# Patient Record
Sex: Male | Born: 1950 | Race: White | Hispanic: No | Marital: Married | Smoking: Former smoker
Health system: Southern US, Community
[De-identification: ages and names within clinical notes are randomized; demographics above are authoritative.]

## PROBLEM LIST (undated history)

## (undated) DIAGNOSIS — C44202 Unspecified malignant neoplasm of skin of right ear and external auricular canal: Secondary | ICD-10-CM

## (undated) DIAGNOSIS — G8929 Other chronic pain: Secondary | ICD-10-CM

## (undated) DIAGNOSIS — E669 Obesity, unspecified: Secondary | ICD-10-CM

## (undated) DIAGNOSIS — I4510 Unspecified right bundle-branch block: Secondary | ICD-10-CM

## (undated) DIAGNOSIS — L309 Dermatitis, unspecified: Secondary | ICD-10-CM

## (undated) DIAGNOSIS — T753XXA Motion sickness, initial encounter: Secondary | ICD-10-CM

## (undated) DIAGNOSIS — R011 Cardiac murmur, unspecified: Secondary | ICD-10-CM

## (undated) DIAGNOSIS — I10 Essential (primary) hypertension: Secondary | ICD-10-CM

## (undated) DIAGNOSIS — G2581 Restless legs syndrome: Secondary | ICD-10-CM

## (undated) DIAGNOSIS — M5134 Other intervertebral disc degeneration, thoracic region: Secondary | ICD-10-CM

## (undated) DIAGNOSIS — G4733 Obstructive sleep apnea (adult) (pediatric): Secondary | ICD-10-CM

## (undated) DIAGNOSIS — Z974 Presence of external hearing-aid: Secondary | ICD-10-CM

## (undated) DIAGNOSIS — G629 Polyneuropathy, unspecified: Secondary | ICD-10-CM

## (undated) DIAGNOSIS — G5600 Carpal tunnel syndrome, unspecified upper limb: Secondary | ICD-10-CM

## (undated) DIAGNOSIS — J309 Allergic rhinitis, unspecified: Secondary | ICD-10-CM

## (undated) DIAGNOSIS — Z8619 Personal history of other infectious and parasitic diseases: Secondary | ICD-10-CM

## (undated) DIAGNOSIS — D696 Thrombocytopenia, unspecified: Secondary | ICD-10-CM

## (undated) DIAGNOSIS — G4730 Sleep apnea, unspecified: Secondary | ICD-10-CM

## (undated) DIAGNOSIS — R0600 Dyspnea, unspecified: Secondary | ICD-10-CM

## (undated) DIAGNOSIS — I209 Angina pectoris, unspecified: Secondary | ICD-10-CM

## (undated) DIAGNOSIS — Z7982 Long term (current) use of aspirin: Secondary | ICD-10-CM

## (undated) DIAGNOSIS — R079 Chest pain, unspecified: Secondary | ICD-10-CM

## (undated) DIAGNOSIS — R5382 Chronic fatigue, unspecified: Secondary | ICD-10-CM

## (undated) DIAGNOSIS — I495 Sick sinus syndrome: Secondary | ICD-10-CM

## (undated) DIAGNOSIS — E785 Hyperlipidemia, unspecified: Secondary | ICD-10-CM

## (undated) DIAGNOSIS — S92151A Displaced avulsion fracture (chip fracture) of right talus, initial encounter for closed fracture: Secondary | ICD-10-CM

## (undated) DIAGNOSIS — J189 Pneumonia, unspecified organism: Secondary | ICD-10-CM

## (undated) DIAGNOSIS — H269 Unspecified cataract: Secondary | ICD-10-CM

## (undated) DIAGNOSIS — M4316 Spondylolisthesis, lumbar region: Secondary | ICD-10-CM

## (undated) DIAGNOSIS — H9190 Unspecified hearing loss, unspecified ear: Secondary | ICD-10-CM

## (undated) DIAGNOSIS — K759 Inflammatory liver disease, unspecified: Secondary | ICD-10-CM

## (undated) DIAGNOSIS — M1990 Unspecified osteoarthritis, unspecified site: Secondary | ICD-10-CM

## (undated) DIAGNOSIS — Z972 Presence of dental prosthetic device (complete) (partial): Secondary | ICD-10-CM

## (undated) DIAGNOSIS — M109 Gout, unspecified: Secondary | ICD-10-CM

## (undated) DIAGNOSIS — R600 Localized edema: Secondary | ICD-10-CM

## (undated) DIAGNOSIS — K219 Gastro-esophageal reflux disease without esophagitis: Secondary | ICD-10-CM

## (undated) DIAGNOSIS — R001 Bradycardia, unspecified: Secondary | ICD-10-CM

## (undated) DIAGNOSIS — R12 Heartburn: Secondary | ICD-10-CM

## (undated) DIAGNOSIS — Z950 Presence of cardiac pacemaker: Secondary | ICD-10-CM

## (undated) DIAGNOSIS — G90529 Complex regional pain syndrome I of unspecified lower limb: Secondary | ICD-10-CM

## (undated) DIAGNOSIS — G47419 Narcolepsy without cataplexy: Secondary | ICD-10-CM

## (undated) DIAGNOSIS — N529 Male erectile dysfunction, unspecified: Secondary | ICD-10-CM

## (undated) HISTORY — DX: Unspecified cataract: H26.9

## (undated) HISTORY — DX: Displaced avulsion fracture (chip fracture) of right talus, initial encounter for closed fracture: S92.151A

## (undated) HISTORY — DX: Gastro-esophageal reflux disease without esophagitis: K21.9

## (undated) HISTORY — DX: Allergic rhinitis, unspecified: J30.9

## (undated) HISTORY — DX: Personal history of other infectious and parasitic diseases: Z86.19

## (undated) HISTORY — DX: Sleep apnea, unspecified: G47.30

## (undated) HISTORY — DX: Morbid (severe) obesity due to excess calories: E66.01

## (undated) HISTORY — DX: Hyperlipidemia, unspecified: E78.5

## (undated) HISTORY — DX: Dermatitis, unspecified: L30.9

## (undated) HISTORY — DX: Obstructive sleep apnea (adult) (pediatric): G47.33

## (undated) HISTORY — PX: INSERT / REPLACE / REMOVE PACEMAKER: SUR710

## (undated) HISTORY — DX: Unspecified osteoarthritis, unspecified site: M19.90

## (undated) HISTORY — DX: Restless legs syndrome: G25.81

## (undated) HISTORY — DX: Complex regional pain syndrome i of unspecified lower limb: G90.529

## (undated) HISTORY — PX: EYE SURGERY: SHX253

## (undated) HISTORY — DX: Gilbert syndrome: E80.4

## (undated) HISTORY — DX: Cardiac murmur, unspecified: R01.1

## (undated) HISTORY — PX: FRACTURE SURGERY: SHX138

## (undated) HISTORY — DX: Narcolepsy without cataplexy: G47.419

## (undated) HISTORY — DX: Chest pain, unspecified: R07.9

## (undated) HISTORY — PX: LEG SURGERY: SHX1003

## (undated) HISTORY — DX: Essential (primary) hypertension: I10

## (undated) HISTORY — DX: Other chronic pain: G89.29

## (undated) HISTORY — DX: Gout, unspecified: M10.9

## (undated) HISTORY — DX: Unspecified hearing loss, unspecified ear: H91.90

## (undated) HISTORY — DX: Male erectile dysfunction, unspecified: N52.9

## (undated) HISTORY — PX: SPINE SURGERY: SHX786

## (undated) HISTORY — DX: Heartburn: R12

---
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
## 1992-07-27 HISTORY — PX: TYMPANIC MEMBRANE REPAIR: SHX294

## 2004-07-27 DIAGNOSIS — G47419 Narcolepsy without cataplexy: Secondary | ICD-10-CM

## 2004-07-27 HISTORY — DX: Narcolepsy without cataplexy: G47.419

## 2005-07-27 DIAGNOSIS — M109 Gout, unspecified: Secondary | ICD-10-CM

## 2005-07-27 HISTORY — DX: Gout, unspecified: M10.9

## 2011-02-25 HISTORY — PX: COLONOSCOPY: SHX174

## 2012-02-09 LAB — COMPREHENSIVE METABOLIC PANEL
ALT: 52 U/L — AB (ref 10–40)
AST: 32 U/L
Alkaline Phosphatase: 48 U/L
CREATININE: 1.06
Glucose: 103
POTASSIUM: 4 mmol/L
Sodium: 143 mmol/L (ref 137–147)
Total Bilirubin: 0.6 mg/dL

## 2012-02-09 LAB — PSA: PSA: 0.26

## 2012-02-09 LAB — URIC ACID: Uric Acid: 5.9

## 2012-02-09 LAB — LIPID PANEL
Cholesterol, Total: 130
HDL: 55 mg/dL (ref 35–70)
LDL (calc): 47
Triglycerides: 142

## 2012-02-09 LAB — TSH: TSH: 1.8

## 2012-08-02 ENCOUNTER — Ambulatory Visit (INDEPENDENT_AMBULATORY_CARE_PROVIDER_SITE_OTHER): Payer: Medicare Other | Admitting: Family Medicine

## 2012-08-02 ENCOUNTER — Telehealth: Payer: Self-pay

## 2012-08-02 ENCOUNTER — Encounter: Payer: Self-pay | Admitting: Family Medicine

## 2012-08-02 VITALS — BP 158/80 | HR 64 | Temp 97.7°F | Ht 66.5 in | Wt 234.0 lb

## 2012-08-02 DIAGNOSIS — G4733 Obstructive sleep apnea (adult) (pediatric): Secondary | ICD-10-CM | POA: Insufficient documentation

## 2012-08-02 DIAGNOSIS — G2581 Restless legs syndrome: Secondary | ICD-10-CM

## 2012-08-02 DIAGNOSIS — F1190 Opioid use, unspecified, uncomplicated: Secondary | ICD-10-CM | POA: Insufficient documentation

## 2012-08-02 DIAGNOSIS — G8929 Other chronic pain: Secondary | ICD-10-CM

## 2012-08-02 DIAGNOSIS — E785 Hyperlipidemia, unspecified: Secondary | ICD-10-CM

## 2012-08-02 DIAGNOSIS — I10 Essential (primary) hypertension: Secondary | ICD-10-CM | POA: Insufficient documentation

## 2012-08-02 DIAGNOSIS — K219 Gastro-esophageal reflux disease without esophagitis: Secondary | ICD-10-CM | POA: Insufficient documentation

## 2012-08-02 DIAGNOSIS — Z23 Encounter for immunization: Secondary | ICD-10-CM

## 2012-08-02 DIAGNOSIS — J209 Acute bronchitis, unspecified: Secondary | ICD-10-CM

## 2012-08-02 DIAGNOSIS — F1120 Opioid dependence, uncomplicated: Secondary | ICD-10-CM | POA: Insufficient documentation

## 2012-08-02 DIAGNOSIS — R011 Cardiac murmur, unspecified: Secondary | ICD-10-CM

## 2012-08-02 MED ORDER — FENOFIBRATE 145 MG PO TABS: 145.0000 mg | ORAL_TABLET | Freq: Every day | ORAL | Status: DC

## 2012-08-02 MED ORDER — LISINOPRIL-HYDROCHLOROTHIAZIDE 20-12.5 MG PO TABS: 1.0000 | ORAL_TABLET | Freq: Two times a day (BID) | ORAL | Status: DC

## 2012-08-02 MED ORDER — AZITHROMYCIN 250 MG PO TABS: ORAL_TABLET | ORAL | Status: DC

## 2012-08-02 MED ORDER — HYDROCODONE-HOMATROPINE 5-1.5 MG/5ML PO SYRP: 5.0000 mL | ORAL_SOLUTION | Freq: Every evening | ORAL | Status: DC | PRN

## 2012-08-02 MED ORDER — PRAVASTATIN SODIUM 40 MG PO TABS: 40.0000 mg | ORAL_TABLET | Freq: Every day | ORAL | Status: DC

## 2012-08-02 NOTE — Addendum Note (Signed)
Addended by: Eliezer Bottom on: 08/02/2012 04:18 PM   Modules accepted: Orders

## 2012-08-02 NOTE — Progress Notes (Signed)
Subjective:    Patient ID: Randy Kennedy, male    DOB: 12-07-1950, 62 y.o.   MRN: [id number]  HPI CC: new pt to establish  Recently moved from [place] 2 mo ago.  Prior PCP Dr. Maisie Fus in [place].  HTN - takes lisinopril and nifedipine daily.  Off chlorthalidone 2/2 dry mouth.  Could not tolerate BiPAp with dry mouth.  Hospitalized 03/2012 for hypertensive urgency.    HLD - compliant with pravastatin and fenofibrate.  Recent cold - daughter came to visit from [place], thinks daughter gave it to him.  sxs since 07/23/2012.  + coughing, sneezing, fevers, aches, blocked ears, chest congestion and head congestion, PNDrainage.  Cough started last night, seems to be getting worse.  Productive of green mucous this morning. So far has taken cold ease and alka seltzer cold. No smokers at home.  No h/o asthma/COPD. No HA, ear or tooth pain.  Chronic lower back pain - s/p injury 35 yrs ago when feel 3.5 stories (was Product/process development scientist).  Fentanyl - caused irritability.  Changed to hydrocodone 10/325.  Takes this per neurologist for h/o back pain, leg pain and RLS.  Has been on narcotics for last 20 years.  This was prescribed by neurology in past.  Has had multiple sleep studies and EMGs in past.  L ankle fractured, back fractured.  Has been on requip and mirapex in past.  Caffeine: 2 cans coke/day Lives with wife and grown son  Occupation: retired, was Administrator, arts.   On disability for chronic pain Edu: MBA Activity: no regular exercise Deit: good water, fruits/vegetables daily  Preventative: Last CPE 03/2012 Colonoscopy 03/2012 Flu shot - would like today. Tetanus shot - unsure  Medications and allergies reviewed and updated in chart.  Past histories reviewed and updated if relevant as below. There is no problem list on file for this patient.  Past Medical History  Diagnosis Date  . GERD (gastroesophageal reflux disease)   . Arthritis   . History of hepatitis B     as  child, no sequelae  . HTN (hypertension)   . HLD (hyperlipidemia)   . History of chicken pox   . Gilbert disease   . OSA (obstructive sleep apnea)     on BiPAP   Past Surgical History  Procedure Date  . Leg surgery x5    left - after fall at work (3.5 stories)  . 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 Mandible surgery 1965    jaw fracture - horse kick   History  Substance Use Topics  . Smoking status: Former Smoker    Types: Pipe    Quit date: 07/27/1978  . Smokeless tobacco: Never Used  . Alcohol Use: No   Family History  Problem Relation Age of Onset  . Cancer Father     prostate  . Cancer Paternal Uncle     prostate  . Cancer Mother     rectal  . Hypertension Mother   . Diabetes Neg Hx   . Stroke Neg Hx   . CAD Neg Hx    No Known Allergies Current Outpatient Prescriptions on File Prior to Visit  Medication Sig Dispense Refill  . famotidine (PEPCID) 20 MG tablet Take 20 mg by mouth 2 (two) times daily as needed.      . Choline Fenofibrate (TRILIPIX) 135 MG capsule Take 135 mg by mouth at bedtime.       Marland Kitchen lisinopril-hydrochlorothiazide (PRINZIDE,ZESTORETIC) 20-12.5 MG per tablet Take  1 tablet by mouth 2 (two) times daily.  90 tablet  3  . NIFEdipine (PROCARDIA-XL/ADALAT CC) 60 MG 24 hr tablet Take 60 mg by mouth daily.      . pravastatin (PRAVACHOL) 40 MG tablet Take 40 mg by mouth at bedtime.         Review of Systems  Constitutional: Positive for fever. Negative for chills, activity change, appetite change, fatigue and unexpected weight change.  HENT: Positive for congestion, rhinorrhea and sneezing. Negative for hearing loss and neck pain.   Eyes: Negative for visual disturbance.  Respiratory: Positive for cough and wheezing. Negative for chest tightness and shortness of breath.   Cardiovascular: Negative for chest pain, palpitations and leg swelling.  Gastrointestinal: Negative for nausea, vomiting, abdominal pain, diarrhea, constipation, blood in  stool and abdominal distention.  Genitourinary: Negative for hematuria and difficulty urinating.  Musculoskeletal: Negative for myalgias and arthralgias.  Skin: Negative for rash.  Neurological: Negative for dizziness, seizures, syncope and headaches.  Hematological: Does not bruise/bleed easily.  Psychiatric/Behavioral: Negative for dysphoric mood. The patient is not nervous/anxious.        Objective:   Physical Exam  Nursing note and vitals reviewed. Constitutional: He is oriented to person, place, and time. He appears well-developed and well-nourished. No distress.  HENT:  Head: Normocephalic and atraumatic.  Right Ear: Hearing, tympanic membrane, external ear and ear canal normal.  Left Ear: Hearing, tympanic membrane, external ear and ear canal normal.  Nose: Nose normal. No mucosal edema or rhinorrhea. Right sinus exhibits no maxillary sinus tenderness and no frontal sinus tenderness. Left sinus exhibits no maxillary sinus tenderness and no frontal sinus tenderness.  Mouth/Throat: Uvula is midline, oropharynx is clear and moist and mucous membranes are normal. No oropharyngeal exudate, posterior oropharyngeal edema, posterior oropharyngeal erythema or tonsillar abscesses.  Eyes: Conjunctivae normal and EOM are normal. Pupils are equal, round, and reactive to light. No scleral icterus.  Neck: Normal range of motion. Neck supple. Carotid bruit is not present.  Cardiovascular: Normal rate, regular rhythm and intact distal pulses.   Murmur (2/6 SEM) heard. Pulses:      Radial pulses are 2+ on the right side, and 2+ on the left side.  Pulmonary/Chest: Effort normal and breath sounds normal. No respiratory distress. He has no wheezes. He has no rales.       Lungs clear  Abdominal: Soft. Bowel sounds are normal. He exhibits no distension and no mass. There is no tenderness. There is no rebound and no guarding.  Musculoskeletal: Normal range of motion. He exhibits no edema.    Lymphadenopathy:    He has no cervical adenopathy.  Neurological: He is alert and oriented to person, place, and time.       CN grossly intact, station and gait intact  Skin: Skin is warm and dry. No rash noted.  Psychiatric: He has a normal mood and affect. His behavior is normal. Judgment and thought content normal.       Assessment & Plan:

## 2012-08-02 NOTE — Assessment & Plan Note (Signed)
Reports compliance

## 2012-08-02 NOTE — Assessment & Plan Note (Signed)
L ankle pain s/p trauma.  Takes hydrocodone for last 20 yrs.  Discussed concern with sedation. If desires me to refill pain med, will need to sign pain contract with [place].

## 2012-08-02 NOTE — Assessment & Plan Note (Signed)
Very bothersome. Has tried mirapex and requip in past. Currently using hydrocodone for this.

## 2012-08-02 NOTE — Telephone Encounter (Signed)
sent in new #

## 2012-08-02 NOTE — Assessment & Plan Note (Signed)
Longstanding.  Will try and simplify regimen by combining lisinopril with hctz CTD caused dry mouth. rtc 4-6 mo for recheck and blood work then.

## 2012-08-02 NOTE — Assessment & Plan Note (Signed)
Per pt lonstanding.  Will monitor for now.

## 2012-08-02 NOTE — Assessment & Plan Note (Signed)
Chronic, stable. Compliant with meds - check FLP next blood draw.

## 2012-08-02 NOTE — Telephone Encounter (Signed)
[facility] [address] request clarification on quantity or instructions of lisinopril hctz. [facility] received Lisinopril HCTZ 20-12.5 mg with instructions take one tablet by mouth twice a day quantity of # 90. Should quantity be # 180 or should instructions be take one daily.Please advise.

## 2012-08-02 NOTE — Patient Instructions (Addendum)
Start baby aspirin. Stop chlorthalidone and stop lisinopril. Start combo pill lisinopril hctz twice daily. Flu shot today. I do think you are developing bronchitis, but likely viral.   Supportive care for now. If worsening productive cough, or fever >101, fill medication as prescribed: zpack. Simple mucinex with plenty of water to mobilize mucous. Push fluids and plenty of rest. Please return if you are not improving as expected, or if you have high fevers (>101.5) or difficulty swallowing or worsening productive cough. Call clinic with questions.  Good to see you today.

## 2012-08-29 ENCOUNTER — Telehealth: Payer: Self-pay | Admitting: Family Medicine

## 2012-08-29 MED ORDER — COLCHICINE 0.6 MG PO TABS: 0.6000 mg | ORAL_TABLET | Freq: Every day | ORAL | Status: DC

## 2012-08-29 NOTE — Telephone Encounter (Signed)
plz notify colchicine called in - to take 1 pill twice daily as needed.  If not better, will need to be seen. I did not know he had gout- definitely needs to be seen if not improved.

## 2012-08-29 NOTE — Telephone Encounter (Signed)
Patient Information:  Caller Name: Travers  Phone: [phone]  Patient: Randy Kennedy, Randy Kennedy  Gender: Male  DOB: 1950-10-19  Age: 62 Years  PCP: Eustaquio Boy[facility])  Office Follow Up:  Does the office need to follow up with this patient?: Yes  Instructions For The Office: Please call him back to let him know if med for Gour called in. He uses cane for injury in other foot and is having trouble walking. Refused appointment   Symptoms  Reason For Call & Symptoms: Started with pain in R big toe onset 08/27/12. Toes is red and swollen at first knuckle, and feels warm to touch. Hurts to walk on foot. Last Gout flair up was 2 years ago. Requesting med to treat Gout be called into [facility] on [address]. Allipurinol bothers his stomach but he will take it if no other options.  Reviewed Health History In EMR: Yes  Reviewed Medications In EMR: Yes  Reviewed Allergies In EMR: Yes  Reviewed Surgeries / Procedures: Yes  Date of Onset of Symptoms: 08/27/2012  Guideline(s) Used:  Foot Pain  Disposition Per Guideline:   See Within 3 Days in Office  Reason For Disposition Reached:   Pain in the big toe joint  Advice Given:  Call Back If:  Swelling, redness, or fever occur  Severe pain not relieved by pain medication  Pain lasts over 7 days  You become worse.  Patient Refused Recommendation:  Patient Requests Prescription  Requesting medication for Gout Flair up

## 2012-08-29 NOTE — Telephone Encounter (Signed)
Patient advised.

## 2012-09-13 ENCOUNTER — Telehealth: Payer: Self-pay | Admitting: Family Medicine

## 2012-09-13 ENCOUNTER — Encounter: Payer: Self-pay | Admitting: Family Medicine

## 2012-09-13 ENCOUNTER — Ambulatory Visit (INDEPENDENT_AMBULATORY_CARE_PROVIDER_SITE_OTHER): Payer: Medicare Other | Admitting: Family Medicine

## 2012-09-13 ENCOUNTER — Encounter: Payer: Self-pay | Admitting: *Deleted

## 2012-09-13 VITALS — BP 140/80 | HR 60 | Temp 98.1°F | Wt 226.8 lb

## 2012-09-13 DIAGNOSIS — R208 Other disturbances of skin sensation: Secondary | ICD-10-CM

## 2012-09-13 DIAGNOSIS — I10 Essential (primary) hypertension: Secondary | ICD-10-CM

## 2012-09-13 DIAGNOSIS — R209 Unspecified disturbances of skin sensation: Secondary | ICD-10-CM

## 2012-09-13 DIAGNOSIS — N529 Male erectile dysfunction, unspecified: Secondary | ICD-10-CM

## 2012-09-13 DIAGNOSIS — G8929 Other chronic pain: Secondary | ICD-10-CM

## 2012-09-13 DIAGNOSIS — R202 Paresthesia of skin: Secondary | ICD-10-CM

## 2012-09-13 DIAGNOSIS — M109 Gout, unspecified: Secondary | ICD-10-CM

## 2012-09-13 LAB — BASIC METABOLIC PANEL
BUN: 22 mg/dL (ref 6–23)
CO2: 28 mEq/L (ref 19–32)
Calcium: 10.1 mg/dL (ref 8.4–10.5)
Chloride: 105 mEq/L (ref 96–112)
Creatinine, Ser: 1.3 mg/dL (ref 0.4–1.5)
GFR: 58.46 mL/min — ABNORMAL LOW (ref >60.00)
Glucose, Bld: 99 mg/dL (ref 70–99)
Potassium: 3.7 mEq/L (ref 3.5–5.1)
Sodium: 142 mEq/L (ref 135–145)

## 2012-09-13 LAB — VITAMIN B12: Vitamin B-12: 443 pg/mL (ref 211–911)

## 2012-09-13 LAB — FOLATE: Folate: 19.8 ng/mL (ref >5.9)

## 2012-09-13 MED ORDER — LISINOPRIL 20 MG PO TABS: 20.0000 mg | ORAL_TABLET | Freq: Two times a day (BID) | ORAL | Status: DC

## 2012-09-13 MED ORDER — SILDENAFIL CITRATE 100 MG PO TABS
50.0000 mg | ORAL_TABLET | Freq: Every day | ORAL | Status: DC | PRN
Start: 2012-09-13 — End: 2013-02-16

## 2012-09-13 MED ORDER — NIFEDIPINE ER 90 MG PO TB24: 90.0000 mg | ORAL_TABLET | Freq: Every day | ORAL | Status: DC

## 2012-09-13 MED ORDER — NIFEDIPINE ER 60 MG PO TB24: 60.0000 mg | ORAL_TABLET | Freq: Every day | ORAL | Status: DC

## 2012-09-13 MED ORDER — HYDROCODONE-ACETAMINOPHEN 10-325 MG PO TABS: 1.0000 | ORAL_TABLET | Freq: Four times a day (QID) | ORAL | Status: DC | PRN

## 2012-09-13 NOTE — Patient Instructions (Addendum)
Trial of viagra. Increase nifedipine to 90mg  XR daily Change lisinopril/hctz to lisinopril only. If continued gout flares, we may discuss starting daily medicine to lower uric acid level.  Blood work today to check on burning in feet as well as kidney function - if normal, may try gabapentin course. Pain med refilled today.  Sign pain contract form today. Good to see you today, return in 2-3 months for follow up.

## 2012-09-13 NOTE — Telephone Encounter (Signed)
[facility] [place] called regarding new RX for Lisinopril 20mg .  They stated that pt had Lisinopril-HCTZ filled on 08/02/12 and wanted to confirm whether Dr Sharen Hones wanted to d/c the Lisinopril-HCTZ or just add the new Lisinopril 20mg  RX in addition to the other.  Office note confirms that Dr Sharen Hones wants to d/c Lisinopril-HCTZ.  Confirmed information with Jacki Cones at [facility].

## 2012-09-13 NOTE — Progress Notes (Signed)
  Subjective:    Patient ID: Randy Kennedy, male    DOB: [birth year], 62 y.o.   MRN: [id number]  HPI CC: f/u gout  Last month BP regimen changed with addition of HCTZ to lisinopril as well as continued nifedipine for better BP control.    Gout flare started beginning of Feb.  R 1st MTP pain/swelling.  No left foot pain.  Foot was hot, red, swollen very tender to touch.  Treated with commencement of colchicine.  Allopurinol caused stomach upset in past.  Has been drinking black cherry juice.  Resolving well with colchicine.  Had not mentioned h/o gout last visit.  Also has noticing worsening pain in feet over last several months - describes burning pain worse when on his feet prolonged periods.  Mild paresthesias.  Notices working in garage for a few hours worsens foot pain.  No pain in am.  Worse as day progresses.  H/o calluses on feet.  Denies h/o DM.  Denies EtOH intake.  Some ED - going on for last 3-4 yrs.  Trouble maintaining erection.  Has not used viagra in past.  interested in trial of this. Saw cardiologist in past - told heart normal per patient.   Past Medical History  Diagnosis Date  . GERD (gastroesophageal reflux disease)     mild, controlled with pepcid  . Arthritis   . 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 HTN (hypertension)   . HLD (hyperlipidemia)   . History of chicken pox   . Gilbert disease   . OSA (obstructive sleep apnea)     on BiPAP  . RLS (restless legs syndrome)   . Chronic pain   . Cardiac murmur   . Gout 2007    Past Surgical History  Procedure Laterality Date  . Leg surgery  x5    left - after fall at work (3.5 stories)  . Tympanic membrane repair  1994    otosclerosis  . Mandible surgery  1965    jaw fracture - horse kick   Review of Systems Per HPI    Objective:   Physical Exam  Nursing note and vitals reviewed. Constitutional: He appears well-developed and well-nourished. No distress.  Musculoskeletal: He exhibits  no edema.  Foot exam: Normal inspection No skin breakdown + calluses on bilateral soles Normal DP/PT pulses Normal sensation to light touch and slightly diminished sensation L sole to monofilament Nails normal R 1st MTP still mildly tender to touch, mild edema and erythema.       Assessment & Plan:

## 2012-09-14 ENCOUNTER — Encounter: Payer: Self-pay | Admitting: Family Medicine

## 2012-09-14 ENCOUNTER — Other Ambulatory Visit: Payer: Self-pay | Admitting: Family Medicine

## 2012-09-14 DIAGNOSIS — N529 Male erectile dysfunction, unspecified: Secondary | ICD-10-CM | POA: Insufficient documentation

## 2012-09-14 HISTORY — DX: Male erectile dysfunction, unspecified: N52.9

## 2012-09-14 MED ORDER — GABAPENTIN 300 MG PO CAPS: 300.0000 mg | ORAL_CAPSULE | Freq: Every day | ORAL | Status: DC

## 2012-09-14 NOTE — Assessment & Plan Note (Signed)
With recent flare after starting HCTZ. Will d/c HCTZ and instead increase nifedipine to 90mg  XR daily. Low HR limits B blocker. Will also avoid other diuretics given h/o gout.  Prior on colchicine. Discussed if recurrent flares - would recommend daily medication - likely uloric as allopurinol caused stomach upset in past.

## 2012-09-14 NOTE — Assessment & Plan Note (Signed)
Start viagra.  Discussed precautions including HA, priapism, avoidance of nitro.

## 2012-09-14 NOTE — Assessment & Plan Note (Signed)
Bilateral. Not consistent with plantar fasciitis. ?periph neuropathy - check vitamin levels. Discussed gabapentin use - pt has been on this in past, for RLS, didn't feel helped sxs. If all normal blood work, consider re-trial of gabapentin.

## 2012-09-14 NOTE — Assessment & Plan Note (Signed)
HCTZ may have precipitated gout flare.  Stop med. Increase nifedipine. BP Readings from Last 3 Encounters:  09/13/12 140/80  08/02/12 158/80

## 2012-09-14 NOTE — Assessment & Plan Note (Signed)
L ankle pain s/p trauma.  On hydrocodone for last 20 yrs. We will start filling pain medication - pain contract filled out today. Discussed expectations for controlled substances.

## 2012-09-26 ENCOUNTER — Other Ambulatory Visit: Payer: Self-pay | Admitting: *Deleted

## 2012-09-26 NOTE — Telephone Encounter (Signed)
Pt is requesting refill for gout flare up. Is it ok to refill?

## 2012-09-27 MED ORDER — COLCHICINE 0.6 MG PO TABS: 0.6000 mg | ORAL_TABLET | Freq: Two times a day (BID) | ORAL | Status: DC | PRN

## 2012-09-27 NOTE — Telephone Encounter (Signed)
lmom with instruction for pt

## 2012-09-27 NOTE — Telephone Encounter (Signed)
plz notify sent in. If recurrent gout flares, would start daily gout medicine.  Will discuss at f/u visit.

## 2012-10-05 ENCOUNTER — Telehealth: Payer: Self-pay

## 2012-10-05 MED ORDER — GABAPENTIN 300 MG PO CAPS: 300.0000 mg | ORAL_CAPSULE | Freq: Three times a day (TID) | ORAL | Status: DC

## 2012-10-05 NOTE — Telephone Encounter (Signed)
Pt left v/m;pt started Gabapentin 300 mg couple of weeks ago for nerve pain on bottom of feet; first 2 days seemed to help but now pt has no relief from neuro pain on bottom of feet. Pt wants to know if needs to increase med or should pt stop med since no longer helping.Please advise. [facility] [address]

## 2012-10-05 NOTE — Telephone Encounter (Signed)
Let's increase gabapentin to twice daily for the next week, if not improved, may increase to three times daily.

## 2012-10-06 NOTE — Telephone Encounter (Signed)
Patient notified

## 2012-10-10 ENCOUNTER — Telehealth: Payer: Self-pay

## 2012-10-10 NOTE — Telephone Encounter (Signed)
Pt left v/m that he read that a side effect of fenofibrate could cause neuropathy. Pt has recently had to start Gabapentin for neuropathy. Pt wants to know if could stop fenofibrate and get substitute med for fenofibrate. [facility] [address]Please advise.

## 2012-10-10 NOTE — Telephone Encounter (Signed)
I believe he was on fenofibrate prior to starting to see me. May do several week trial off fenofibrate and see if neuropathy improves. Continue pravastatin for cholesterol levels. Will need cholesterol checked in near future as I have no records of latest check.

## 2012-10-11 NOTE — Telephone Encounter (Signed)
Message left advising patient to hold fenofibrate for now and to continue pravastatin. Advised to call and schedule fasting labs within the next 3-4 weeks.

## 2012-10-18 ENCOUNTER — Other Ambulatory Visit: Payer: Self-pay

## 2012-10-18 MED ORDER — HYDROCODONE-ACETAMINOPHEN 10-325 MG PO TABS: 1.0000 | ORAL_TABLET | Freq: Four times a day (QID) | ORAL | Status: DC | PRN

## 2012-10-18 NOTE — Telephone Encounter (Signed)
plz phone in and notify pt. 

## 2012-10-18 NOTE — Telephone Encounter (Signed)
Rx called in as directed.   

## 2012-10-18 NOTE — Telephone Encounter (Signed)
Pt left v/m requesting refill hydrocodone apap to [facility] [address]. Pt still having  ongoing pain due to leg injury.Please advise.

## 2012-10-26 ENCOUNTER — Other Ambulatory Visit: Payer: Self-pay

## 2012-10-26 MED ORDER — COLCHICINE 0.6 MG PO TABS: 0.6000 mg | ORAL_TABLET | Freq: Two times a day (BID) | ORAL | Status: DC | PRN

## 2012-10-26 NOTE — Telephone Encounter (Signed)
Pt request refill colchicine to [facility] [address]; pt said recent gout flare up resolved and 10/25/12 right big toe became swollen,red and painful. Per 09/26/12 phone note if pt has recurrent gout flare ups would prescribe daily goit med and would discuss at f/u appt. Pt said he does not have f/u appt scheduled.Please advise.

## 2012-10-26 NOTE — Telephone Encounter (Signed)
plz notify colchicine refill sent in i'd like to see him in next 2-3 months for f/u

## 2012-10-27 NOTE — Telephone Encounter (Signed)
Message left advising patient. Instructed to call and schedule follow up for 2-3 months.

## 2012-11-02 ENCOUNTER — Other Ambulatory Visit (INDEPENDENT_AMBULATORY_CARE_PROVIDER_SITE_OTHER): Payer: Medicare Other

## 2012-11-02 DIAGNOSIS — E785 Hyperlipidemia, unspecified: Secondary | ICD-10-CM

## 2012-11-02 LAB — LIPID PANEL
Cholesterol: 147 mg/dL (ref 0–200)
LDL Cholesterol: 75 mg/dL (ref 0–99)
Total CHOL/HDL Ratio: 3

## 2012-11-04 ENCOUNTER — Encounter: Payer: Self-pay | Admitting: *Deleted

## 2012-11-08 ENCOUNTER — Telehealth: Payer: Self-pay | Admitting: Family Medicine

## 2012-11-08 NOTE — Telephone Encounter (Signed)
Patient's spouse brought in a Handicapped Placard form to be filled out for [employer].  The form is in your in box.

## 2012-11-10 NOTE — Telephone Encounter (Signed)
Filled and placed in kim's box. 

## 2012-11-10 NOTE — Telephone Encounter (Signed)
Pt called back due to missed call and Patient notified as instructed by telephone.

## 2012-11-10 NOTE — Telephone Encounter (Signed)
Left message for patient letting him know that his paperwork is ready for pickup and placed up front.

## 2012-11-14 ENCOUNTER — Telehealth: Payer: Self-pay

## 2012-11-14 MED ORDER — HYDROCODONE-ACETAMINOPHEN 10-325 MG PO TABS: 1.0000 | ORAL_TABLET | Freq: Four times a day (QID) | ORAL | Status: DC | PRN

## 2012-11-14 NOTE — Telephone Encounter (Signed)
Pt picked up temporary handicap placard form; pt has not had temporary placard in 25 years and wants to know why was changed from permanent to temp handicap placard.(only issued for few months; pt has crushed left leg due to a fall in 1986.  Pt request refill hydrocodone apap to [facility] [address]. If pt changes to another physician what would happen with form signed that pt would only get pain med thru Dr Reece Agar.Please advise.

## 2012-11-14 NOTE — Telephone Encounter (Signed)
I usually start out with temporary placard - esp if we have not discussed at office visit in detail.  Given his history, ok to do permanent placard.  I've filled out permanent placard and placed in kim's box. plz phone in refill. If pt desires switch in PCPs, would need to discuss narcotics with new provider and set up new pain contract.

## 2012-11-15 NOTE — Telephone Encounter (Signed)
Patient notified. New placard placed up front for pick up. Rx called in as directed.

## 2012-12-08 ENCOUNTER — Other Ambulatory Visit: Payer: Self-pay

## 2012-12-08 MED ORDER — HYDROCODONE-ACETAMINOPHEN 10-325 MG PO TABS: 1.0000 | ORAL_TABLET | Freq: Four times a day (QID) | ORAL | Status: DC | PRN

## 2012-12-08 NOTE — Telephone Encounter (Signed)
Pt request refill hydrocodone apap to [facility] [address]. Pt still having leg and arm pain;said neurological pain.Please advise.

## 2012-12-08 NOTE — Telephone Encounter (Signed)
plz phone in and notify patient.  Request is a bit early - last filled 11/14/2012.  Watch use of narcotics, don't use more than 3 per day.  Only use as needed.

## 2012-12-09 ENCOUNTER — Other Ambulatory Visit: Payer: Self-pay | Admitting: Family Medicine

## 2012-12-09 NOTE — Telephone Encounter (Signed)
Advised patient as instructed, medicine called to [facility] [address].

## 2012-12-22 ENCOUNTER — Other Ambulatory Visit: Payer: Self-pay

## 2012-12-22 MED ORDER — COLCHICINE 0.6 MG PO TABS: 0.6000 mg | ORAL_TABLET | Freq: Two times a day (BID) | ORAL | Status: DC | PRN

## 2012-12-22 NOTE — Telephone Encounter (Signed)
Pt said rt big toe is red, feels hot to touch; pt said gout starting again and request refill on colchicine to [facility] [address]Please advise.

## 2012-12-22 NOTE — Telephone Encounter (Signed)
Sent in. plz notify patient - and ask him to come in if not improving with colchicine.  I could see Friday if needed.

## 2012-12-23 NOTE — Telephone Encounter (Signed)
Patient notified

## 2013-01-06 ENCOUNTER — Other Ambulatory Visit: Payer: Self-pay

## 2013-01-06 MED ORDER — HYDROCODONE-ACETAMINOPHEN 10-325 MG PO TABS: 1.0000 | ORAL_TABLET | Freq: Four times a day (QID) | ORAL | Status: DC | PRN

## 2013-01-06 NOTE — Telephone Encounter (Signed)
Pt left v/m requesting refill hydrocodone apap.Please advise.

## 2013-01-06 NOTE — Telephone Encounter (Signed)
plz phone in. 

## 2013-01-06 NOTE — Telephone Encounter (Signed)
Pt called back status of refill; med called to [facility] [address] left on v/m and left v/m for pt refill was done.

## 2013-01-31 ENCOUNTER — Other Ambulatory Visit: Payer: Self-pay

## 2013-01-31 NOTE — Telephone Encounter (Signed)
Pt left v/m requesting refill hydrocodone apap to [facility] [address]. Please advise.

## 2013-02-01 MED ORDER — HYDROCODONE-ACETAMINOPHEN 10-325 MG PO TABS: 1.0000 | ORAL_TABLET | Freq: Four times a day (QID) | ORAL | Status: DC | PRN

## 2013-02-01 NOTE — Telephone Encounter (Signed)
plz phone in. 

## 2013-02-01 NOTE — Telephone Encounter (Signed)
Rx called in as directed.   

## 2013-02-15 ENCOUNTER — Encounter: Payer: Self-pay | Admitting: *Deleted

## 2013-02-16 ENCOUNTER — Encounter: Payer: Self-pay | Admitting: Cardiovascular Disease

## 2013-02-16 ENCOUNTER — Ambulatory Visit (INDEPENDENT_AMBULATORY_CARE_PROVIDER_SITE_OTHER): Payer: Medicare Other | Admitting: Cardiovascular Disease

## 2013-02-16 VITALS — BP 140/82 | HR 49 | Ht 67.0 in | Wt 232.5 lb

## 2013-02-16 DIAGNOSIS — R001 Bradycardia, unspecified: Secondary | ICD-10-CM | POA: Insufficient documentation

## 2013-02-16 DIAGNOSIS — R079 Chest pain, unspecified: Secondary | ICD-10-CM

## 2013-02-16 DIAGNOSIS — R0789 Other chest pain: Secondary | ICD-10-CM | POA: Insufficient documentation

## 2013-02-16 DIAGNOSIS — I498 Other specified cardiac arrhythmias: Secondary | ICD-10-CM

## 2013-02-16 DIAGNOSIS — R0602 Shortness of breath: Secondary | ICD-10-CM

## 2013-02-16 DIAGNOSIS — E785 Hyperlipidemia, unspecified: Secondary | ICD-10-CM

## 2013-02-16 DIAGNOSIS — I10 Essential (primary) hypertension: Secondary | ICD-10-CM

## 2013-02-16 DIAGNOSIS — R609 Edema, unspecified: Secondary | ICD-10-CM

## 2013-02-16 DIAGNOSIS — R600 Localized edema: Secondary | ICD-10-CM | POA: Insufficient documentation

## 2013-02-16 MED ORDER — HYDRALAZINE HCL 50 MG PO TABS: 50.0000 mg | ORAL_TABLET | Freq: Three times a day (TID) | ORAL | Status: DC

## 2013-02-16 MED ORDER — NITROGLYCERIN 0.4 MG SL SUBL: 0.4000 mg | SUBLINGUAL_TABLET | SUBLINGUAL | Status: DC | PRN

## 2013-02-16 NOTE — Progress Notes (Signed)
Patient ID: Randy Kennedy, male    DOB: 08/25/1950, 62 y.o.   MRN: [id number]  HPI Comments: [name] is a very pleasant 62 year old gentleman with a history of RLS, chronic burning in his feet/neuropathy, hypertension, hyperlipidemia, structures sleep apnea who wears BiPAP, who presents by referral for evaluation of chest tightness. He has baseline bradycardia, on calcium channel blockers  Reports that he had a negative stress test in 2013. This was a pharmacologic Myoview dated May 2013. He was unable to treadmill He was seen previously by cardiology in [place]. Notes indicate history of difficult to control blood pressure, hyperlipidemia, obesity, disabled. No smoking history He was started several years on Procardia XL 30 mg daily. This was increased in [place]  Reports having episode of chest tightness while he was swimming in a poor. He had to hold onto the side of the pole. Mild dizziness. He has not been back to the pole since that time. He takes one half chlorthalidone for lower extremity edema. Edema is bilateral, worse on the left after our accident. He reports that his blood pressure is typically in the 130-160 range systolic, diastolics sometimes up to the 90s He is very bothered by fatigue  Notes from prior cardiologist indicate previous echocardiogram showing diastolic dysfunction  Grandfather died of a heart attack at 54, father doing well in his 57s as well as his mother  EKG shows sinus bradycardia with rate 49 beats per minute, no significant ST or T wave changes   Outpatient Encounter Prescriptions as of 02/16/2013  Medication Sig Dispense Refill  . aspirin EC 81 MG tablet Take 81 mg by mouth daily.      . chlorthalidone (HYGROTON) 25 MG tablet Take 12.5 mg by mouth daily.       . colchicine 0.6 MG tablet Take 1 tablet (0.6 mg total) by mouth 2 (two) times daily as needed (gout flare).  60 tablet  0  . famotidine (PEPCID) 20 MG tablet Take 20 mg by mouth 2 (two)  times daily as needed.      Marland Kitchen HYDROcodone-acetaminophen (NORCO) 10-325 MG per tablet Take 1 tablet by mouth every 6 (six) hours as needed for pain.  90 tablet  0  . lisinopril (PRINIVIL,ZESTRIL) 20 MG tablet Take 1 tablet (20 mg total) by mouth 2 (two) times daily.  180 tablet  3  . pravastatin (PRAVACHOL) 40 MG tablet Take 1 tablet (40 mg total) by mouth at bedtime.  90 tablet  3  . tobramycin-dexamethasone (TOBRADEX) ophthalmic solution Place 1 drop into the left eye every 4 (four) hours while awake.       Marland Kitchen NIFEdipine (ADALAT CC) 90 MG 24 hr tablet Take 1 tablet (90 mg total) by mouth daily.  90 tablet  3    Review of Systems  Constitutional: Positive for fatigue.  HENT: Negative.   Eyes: Negative.   Respiratory: Negative.   Cardiovascular: Positive for leg swelling.  Gastrointestinal: Negative.   Musculoskeletal: Positive for gait problem.  Skin: Negative.   Neurological: Negative.   Psychiatric/Behavioral: Negative.   All other systems reviewed and are negative.    BP 140/82  Pulse 49  Ht 5\' 7"  (1.702 m)  Wt 232 lb 8 oz (105.461 kg)  BMI 36.41 kg/m2  Physical Exam  Nursing note and vitals reviewed. Constitutional: He is oriented to person, place, and time. He appears well-developed and well-nourished.  HENT:  Head: Normocephalic.  Nose: Nose normal.  Mouth/Throat: Oropharynx is clear and moist.  Eyes:  Conjunctivae are normal. Pupils are equal, round, and reactive to light.  Neck: Normal range of motion. Neck supple. No JVD present.  Cardiovascular: Normal rate, regular rhythm, S1 normal, S2 normal, normal heart sounds and intact distal pulses.  Exam reveals no gallop and no friction rub.   No murmur heard. 1+ pitting edema above the sock line, trace to 1+ below the knees  Pulmonary/Chest: Effort normal and breath sounds normal. No respiratory distress. He has no wheezes. He has no rales. He exhibits no tenderness.  Abdominal: Soft. Bowel sounds are normal. He exhibits  no distension. There is no tenderness.  Musculoskeletal: Normal range of motion. He exhibits no edema and no tenderness.  Lymphadenopathy:    He has no cervical adenopathy.  Neurological: He is alert and oriented to person, place, and time. Coordination normal.  Skin: Skin is warm and dry. No rash noted. No erythema.  Psychiatric: He has a normal mood and affect. His behavior is normal. Judgment and thought content normal.      Assessment and Plan

## 2013-02-16 NOTE — Assessment & Plan Note (Signed)
Uncertain if calcium channel blockers contributing to his edema. We will hold this and change his medications as above

## 2013-02-16 NOTE — Assessment & Plan Note (Signed)
Cholesterol is at goal on the current lipid regimen. No changes to the medications were made.  

## 2013-02-16 NOTE — Assessment & Plan Note (Signed)
Blood pressures high. He does have significant swelling which could be exacerbated by his Procardia. We will hold the calcium channel blocker, start hydralazine 50 mg 3 times a day. He'll monitor his blood pressure and the medication can be titrated up. Other option for blood pressure medication include isosorbide, clonidine, Cardura, possibly even losartan. He does have a dry mouth and I'm hesitant to use clonidine

## 2013-02-16 NOTE — Assessment & Plan Note (Signed)
Heart rates in the 40s today. Again we'll hold his calcium channel blocker which could be contributing to bradycardia and fatigue. We'll avoid clonidine as this could again caused bradycardia and fatigue

## 2013-02-16 NOTE — Patient Instructions (Addendum)
Please hold the nifedipine  Start hydralazine 50 mg three times a day  Call the office if you have more chest pains Take nitro under the tongue for chest pain and very high blood pressure  Please call us if you have new issues that need to be addressed before your next appt.  Your physician wants you to follow-up in: 3 months.  You will receive a reminder letter in the mail two months in advance. If you don't receive a letter, please call our office to schedule the follow-up appointment.

## 2013-02-16 NOTE — Assessment & Plan Note (Signed)
No further episodes of chest tightness since he was in the swimming poor. Discussed various options with him including repeat pharmacologic Myoview. We'll hold off for now and I've encouraged him to repeat low-grade exercise. If he has recurrent symptoms, we will order the stress test. Prior stress test May 2013 showed no ischemia by report.

## 2013-02-27 ENCOUNTER — Encounter: Payer: Self-pay | Admitting: Cardiovascular Disease

## 2013-02-28 ENCOUNTER — Other Ambulatory Visit: Payer: Self-pay

## 2013-02-28 ENCOUNTER — Telehealth: Payer: Self-pay | Admitting: *Deleted

## 2013-02-28 NOTE — Telephone Encounter (Signed)
Would hold the hydralazine Start clonidine 0.1 mg twice a day We'll continue to try various generic medications for blood pressure control Glad his lower extremity edema is better without the procardia.

## 2013-02-28 NOTE — Telephone Encounter (Signed)
Pt left v/m requesting refill hydrocodone apap to [facility] [address]. Pt request cb.

## 2013-02-28 NOTE — Telephone Encounter (Signed)
MY chart message from pt : Dear Dr. Mariah Milling,  You prescribed HydrALAZINE 50mg  3 X PER DAY in place of the Nefedipine. Since starting the prescription on the night of 02/16/13 I have had severe diarrhea. My Blood pressure has avg'd 156/81 pulse avg 67. I think the HydrALAZINE is causing the diarrhea. Would you prescribe another option? Pharmacy: [facility] [address], in [place] My phone: [phone].  Returned call to pt advised MY chart message received.  Will forward to Dr Mariah Milling for his recommendations.

## 2013-02-28 NOTE — Telephone Encounter (Signed)
Patient called and he is having issues with his Rx Hydralazine 50mg . Swelling went down in his legs but he is suffering with bad stomach cramps and diarreah since starting the medication in July. Please advise patient.

## 2013-03-01 MED ORDER — HYDROCODONE-ACETAMINOPHEN 10-325 MG PO TABS: 1.0000 | ORAL_TABLET | Freq: Four times a day (QID) | ORAL | Status: DC | PRN

## 2013-03-01 MED ORDER — CLONIDINE HCL 0.1 MG PO TABS: 0.1000 mg | ORAL_TABLET | Freq: Two times a day (BID) | ORAL | Status: DC

## 2013-03-01 NOTE — Telephone Encounter (Signed)
plz phone in. 

## 2013-03-01 NOTE — Telephone Encounter (Signed)
Rx called in as directed.   

## 2013-03-01 NOTE — Telephone Encounter (Signed)
Spoke with pt advised of Dr Windell Hummingbird recommendations.  Pt will discontinue Hydralazine and start taking Clonidine 0.1mg  BID.  Rx sent to [facility] [address] at pt's request.

## 2013-03-09 ENCOUNTER — Telehealth: Payer: Self-pay | Admitting: Family Medicine

## 2013-03-09 ENCOUNTER — Encounter: Payer: Self-pay | Admitting: Family Medicine

## 2013-03-09 ENCOUNTER — Encounter: Payer: Self-pay | Admitting: *Deleted

## 2013-03-09 ENCOUNTER — Ambulatory Visit (INDEPENDENT_AMBULATORY_CARE_PROVIDER_SITE_OTHER): Payer: Medicare Other | Admitting: Family Medicine

## 2013-03-09 VITALS — BP 138/76 | HR 52 | Temp 98.5°F | Wt 234.2 lb

## 2013-03-09 DIAGNOSIS — H02402 Unspecified ptosis of left eyelid: Secondary | ICD-10-CM

## 2013-03-09 DIAGNOSIS — H02409 Unspecified ptosis of unspecified eyelid: Secondary | ICD-10-CM | POA: Insufficient documentation

## 2013-03-09 NOTE — Assessment & Plan Note (Signed)
On left of 1 year duration Neurological exam intact today. ?chalazion left upper outer eyelid. will refer to ophtho for further evaluation - rec warm compresses in interim. Pt agrees with plan. ?rosacea

## 2013-03-09 NOTE — Patient Instructions (Signed)
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 Good to see you today. Call us with questions.

## 2013-03-09 NOTE — Progress Notes (Signed)
  Subjective:    Patient ID: Randy Kennedy, male    DOB: 05-Oct-1950, 62 y.o.   MRN: [id number]  HPI CC: L eye irritation  For the last year, noticing that left eyelid stays droopy and swollen.  Intermittently feels irritation of eyelid.  Some itching of left eye.  Some blurry vision but overall no vision changes.  Recently finished a course of tobramycin/dexamethasone eye drops which helped redness but eyelid stays drooping.  Saw optometrist yesterday - new glasses prescription.  Told eye is healthy.   Worn contacts for the last 35 years.  Continues to use contacts.  Wonders if lost contact under eye.  No fevers/chills, congestion, coughing or sneezing.  No ST.  Denies facial weakness, unilateral weakness, numbness, slurring of speech. Constant rhinorrhea and PNdrainage.  Environmental allergies (white flour).  Denies h/o bell's palsy.  Past Medical History  Diagnosis Date  . GERD (gastroesophageal reflux disease)     mild, controlled with pepcid  . Arthritis   . 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 HTN (hypertension)   . HLD (hyperlipidemia)   . History of chicken pox   . Gilbert disease   . OSA (obstructive sleep apnea)     on BiPAP  . RLS (restless legs syndrome)   . Chronic pain   . Cardiac murmur   . Gout 2007  . Erectile dysfunction 09/14/2012     Review of Systems Per HPI    Objective:   Physical Exam  Nursing note and vitals reviewed. Constitutional: He appears well-developed and well-nourished. No distress.  HENT:  Head: Normocephalic and atraumatic.  Mouth/Throat: Oropharynx is clear and moist. No oropharyngeal exudate.  Eyes: Conjunctivae and EOM are normal. Pupils are equal, round, and reactive to light. Right conjunctiva is not injected. Left conjunctiva is not injected. No scleral icterus. Right eye exhibits normal extraocular motion. Left eye exhibits normal extraocular motion.  Left eyelid droop.  Mild discharge at left eye. Pulling  back left eyelid, there is evident lump at posterior outer eyelid that is irritated and tender to touch.  Neurological: No cranial nerve deficit or sensory deficit.  CN 2-12 intact (except for baseline decreased hearing of left ear)       Assessment & Plan:  Pt has signed controlled substance agreement 08/2012.  Will be due for repeat 08/2013.

## 2013-03-09 NOTE — Telephone Encounter (Signed)
The patient had another apt he had to be at, he asked if his referral can be done through [facility]

## 2013-03-16 ENCOUNTER — Ambulatory Visit: Payer: Medicare Other | Admitting: Cardiovascular Disease

## 2013-03-28 ENCOUNTER — Other Ambulatory Visit: Payer: Self-pay | Admitting: *Deleted

## 2013-03-28 MED ORDER — HYDROCODONE-ACETAMINOPHEN 10-325 MG PO TABS: 1.0000 | ORAL_TABLET | Freq: Four times a day (QID) | ORAL | Status: DC | PRN

## 2013-03-28 NOTE — Telephone Encounter (Signed)
Rx called in as directed.   

## 2013-03-28 NOTE — Telephone Encounter (Signed)
Pt calls requesting vicodin refill.

## 2013-03-28 NOTE — Telephone Encounter (Signed)
plz phone in. 

## 2013-04-25 ENCOUNTER — Other Ambulatory Visit: Payer: Self-pay

## 2013-04-25 ENCOUNTER — Other Ambulatory Visit: Payer: Self-pay | Admitting: Family Medicine

## 2013-04-25 MED ORDER — HYDROCODONE-ACETAMINOPHEN 10-325 MG PO TABS: 1.0000 | ORAL_TABLET | Freq: Four times a day (QID) | ORAL | Status: DC | PRN

## 2013-04-25 NOTE — Telephone Encounter (Signed)
plz phone in and notify patient. 

## 2013-04-25 NOTE — Telephone Encounter (Signed)
Pt left v/m requesting refill hydrocodone apap to [facility] [address]. Pt request cb when refill done.

## 2013-04-25 NOTE — Telephone Encounter (Signed)
Rx called in as directed and patient notified.  

## 2013-05-23 ENCOUNTER — Other Ambulatory Visit: Payer: Self-pay

## 2013-05-23 MED ORDER — HYDROCODONE-ACETAMINOPHEN 10-325 MG PO TABS: 1.0000 | ORAL_TABLET | Freq: Four times a day (QID) | ORAL | Status: DC | PRN

## 2013-05-23 NOTE — Telephone Encounter (Signed)
Printed and placed in Kim's box. 

## 2013-05-23 NOTE — Telephone Encounter (Signed)
Patient notified and Rx placed up front for pick up. 

## 2013-05-23 NOTE — Telephone Encounter (Signed)
Pt left v/m requesting rx hydrocodone apap. Call when ready for pick up.  

## 2013-05-24 ENCOUNTER — Telehealth: Payer: Self-pay

## 2013-05-24 NOTE — Telephone Encounter (Signed)
Pt left v/m requesting bipap supplies; mask and tubing. Bipap was issued while pt was in [place]. Pt wants to know if will need appt or can prescription be written for bipap supplies; pt has had previous sleep study but does not have copy of sleep study. Please advise.

## 2013-05-24 NOTE — Telephone Encounter (Signed)
Message left for patient to return my call.  

## 2013-05-24 NOTE — Telephone Encounter (Signed)
plz notify I have written prescription for bipap supplies to see if it will be accepted by his medical equipment company - but if they require updated sleep study we will refer him to pulmonologist.   (plz ensure he doesn't also need new BiPap machine)  Script written and placed in Kim's box.

## 2013-05-25 ENCOUNTER — Ambulatory Visit: Payer: Medicare Other | Admitting: Cardiovascular Disease

## 2013-05-25 NOTE — Telephone Encounter (Signed)
Pt left vm stating that he had a copy of sleep study faxed to our office as well as the bipap supply co.  He will pick up RX tomorrow.  CB [phone].

## 2013-05-26 NOTE — Telephone Encounter (Signed)
Pt left v/m that sleep study is being faxed to Dr Reece Agar from [facility] in [place]. Pt request Dr Sharen Hones order for bipap supplies as well as sleep study from [place] be faxed to [facility] in [place]; phone # [phone] and fax # [phone]. Pt request cb when order and sleep study has been faxed to [facility].

## 2013-05-31 ENCOUNTER — Ambulatory Visit (INDEPENDENT_AMBULATORY_CARE_PROVIDER_SITE_OTHER): Payer: Medicare Other | Admitting: Cardiovascular Disease

## 2013-05-31 ENCOUNTER — Encounter: Payer: Self-pay | Admitting: Cardiovascular Disease

## 2013-05-31 VITALS — BP 130/72 | HR 53 | Ht 67.0 in | Wt 232.5 lb

## 2013-05-31 DIAGNOSIS — R609 Edema, unspecified: Secondary | ICD-10-CM

## 2013-05-31 DIAGNOSIS — I498 Other specified cardiac arrhythmias: Secondary | ICD-10-CM

## 2013-05-31 DIAGNOSIS — R600 Localized edema: Secondary | ICD-10-CM

## 2013-05-31 DIAGNOSIS — I10 Essential (primary) hypertension: Secondary | ICD-10-CM

## 2013-05-31 DIAGNOSIS — R079 Chest pain, unspecified: Secondary | ICD-10-CM

## 2013-05-31 DIAGNOSIS — R0602 Shortness of breath: Secondary | ICD-10-CM

## 2013-05-31 DIAGNOSIS — R001 Bradycardia, unspecified: Secondary | ICD-10-CM

## 2013-05-31 DIAGNOSIS — E785 Hyperlipidemia, unspecified: Secondary | ICD-10-CM

## 2013-05-31 MED ORDER — DOXAZOSIN MESYLATE 8 MG PO TABS: 8.0000 mg | ORAL_TABLET | Freq: Every day | ORAL | Status: DC

## 2013-05-31 NOTE — Assessment & Plan Note (Signed)
Bradycardia improved without calcium channel blockers. Will avoid clonidine and other beta blockers

## 2013-05-31 NOTE — Assessment & Plan Note (Signed)
We have suggested he continue on his current medications. As blood pressure is elevated, we will add Cardura 4 mg daily. If blood pressure continues to run high, we will increase the dose up to 8 mg daily

## 2013-05-31 NOTE — Assessment & Plan Note (Signed)
Leg edema has essentially resolved without calcium channel blockers

## 2013-05-31 NOTE — Assessment & Plan Note (Signed)
Cholesterol is at goal on the current lipid regimen. No changes to the medications were made.  

## 2013-05-31 NOTE — Progress Notes (Signed)
Patient ID: Randy Kennedy, male    DOB: January 12, 1951, 62 y.o.   MRN: [id number]  HPI Comments: Mr. Scalise is a very pleasant 62 year old gentleman with a history of RLS, chronic burning in his feet/neuropathy, hypertension, hyperlipidemia, OSA who wears BiPAP, previously seen for chest tightness. He had  Bradycardia and leg edema on calcium channel blockers. Holding calcium channel blocker has improved both of these.  Reports that he had a negative stress test in 2013. This was a pharmacologic Myoview dated May 2013. He was unable to treadmill He was seen previously by cardiology in [place]. Notes indicate history of difficult to control blood pressure, hyperlipidemia, obesity, disabled. No smoking history He was started several years on Procardia XL 30 mg daily. This was increased in [place]  Previous episode of chest tightness while he was swimming in a pool. He had to hold onto the side of the pole. Mild dizziness.   In followup today, he reports that he is doing well. Blood pressure continues to run high with systolic pressures in the 160 range. He takes one half chlorthalidone, hydralazine 50 mg 3 times a day, lisinopril 40 mg. He is unable to tolerate clonidine secondary to bradycardia Leg edema has resolved  Notes from prior cardiologist indicate previous echocardiogram showing diastolic dysfunction  Grandfather died of a heart attack at 62, father doing well in his 77s as well as his mother  EKG shows sinus bradycardia with rate 53 beats per minute, no significant ST or T wave changes   Outpatient Encounter Prescriptions as of 05/31/2013  Medication Sig  . aspirin EC 81 MG tablet Take 81 mg by mouth daily.  . chlorthalidone (HYGROTON) 25 MG tablet Take 12.5 mg by mouth daily.   . cloNIDine (CATAPRES) 0.1 MG tablet Take 1 tablet (0.1 mg total) by mouth 2 (two) times daily.  . colchicine 0.6 MG tablet Take 1 tablet (0.6 mg total) by mouth 2 (two) times daily as needed (gout flare).   . famotidine (PEPCID) 20 MG tablet Take 20 mg by mouth 2 (two) times daily as needed.  . hydrALAZINE (APRESOLINE) 50 MG tablet Take 50 mg by mouth 3 (three) times daily.   Marland Kitchen HYDROcodone-acetaminophen (NORCO) 10-325 MG per tablet Take 1 tablet by mouth every 6 (six) hours as needed for pain.  Marland Kitchen lisinopril (PRINIVIL,ZESTRIL) 20 MG tablet Take 1 tablet (20 mg total) by mouth 2 (two) times daily.  . nitroGLYCERIN (NITROSTAT) 0.4 MG SL tablet Place 1 tablet (0.4 mg total) under the tongue every 5 (five) minutes as needed for chest pain.  . pravastatin (PRAVACHOL) 40 MG tablet Take 1 tablet (40 mg total) by mouth at bedtime.  Marland Kitchen tobramycin-dexamethasone (TOBRADEX) ophthalmic solution Place 1 drop into the left eye every 4 (four) hours while awake.      Review of Systems  Constitutional: Positive for fatigue.  HENT: Negative.   Eyes: Negative.   Respiratory: Negative.   Gastrointestinal: Negative.   Endocrine: Negative.   Musculoskeletal: Positive for gait problem.  Skin: Negative.   Allergic/Immunologic: Negative.   Neurological: Negative.   Hematological: Negative.   Psychiatric/Behavioral: Negative.   All other systems reviewed and are negative.    BP 130/72  Pulse 53  Ht 5\' 7"  (1.702 m)  Wt 232 lb 8 oz (105.461 kg)  BMI 36.41 kg/m2  Physical Exam  Nursing note and vitals reviewed. Constitutional: He is oriented to person, place, and time. He appears well-developed and well-nourished.  HENT:  Head: Normocephalic.  Nose: Nose  normal.  Mouth/Throat: Oropharynx is clear and moist.  Eyes: Conjunctivae are normal. Pupils are equal, round, and reactive to light.  Neck: Normal range of motion. Neck supple. No JVD present.  Cardiovascular: Normal rate, regular rhythm, S1 normal, S2 normal, normal heart sounds and intact distal pulses.  Exam reveals no gallop and no friction rub.   No murmur heard. 1+ pitting edema above the sock line, trace to 1+ below the knees  Pulmonary/Chest:  Effort normal and breath sounds normal. No respiratory distress. He has no wheezes. He has no rales. He exhibits no tenderness.  Abdominal: Soft. Bowel sounds are normal. He exhibits no distension. There is no tenderness.  Musculoskeletal: Normal range of motion. He exhibits no edema and no tenderness.  Lymphadenopathy:    He has no cervical adenopathy.  Neurological: He is alert and oriented to person, place, and time. Coordination normal.  Skin: Skin is warm and dry. No rash noted. No erythema.  Psychiatric: He has a normal mood and affect. His behavior is normal. Judgment and thought content normal.      Assessment and Plan

## 2013-05-31 NOTE — Patient Instructions (Signed)
Blood pressure is elevated today Please start cardura 1/2 pill a day for blood pressure Monitor your blood pressure  Please call us if you have new issues that need to be addressed before your next appt.  Your physician wants you to follow-up in: 6 months.  You will receive a reminder letter in the mail two months in advance. If you don't receive a letter, please call our office to schedule the follow-up appointment.

## 2013-06-07 NOTE — Telephone Encounter (Signed)
Can we call [facility] to see what further is required from [place]?  We faxed prescription and paperwork last week.  May fax latest sleep study (11/2011) - placed in Kim's box. Then update pt.

## 2013-06-13 NOTE — Telephone Encounter (Signed)
Spoke with Misty Stanley @ [facility] and faxed sleep study and demographics.

## 2013-06-13 NOTE — Telephone Encounter (Signed)
Message left notifying patient.

## 2013-06-20 ENCOUNTER — Other Ambulatory Visit: Payer: Self-pay

## 2013-06-20 ENCOUNTER — Encounter: Payer: Self-pay | Admitting: Family Medicine

## 2013-06-20 MED ORDER — HYDROCODONE-ACETAMINOPHEN 10-325 MG PO TABS: 1.0000 | ORAL_TABLET | Freq: Four times a day (QID) | ORAL | Status: DC | PRN

## 2013-06-20 MED ORDER — CHLORTHALIDONE 25 MG PO TABS: 12.5000 mg | ORAL_TABLET | Freq: Every day | ORAL | Status: DC

## 2013-06-20 NOTE — Telephone Encounter (Signed)
Printed and placed in Kim's box. 

## 2013-06-20 NOTE — Telephone Encounter (Signed)
Patient notified and Rx placed up front for pick up. Advised to bring ID. Patient verbalized understanding. 

## 2013-06-20 NOTE — Telephone Encounter (Signed)
Pt left v/m requesting rx hydrocodone apap. Call when ready for pick up.  

## 2013-07-11 ENCOUNTER — Encounter: Payer: Self-pay | Admitting: Family Medicine

## 2013-07-11 ENCOUNTER — Encounter: Payer: Self-pay | Admitting: Cardiovascular Disease

## 2013-07-11 DIAGNOSIS — I10 Essential (primary) hypertension: Secondary | ICD-10-CM

## 2013-07-11 DIAGNOSIS — M109 Gout, unspecified: Secondary | ICD-10-CM

## 2013-07-12 ENCOUNTER — Encounter: Payer: Self-pay | Admitting: Family Medicine

## 2013-07-12 MED ORDER — CHLORTHALIDONE 25 MG PO TABS: 25.0000 mg | ORAL_TABLET | Freq: Every day | ORAL | Status: DC

## 2013-07-12 MED ORDER — LISINOPRIL 20 MG PO TABS: 20.0000 mg | ORAL_TABLET | Freq: Two times a day (BID) | ORAL | Status: DC

## 2013-07-12 NOTE — Telephone Encounter (Signed)
See note - please call to schedule lab visit in 1 wk.

## 2013-07-12 NOTE — Telephone Encounter (Signed)
Spoke with patient and lab appt scheduled. 

## 2013-07-14 ENCOUNTER — Encounter: Payer: Self-pay | Admitting: Family Medicine

## 2013-07-14 ENCOUNTER — Other Ambulatory Visit: Payer: Self-pay

## 2013-07-14 MED ORDER — HYDROCODONE-ACETAMINOPHEN 10-325 MG PO TABS: 1.0000 | ORAL_TABLET | Freq: Four times a day (QID) | ORAL | Status: DC | PRN

## 2013-07-14 NOTE — Telephone Encounter (Signed)
Pt left v/m requesting rx hydrocodone apap. Call when ready for pick up. Pt needs to pick up on 07/17/13.

## 2013-07-14 NOTE — Telephone Encounter (Signed)
Patient notified and Rx placed up front for pick up. 

## 2013-07-14 NOTE — Telephone Encounter (Signed)
Printed and placed in Kim's box. 

## 2013-07-19 ENCOUNTER — Telehealth: Payer: Self-pay

## 2013-07-19 DIAGNOSIS — Z8781 Personal history of (healed) traumatic fracture: Secondary | ICD-10-CM

## 2013-07-19 NOTE — Telephone Encounter (Signed)
Spoke with patient.  H/o fall with L ankle fracture, winter seasons worsens arthritic pain, chronic issue.  Difficult to bear weight on left leg.  Crutches becoming too hard. Will provide with prescription for wheelchair for h/o gout and h/o L ankle fracture s/p mult surgeries. Script written and placed in Kim's box.

## 2013-07-19 NOTE — Telephone Encounter (Signed)
Randy Kennedy left v/m that pt is having mobility issues; Dr Reece Agar aware pt having problems with lt leg and rt foot has gout in it. Pt is using crutches but request written order for w/c. Please advise.

## 2013-07-21 ENCOUNTER — Other Ambulatory Visit: Payer: Medicare Other

## 2013-07-24 ENCOUNTER — Other Ambulatory Visit (INDEPENDENT_AMBULATORY_CARE_PROVIDER_SITE_OTHER): Payer: Medicare Other

## 2013-07-24 DIAGNOSIS — M109 Gout, unspecified: Secondary | ICD-10-CM

## 2013-07-24 DIAGNOSIS — I10 Essential (primary) hypertension: Secondary | ICD-10-CM

## 2013-07-24 LAB — BASIC METABOLIC PANEL
Calcium: 9 mg/dL (ref 8.4–10.5)
Creatinine, Ser: 1.2 mg/dL (ref 0.4–1.5)
Potassium: 3.7 mEq/L (ref 3.5–5.1)

## 2013-07-24 LAB — URIC ACID: Uric Acid, Serum: 12 mg/dL — ABNORMAL HIGH (ref 4.0–7.8)

## 2013-07-24 NOTE — Telephone Encounter (Signed)
Rx given to patient at lab appt.

## 2013-08-01 ENCOUNTER — Encounter: Payer: Self-pay | Admitting: Family Medicine

## 2013-08-03 NOTE — Telephone Encounter (Signed)
Filled and placed in Kim's box. 

## 2013-08-03 NOTE — Telephone Encounter (Signed)
Pt left v/m; pt needs written order for ultra light w/c with ability to elevate left leg; size 18 x 16-18. Pt request order faxed to [facility] fax # [phone]; [facility] phone [phone]. Pt request cb when order faxed.

## 2013-08-04 NOTE — Telephone Encounter (Signed)
Order faxed and patient notified.  

## 2013-08-07 ENCOUNTER — Other Ambulatory Visit: Payer: Self-pay | Admitting: Family Medicine

## 2013-08-07 NOTE — Telephone Encounter (Signed)
Pt left v/m that [facility] has not received fax order w/c. I spoke with Seth Bake at [facility] and they did not receive fax on 08/04/13 and request refaxed to [phone].

## 2013-08-07 NOTE — Telephone Encounter (Signed)
Refaxed as requested. 

## 2013-08-09 ENCOUNTER — Encounter: Payer: Self-pay | Admitting: *Deleted

## 2013-08-11 ENCOUNTER — Other Ambulatory Visit: Payer: Self-pay

## 2013-08-11 MED ORDER — HYDROCODONE-ACETAMINOPHEN 10-325 MG PO TABS: 1.0000 | ORAL_TABLET | Freq: Four times a day (QID) | ORAL | Status: DC | PRN

## 2013-08-11 NOTE — Telephone Encounter (Signed)
Ok to do.  Printed and placed in my out box. plz remind pt #90 should last him a full month.  If he is needing more frequent dosing recommend come in for office visit to discuss.

## 2013-08-11 NOTE — Telephone Encounter (Signed)
Patient notified. He said that if he is taking them every 6 hours that is only 22-23 days worth of meds. I advised that the script reads to only take them that often IF NEEDED. I advised if he didn't require them that often, then not to take them every 6 hours. I told him if he was needing them every 6 hours, then he needed to schedule a follow up to discuss. He verbalized understanding.

## 2013-08-11 NOTE — Telephone Encounter (Signed)
Pt left v/m requesting rx for hydrocodone apap. Call when ready for pick up. Pt said will be out of med on 08/12/13. Pt request to pick up rx today and pt wants removal from rx to fill after 08/17/13.Please advise.

## 2013-08-14 ENCOUNTER — Encounter: Payer: Self-pay | Admitting: Family Medicine

## 2013-08-16 ENCOUNTER — Other Ambulatory Visit: Payer: Self-pay | Admitting: Family Medicine

## 2013-08-23 ENCOUNTER — Encounter: Payer: Self-pay | Admitting: Family Medicine

## 2013-09-10 ENCOUNTER — Other Ambulatory Visit: Payer: Self-pay | Admitting: Family Medicine

## 2013-09-12 ENCOUNTER — Other Ambulatory Visit: Payer: Self-pay | Admitting: Family Medicine

## 2013-09-13 MED ORDER — HYDROCODONE-ACETAMINOPHEN 10-325 MG PO TABS: 1.0000 | ORAL_TABLET | Freq: Four times a day (QID) | ORAL | Status: DC | PRN

## 2013-09-13 NOTE — Telephone Encounter (Signed)
Printed and placed in Kim's box.  plz notify patient ready to pick up.

## 2013-09-13 NOTE — Telephone Encounter (Signed)
Patient arrived at office before I could call him. Rx given to [facility] to give to patient for signature.

## 2013-09-25 ENCOUNTER — Encounter: Payer: Self-pay | Admitting: Internal Medicine

## 2013-09-25 ENCOUNTER — Ambulatory Visit (INDEPENDENT_AMBULATORY_CARE_PROVIDER_SITE_OTHER): Payer: Medicare HMO | Admitting: Internal Medicine

## 2013-09-25 VITALS — BP 128/76 | HR 53 | Temp 98.2°F | Wt 214.0 lb

## 2013-09-25 DIAGNOSIS — L03319 Cellulitis of trunk, unspecified: Principal | ICD-10-CM

## 2013-09-25 DIAGNOSIS — L02219 Cutaneous abscess of trunk, unspecified: Secondary | ICD-10-CM

## 2013-09-25 MED ORDER — SULFAMETHOXAZOLE-TMP DS 800-160 MG PO TABS: 1.0000 | ORAL_TABLET | Freq: Two times a day (BID) | ORAL | Status: DC

## 2013-09-25 NOTE — Progress Notes (Signed)
Pre visit review using our clinic review tool, if applicable. No additional management support is needed unless otherwise documented below in the visit note. 

## 2013-09-25 NOTE — Patient Instructions (Addendum)
Abscess  Care After  An abscess (also called a boil or furuncle) is an infected area that contains a collection of pus. Signs and symptoms of an abscess include pain, tenderness, redness, or hardness, or you may feel a moveable soft area under your skin. An abscess can occur anywhere in the body. The infection may spread to surrounding tissues causing cellulitis. A cut (incision) by the surgeon was made over your abscess and the pus was drained out. Gauze may have been packed into the space to provide a drain that will allow the cavity to heal from the inside outwards. The boil may be painful for 5 to 7 days. Most people with a boil do not have high fevers. Your abscess, if seen early, may not have localized, and may not have been lanced. If not, another appointment may be required for this if it does not get better on its own or with medications.  HOME CARE INSTRUCTIONS   · Only take over-the-counter or prescription medicines for pain, discomfort, or fever as directed by your caregiver.  · When you bathe, soak and then remove gauze or iodoform packs at least daily or as directed by your caregiver. You may then wash the wound gently with mild soapy water. Repack with gauze or do as your caregiver directs.  SEEK IMMEDIATE MEDICAL CARE IF:   · You develop increased pain, swelling, redness, drainage, or bleeding in the wound site.  · You develop signs of generalized infection including muscle aches, chills, fever, or a general ill feeling.  · An oral temperature above 102° F (38.9° C) develops, not controlled by medication.  See your caregiver for a recheck if you develop any of the symptoms described above. If medications (antibiotics) were prescribed, take them as directed.  Document Released: 01/29/2005 Document Revised: 10/05/2011 Document Reviewed: 09/26/2007  ExitCare® Patient Information ©2014 [employer].

## 2013-09-25 NOTE — Progress Notes (Signed)
Subjective:    Patient ID: Randy Kennedy, male    DOB: 07-26-1951, 63 y.o.   MRN: [id number]  HPI  Pt  Presents to the clinic today with c/o a boil on his left hip. He originally noticed this 6 months ago. During that time, it has gotten bigger, smaller, red and warm. He does report that it has drained white/tan fluid. He reports that it is very tender to touch. He has not put anything on it. He denies fever, chills and body aches.  Review of Systems      Past Medical History  Diagnosis Date  . Heartburn     mild, controlled with pepcid  . Arthritis   . 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 HTN (hypertension)     difficult to control - clonidine and beta blockers caused bradycardia  . HLD (hyperlipidemia)   . History of chicken pox   . Gilbert disease   . OSA (obstructive sleep apnea) 11/2011 sleep study    severe, AHI 37.4 increased to 80 in REM, on BiPAP 14/10, 97% compliance >4 hrs (06/2013)  . RLS (restless legs syndrome)   . Chronic pain     L ankle and bilateral hips (remote fracture s/p surgeries)  . Cardiac murmur   . Gout 2007  . Erectile dysfunction 09/14/2012  . Hearing loss     otosclerosis  . Eczema   . Allergic rhinitis     Current Outpatient Prescriptions  Medication Sig Dispense Refill  . aspirin EC 81 MG tablet Take 81 mg by mouth daily.      . chlorthalidone (HYGROTON) 25 MG tablet Take 1 tablet (25 mg total) by mouth daily.  30 tablet  6  . COLCRYS 0.6 MG tablet TAKE ONE TABLET BY MOUTH TWICE DAILY AS NEEDED FOR GOUT FLARE  60 tablet  0  . doxazosin (CARDURA) 8 MG tablet Take 1 tablet (8 mg total) by mouth daily.  30 tablet  6  . famotidine (PEPCID) 20 MG tablet Take 20 mg by mouth 2 (two) times daily as needed.      . hydrALAZINE (APRESOLINE) 50 MG tablet Take 50 mg by mouth 3 (three) times daily.       Marland Kitchen HYDROcodone-acetaminophen (NORCO) 10-325 MG per tablet Take 1 tablet by mouth every 6 (six) hours as needed.  100 tablet  0  .  lisinopril (PRINIVIL,ZESTRIL) 20 MG tablet Take 1 tablet (20 mg total) by mouth 2 (two) times daily.  180 tablet  3  . nitroGLYCERIN (NITROSTAT) 0.4 MG SL tablet Place 1 tablet (0.4 mg total) under the tongue every 5 (five) minutes as needed for chest pain.  25 tablet  3  . pravastatin (PRAVACHOL) 40 MG tablet TAKE ONE TABLET BY MOUTH AT BEDTIME  90 tablet  3  . tobramycin-dexamethasone (TOBRADEX) ophthalmic solution Place 1 drop into the left eye every 4 (four) hours while awake.       . sulfamethoxazole-trimethoprim (BACTRIM DS) 800-160 MG per tablet Take 1 tablet by mouth 2 (two) times daily.  20 tablet  0   No current facility-administered medications for this visit.    Allergies  Allergen Reactions  . Allopurinol Other (See Comments)    GI upset  . Lisinopril-Hydrochlorothiazide     Burning in feet.  . Procardia [Nifedipine] Other (See Comments)    Bradycardia, leg swelling  . Amlodipine Rash    BLE    Family History  Problem Relation  Age of Onset  . Cancer Father     prostate  . Cancer Paternal Uncle     prostate  . Cancer Mother     rectal  . Hypertension Mother   . Diabetes Neg Hx   . Stroke Neg Hx   . CAD Neg Hx     History   Social History  . Marital Status: Married    Spouse Name: N/A    Number of Children: N/A  . Years of Education: N/A   Occupational History  . Not on file.   Social History Main Topics  . Smoking status: Former Smoker -- 1.00 packs/day for 4 years    Types: Pipe    Quit date: 07/27/1978  . Smokeless tobacco: Never Used  . Alcohol Use: No  . Drug Use: Yes  . Sexual Activity: Not on file   Other Topics Concern  . Not on file   Social History Narrative   Caffeine: 2 cans coke/day   Lives with wife and grown son    Occupation: retired, was Agricultural engineer.   On disability for chronic pain   Edu: MBA   Activity: no regular exercise   Deit: good water, fruits/vegetables daily     Constitutional: Denies fever,  malaise, fatigue, headache or abrupt weight changes.  Skin: Pt reports large abscess to left hip. Denies rashes, lesions or ulcercations.    No other specific complaints in a complete review of systems (except as listed in HPI above).  Objective:   Physical Exam   BP 128/76  Pulse 53  Temp(Src) 98.2 F (36.8 C) (Oral)  Wt 214 lb (97.07 kg)  SpO2 98% Wt Readings from Last 3 Encounters:  09/25/13 214 lb (97.07 kg)  05/31/13 232 lb 8 oz (105.461 kg)  03/09/13 234 lb 4 oz (106.255 kg)    General: Appears his stated age, obese butwell developed, well nourished in NAD. Skin: Large 4 cm by 2 cm oval shaped abscess noted on left lower abdomen/hip area surround by a large area of cellulitis. Cardiovascular: Normal rate and rhythm. S1,S2 noted.  No murmur, rubs or gallops noted. No JVD or BLE edema. No carotid bruits noted. Pulmonary/Chest: Normal effort and positive vesicular breath sounds. No respiratory distress. No wheezes, rales or ronchi noted.    BMET    Component Value Date/Time   NA 141 07/24/2013 1035   NA 143 02/09/2012   K 3.7 07/24/2013 1035   K 4.0 02/09/2012   CL 105 07/24/2013 1035   CO2 27 07/24/2013 1035   GLUCOSE 107* 07/24/2013 1035   BUN 26* 07/24/2013 1035   CREATININE 1.2 07/24/2013 1035   CREATININE 1.06 02/09/2012   CALCIUM 9.0 07/24/2013 1035    Lipid Panel     Component Value Date/Time   CHOL 147 11/02/2012 1349   TRIG 121.0 11/02/2012 1349   TRIG 142 02/09/2012   HDL 47.90 11/02/2012 1349   CHOLHDL 3 11/02/2012 1349   VLDL 24.2 11/02/2012 1349   LDLCALC 75 11/02/2012 1349   LDLCALC 47 02/09/2012          Assessment & Plan:   Abscess and cellulitis of left lower trunk:  I&D of abscess (see procedure note) eRx for septra BID x 2 days Aftercare instructions provided  Procedure Note:   I & D abscess  The patient elects to proceed after verbal consent is obtained. The patient was informed of possible risks and complications prior to procedure. Using  sterile technique throughout, patient was  numbed with approx 6 ml Lidocaine with Epi. Area cleansed with betadine x 3. Abscess incised with a # 11 blade, approx 1 inch. Green pus was able to be expressed, no odor that I could tell. Inserted wick, covered with triple antibiotic ointment and covered with pressure dressing.  RTC in 2 days to have wick removed and to recheck abscess

## 2013-09-28 ENCOUNTER — Encounter: Payer: Self-pay | Admitting: Internal Medicine

## 2013-09-28 ENCOUNTER — Ambulatory Visit (INDEPENDENT_AMBULATORY_CARE_PROVIDER_SITE_OTHER): Payer: Medicare HMO | Admitting: Internal Medicine

## 2013-09-28 VITALS — BP 120/70 | HR 50 | Temp 97.9°F | Wt 214.5 lb

## 2013-09-28 DIAGNOSIS — L03319 Cellulitis of trunk, unspecified: Principal | ICD-10-CM

## 2013-09-28 DIAGNOSIS — L02219 Cutaneous abscess of trunk, unspecified: Secondary | ICD-10-CM

## 2013-09-28 NOTE — Patient Instructions (Addendum)
Abscess  Care After  An abscess (also called a boil or furuncle) is an infected area that contains a collection of pus. Signs and symptoms of an abscess include pain, tenderness, redness, or hardness, or you may feel a moveable soft area under your skin. An abscess can occur anywhere in the body. The infection may spread to surrounding tissues causing cellulitis. A cut (incision) by the surgeon was made over your abscess and the pus was drained out. Gauze may have been packed into the space to provide a drain that will allow the cavity to heal from the inside outwards. The boil may be painful for 5 to 7 days. Most people with a boil do not have high fevers. Your abscess, if seen early, may not have localized, and may not have been lanced. If not, another appointment may be required for this if it does not get better on its own or with medications.  HOME CARE INSTRUCTIONS   · Only take over-the-counter or prescription medicines for pain, discomfort, or fever as directed by your caregiver.  · When you bathe, soak and then remove gauze or iodoform packs at least daily or as directed by your caregiver. You may then wash the wound gently with mild soapy water. Repack with gauze or do as your caregiver directs.  SEEK IMMEDIATE MEDICAL CARE IF:   · You develop increased pain, swelling, redness, drainage, or bleeding in the wound site.  · You develop signs of generalized infection including muscle aches, chills, fever, or a general ill feeling.  · An oral temperature above 102° F (38.9° C) develops, not controlled by medication.  See your caregiver for a recheck if you develop any of the symptoms described above. If medications (antibiotics) were prescribed, take them as directed.  Document Released: 01/29/2005 Document Revised: 10/05/2011 Document Reviewed: 09/26/2007  ExitCare® Patient Information ©2014 [employer].

## 2013-09-28 NOTE — Progress Notes (Signed)
Subjective:    Patient ID: Randy Kennedy, male    DOB: 05/06/1951, 63 y.o.   MRN: [id number]  HPI  Pt presents to the clinic today for followup of left lower abdomen abscess that was I & D on 09/25/13. He was placed on septra at that time.  Review of Systems      Past Medical History  Diagnosis Date  . Heartburn     mild, controlled with pepcid  . Arthritis   . 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 HTN (hypertension)     difficult to control - clonidine and beta blockers caused bradycardia  . HLD (hyperlipidemia)   . History of chicken pox   . Gilbert disease   . OSA (obstructive sleep apnea) 11/2011 sleep study    severe, AHI 37.4 increased to 80 in REM, on BiPAP 14/10, 97% compliance >4 hrs (06/2013)  . RLS (restless legs syndrome)   . Chronic pain     L ankle and bilateral hips (remote fracture s/p surgeries)  . Cardiac murmur   . Gout 2007  . Erectile dysfunction 09/14/2012  . Hearing loss     otosclerosis  . Eczema   . Allergic rhinitis     Current Outpatient Prescriptions  Medication Sig Dispense Refill  . aspirin EC 81 MG tablet Take 81 mg by mouth daily.      . chlorthalidone (HYGROTON) 25 MG tablet Take 1 tablet (25 mg total) by mouth daily.  30 tablet  6  . COLCRYS 0.6 MG tablet TAKE ONE TABLET BY MOUTH TWICE DAILY AS NEEDED FOR GOUT FLARE  60 tablet  0  . doxazosin (CARDURA) 8 MG tablet Take 1 tablet (8 mg total) by mouth daily.  30 tablet  6  . famotidine (PEPCID) 20 MG tablet Take 20 mg by mouth 2 (two) times daily as needed.      . hydrALAZINE (APRESOLINE) 50 MG tablet Take 50 mg by mouth 3 (three) times daily.       Marland Kitchen HYDROcodone-acetaminophen (NORCO) 10-325 MG per tablet Take 1 tablet by mouth every 6 (six) hours as needed.  100 tablet  0  . lisinopril (PRINIVIL,ZESTRIL) 20 MG tablet Take 1 tablet (20 mg total) by mouth 2 (two) times daily.  180 tablet  3  . nitroGLYCERIN (NITROSTAT) 0.4 MG SL tablet Place 1 tablet (0.4 mg total) under  the tongue every 5 (five) minutes as needed for chest pain.  25 tablet  3  . pravastatin (PRAVACHOL) 40 MG tablet TAKE ONE TABLET BY MOUTH AT BEDTIME  90 tablet  3  . sulfamethoxazole-trimethoprim (BACTRIM DS) 800-160 MG per tablet Take 1 tablet by mouth 2 (two) times daily.  20 tablet  0  . tobramycin-dexamethasone (TOBRADEX) ophthalmic solution Place 1 drop into the left eye every 4 (four) hours while awake.        No current facility-administered medications for this visit.    Allergies  Allergen Reactions  . Allopurinol Other (See Comments)    GI upset  . Lisinopril-Hydrochlorothiazide     Burning in feet.  . Procardia [Nifedipine] Other (See Comments)    Bradycardia, leg swelling  . Amlodipine Rash    BLE    Family History  Problem Relation Age of Onset  . Cancer Father     prostate  . Cancer Paternal Uncle     prostate  . Cancer Mother     rectal  . Hypertension Mother   .  Diabetes Neg Hx   . Stroke Neg Hx   . CAD Neg Hx     History   Social History  . Marital Status: Married    Spouse Name: N/A    Number of Children: N/A  . Years of Education: N/A   Occupational History  . Not on file.   Social History Main Topics  . Smoking status: Former Smoker -- 1.00 packs/day for 4 years    Types: Pipe    Quit date: 07/27/1978  . Smokeless tobacco: Never Used  . Alcohol Use: No  . Drug Use: Yes  . Sexual Activity: Not on file   Other Topics Concern  . Not on file   Social History Narrative   Caffeine: 2 cans coke/day   Lives with wife and grown son    Occupation: retired, was Agricultural engineer.   On disability for chronic pain   Edu: MBA   Activity: no regular exercise   Deit: good water, fruits/vegetables daily     Constitutional: Denies fever, malaise, fatigue, headache or abrupt weight changes.  Skin: Pt reports abscess to left lower abdomen. Denies rashes, lesions or ulcercations.    No other specific complaints in a complete review of  systems (except as listed in HPI above).  Objective:   Physical Exam  BP 120/70  Pulse 50  Temp(Src) 97.9 F (36.6 C) (Oral)  Wt 214 lb 8 oz (97.297 kg)  SpO2 98% Wt Readings from Last 3 Encounters:  09/28/13 214 lb 8 oz (97.297 kg)  09/25/13 214 lb (97.07 kg)  05/31/13 232 lb 8 oz (105.461 kg)    General: Appears his stated age, obese but well developed, well nourished in NAD. Skin: Abscess open and drainage, area less hard and indurated, cellulitis has improved. Cardiovascular: Normal rate and rhythm. S1,S2 noted. Murmer noted. No rubs or gallops noted. No JVD or BLE edema. No carotid bruits noted. Pulmonary/Chest: Normal effort and positive vesicular breath sounds. No respiratory distress. No wheezes, rales or ronchi noted.      BMET    Component Value Date/Time   NA 141 07/24/2013 1035   NA 143 02/09/2012   K 3.7 07/24/2013 1035   K 4.0 02/09/2012   CL 105 07/24/2013 1035   CO2 27 07/24/2013 1035   GLUCOSE 107* 07/24/2013 1035   BUN 26* 07/24/2013 1035   CREATININE 1.2 07/24/2013 1035   CREATININE 1.06 02/09/2012   CALCIUM 9.0 07/24/2013 1035    Lipid Panel     Component Value Date/Time   CHOL 147 11/02/2012 1349   TRIG 121.0 11/02/2012 1349   TRIG 142 02/09/2012   HDL 47.90 11/02/2012 1349   CHOLHDL 3 11/02/2012 1349   VLDL 24.2 11/02/2012 1349   LDLCALC 75 11/02/2012 1349   LDLCALC 47 02/09/2012           Assessment & Plan:   Abscess of left lower abdomen:  Packing removed today and repack (wife instructed to remove wick on Saturday and cover with dressing) Continue the entire course of Septra Watch for fevers, chills, body aches or increased drainage  RTC as needed or if symptoms persist or worsen

## 2013-09-28 NOTE — Progress Notes (Signed)
Pre visit review using our clinic review tool, if applicable. No additional management support is needed unless otherwise documented below in the visit note. 

## 2013-10-09 ENCOUNTER — Other Ambulatory Visit: Payer: Self-pay | Admitting: Family Medicine

## 2013-10-09 MED ORDER — HYDROCODONE-ACETAMINOPHEN 10-325 MG PO TABS: 1.0000 | ORAL_TABLET | Freq: Four times a day (QID) | ORAL | Status: DC | PRN

## 2013-10-09 NOTE — Telephone Encounter (Signed)
Message left notifying patient and Rx placed up front for pick up. 

## 2013-10-09 NOTE — Telephone Encounter (Signed)
Printed and placed in Kim's box. 

## 2013-10-09 NOTE — Telephone Encounter (Signed)
Ok to refill? See Mychart message to explain early refill.

## 2013-11-07 ENCOUNTER — Other Ambulatory Visit: Payer: Self-pay | Admitting: Family Medicine

## 2013-11-08 MED ORDER — HYDROCODONE-ACETAMINOPHEN 10-325 MG PO TABS: 1.0000 | ORAL_TABLET | Freq: Four times a day (QID) | ORAL | Status: DC | PRN

## 2013-11-08 NOTE — Telephone Encounter (Signed)
Printed and placed in Kim's box. 

## 2013-11-08 NOTE — Telephone Encounter (Signed)
Patient notified via Mychart.

## 2013-11-20 ENCOUNTER — Other Ambulatory Visit: Payer: Self-pay | Admitting: Family Medicine

## 2013-11-21 ENCOUNTER — Other Ambulatory Visit: Payer: Self-pay | Admitting: *Deleted

## 2013-11-21 MED ORDER — COLCHICINE 0.6 MG PO TABS: ORAL_TABLET | ORAL | Status: DC

## 2013-12-07 ENCOUNTER — Other Ambulatory Visit: Payer: Self-pay | Admitting: Family Medicine

## 2013-12-07 MED ORDER — HYDROCODONE-ACETAMINOPHEN 10-325 MG PO TABS: 1.0000 | ORAL_TABLET | Freq: Four times a day (QID) | ORAL | Status: DC | PRN

## 2013-12-07 NOTE — Telephone Encounter (Signed)
Printed and placed in Kim's box. 

## 2014-01-04 ENCOUNTER — Other Ambulatory Visit: Payer: Self-pay | Admitting: Family Medicine

## 2014-01-05 MED ORDER — HYDROCODONE-ACETAMINOPHEN 10-325 MG PO TABS: 1.0000 | ORAL_TABLET | Freq: Four times a day (QID) | ORAL | Status: DC | PRN

## 2014-01-05 NOTE — Telephone Encounter (Signed)
Last office visit 09/28/2013 with Webb Silversmith.  Last refilled 12/07/2013 for #100.  Ok to refill?  Patient would like to pick up Rx before 1:30pm today.

## 2014-01-05 NOTE — Telephone Encounter (Signed)
Printed and placed in Kim's box. 

## 2014-01-05 NOTE — Telephone Encounter (Signed)
Per Carrie-patient aware and Rx placed up front for pick up.

## 2014-01-30 ENCOUNTER — Ambulatory Visit: Payer: Medicare HMO | Admitting: Family Medicine

## 2014-02-01 ENCOUNTER — Other Ambulatory Visit: Payer: Self-pay | Admitting: Family Medicine

## 2014-02-01 MED ORDER — HYDROCODONE-ACETAMINOPHEN 10-325 MG PO TABS: 1.0000 | ORAL_TABLET | Freq: Four times a day (QID) | ORAL | Status: DC | PRN

## 2014-02-01 NOTE — Telephone Encounter (Signed)
Printed and placed in Kim's box. 

## 2014-02-02 NOTE — Telephone Encounter (Signed)
Patient aware and Rx placed up front for pick up.

## 2014-02-20 ENCOUNTER — Telehealth: Payer: Self-pay

## 2014-02-20 NOTE — Telephone Encounter (Signed)
Noted  

## 2014-02-20 NOTE — Telephone Encounter (Signed)
Pam nurse with [employer] [facility] [employer] left v/m; Pam has been unable to reach pt; pt has not gotten lisinopril 20 mg filled since 09/04/13. Pt got 90 day rx on 09/04/13. Pam wanted Dr [name] to know that pt has not gotten lisinopril filled recently.

## 2014-03-04 ENCOUNTER — Other Ambulatory Visit: Payer: Self-pay | Admitting: Family Medicine

## 2014-03-05 ENCOUNTER — Other Ambulatory Visit: Payer: Self-pay | Admitting: *Deleted

## 2014-03-05 MED ORDER — HYDROCODONE-ACETAMINOPHEN 10-325 MG PO TABS: 1.0000 | ORAL_TABLET | Freq: Four times a day (QID) | ORAL | Status: DC | PRN

## 2014-03-05 NOTE — Telephone Encounter (Signed)
Rx placed up front for pick up. Patient notified via [place].

## 2014-03-05 NOTE — Telephone Encounter (Signed)
Printed and placed in Kim's box. 

## 2014-03-05 NOTE — Telephone Encounter (Signed)
Ok to refill? Wants to pick up this AM.

## 2014-03-25 ENCOUNTER — Emergency Department: Payer: Self-pay | Admitting: Emergency Medicine

## 2014-03-25 LAB — CBC
HCT: 44.5 % (ref 40.0–52.0)
HGB: 14.8 g/dL (ref 13.0–18.0)
MCH: 30.1 pg (ref 26.0–34.0)
MCHC: 33.3 g/dL (ref 32.0–36.0)
MCV: 90 fL (ref 80–100)
Platelet: 111 10*3/uL — ABNORMAL LOW (ref 150–440)
RBC: 4.93 10*6/uL (ref 4.40–5.90)
RDW: 13 % (ref 11.5–14.5)
WBC: 11 10*3/uL — ABNORMAL HIGH (ref 3.8–10.6)

## 2014-03-25 LAB — BASIC METABOLIC PANEL
ANION GAP: 6 — AB (ref 7–16)
BUN: 17 mg/dL (ref 7–18)
CHLORIDE: 102 mmol/L (ref 98–107)
Calcium, Total: 9.3 mg/dL (ref 8.5–10.1)
Co2: 30 mmol/L (ref 21–32)
Creatinine: 1.22 mg/dL (ref 0.60–1.30)
EGFR (African American): >60
GLUCOSE: 112 mg/dL — AB (ref 65–99)
Osmolality: 278 (ref 275–301)
Potassium: 3.9 mmol/L (ref 3.5–5.1)
SODIUM: 138 mmol/L (ref 136–145)

## 2014-03-27 ENCOUNTER — Other Ambulatory Visit: Payer: Self-pay | Admitting: Family Medicine

## 2014-04-02 ENCOUNTER — Other Ambulatory Visit: Payer: Self-pay | Admitting: Family Medicine

## 2014-04-03 MED ORDER — HYDROCODONE-ACETAMINOPHEN 10-325 MG PO TABS: 1.0000 | ORAL_TABLET | Freq: Four times a day (QID) | ORAL | Status: DC | PRN

## 2014-04-03 NOTE — Telephone Encounter (Signed)
Ok to refill 

## 2014-04-03 NOTE — Telephone Encounter (Signed)
printed and placed in Kim's box. Due for office visit. Will need to schedule prior to more refills.

## 2014-04-04 NOTE — Telephone Encounter (Signed)
Message left notifying patient and Rx placed up front for pick up. 

## 2014-04-09 ENCOUNTER — Other Ambulatory Visit: Payer: Self-pay | Admitting: Family Medicine

## 2014-04-30 ENCOUNTER — Ambulatory Visit (INDEPENDENT_AMBULATORY_CARE_PROVIDER_SITE_OTHER): Payer: Medicare HMO | Admitting: Family Medicine

## 2014-04-30 ENCOUNTER — Encounter: Payer: Self-pay | Admitting: Family Medicine

## 2014-04-30 VITALS — BP 134/82 | HR 56 | Temp 97.8°F | Wt 210.5 lb

## 2014-04-30 DIAGNOSIS — Z23 Encounter for immunization: Secondary | ICD-10-CM

## 2014-04-30 DIAGNOSIS — I10 Essential (primary) hypertension: Secondary | ICD-10-CM

## 2014-04-30 DIAGNOSIS — G8929 Other chronic pain: Secondary | ICD-10-CM

## 2014-04-30 DIAGNOSIS — M109 Gout, unspecified: Secondary | ICD-10-CM

## 2014-04-30 DIAGNOSIS — E785 Hyperlipidemia, unspecified: Secondary | ICD-10-CM

## 2014-04-30 DIAGNOSIS — G4733 Obstructive sleep apnea (adult) (pediatric): Secondary | ICD-10-CM

## 2014-04-30 DIAGNOSIS — R202 Paresthesia of skin: Secondary | ICD-10-CM

## 2014-04-30 LAB — LIPID PANEL
Cholesterol: 151 mg/dL (ref 0–200)
HDL: 63.7 mg/dL (ref >39.00)
LDL Cholesterol: 68 mg/dL (ref 0–99)
NonHDL: 87.3
Total CHOL/HDL Ratio: 2
Triglycerides: 99 mg/dL (ref 0.0–149.0)
VLDL: 19.8 mg/dL (ref 0.0–40.0)

## 2014-04-30 LAB — COMPREHENSIVE METABOLIC PANEL
ALT: 24 U/L (ref 0–53)
AST: 26 U/L (ref 0–37)
Albumin: 4.6 g/dL (ref 3.5–5.2)
Alkaline Phosphatase: 52 U/L (ref 39–117)
BILIRUBIN TOTAL: 1.5 mg/dL — AB (ref 0.2–1.2)
BUN: 21 mg/dL (ref 6–23)
CO2: 24 mEq/L (ref 19–32)
CREATININE: 1.1 mg/dL (ref 0.4–1.5)
Calcium: 9.5 mg/dL (ref 8.4–10.5)
Chloride: 103 mEq/L (ref 96–112)
GFR: 74.9 mL/min (ref >60.00)
Glucose, Bld: 101 mg/dL — ABNORMAL HIGH (ref 70–99)
Potassium: 3.7 mEq/L (ref 3.5–5.1)
SODIUM: 137 meq/L (ref 135–145)
TOTAL PROTEIN: 8.1 g/dL (ref 6.0–8.3)

## 2014-04-30 LAB — TSH: TSH: 1.85 u[IU]/mL (ref 0.35–4.50)

## 2014-04-30 LAB — URIC ACID: Uric Acid, Serum: 9.3 mg/dL — ABNORMAL HIGH (ref 4.0–7.8)

## 2014-04-30 MED ORDER — GABAPENTIN 100 MG PO CAPS: 100.0000 mg | ORAL_CAPSULE | Freq: Three times a day (TID) | ORAL | Status: DC

## 2014-04-30 MED ORDER — HYDROCODONE-ACETAMINOPHEN 10-325 MG PO TABS: 1.0000 | ORAL_TABLET | Freq: Four times a day (QID) | ORAL | Status: DC | PRN

## 2014-04-30 NOTE — Assessment & Plan Note (Signed)
Reviewed meds. UTD UDS. Some of his pain sounds neuropathic - so will start gabapentin at 100mg  nightly.

## 2014-04-30 NOTE — Patient Instructions (Addendum)
Flu shot today. Blood work today. Trial of gabapentin 100mg  nightly for 1 week then may increase to twice daily for 1 week then may increase to 3 times daily. Good to see you today, call us with questions. Return as needed or in 6 months for medicare wellness visit.

## 2014-04-30 NOTE — Assessment & Plan Note (Signed)
Chronic, stable. Continue regimen. 

## 2014-04-30 NOTE — Assessment & Plan Note (Signed)
See above. Retrial gabapentin.

## 2014-04-30 NOTE — Assessment & Plan Note (Signed)
Continue BiPAP.  

## 2014-04-30 NOTE — Assessment & Plan Note (Signed)
Has not tolerated allopurinol. Consider uloric. Pt takes colchicine prn.

## 2014-04-30 NOTE — Progress Notes (Signed)
Pre visit review using our clinic review tool, if applicable. No additional management support is needed unless otherwise documented below in the visit note. 

## 2014-04-30 NOTE — Assessment & Plan Note (Signed)
Check FLP today. Continue pravastatin.

## 2014-04-30 NOTE — Progress Notes (Signed)
BP 134/82  Pulse 56  Temp(Src) 97.8 F (36.6 C) (Oral)  Wt 210 lb 8 oz (95.482 kg)   CC:  Med refill visit Subjective:    Patient ID: Randy Kennedy, male    DOB: [birth year], 63 y.o.   MRN: [id number]  HPI: Randy Kennedy is a 63 y.o. male presenting on 04/30/2014 for Follow-up   HTN - Compliant with current antihypertensive regimen of hydralazine 50mg  tid and lisinopril 20mg  bid and chlorthalidone 25mg  daily.  Does check blood pressures at home: 130s/80s.  No low blood pressure readings or symptoms of dizziness/syncope.  Denies HA, vision changes, CP/tightness, SOB, leg swelling.   Gout - takes colchicine 0.6mg  once every other day. Allopurinol caused stomach upset.   HLD - tolerating pravastatin nightly.  OSA - Bipap at night time. Doesn't use daily but pretty regular with use. Has not established locally. Did have 4 sleep studies in [place].  Chronic pain syndrome - h/o fractures s/p surgery of legs, as well as LBP (states has had herniated disc in past). On hydrocodone for this. UDS updated 05/2013. Endorses worsening burning pain in feet.  Has tried and failed gabapentin, lyrica. Fall 3 stories out of building ~1985 broke lower legs. H/o RLS as well.   BP Readings from Last 3 Encounters:  04/30/14 134/82  09/28/13 120/70  09/25/13 128/76   Wt Readings from Last 3 Encounters:  04/30/14 210 lb 8 oz (95.482 kg)  09/28/13 214 lb 8 oz (97.297 kg)  09/25/13 214 lb (97.07 kg)    Relevant past medical, surgical, family and social history reviewed and updated as indicated.  Allergies and medications reviewed and updated. Current Outpatient Prescriptions on File Prior to Visit  Medication Sig  . aspirin EC 81 MG tablet Take 81 mg by mouth daily.  . chlorthalidone (HYGROTON) 25 MG tablet Take 1 tablet (25 mg total) by mouth daily.  . colchicine 0.6 MG tablet TAKE ONE TABLET BY MOUTH TWICE DAILY AS NEEDED FOR GOUT FLARE  . doxazosin (CARDURA) 8 MG tablet Take 1 tablet (8 mg  total) by mouth daily.  . famotidine (PEPCID) 20 MG tablet Take 20 mg by mouth 2 (two) times daily as needed.  . hydrALAZINE (APRESOLINE) 50 MG tablet Take 50 mg by mouth 3 (three) times daily.   Marland Kitchen lisinopril (PRINIVIL,ZESTRIL) 20 MG tablet Take 1 tablet (20 mg total) by mouth 2 (two) times daily.  . nitroGLYCERIN (NITROSTAT) 0.4 MG SL tablet Place 1 tablet (0.4 mg total) under the tongue every 5 (five) minutes as needed for chest pain.  . pravastatin (PRAVACHOL) 40 MG tablet TAKE ONE TABLET BY MOUTH AT BEDTIME   No current facility-administered medications on file prior to visit.   Past Medical History  Diagnosis Date  . Heartburn     mild, controlled with pepcid  . Arthritis   . 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 HTN (hypertension)     difficult to control - clonidine and beta blockers caused bradycardia  . HLD (hyperlipidemia)   . History of chicken pox   . Gilbert disease   . OSA (obstructive sleep apnea) 11/2011 sleep study    severe, AHI 37.4 increased to 80 in REM, on BiPAP 14/10, 97% compliance >4 hrs (06/2013)  . RLS (restless legs syndrome)   . Chronic pain     L ankle and bilateral hips, (remote fracture s/p surgeries), lower back (told has herniated disc)  . Cardiac murmur   .  Gout 2007  . Erectile dysfunction 09/14/2012  . Hearing loss     otosclerosis  . Eczema   . Allergic rhinitis     Past Surgical History  Procedure Laterality Date  . Leg surgery  x5    left - after fall at work (3.5 stories)  . Tympanic membrane repair  1994    otosclerosis  . Mandible surgery  1965    jaw fracture - horse kick  . Colonoscopy  02/2011    ext hem, benign polyp, rpt 5 yrs ([place])    Review of Systems Per HPI unless specifically indicated above    Objective:    BP 134/82  Pulse 56  Temp(Src) 97.8 F (36.6 C) (Oral)  Wt 210 lb 8 oz (95.482 kg)  Physical Exam  Nursing note and vitals reviewed. Constitutional: He appears well-developed and  well-nourished. No distress.  Walks with cane  HENT:  Head: Normocephalic and atraumatic.  Mouth/Throat: Oropharynx is clear and moist. No oropharyngeal exudate.  Eyes: Conjunctivae are normal. Pupils are equal, round, and reactive to light.  Cardiovascular: Normal rate, regular rhythm, normal heart sounds and intact distal pulses.   No murmur heard. Pulmonary/Chest: Effort normal and breath sounds normal. No respiratory distress. He has no wheezes. He has no rales.  Musculoskeletal: He exhibits edema (tr).  2+ DP Calluses on soles  Skin: Skin is warm and dry. No rash noted.  Psychiatric: He has a normal mood and affect.   Results for orders placed in visit on 08/09/13  COMPREHENSIVE METABOLIC PANEL      Result Value Ref Range   Glucose 103     Sodium 143  137 - 147 mmol/L   Potassium 4.0     Creat 1.06     Alkaline Phosphatase 48     AST 32     ALT 52 (*) 10 - 40 U/L   Total Bilirubin 0.6    URIC ACID      Result Value Ref Range   Uric Acid 5.9    LIPID PANEL      Result Value Ref Range   Cholesterol, Total 130     HDL 55  35 - 70 mg/dL   LDL (calc) 47     Triglycerides 142    TSH      Result Value Ref Range   TSH 1.80    PSA      Result Value Ref Range   PSA 0.26        Assessment & Plan:   Problem List Items Addressed This Visit   OSA (obstructive sleep apnea)     Continue BiPAP.    HTN (hypertension) - Primary     Chronic, stable. Continue regimen.    Relevant Orders      Comprehensive metabolic panel   HLD (hyperlipidemia)     Check FLP today. Continue pravastatin.    Relevant Orders      Lipid panel      Comprehensive metabolic panel   Gout     Has not tolerated allopurinol. Consider uloric. Pt takes colchicine prn.    Relevant Orders      Uric acid   Chronic pain     Reviewed meds. UTD UDS. Some of his pain sounds neuropathic - so will start gabapentin at 100mg  nightly.    Relevant Medications      gabapentin (NEURONTIN) capsule       HYDROcodone-acetaminophen (NORCO) 10-325 MG per tablet   Other Relevant Orders  TSH   Burning sensation of the foot     See above. Retrial gabapentin.     Other Visit Diagnoses   Need for influenza vaccination        Relevant Orders       Flu Vaccine QUAD 36+ mos PF IM (Fluarix Quad PF) (Completed)        Follow up plan: Return in about 6 months (around 10/30/2014), or if symptoms worsen or fail to improve, for medicare wellness visit.

## 2014-05-01 ENCOUNTER — Telehealth: Payer: Self-pay | Admitting: Family Medicine

## 2014-05-01 NOTE — Telephone Encounter (Signed)
emmi emailed °

## 2014-05-03 ENCOUNTER — Encounter: Payer: Self-pay | Admitting: Family Medicine

## 2014-05-21 MED ORDER — GABAPENTIN 100 MG PO CAPS: 300.0000 mg | ORAL_CAPSULE | Freq: Three times a day (TID) | ORAL | Status: DC

## 2014-05-21 MED ORDER — GABAPENTIN 100 MG PO CAPS: 100.0000 mg | ORAL_CAPSULE | Freq: Three times a day (TID) | ORAL | Status: DC

## 2014-05-21 NOTE — Addendum Note (Signed)
Addended by: [name] on: 05/21/2014 12:45 AM   Modules accepted: Orders

## 2014-05-21 NOTE — Addendum Note (Signed)
Addended by: [name] on: 05/21/2014 06:03 PM   Modules accepted: Orders, Medications

## 2014-05-29 ENCOUNTER — Other Ambulatory Visit: Payer: Self-pay | Admitting: Family Medicine

## 2014-05-29 MED ORDER — HYDROCODONE-ACETAMINOPHEN 10-325 MG PO TABS: 1.0000 | ORAL_TABLET | Freq: Four times a day (QID) | ORAL | Status: DC | PRN

## 2014-05-29 NOTE — Telephone Encounter (Signed)
Patient notified via Mychart and Rx placed up front for pick up. 

## 2014-05-29 NOTE — Telephone Encounter (Signed)
Printed and placed in Kim's box. 

## 2014-05-30 ENCOUNTER — Other Ambulatory Visit: Payer: Self-pay | Admitting: *Deleted

## 2014-05-30 MED ORDER — COLCHICINE 0.6 MG PO TABS: ORAL_TABLET | ORAL | Status: DC

## 2014-06-11 ENCOUNTER — Other Ambulatory Visit: Payer: Self-pay | Admitting: Cardiovascular Disease

## 2014-06-27 ENCOUNTER — Other Ambulatory Visit: Payer: Self-pay | Admitting: Family Medicine

## 2014-06-28 MED ORDER — HYDROCODONE-ACETAMINOPHEN 10-325 MG PO TABS: 1.0000 | ORAL_TABLET | Freq: Four times a day (QID) | ORAL | Status: DC | PRN

## 2014-06-28 NOTE — Telephone Encounter (Signed)
Message left notifying patient and Rx placed up front for pick up. 

## 2014-06-28 NOTE — Telephone Encounter (Signed)
Printed and in Kim's box 

## 2014-07-23 ENCOUNTER — Other Ambulatory Visit: Payer: Self-pay | Admitting: Family Medicine

## 2014-07-24 MED ORDER — HYDROCODONE-ACETAMINOPHEN 10-325 MG PO TABS: 1.0000 | ORAL_TABLET | Freq: Four times a day (QID) | ORAL | Status: DC | PRN

## 2014-07-24 NOTE — Telephone Encounter (Signed)
OK to refill? Needs to pick up early due to upcoming holiday.

## 2014-07-24 NOTE — Telephone Encounter (Signed)
Printed and in Kim's box 

## 2014-07-25 NOTE — Telephone Encounter (Signed)
Message left notifying patient and Rx placed up front for pick up. 

## 2014-07-30 ENCOUNTER — Other Ambulatory Visit: Payer: Self-pay | Admitting: Family Medicine

## 2014-08-10 ENCOUNTER — Other Ambulatory Visit: Payer: Self-pay | Admitting: Family Medicine

## 2014-08-11 ENCOUNTER — Other Ambulatory Visit: Payer: Self-pay | Admitting: Family Medicine

## 2014-08-24 ENCOUNTER — Other Ambulatory Visit: Payer: Self-pay | Admitting: Family Medicine

## 2014-08-24 MED ORDER — HYDROCODONE-ACETAMINOPHEN 10-325 MG PO TABS: 1.0000 | ORAL_TABLET | Freq: Four times a day (QID) | ORAL | Status: DC | PRN

## 2014-08-24 NOTE — Telephone Encounter (Signed)
Ok to refill 

## 2014-08-24 NOTE — Telephone Encounter (Signed)
Printed and in Kim's box 

## 2014-08-27 ENCOUNTER — Encounter: Payer: Self-pay | Admitting: Family Medicine

## 2014-08-27 NOTE — Telephone Encounter (Signed)
Rx ready for pick up and Left message on machine for patient   

## 2014-09-14 ENCOUNTER — Encounter: Payer: Self-pay | Admitting: Family Medicine

## 2014-09-23 ENCOUNTER — Other Ambulatory Visit: Payer: Self-pay | Admitting: Family Medicine

## 2014-09-24 MED ORDER — HYDROCODONE-ACETAMINOPHEN 10-325 MG PO TABS: 1.0000 | ORAL_TABLET | Freq: Four times a day (QID) | ORAL | Status: DC | PRN

## 2014-09-24 NOTE — Telephone Encounter (Signed)
Printed and in Kim's box 

## 2014-09-24 NOTE — Telephone Encounter (Signed)
Patient notified via Mychart. Rx placed up front for pick up. 

## 2014-10-08 ENCOUNTER — Other Ambulatory Visit: Payer: Self-pay | Admitting: Family Medicine

## 2014-10-23 ENCOUNTER — Other Ambulatory Visit: Payer: Self-pay | Admitting: Family Medicine

## 2014-10-24 MED ORDER — HYDROCODONE-ACETAMINOPHEN 10-325 MG PO TABS: 1.0000 | ORAL_TABLET | Freq: Four times a day (QID) | ORAL | Status: DC | PRN

## 2014-10-24 NOTE — Telephone Encounter (Signed)
Patient notified via Mychart and Rx placed up front for pick up.

## 2014-10-24 NOTE — Telephone Encounter (Signed)
printed and in Kim's box. 

## 2014-11-22 ENCOUNTER — Other Ambulatory Visit: Payer: Self-pay | Admitting: Family Medicine

## 2014-11-22 NOTE — Telephone Encounter (Signed)
Last office visit 04/30/2014.  Last refilled 10/24/2014 for #100 with no refills.

## 2014-11-23 MED ORDER — HYDROCODONE-ACETAMINOPHEN 10-325 MG PO TABS: 1.0000 | ORAL_TABLET | Freq: Four times a day (QID) | ORAL | Status: DC | PRN

## 2014-11-23 NOTE — Telephone Encounter (Signed)
Printed and in kims'box  

## 2014-11-23 NOTE — Telephone Encounter (Signed)
Patient's wife called to find out if prescription is ready.  Patient is out of his medication and he wants to make sure he gets it before the weekend.

## 2014-11-23 NOTE — Telephone Encounter (Signed)
Patient notified and Rx placed up front for pick up. 

## 2014-12-07 ENCOUNTER — Ambulatory Visit: Payer: Medicare HMO | Admitting: Cardiovascular Disease

## 2014-12-14 ENCOUNTER — Encounter: Payer: Self-pay | Admitting: Cardiovascular Disease

## 2014-12-14 ENCOUNTER — Ambulatory Visit (INDEPENDENT_AMBULATORY_CARE_PROVIDER_SITE_OTHER): Payer: Medicare HMO | Admitting: Cardiovascular Disease

## 2014-12-14 VITALS — BP 158/80 | HR 50 | Ht 67.0 in | Wt 229.5 lb

## 2014-12-14 DIAGNOSIS — R001 Bradycardia, unspecified: Secondary | ICD-10-CM

## 2014-12-14 DIAGNOSIS — G629 Polyneuropathy, unspecified: Secondary | ICD-10-CM

## 2014-12-14 DIAGNOSIS — G4733 Obstructive sleep apnea (adult) (pediatric): Secondary | ICD-10-CM | POA: Diagnosis not present

## 2014-12-14 DIAGNOSIS — R600 Localized edema: Secondary | ICD-10-CM

## 2014-12-14 DIAGNOSIS — G90529 Complex regional pain syndrome I of unspecified lower limb: Secondary | ICD-10-CM

## 2014-12-14 DIAGNOSIS — I10 Essential (primary) hypertension: Secondary | ICD-10-CM

## 2014-12-14 HISTORY — DX: Complex regional pain syndrome i of unspecified lower limb: G90.529

## 2014-12-14 NOTE — Assessment & Plan Note (Signed)
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 Etiology unclear. Suggested he talk with primary care. He is not tolerating gabapentin. On certain if he would be a candidate for Lyrica/Cymbalta.

## 2014-12-14 NOTE — Assessment & Plan Note (Signed)
I suspect he has a component of chronic venous insufficiency. This may exacerbate his leg neuropathy. Recommended compression hose and leg elevation

## 2014-12-14 NOTE — Assessment & Plan Note (Signed)
Asymptomatic bradycardia. Blood pressure with good perfusion numbers. No further workup needed. No indication for pacemaker

## 2014-12-14 NOTE — Progress Notes (Signed)
Patient ID: Randy Kennedy, male    DOB: 04/14/1951, 64 y.o.   MRN: [id number]  HPI Comments: [name] is a very pleasant 64 year old gentleman with a history of RLS, chronic burning in his feet/neuropathy, hypertension, hyperlipidemia, OSA who wears BiPAP, previously seen for chest tightness. He had  Bradycardia and leg edema on calcium channel blockers. Holding calcium channel blocker has improved both of these. He presents today for follow-up of his bradycardia he blood pressure.  In follow-up today, he presents with his wife. He reports that his legs and arms are having more numbness and tingling. He has been unable to tolerate gabapentin secondary to fatigue. Wife has to drive all the time. Family was concerned about low heart rate. He denies any orthostatic type symptoms. Blood pressure well controlled at home, slightly high in the office today. Otherwise tolerating his medications Denies any significant chest pain for shortness of breath. She is sedentary, no regular exercise, weight has been trending upwards.   EKG on today's visit shows sinus bradycardia with rate 50 bpm, no significant ST or T-wave changes    other past medical history  Reports that he had a negative stress test in 2013. This was a pharmacologic Myoview dated May 2013. He was unable to treadmill He was seen previously by cardiology in [place]. Notes indicate history of difficult to control blood pressure, hyperlipidemia, obesity, disabled. No smoking history He was started several years on Procardia XL 30 mg daily. This was increased in [place]  Previous episode of chest tightness while he was swimming in a pool. He had to hold onto the side of the pole. Mild dizziness.   Notes from prior cardiologist indicate previous echocardiogram showing diastolic dysfunction  Grandfather died of a heart attack at 38, father doing well in his 64s as well as his mother   Allergies  Allergen Reactions  . Allopurinol Other  (See Comments)    GI upset  . Lisinopril-Hydrochlorothiazide     Burning in feet.  . Procardia [Nifedipine] Other (See Comments)    Bradycardia, leg swelling  . Amlodipine Rash    BLE    Current Outpatient Prescriptions on File Prior to Visit  Medication Sig Dispense Refill  . aspirin EC 81 MG tablet Take 81 mg by mouth daily.    . chlorthalidone (HYGROTON) 25 MG tablet TAKE ONE TABLET BY MOUTH ONCE DAILY 30 tablet 6  . colchicine 0.6 MG tablet TAKE ONE TABLET BY MOUTH TWICE DAILY AS NEEDED FOR  GOUT  FLARE 60 tablet 0  . doxazosin (CARDURA) 8 MG tablet TAKE ONE TABLET BY MOUTH ONCE DAILY 30 tablet 6  . famotidine (PEPCID) 20 MG tablet Take 20 mg by mouth 2 (two) times daily as needed.    . gabapentin (NEURONTIN) 100 MG capsule Take 3 capsules (300 mg total) by mouth 3 (three) times daily. 270 capsule 6  . hydrALAZINE (APRESOLINE) 50 MG tablet Take 50 mg by mouth 3 (three) times daily.     Marland Kitchen HYDROcodone-acetaminophen (NORCO) 10-325 MG per tablet Take 1 tablet by mouth every 6 (six) hours as needed. 100 tablet 0  . lisinopril (PRINIVIL,ZESTRIL) 20 MG tablet TAKE ONE TABLET BY MOUTH TWICE DAILY 180 tablet 3  . nitroGLYCERIN (NITROSTAT) 0.4 MG SL tablet Place 1 tablet (0.4 mg total) under the tongue every 5 (five) minutes as needed for chest pain. 25 tablet 3  . pravastatin (PRAVACHOL) 40 MG tablet TAKE ONE TABLET BY MOUTH AT BEDTIME 90 tablet 2   No  current facility-administered medications on file prior to visit.    Past Medical History  Diagnosis Date  . Heartburn     mild, controlled with pepcid  . Arthritis   . 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 HTN (hypertension)     difficult to control - clonidine and beta blockers caused bradycardia  . HLD (hyperlipidemia)   . History of chicken pox   . Gilbert disease   . OSA (obstructive sleep apnea) 11/2011 sleep study    severe, AHI 37.4 increased to 80 in REM, on BiPAP 14/10, 97% compliance >4 hrs (06/2013)  . RLS  (restless legs syndrome)   . Chronic pain     L ankle and bilateral hips, (remote fracture s/p surgeries), lower back (told has herniated disc)  . Cardiac murmur   . Gout 2007  . Erectile dysfunction 09/14/2012  . Hearing loss     otosclerosis  . Eczema   . Allergic rhinitis   . Neuropathy     Past Surgical History  Procedure Laterality Date  . Leg surgery  x5    left - after fall at work (3.5 stories)  . Tympanic membrane repair  1994    otosclerosis  . Mandible surgery  1965    jaw fracture - horse kick  . Colonoscopy  02/2011    ext hem, benign polyp, rpt 5 yrs ([place])    Social History  reports that he quit smoking about 36 years ago. His smoking use included Pipe. He has never used smokeless tobacco. He reports that he uses illicit drugs. He reports that he does not drink alcohol.  Family History family history includes Cancer in his father, mother, and paternal uncle; Hypertension in his mother. There is no history of Diabetes, Stroke, or CAD.   Review of Systems  Constitutional: Positive for fatigue.  Respiratory: Negative.   Gastrointestinal: Negative.   Musculoskeletal: Positive for gait problem.  Skin: Negative.   Neurological: Negative.        Numbness and tingling in his arms and legs  Hematological: Negative.   Psychiatric/Behavioral: Negative.   All other systems reviewed and are negative.   BP 158/80 mmHg  Pulse 50  Ht 5\' 7"  (1.702 m)  Wt 229 lb 8 oz (104.101 kg)  BMI 35.94 kg/m2  Physical Exam  Constitutional: He is oriented to person, place, and time. He appears well-developed and well-nourished.  HENT:  Head: Normocephalic.  Nose: Nose normal.  Mouth/Throat: Oropharynx is clear and moist.  Eyes: Conjunctivae are normal. Pupils are equal, round, and reactive to light.  Neck: Normal range of motion. Neck supple. No JVD present.  Cardiovascular: Normal rate, regular rhythm, S1 normal, S2 normal, normal heart sounds and intact distal pulses.  Exam  reveals no gallop and no friction rub.   No murmur heard. Trace lower extremity edema around the ankles  Pulmonary/Chest: Effort normal and breath sounds normal. No respiratory distress. He has no wheezes. He has no rales. He exhibits no tenderness.  Abdominal: Soft. Bowel sounds are normal. He exhibits no distension. There is no tenderness.  Musculoskeletal: Normal range of motion. He exhibits no edema or tenderness.  Lymphadenopathy:    He has no cervical adenopathy.  Neurological: He is alert and oriented to person, place, and time. Coordination normal.  Skin: Skin is warm and dry. No rash noted. No erythema.  Psychiatric: He has a normal mood and affect. His behavior is normal. Judgment and thought content  normal.      Assessment and Plan   Nursing note and vitals reviewed.

## 2014-12-14 NOTE — Patient Instructions (Signed)
You are doing well. No medication changes were made.  Watch the weight Try compression hose   Ask [name] about lyrica and cymbalta for nerve pain  Please call us if you have new issues that need to be addressed before your next appt.  Your physician wants you to follow-up in: 6 months.  You will receive a reminder letter in the mail two months in advance. If you don't receive a letter, please call our office to schedule the follow-up appointment.

## 2014-12-14 NOTE — Assessment & Plan Note (Signed)
Reports sleeping relatively well on his BiPAP, stable

## 2014-12-14 NOTE — Assessment & Plan Note (Signed)
We have recommended that he monitor his blood pressure at home. Mildly elevated on today's visit but he reports is well controlled at home

## 2014-12-18 ENCOUNTER — Encounter: Payer: Self-pay | Admitting: Family Medicine

## 2014-12-18 NOTE — Telephone Encounter (Signed)
Please see Mychart message from patient. I have already advised him that we don't have samples and that unless he pays out of pocket, he can't use the coupons since he has Medicare.

## 2014-12-21 ENCOUNTER — Other Ambulatory Visit: Payer: Self-pay | Admitting: Family Medicine

## 2014-12-21 MED ORDER — HYDROCODONE-ACETAMINOPHEN 10-325 MG PO TABS: 1.0000 | ORAL_TABLET | Freq: Four times a day (QID) | ORAL | Status: DC | PRN

## 2014-12-21 NOTE — Telephone Encounter (Signed)
Printed and in Kim's box 

## 2014-12-21 NOTE — Telephone Encounter (Signed)
Spoke to pt and informed him Rx is available for pickup from the front desk 

## 2015-01-17 ENCOUNTER — Other Ambulatory Visit: Payer: Self-pay | Admitting: Family Medicine

## 2015-01-17 MED ORDER — HYDROCODONE-ACETAMINOPHEN 10-325 MG PO TABS: 1.0000 | ORAL_TABLET | Freq: Four times a day (QID) | ORAL | Status: DC | PRN

## 2015-01-17 NOTE — Telephone Encounter (Signed)
Message left notifying patient and Rx placed up front for pick up. 

## 2015-01-17 NOTE — Telephone Encounter (Signed)
Printed and in Kim's box 

## 2015-01-21 ENCOUNTER — Other Ambulatory Visit: Payer: Self-pay | Admitting: Family Medicine

## 2015-02-13 ENCOUNTER — Other Ambulatory Visit: Payer: Self-pay | Admitting: Family Medicine

## 2015-02-13 MED ORDER — HYDROCODONE-ACETAMINOPHEN 10-325 MG PO TABS: 1.0000 | ORAL_TABLET | Freq: Four times a day (QID) | ORAL | Status: DC | PRN

## 2015-02-13 NOTE — Telephone Encounter (Signed)
This is a Dr G patient-- Rx last filled 01/17/15---pt states he is going out of town for an emergency in the morning--please advise

## 2015-02-13 NOTE — Telephone Encounter (Signed)
rx already filled by Dr. Glori Bickers

## 2015-02-13 NOTE — Telephone Encounter (Signed)
Px printed for pick up in IN box  

## 2015-02-13 NOTE — Telephone Encounter (Signed)
Left voicemail letting pt know Rx ready for pick up 

## 2015-03-14 ENCOUNTER — Other Ambulatory Visit: Payer: Self-pay | Admitting: Family Medicine

## 2015-03-14 MED ORDER — HYDROCODONE-ACETAMINOPHEN 10-325 MG PO TABS: 1.0000 | ORAL_TABLET | Freq: Four times a day (QID) | ORAL | Status: DC | PRN

## 2015-03-14 NOTE — Telephone Encounter (Signed)
Printed and in kims'box  

## 2015-03-14 NOTE — Telephone Encounter (Signed)
Left vm for patient notifying him that his Rx was here at the office & up front ready for pick up.

## 2015-04-02 ENCOUNTER — Other Ambulatory Visit: Payer: Self-pay | Admitting: Family Medicine

## 2015-04-03 ENCOUNTER — Encounter: Payer: Self-pay | Admitting: Family Medicine

## 2015-04-03 ENCOUNTER — Ambulatory Visit (INDEPENDENT_AMBULATORY_CARE_PROVIDER_SITE_OTHER): Payer: Medicare HMO | Admitting: Family Medicine

## 2015-04-03 VITALS — BP 142/68 | HR 55 | Temp 97.8°F | Ht 67.0 in | Wt 231.2 lb

## 2015-04-03 DIAGNOSIS — M109 Gout, unspecified: Secondary | ICD-10-CM

## 2015-04-03 LAB — URIC ACID: URIC ACID, SERUM: 11 mg/dL — AB (ref 4.0–7.8)

## 2015-04-03 MED ORDER — PREDNISONE 10 MG PO TABS: ORAL_TABLET | ORAL | Status: DC

## 2015-04-03 NOTE — Progress Notes (Signed)
Pre visit review using our clinic review tool, if applicable. No additional management support is needed unless otherwise documented below in the visit note. 

## 2015-04-03 NOTE — Patient Instructions (Addendum)
I think you have a bad gout flare  Take the prednisone as directed Continue colchicine if you can tolerate it  Continue your pain medicine Elevate your foot when you can  Take the prednisone taper as directed  Lab for uric acid today  Drink lots of water   Update if not starting to improve in a week or if worsening

## 2015-04-03 NOTE — Assessment & Plan Note (Signed)
In pt with chronic pain and neuropathy Today- acute flare in R great toe - radiating pain to foot and ankle Can tolerate colchicine bid with food (no more)  Already on hydrocodone for other chronic pain  Will add a prednisone taper 30 mg - to help inflammation  Uric acid today- will route to PCP (he is having more frequent flares but does not tolerate allopurinol) Given handouts on gout and also low purine diet  Enc better hydration (of note- he is also on a diuretic)

## 2015-04-03 NOTE — Progress Notes (Signed)
Subjective:    Patient ID: Randy Kennedy, male    DOB: [birth year], 64 y.o.   MRN: [id number]  HPI Here for right foot pain   Normally has nerve pain in his feet-takes gabapentin  Has chronic leg/back/knee pain also   Today the top of his foot is painful  Taking his gout medicine the past 4 days  Taking colchine am and pm --gives him diarrhea and stomach upset  A little improvement in area around his toe - but rest of the foot  Incredibly sensitive over the whole foot   No injury known  Did more sitting last Thursday/driving  Did not drink as much fluid as usual   Was eating junk food  No aged foods or wine or beer  No seafood    Can barely walk even with crutches   He has hydrocodone - for RLS and for pain in other leg  Takes that regularly   Patient Active Problem List   Diagnosis Date Noted  . Neuropathy 12/14/2014  . History of ankle fracture 07/19/2013  . Drooping eyelid 03/09/2013  . Leg edema 02/16/2013  . Bradycardia 02/16/2013  . Burning sensation of the foot 09/14/2012  . Erectile dysfunction 09/14/2012  . Gout 09/13/2012  . GERD (gastroesophageal reflux disease)   . HTN (hypertension)   . HLD (hyperlipidemia)   . OSA (obstructive sleep apnea)   . RLS (restless legs syndrome)   . Gilbert disease   . Chronic pain   . Cardiac murmur    Past Medical History  Diagnosis Date  . Heartburn     mild, controlled with pepcid  . Arthritis   . 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 HTN (hypertension)     difficult to control - clonidine and beta blockers caused bradycardia  . HLD (hyperlipidemia)   . History of chicken pox   . Gilbert disease   . OSA (obstructive sleep apnea) 11/2011 sleep study    severe, AHI 37.4 increased to 80 in REM, on BiPAP 14/10, 97% compliance >4 hrs (06/2013)  . RLS (restless legs syndrome)   . Chronic pain     L ankle and bilateral hips, (remote fracture s/p surgeries), lower back (told has herniated disc)  .  Cardiac murmur   . Gout 2007  . Erectile dysfunction 09/14/2012  . Hearing loss     otosclerosis  . Eczema   . Allergic rhinitis   . Neuropathy    Past Surgical History  Procedure Laterality Date  . Leg surgery  x5    left - after fall at work (3.5 stories)  . Tympanic membrane repair  1994    otosclerosis  . Mandible surgery  1965    jaw fracture - horse kick  . Colonoscopy  02/2011    ext hem, benign polyp, rpt 5 yrs ([place])   Social History  Substance Use Topics  . Smoking status: Former Smoker -- 1.00 packs/day for 4 years    Types: Pipe    Quit date: 07/27/1978  . Smokeless tobacco: Never Used  . Alcohol Use: No   Family History  Problem Relation Age of Onset  . Cancer Father     prostate  . Cancer Paternal Uncle     prostate  . Cancer Mother     rectal  . Hypertension Mother   . Diabetes Neg Hx   . Stroke Neg Hx   . CAD Neg Hx    Allergies  Allergen Reactions  . Allopurinol Other (See Comments)    GI upset  . Lisinopril-Hydrochlorothiazide     Burning in feet.  . Procardia [Nifedipine] Other (See Comments)    Bradycardia, leg swelling  . Amlodipine Rash    BLE   Current Outpatient Prescriptions on File Prior to Visit  Medication Sig Dispense Refill  . aspirin EC 81 MG tablet Take 81 mg by mouth daily.    . chlorthalidone (HYGROTON) 25 MG tablet TAKE ONE TABLET BY MOUTH ONCE DAILY 30 tablet 6  . colchicine 0.6 MG tablet TAKE ONE TABLET BY MOUTH TWICE DAILY AS NEEDED FOR  GOUT  FLAIR 60 tablet 0  . doxazosin (CARDURA) 8 MG tablet TAKE ONE TABLET BY MOUTH ONCE DAILY 30 tablet 6  . famotidine (PEPCID) 20 MG tablet Take 20 mg by mouth 2 (two) times daily as needed.    . gabapentin (NEURONTIN) 100 MG capsule Take 3 capsules (300 mg total) by mouth 3 (three) times daily. 270 capsule 6  . hydrALAZINE (APRESOLINE) 50 MG tablet Take 50 mg by mouth 3 (three) times daily.     Marland Kitchen HYDROcodone-acetaminophen (NORCO) 10-325 MG per tablet Take 1 tablet by mouth every 6  (six) hours as needed. 100 tablet 0  . lisinopril (PRINIVIL,ZESTRIL) 20 MG tablet TAKE ONE TABLET BY MOUTH TWICE DAILY 180 tablet 3  . nitroGLYCERIN (NITROSTAT) 0.4 MG SL tablet Place 1 tablet (0.4 mg total) under the tongue every 5 (five) minutes as needed for chest pain. 25 tablet 3  . pravastatin (PRAVACHOL) 40 MG tablet TAKE ONE TABLET BY MOUTH AT BEDTIME 90 tablet 2   No current facility-administered medications on file prior to visit.     Review of Systems    Review of Systems  Constitutional: Negative for fever, appetite change, fatigue and unexpected weight change.  Eyes: Negative for pain and visual disturbance.  Respiratory: Negative for cough and shortness of breath.   Cardiovascular: Negative for cp or palpitations    Gastrointestinal: Negative for nausea, diarrhea and constipation.  Genitourinary: Negative for urgency and frequency.  Skin: Negative for pallor or rash   MSK pos for chronic leg and back pain , pos for acute chronic pain /redness/swelling in R foot (great toe joint) Neurological: Negative for weakness, light-headedness, numbness and headaches.  Hematological: Negative for adenopathy. Does not bruise/bleed easily.  Psychiatric/Behavioral: Negative for dysphoric mood. The patient is not nervous/anxious.      Objective:   Physical Exam  Constitutional: He appears well-developed and well-nourished. No distress.  obese and well appearing   Eyes: Conjunctivae and EOM are normal. Pupils are equal, round, and reactive to light. No scleral icterus.  Neck: Normal range of motion. Neck supple.  Cardiovascular: Normal rate and regular rhythm.   Pulmonary/Chest: Effort normal and breath sounds normal.  Musculoskeletal: He exhibits edema and tenderness.  Erythema/warmth/edema over R great toe joint with tenderness Is able to move foot and ankle with discomfort Well perfused     Lymphadenopathy:    He has no cervical adenopathy.  Neurological: He is alert.  Skin:  Skin is warm and dry. No rash noted. There is erythema. No pallor.  Psychiatric: He has a normal mood and affect.          Assessment & Plan:   Problem List Items Addressed This Visit      Other   Gout - Primary    In pt with chronic pain and neuropathy Today- acute flare in R great toe -  radiating pain to foot and ankle Can tolerate colchicine bid with food (no more)  Already on hydrocodone for other chronic pain  Will add a prednisone taper 30 mg - to help inflammation  Uric acid today- will route to PCP (he is having more frequent flares but does not tolerate allopurinol) Given handouts on gout and also low purine diet  Enc better hydration (of note- he is also on a diuretic)      Relevant Orders   Uric acid

## 2015-04-05 ENCOUNTER — Telehealth: Payer: Self-pay | Admitting: *Deleted

## 2015-04-05 NOTE — Telephone Encounter (Signed)
Spoke with patient. He would like a daily med for gout and would also like to change chlorthalidone, however he is going out of town for ~ 45month and doesn't want to do this until he returns, should he have any problems. He will call me when he gets back in town to make the changes.

## 2015-04-05 NOTE — Telephone Encounter (Signed)
noted. Thanks. Will await return call

## 2015-04-11 ENCOUNTER — Other Ambulatory Visit: Payer: Self-pay | Admitting: Family Medicine

## 2015-04-12 MED ORDER — HYDROCODONE-ACETAMINOPHEN 10-325 MG PO TABS: 1.0000 | ORAL_TABLET | Freq: Four times a day (QID) | ORAL | Status: DC | PRN

## 2015-04-12 NOTE — Telephone Encounter (Signed)
Printed and in Kim's box 

## 2015-04-12 NOTE — Telephone Encounter (Signed)
Patient notified and Rx placed up front for pick up. 

## 2015-04-16 ENCOUNTER — Encounter: Payer: Self-pay | Admitting: Family Medicine

## 2015-04-16 MED ORDER — PREDNISONE 20 MG PO TABS: ORAL_TABLET | ORAL | Status: DC

## 2015-05-02 ENCOUNTER — Other Ambulatory Visit: Payer: Self-pay | Admitting: Family Medicine

## 2015-05-13 ENCOUNTER — Other Ambulatory Visit: Payer: Self-pay

## 2015-05-13 NOTE — Telephone Encounter (Signed)
Pt left v/m requesting rx hydrocodone apap. Call when ready for pick up. rx last printed # 100 on 04/12/15. Pt last f/u appt 04/30/2014;no future appt scheduled. Pt seen foot pain on 04/03/15.pt request rx ASAP.

## 2015-05-14 ENCOUNTER — Other Ambulatory Visit: Payer: Self-pay | Admitting: Family Medicine

## 2015-05-14 MED ORDER — HYDROCODONE-ACETAMINOPHEN 10-325 MG PO TABS: 1.0000 | ORAL_TABLET | Freq: Four times a day (QID) | ORAL | Status: DC | PRN

## 2015-05-14 NOTE — Telephone Encounter (Signed)
Patient left a voicemail that he needs to pick this script up tomorrow morning because that is the only time that he has access to a vehicle.  Call and let him know that it will be ready.

## 2015-05-14 NOTE — Telephone Encounter (Signed)
Message left notifying patient and Rx placed up front for pick up. Noted for patient to schedule CPE when he picks up Rx.

## 2015-05-14 NOTE — Telephone Encounter (Signed)
Printed and in Kim's box. Pt needs to schedule CPE and/or f/u visit

## 2015-06-04 ENCOUNTER — Other Ambulatory Visit: Payer: Self-pay | Admitting: Family Medicine

## 2015-06-11 ENCOUNTER — Other Ambulatory Visit: Payer: Self-pay | Admitting: Family Medicine

## 2015-06-12 MED ORDER — HYDROCODONE-ACETAMINOPHEN 10-325 MG PO TABS: 1.0000 | ORAL_TABLET | Freq: Four times a day (QID) | ORAL | Status: DC | PRN

## 2015-06-12 NOTE — Telephone Encounter (Signed)
Message left notifying patient and Rx placed up front for pick up. 

## 2015-06-12 NOTE — Telephone Encounter (Signed)
Ok to refill? Wants to pick up tomorrow AM.CPE scheduled for 1/17.

## 2015-06-12 NOTE — Telephone Encounter (Signed)
printed and in Kim's box. 

## 2015-07-02 ENCOUNTER — Other Ambulatory Visit: Payer: Self-pay | Admitting: Family Medicine

## 2015-07-03 ENCOUNTER — Telehealth: Payer: Self-pay | Admitting: Cardiovascular Disease

## 2015-07-03 NOTE — Telephone Encounter (Signed)
3 attempts to schedule from recall list.  LMOV to call office.  Deleting recall.   °

## 2015-07-10 ENCOUNTER — Other Ambulatory Visit: Payer: Self-pay | Admitting: Family Medicine

## 2015-07-10 NOTE — Telephone Encounter (Signed)
Ok to refill? Wants to pick up today before noon.

## 2015-07-11 ENCOUNTER — Other Ambulatory Visit: Payer: Self-pay | Admitting: Family Medicine

## 2015-07-11 MED ORDER — HYDROCODONE-ACETAMINOPHEN 10-325 MG PO TABS: 1.0000 | ORAL_TABLET | Freq: Four times a day (QID) | ORAL | Status: DC | PRN

## 2015-07-11 NOTE — Telephone Encounter (Signed)
PAtient came in to pick up Rx. Given to [place] to give to patient.

## 2015-07-11 NOTE — Telephone Encounter (Signed)
Printed and in Kim's box 

## 2015-07-25 ENCOUNTER — Other Ambulatory Visit: Payer: Self-pay | Admitting: Family Medicine

## 2015-07-30 ENCOUNTER — Other Ambulatory Visit: Payer: Self-pay | Admitting: *Deleted

## 2015-07-30 MED ORDER — DOXAZOSIN MESYLATE 8 MG PO TABS: 8.0000 mg | ORAL_TABLET | Freq: Every day | ORAL | Status: DC

## 2015-08-05 ENCOUNTER — Other Ambulatory Visit: Payer: Self-pay | Admitting: Family Medicine

## 2015-08-05 DIAGNOSIS — Z125 Encounter for screening for malignant neoplasm of prostate: Secondary | ICD-10-CM

## 2015-08-05 DIAGNOSIS — D696 Thrombocytopenia, unspecified: Secondary | ICD-10-CM

## 2015-08-05 DIAGNOSIS — G629 Polyneuropathy, unspecified: Secondary | ICD-10-CM

## 2015-08-05 DIAGNOSIS — E785 Hyperlipidemia, unspecified: Secondary | ICD-10-CM

## 2015-08-05 DIAGNOSIS — I10 Essential (primary) hypertension: Secondary | ICD-10-CM

## 2015-08-05 DIAGNOSIS — M109 Gout, unspecified: Secondary | ICD-10-CM

## 2015-08-07 ENCOUNTER — Other Ambulatory Visit (INDEPENDENT_AMBULATORY_CARE_PROVIDER_SITE_OTHER): Payer: Medicare HMO

## 2015-08-07 DIAGNOSIS — E785 Hyperlipidemia, unspecified: Secondary | ICD-10-CM | POA: Diagnosis not present

## 2015-08-07 DIAGNOSIS — Z125 Encounter for screening for malignant neoplasm of prostate: Secondary | ICD-10-CM | POA: Diagnosis not present

## 2015-08-07 DIAGNOSIS — D696 Thrombocytopenia, unspecified: Secondary | ICD-10-CM | POA: Diagnosis not present

## 2015-08-07 DIAGNOSIS — I10 Essential (primary) hypertension: Secondary | ICD-10-CM

## 2015-08-07 DIAGNOSIS — M109 Gout, unspecified: Secondary | ICD-10-CM

## 2015-08-07 LAB — LIPID PANEL
CHOL/HDL RATIO: 3
Cholesterol: 165 mg/dL (ref 0–200)
HDL: 59 mg/dL (ref >39.00)
LDL Cholesterol: 77 mg/dL (ref 0–99)
NONHDL: 106.42
Triglycerides: 149 mg/dL (ref 0.0–149.0)
VLDL: 29.8 mg/dL (ref 0.0–40.0)

## 2015-08-07 LAB — COMPREHENSIVE METABOLIC PANEL
ALT: 52 U/L (ref 0–53)
AST: 50 U/L — ABNORMAL HIGH (ref 0–37)
Albumin: 4.5 g/dL (ref 3.5–5.2)
Alkaline Phosphatase: 48 U/L (ref 39–117)
BILIRUBIN TOTAL: 1.8 mg/dL — AB (ref 0.2–1.2)
BUN: 30 mg/dL — ABNORMAL HIGH (ref 6–23)
CO2: 31 meq/L (ref 19–32)
Calcium: 9.8 mg/dL (ref 8.4–10.5)
Chloride: 103 mEq/L (ref 96–112)
Creatinine, Ser: 1.17 mg/dL (ref 0.40–1.50)
GFR: 66.57 mL/min (ref >60.00)
GLUCOSE: 111 mg/dL — AB (ref 70–99)
POTASSIUM: 3.8 meq/L (ref 3.5–5.1)
Sodium: 142 mEq/L (ref 135–145)
Total Protein: 7 g/dL (ref 6.0–8.3)

## 2015-08-07 LAB — CBC WITH DIFFERENTIAL/PLATELET
BASOS PCT: 0.5 % (ref 0.0–3.0)
Basophils Absolute: 0 10*3/uL (ref 0.0–0.1)
EOS PCT: 2.8 % (ref 0.0–5.0)
Eosinophils Absolute: 0.2 10*3/uL (ref 0.0–0.7)
HEMATOCRIT: 44.5 % (ref 39.0–52.0)
Hemoglobin: 15 g/dL (ref 13.0–17.0)
LYMPHS PCT: 25.9 % (ref 12.0–46.0)
Lymphs Abs: 2.2 10*3/uL (ref 0.7–4.0)
MCHC: 33.8 g/dL (ref 30.0–36.0)
MCV: 89.6 fl (ref 78.0–100.0)
MONOS PCT: 8.7 % (ref 3.0–12.0)
Monocytes Absolute: 0.8 10*3/uL (ref 0.1–1.0)
NEUTROS ABS: 5.4 10*3/uL (ref 1.4–7.7)
Neutrophils Relative %: 62.1 % (ref 43.0–77.0)
PLATELETS: 142 10*3/uL — AB (ref 150.0–400.0)
RBC: 4.96 Mil/uL (ref 4.22–5.81)
RDW: 13.5 % (ref 11.5–15.5)
WBC: 8.7 10*3/uL (ref 4.0–10.5)

## 2015-08-07 LAB — URIC ACID: Uric Acid, Serum: 11 mg/dL — ABNORMAL HIGH (ref 4.0–7.8)

## 2015-08-07 LAB — PSA: PSA: 0.8 ng/mL (ref 0.10–4.00)

## 2015-08-08 ENCOUNTER — Other Ambulatory Visit: Payer: Self-pay | Admitting: Family Medicine

## 2015-08-09 MED ORDER — HYDROCODONE-ACETAMINOPHEN 10-325 MG PO TABS: 1.0000 | ORAL_TABLET | Freq: Four times a day (QID) | ORAL | Status: DC | PRN

## 2015-08-09 NOTE — Telephone Encounter (Signed)
Message left advising patient and Rx placed up front for pick up with reminder to give 48 hour request for refills.

## 2015-08-09 NOTE — Telephone Encounter (Signed)
Pt left v/m requesting status of rx hydrocodone apap. Would like to pick up 08/09/15. Last printed # 100 on 07/11/15; pt last f/u 04/30/14; last acute visit 04/03/15 and CPX scheduled 08/14/15.

## 2015-08-09 NOTE — Telephone Encounter (Signed)
Printed and in Kim's box 

## 2015-08-14 ENCOUNTER — Encounter: Payer: Self-pay | Admitting: Family Medicine

## 2015-08-14 ENCOUNTER — Ambulatory Visit (INDEPENDENT_AMBULATORY_CARE_PROVIDER_SITE_OTHER): Payer: Medicare HMO | Admitting: Family Medicine

## 2015-08-14 VITALS — BP 122/72 | HR 60 | Temp 98.0°F | Wt 224.8 lb

## 2015-08-14 DIAGNOSIS — G8929 Other chronic pain: Secondary | ICD-10-CM

## 2015-08-14 DIAGNOSIS — I10 Essential (primary) hypertension: Secondary | ICD-10-CM

## 2015-08-14 DIAGNOSIS — G6289 Other specified polyneuropathies: Secondary | ICD-10-CM

## 2015-08-14 DIAGNOSIS — R011 Cardiac murmur, unspecified: Secondary | ICD-10-CM

## 2015-08-14 DIAGNOSIS — Z23 Encounter for immunization: Secondary | ICD-10-CM

## 2015-08-14 DIAGNOSIS — M1A09X0 Idiopathic chronic gout, multiple sites, without tophus (tophi): Secondary | ICD-10-CM

## 2015-08-14 DIAGNOSIS — E785 Hyperlipidemia, unspecified: Secondary | ICD-10-CM

## 2015-08-14 DIAGNOSIS — D696 Thrombocytopenia, unspecified: Secondary | ICD-10-CM

## 2015-08-14 DIAGNOSIS — Z0000 Encounter for general adult medical examination without abnormal findings: Secondary | ICD-10-CM

## 2015-08-14 DIAGNOSIS — Z7189 Other specified counseling: Secondary | ICD-10-CM

## 2015-08-14 DIAGNOSIS — Z0001 Encounter for general adult medical examination with abnormal findings: Secondary | ICD-10-CM | POA: Insufficient documentation

## 2015-08-14 MED ORDER — CHLORTHALIDONE 25 MG PO TABS: 25.0000 mg | ORAL_TABLET | Freq: Every day | ORAL | Status: DC

## 2015-08-14 MED ORDER — DOXAZOSIN MESYLATE 8 MG PO TABS: 8.0000 mg | ORAL_TABLET | Freq: Every day | ORAL | Status: DC

## 2015-08-14 MED ORDER — PRAVASTATIN SODIUM 40 MG PO TABS: ORAL_TABLET | ORAL | Status: DC

## 2015-08-14 MED ORDER — PREGABALIN 75 MG PO CAPS: 75.0000 mg | ORAL_CAPSULE | Freq: Two times a day (BID) | ORAL | Status: DC

## 2015-08-14 MED ORDER — FEBUXOSTAT 40 MG PO TABS: 40.0000 mg | ORAL_TABLET | Freq: Every day | ORAL | Status: DC

## 2015-08-14 MED ORDER — LISINOPRIL 20 MG PO TABS: 20.0000 mg | ORAL_TABLET | Freq: Two times a day (BID) | ORAL | Status: DC

## 2015-08-14 MED ORDER — COLCHICINE 0.6 MG PO TABS: ORAL_TABLET | ORAL | Status: DC

## 2015-08-14 NOTE — Assessment & Plan Note (Signed)
Chronic, stable. Continue current regimen. 

## 2015-08-14 NOTE — Assessment & Plan Note (Signed)
Preventative protocols reviewed and updated unless pt declined. Discussed healthy diet and lifestyle.  

## 2015-08-14 NOTE — Assessment & Plan Note (Signed)
Mild, continue to monitor.  

## 2015-08-14 NOTE — Assessment & Plan Note (Signed)
Advanced directive discussion - has living will at home. Wife is [employer]CP[place]. Asked to bring copy 

## 2015-08-14 NOTE — Patient Instructions (Addendum)
I do recommend referral for colonoscopy this summer. Flu shot today Bring me copy of living will.  Watch added sugars, avoid sodas. Trial lyrica sent to pharamacy (instead of gabapentin) Trial uloric for gout - may continue colchicine.  Sign release for records from [place] Maintenance, Male A healthy lifestyle and preventative care can promote health and wellness.  Maintain regular health, dental, and eye exams.  Eat a healthy diet. Foods like vegetables, fruits, whole grains, low-fat dairy products, and lean protein foods contain the nutrients you need and are low in calories. Decrease your intake of foods high in solid fats, added sugars, and salt. Get information about a proper diet from your health care provider, if necessary.  Regular physical exercise is one of the most important things you can do for your health. Most adults should get at least 150 minutes of moderate-intensity exercise (any activity that increases your heart rate and causes you to sweat) each week. In addition, most adults need muscle-strengthening exercises on 2 or more days a week.   Maintain a healthy weight. The body mass index (BMI) is a screening tool to identify possible weight problems. It provides an estimate of body fat based on height and weight. Your health care provider can find your BMI and can help you achieve or maintain a healthy weight. For males 20 years and older:  A BMI below 18.5 is considered underweight.  A BMI of 18.5 to 24.9 is normal.  A BMI of 25 to 29.9 is considered overweight.  A BMI of 30 and above is considered obese.  Maintain normal blood lipids and cholesterol by exercising and minimizing your intake of saturated fat. Eat a balanced diet with plenty of fruits and vegetables. Blood tests for lipids and cholesterol should begin at age 48 and be repeated every 5 years. If your lipid or cholesterol levels are high, you are over age 46, or you are at high risk for  heart disease, you may need your cholesterol levels checked more frequently.Ongoing high lipid and cholesterol levels should be treated with medicines if diet and exercise are not working.  If you smoke, find out from your health care provider how to quit. If you do not use tobacco, do not start.  Lung cancer screening is recommended for adults aged 42-80 years who are at high risk for developing lung cancer because of a history of smoking. A yearly low-dose CT scan of the lungs is recommended for people who have at least a 30-pack-year history of smoking and are current smokers or have quit within the past 15 years. A pack year of smoking is smoking an average of 1 pack of cigarettes a day for 1 year (for example, a 30-pack-year history of smoking could mean smoking 1 pack a day for 30 years or 2 packs a day for 15 years). Yearly screening should continue until the smoker has stopped smoking for at least 15 years. Yearly screening should be stopped for people who develop a health problem that would prevent them from having lung cancer treatment.  If you choose to drink alcohol, do not have more than 2 drinks per day. One drink is considered to be 12 oz (360 mL) of beer, 5 oz (150 mL) of wine, or 1.5 oz (45 mL) of liquor.  Avoid the use of street drugs. Do not share needles with anyone. Ask for help if you need support or instructions about stopping the use of drugs.  High blood pressure  causes heart disease and increases the risk of stroke. High blood pressure is more likely to develop in:  People who have blood pressure in the end of the normal range (100-139/85-89 mm Hg).  People who are overweight or obese.  People who are African American.  If you are 60-72 years of age, have your blood pressure checked every 3-5 years. If you are 58 years of age or older, have your blood pressure checked every year. You should have your blood pressure measured twice--once when you are at a hospital or  clinic, and once when you are not at a hospital or clinic. Record the average of the two measurements. To check your blood pressure when you are not at a hospital or clinic, you can use:  An automated blood pressure machine at a pharmacy.  A home blood pressure monitor.  If you are 76-35 years old, ask your health care provider if you should take aspirin to prevent heart disease.  Diabetes screening involves taking a blood sample to check your fasting blood sugar level. This should be done once every 3 years after age 90 if you are at a normal weight and without risk factors for diabetes. Testing should be considered at a younger age or be carried out more frequently if you are overweight and have at least 1 risk factor for diabetes.  Colorectal cancer can be detected and often prevented. Most routine colorectal cancer screening begins at the age of 64 and continues through age 2. However, your health care provider may recommend screening at an earlier age if you have risk factors for colon cancer. On a yearly basis, your health care provider may provide home test kits to check for hidden blood in the stool. A small camera at the end of a tube may be used to directly examine the colon (sigmoidoscopy or colonoscopy) to detect the earliest forms of colorectal cancer. Talk to your health care provider about this at age 33 when routine screening begins. A direct exam of the colon should be repeated every 5-10 years through age 57, unless early forms of precancerous polyps or small growths are found.  People who are at an increased risk for hepatitis B should be screened for this virus. You are considered at high risk for hepatitis B if:  You were born in a country where hepatitis B occurs often. Talk with your health care provider about which countries are considered high risk.  Your parents were born in a high-risk country and you have not received a shot to protect against hepatitis B (hepatitis B  vaccine).  You have HIV or AIDS.  You use needles to inject street drugs.  You live with, or have sex with, someone who has hepatitis B.  You are a man who has sex with other men (MSM).  You get hemodialysis treatment.  You take certain medicines for conditions like cancer, organ transplantation, and autoimmune conditions.  Hepatitis C blood testing is recommended for all people born from [birth year] through 1965 and any individual with known risk factors for hepatitis C.  Healthy men should no longer receive prostate-specific antigen (PSA) blood tests as part of routine cancer screening. Talk to your health care provider about prostate cancer screening.  Testicular cancer screening is not recommended for adolescents or adult males who have no symptoms. Screening includes self-exam, a health care provider exam, and other screening tests. Consult with your health care provider about any symptoms you have or any concerns you have  about testicular cancer.  Practice safe sex. Use condoms and avoid high-risk sexual practices to reduce the spread of sexually transmitted infections (STIs).  You should be screened for STIs, including gonorrhea and chlamydia if:  You are sexually active and are younger than 24 years.  You are older than 24 years, and your health care provider tells you that you are at risk for this type of infection.  Your sexual activity has changed since you were last screened, and you are at an increased risk for chlamydia or gonorrhea. Ask your health care provider if you are at risk.  If you are at risk of being infected with HIV, it is recommended that you take a prescription medicine daily to prevent HIV infection. This is called pre-exposure prophylaxis (PrEP). You are considered at risk if:  You are a man who has sex with other men (MSM).  You are a heterosexual man who is sexually active with multiple partners.  You take drugs by injection.  You are sexually active  with a partner who has HIV.  Talk with your health care provider about whether you are at high risk of being infected with HIV. If you choose to begin PrEP, you should first be tested for HIV. You should then be tested every 3 months for as long as you are taking PrEP.  Use sunscreen. Apply sunscreen liberally and repeatedly throughout the day. You should seek shade when your shadow is shorter than you. Protect yourself by wearing long sleeves, pants, a wide-brimmed hat, and sunglasses year round whenever you are outdoors.  Tell your health care provider of new moles or changes in moles, especially if there is a change in shape or color. Also, tell your health care provider if a mole is larger than the size of a pencil eraser.  A one-time screening for abdominal aortic aneurysm (AAA) and surgical repair of large AAAs by ultrasound is recommended for men aged 61-75 years who are current or former smokers.  Stay current with your vaccines (immunizations).   This information is not intended to replace advice given to you by your health care provider. Make sure you discuss any questions you have with your health care provider.   Document Released: 01/09/2008 Document Revised: 08/03/2014 Document Reviewed: 12/08/2010 Elsevier Interactive Patient Education [employer].

## 2015-08-14 NOTE — Progress Notes (Signed)
BP 122/72 mmHg  Pulse 60  Temp(Src) 98 F (36.7 C) (Oral)  Wt 224 lb 12 oz (101.946 kg)   CC: medicare wellness visit  Subjective:    Patient ID: Randy Kennedy, male    DOB: Aug 02, 1950, 65 y.o.   MRN: [id number]  HPI: Randy Kennedy is a 65 y.o. male presenting on 08/14/2015 for Annual Exam   Gout - intolerant to allopurinol - GI upset. Takes colchicine 0.6mg  once daily.   Periph neuropathy - worsening recently. Unsure cause. Has had 3 EMGs in the past - saw neurologist in [place] regularly. No h/o DM, no h/o EtOH use. Known RLS. Gabapentin helps but causes over sedation. Interested in trial lyrica.   Hearing screen - hearing aides Vision screen - eye center 10/2014 Fall risk screen - passed Depression screen - passed  Preventative: COLONOSCOPY Date: 02/2011 ext hem, benign polyp, rpt 5 yrs ([place]) Prostate cancer screening - rec screening given fmhx. Lung cancer screening - doesn't qualify Flu shot - today Tetanus shot - 2014 Pneumonia shot - not due Shingles shot - 04/2013 Advanced directive discussion - has living will at home. Wife is Occupational psychologist. Asked to bring copy Seat belt use discussed Sunscreen use and skin screen discussed. No changing moles on skin.  Caffeine: 2 cans coke/day Lives with wife and grown son  Occupation: retired, was Agricultural engineer.  On disability for chronic pain Edu: MBA Activity: no regular exercise Deit: good water, fruits/vegetables daily  Relevant past medical, surgical, family and social history reviewed and updated as indicated. Interim medical history since our last visit reviewed. Allergies and medications reviewed and updated. Current Outpatient Prescriptions on File Prior to Visit  Medication Sig  . aspirin EC 81 MG tablet Take 81 mg by mouth daily.  . famotidine (PEPCID) 20 MG tablet Take 20 mg by mouth 2 (two) times daily as needed.  . gabapentin (NEURONTIN) 100 MG capsule TAKE THREE CAPSULES BY MOUTH THREE TIMES  DAILY  . HYDROcodone-acetaminophen (NORCO) 10-325 MG tablet Take 1 tablet by mouth every 6 (six) hours as needed.  . nitroGLYCERIN (NITROSTAT) 0.4 MG SL tablet Place 1 tablet (0.4 mg total) under the tongue every 5 (five) minutes as needed for chest pain.  . hydrALAZINE (APRESOLINE) 50 MG tablet Take 50 mg by mouth 3 (three) times daily. Reported on 08/14/2015   No current facility-administered medications on file prior to visit.    Review of Systems  Constitutional: Negative for fever, chills, activity change, appetite change, fatigue and unexpected weight change.  HENT: Negative for hearing loss.   Eyes: Negative for visual disturbance.  Respiratory: Negative for cough, chest tightness, shortness of breath and wheezing.   Cardiovascular: Positive for leg swelling (chronic). Negative for chest pain and palpitations.  Gastrointestinal: Positive for abdominal pain (interimttent). Negative for nausea, vomiting, diarrhea, constipation, blood in stool and abdominal distention.  Genitourinary: Negative for hematuria and difficulty urinating.  Musculoskeletal: Negative for myalgias, arthralgias and neck pain.  Skin: Negative for rash.  Neurological: Negative for dizziness, seizures, syncope and headaches.  Hematological: Negative for adenopathy. Does not bruise/bleed easily.  Psychiatric/Behavioral: Negative for dysphoric mood. The patient is not nervous/anxious.    Per HPI unless specifically indicated in ROS section     Objective:    BP 122/72 mmHg  Pulse 60  Temp(Src) 98 F (36.7 C) (Oral)  Wt 224 lb 12 oz (101.946 kg)  Wt Readings from Last 3 Encounters:  08/14/15 224 lb 12 oz (101.946 kg)  04/03/15  231 lb 4 oz (104.894 kg)  12/14/14 229 lb 8 oz (104.101 kg)    Physical Exam  Constitutional: He is oriented to person, place, and time. He appears well-developed and well-nourished. No distress.  walks with cane  HENT:  Head: Normocephalic and atraumatic.  Right Ear: Hearing,  tympanic membrane, external ear and ear canal normal.  Left Ear: Hearing, tympanic membrane, external ear and ear canal normal.  Nose: Nose normal.  Mouth/Throat: Uvula is midline, oropharynx is clear and moist and mucous membranes are normal. No oropharyngeal exudate, posterior oropharyngeal edema or posterior oropharyngeal erythema.  Eyes: Conjunctivae and EOM are normal. Pupils are equal, round, and reactive to light. No scleral icterus.  Neck: Normal range of motion. Neck supple. Carotid bruit is present (L sided mild). No thyromegaly present.  Cardiovascular: Normal rate, regular rhythm and intact distal pulses.   Murmur (2/6 SEM) heard. Pulses:      Radial pulses are 2+ on the right side, and 2+ on the left side.  Pulmonary/Chest: Effort normal and breath sounds normal. No respiratory distress. He has no wheezes. He has no rales.  Abdominal: Soft. Bowel sounds are normal. He exhibits no distension and no mass. There is no tenderness. There is no rebound and no guarding.  Musculoskeletal: Normal range of motion. He exhibits no edema.  Small bunion medial R foot   Lymphadenopathy:    He has no cervical adenopathy.  Neurological: He is alert and oriented to person, place, and time.  CN grossly intact, station and gait intact Recall 3/3 Calculation 5/5 serial 3s  Skin: Skin is warm and dry. No rash noted.  Psychiatric: He has a normal mood and affect. His behavior is normal. Judgment and thought content normal.  Nursing note and vitals reviewed.  Results for orders placed or performed in visit on 08/07/15  Lipid panel  Result Value Ref Range   Cholesterol 165 0 - 200 mg/dL   Triglycerides 149.0 0.0 - 149.0 mg/dL   HDL 59.00 >39.00 mg/dL   VLDL 29.8 0.0 - 40.0 mg/dL   LDL Cholesterol 77 0 - 99 mg/dL   Total CHOL/HDL Ratio 3    NonHDL 106.42   Comprehensive metabolic panel  Result Value Ref Range   Sodium 142 135 - 145 mEq/L   Potassium 3.8 3.5 - 5.1 mEq/L   Chloride 103 96 -  112 mEq/L   CO2 31 19 - 32 mEq/L   Glucose, Bld 111 (H) 70 - 99 mg/dL   BUN 30 (H) 6 - 23 mg/dL   Creatinine, Ser 1.17 0.40 - 1.50 mg/dL   Total Bilirubin 1.8 (H) 0.2 - 1.2 mg/dL   Alkaline Phosphatase 48 39 - 117 U/L   AST 50 (H) 0 - 37 U/L   ALT 52 0 - 53 U/L   Total Protein 7.0 6.0 - 8.3 g/dL   Albumin 4.5 3.5 - 5.2 g/dL   Calcium 9.8 8.4 - 10.5 mg/dL   GFR 66.57 >60.00 mL/min  PSA  Result Value Ref Range   PSA 0.80 0.10 - 4.00 ng/mL  CBC with Differential/Platelet  Result Value Ref Range   WBC 8.7 4.0 - 10.5 K/uL   RBC 4.96 4.22 - 5.81 Mil/uL   Hemoglobin 15.0 13.0 - 17.0 g/dL   HCT 44.5 39.0 - 52.0 %   MCV 89.6 78.0 - 100.0 fl   MCHC 33.8 30.0 - 36.0 g/dL   RDW 13.5 11.5 - 15.5 %   Platelets 142.0 (L) 150.0 - 400.0 K/uL  Neutrophils Relative % 62.1 43.0 - 77.0 %   Lymphocytes Relative 25.9 12.0 - 46.0 %   Monocytes Relative 8.7 3.0 - 12.0 %   Eosinophils Relative 2.8 0.0 - 5.0 %   Basophils Relative 0.5 0.0 - 3.0 %   Neutro Abs 5.4 1.4 - 7.7 K/uL   Lymphs Abs 2.2 0.7 - 4.0 K/uL   Monocytes Absolute 0.8 0.1 - 1.0 K/uL   Eosinophils Absolute 0.2 0.0 - 0.7 K/uL   Basophils Absolute 0.0 0.0 - 0.1 K/uL  Uric acid  Result Value Ref Range   Uric Acid, Serum 11.0 (H) 4.0 - 7.8 mg/dL      Assessment & Plan:   Problem List Items Addressed This Visit    Thrombocytopenia ([place])    Mild, continue to monitor.      Peripheral neuropathy ([place])    I have requested records of prior evaluation in [place] by neurology - pt states he had several EMGs in the past.      Relevant Medications   pregabalin (LYRICA) 75 MG capsule   Medicare annual wellness visit, initial - Primary    I have personally reviewed the Medicare Annual Wellness questionnaire and have noted 1. The patient's medical and social history 2. Their use of alcohol, tobacco or illicit drugs 3. Their current medications and supplements 4. The patient's functional ability including ADL's, fall risks, home safety  risks and hearing or visual impairment. Cognitive function has been assessed and addressed as indicated.  5. Diet and physical activity 6. Evidence for depression or mood disorders The patients weight, height, BMI have been recorded in the chart. I have made referrals, counseling and provided education to the patient based on review of the above and I have provided the pt with a written personalized care plan for preventive services. Provider list updated.. See scanned questionairre as needed for further documentation. Reviewed preventative protocols and updated unless pt declined.       HTN (hypertension)    Chronic, stable. Continue current regimen.      Relevant Medications   chlorthalidone (HYGROTON) 25 MG tablet   doxazosin (CARDURA) 8 MG tablet   lisinopril (PRINIVIL,ZESTRIL) 20 MG tablet   pravastatin (PRAVACHOL) 40 MG tablet   HLD (hyperlipidemia)    Chronic, stable. Continue current regimen.      Relevant Medications   chlorthalidone (HYGROTON) 25 MG tablet   doxazosin (CARDURA) 8 MG tablet   lisinopril (PRINIVIL,ZESTRIL) 20 MG tablet   pravastatin (PRAVACHOL) 40 MG tablet   Health maintenance examination    Preventative protocols reviewed and updated unless pt declined. Discussed healthy diet and lifestyle.       Gout    Intolerant to allopurinol. Will trial uloric 30mg  daily. Continue colchicine 0.6mg  BID PRN.      Chronic pain    Continue #100 hydrocodone monthly. Update UDS next month.      Relevant Medications   pregabalin (LYRICA) 75 MG capsule   Cardiac murmur    Chronic, stable. Longstanding. Continue to monitor.      Advanced care planning/counseling discussion    Advanced directive discussion - has living will at home. Wife is Occupational psychologist. Asked to bring copy          Follow up plan: Return in about 6 months (around 02/11/2016).

## 2015-08-14 NOTE — Assessment & Plan Note (Signed)
Intolerant to allopurinol. Will trial uloric 30mg  daily. Continue colchicine 0.6mg  BID PRN.

## 2015-08-14 NOTE — Assessment & Plan Note (Signed)
I have requested records of prior evaluation in [place] by neurology - pt states he had several EMGs in the past.

## 2015-08-14 NOTE — Addendum Note (Signed)
Addended by: Royann Shivers A on: 08/14/2015 12:07 PM   Modules accepted: Orders

## 2015-08-14 NOTE — Assessment & Plan Note (Signed)

## 2015-08-14 NOTE — Progress Notes (Signed)
Pre visit review using our clinic review tool, if applicable. No additional management support is needed unless otherwise documented below in the visit note. 

## 2015-08-14 NOTE — Assessment & Plan Note (Signed)
Continue #100 hydrocodone monthly. Update UDS next month.

## 2015-08-14 NOTE — Assessment & Plan Note (Signed)
Chronic, stable. Longstanding. Continue to monitor.

## 2015-08-15 ENCOUNTER — Telehealth: Payer: Self-pay

## 2015-08-15 NOTE — Telephone Encounter (Signed)
Pt left another message and has another provider line # to call [phone].

## 2015-08-15 NOTE — Telephone Encounter (Signed)
PA's completed on both meds through Cover my Meds. Will await determination.

## 2015-08-15 NOTE — Telephone Encounter (Signed)
Pt left v/m requesting Prior auth for lyrica 75 mg and uloric 40 mg called to [phone] when instructed put in transfer code [id number]. This is so Med [facility] will cover prescriptions.

## 2015-08-16 ENCOUNTER — Encounter: Payer: Self-pay | Admitting: Family Medicine

## 2015-08-19 MED ORDER — FEBUXOSTAT 40 MG PO TABS: 40.0000 mg | ORAL_TABLET | Freq: Every day | ORAL | Status: DC

## 2015-08-19 NOTE — Telephone Encounter (Signed)
See pt email. Can we do PAs for lyrica and euloric?

## 2015-08-19 NOTE — Telephone Encounter (Signed)
I've already done both of them and am just waiting on a response which can sometimes take up to 5 business days. I submitted them late on 08/15/15, so they may not have received them until 08/16/15.

## 2015-08-20 NOTE — Telephone Encounter (Signed)
Lyrica denied. In your IN box for review. Still awaiting Uloric.

## 2015-08-22 ENCOUNTER — Encounter: Payer: Self-pay | Admitting: Family Medicine

## 2015-08-22 NOTE — Telephone Encounter (Addendum)
plz call and give pt update - continue gabapentin, lyrica denied. Will await records to see if lyrica will be approved with more info from prior workup.  Alternatively could try lyrica using coupons we have but would not go through his insurance.  Still awaiting uloric.

## 2015-08-22 NOTE — Addendum Note (Signed)
Addended by: [name] on: 08/22/2015 09:55 AM   Modules accepted: Orders, Medications

## 2015-08-29 NOTE — Telephone Encounter (Signed)
Patient notified via Mychart.

## 2015-09-04 ENCOUNTER — Other Ambulatory Visit: Payer: Self-pay | Admitting: Family Medicine

## 2015-09-04 NOTE — Telephone Encounter (Signed)
Ok to refill 

## 2015-09-05 MED ORDER — HYDROCODONE-ACETAMINOPHEN 10-325 MG PO TABS: 1.0000 | ORAL_TABLET | Freq: Four times a day (QID) | ORAL | Status: DC | PRN

## 2015-09-05 NOTE — Telephone Encounter (Signed)
Printed and  Kims' box.

## 2015-09-05 NOTE — Telephone Encounter (Signed)
Left voicemail letting pt know Rx ready for pick up 

## 2015-09-06 ENCOUNTER — Encounter: Payer: Self-pay | Admitting: Family Medicine

## 2015-09-12 ENCOUNTER — Other Ambulatory Visit: Payer: Self-pay | Admitting: Family Medicine

## 2015-10-01 ENCOUNTER — Encounter: Payer: Self-pay | Admitting: Family Medicine

## 2015-10-02 ENCOUNTER — Other Ambulatory Visit: Payer: Self-pay | Admitting: Family Medicine

## 2015-10-02 NOTE — Telephone Encounter (Signed)
Last office visit 08/14/2015.  Last refilled 09/05/2015 for #100 with no refills.  Ok to refill?

## 2015-10-03 MED ORDER — HYDROCODONE-ACETAMINOPHEN 10-325 MG PO TABS: 1.0000 | ORAL_TABLET | Freq: Four times a day (QID) | ORAL | Status: DC | PRN

## 2015-10-03 NOTE — Telephone Encounter (Signed)
Printed and in Kim's box 

## 2015-10-03 NOTE — Telephone Encounter (Signed)
Message left advising patient and Rx placed up front for pick up. 

## 2015-10-22 ENCOUNTER — Encounter: Payer: Self-pay | Admitting: Family Medicine

## 2015-10-22 ENCOUNTER — Ambulatory Visit (INDEPENDENT_AMBULATORY_CARE_PROVIDER_SITE_OTHER): Payer: Medicare HMO | Admitting: Family Medicine

## 2015-10-22 ENCOUNTER — Telehealth: Payer: Self-pay

## 2015-10-22 VITALS — BP 124/72 | HR 56 | Temp 98.1°F | Wt 205.8 lb

## 2015-10-22 DIAGNOSIS — M21619 Bunion of unspecified foot: Secondary | ICD-10-CM

## 2015-10-22 DIAGNOSIS — M1A09X0 Idiopathic chronic gout, multiple sites, without tophus (tophi): Secondary | ICD-10-CM | POA: Diagnosis not present

## 2015-10-22 DIAGNOSIS — R05 Cough: Secondary | ICD-10-CM

## 2015-10-22 DIAGNOSIS — M109 Gout, unspecified: Secondary | ICD-10-CM | POA: Insufficient documentation

## 2015-10-22 DIAGNOSIS — G6289 Other specified polyneuropathies: Secondary | ICD-10-CM

## 2015-10-22 DIAGNOSIS — M1000 Idiopathic gout, unspecified site: Secondary | ICD-10-CM | POA: Diagnosis not present

## 2015-10-22 DIAGNOSIS — R059 Cough, unspecified: Secondary | ICD-10-CM

## 2015-10-22 MED ORDER — FEBUXOSTAT 40 MG PO TABS: 40.0000 mg | ORAL_TABLET | Freq: Every day | ORAL | Status: DC

## 2015-10-22 MED ORDER — PREGABALIN 75 MG PO CAPS: 75.0000 mg | ORAL_CAPSULE | Freq: Two times a day (BID) | ORAL | Status: DC

## 2015-10-22 MED ORDER — PREDNISONE 20 MG PO TABS: ORAL_TABLET | ORAL | Status: DC

## 2015-10-22 NOTE — Progress Notes (Signed)
Pre visit review using our clinic review tool, if applicable. No additional management support is needed unless otherwise documented below in the visit note. 

## 2015-10-22 NOTE — Assessment & Plan Note (Signed)
Anticipate resolving podagra (off uloric for last 1 month). Continue colchicine 1tab daily. Add prednisone taper. Start uloric once gout flare has resolved.

## 2015-10-22 NOTE — Assessment & Plan Note (Signed)
Refer to podiatry for further eval/treatment of bunion and callus

## 2015-10-22 NOTE — Assessment & Plan Note (Addendum)
Will again request records from prior neurologist in [facility].

## 2015-10-22 NOTE — Assessment & Plan Note (Signed)
Off uloric for 1 month. Continues colchicine 0.6mg  daily.  Will treat current presumed flare with prednisone course then restart uloric.

## 2015-10-22 NOTE — Assessment & Plan Note (Addendum)
Anticipate post infectious bronchial cough after recent viral URI. Supportive care discussed. Prednisone for podagra may help. Provided with cheratussin cough syrup for night time. Discussed precautions with mixing with hydrocodone.

## 2015-10-22 NOTE — Patient Instructions (Addendum)
Sign release for records from prior peripheral neuropathy workup. We will try lyrica again.  We will refer you to podiatrist. I am suspicious for gout flare - treat with short prednisone course sent to pharmacy. After foot feeling better, restart uloric (refilled today). Continue colchicine 1 tablet daily. cheratussin cough syrup for night time. Don't mix with pain medication.

## 2015-10-22 NOTE — Progress Notes (Signed)
Called in Lyrica to [employer] [facility] [employer]

## 2015-10-22 NOTE — Telephone Encounter (Signed)
Form filled out and faxed back to [facility]. Waiting on response.

## 2015-10-22 NOTE — Progress Notes (Signed)
BP 124/72 mmHg  Pulse 56  Temp(Src) 98.1 F (36.7 C) (Oral)  Wt 205 lb 12 oz (93.328 kg)  SpO2 97%   CC: ?gout, chest congestion  Subjective:    Patient ID: Randy Kennedy, male    DOB: June 16, 1951, 65 y.o.   MRN: [id number]  HPI: Randy Kennedy is a 65 y.o. male presenting on 10/22/2015 for ? Gout and Chest congestion   Ongoing chest congestion over last 6 weeks. Thinks this started with influenza then bronchitis. Productive cough not going away. + sick contacts at home. + fevers, dyspnea, wheezing. Chest > head congestion. + PNdrainage. Hasn't tried anything for this other than OTC sinus medication. Denies h/o asthma, COPD. Not around smokers. Coughing all night. Cough drops don't help.  R foot pain along with chronic R leg pain. Wondered if gout related so increased colchicine without improvement.   Gout - intolerant to allopurinol - GI upset. Started on uloric 40mg  daily Takes colchicine 0.6mg  once daily. No recent gout flares. Ran out of uloric 1 month ago.  Lab Results  Component Value Date   LABURIC 11.0* 08/07/2015    Lab Results  Component Value Date   CREATININE 1.17 08/07/2015     Periph neuropathy - Unsure cause. Has had 3 EMGs in the past - saw neurologist in [place] regularly. No h/o DM, no h/o EtOH use. Known RLS. Gabapentin helps but causes over sedation and dizziness. Interested in trial lyrica again (previously denied by insurance). Prior saw Dr Dim[facility] [place]. Also takes chronic narcotics for burning foot pain.  20 lb weight loss after healthy diet changes - more salads, stopped coke   Relevant past medical, surgical, family and social history reviewed and updated as indicated. Interim medical history since our last visit reviewed. Allergies and medications reviewed and updated. Current Outpatient Prescriptions on File Prior to Visit  Medication Sig  . aspirin EC 81 MG tablet Take 81 mg by mouth daily.  . chlorthalidone  (HYGROTON) 25 MG tablet Take 1 tablet (25 mg total) by mouth daily.  . colchicine 0.6 MG tablet TAKE ONE TABLET BY MOUTH TWICE DAILY AS NEEDED FOR GOUT  . doxazosin (CARDURA) 8 MG tablet Take 1 tablet (8 mg total) by mouth daily.  . famotidine (PEPCID) 20 MG tablet Take 20 mg by mouth 2 (two) times daily as needed.  . gabapentin (NEURONTIN) 100 MG capsule TAKE THREE CAPSULES BY MOUTH THREE TIMES DAILY  . hydrALAZINE (APRESOLINE) 50 MG tablet Take 50 mg by mouth 3 (three) times daily. Reported on 08/14/2015  . HYDROcodone-acetaminophen (NORCO) 10-325 MG tablet Take 1 tablet by mouth every 6 (six) hours as needed.  [place] lisinopril (PRINIVIL,ZESTRIL) 20 MG tablet Take 1 tablet (20 mg total) by mouth 2 (two) times daily.  . nitroGLYCERIN (NITROSTAT) 0.4 MG SL tablet Place 1 tablet (0.4 mg total) under the tongue every 5 (five) minutes as needed for chest pain.  . pravastatin (PRAVACHOL) 40 MG tablet TAKE ONE TABLET BY MOUTH AT BEDTIME   No current facility-administered medications on file prior to visit.    Review of Systems Per HPI unless specifically indicated in ROS section     Objective:    BP 124/72 mmHg  Pulse 56  Temp(Src) 98.1 F (36.7 C) (Oral)  Wt 205 lb 12 oz (93.328 kg)  SpO2 97%  Wt Readings from Last 3 Encounters:  10/22/15 205 lb 12 oz (93.328 kg)  08/14/15 224 lb 12 oz (101.946 kg)  04/03/15  231 lb 4 oz (104.894 kg)    Physical Exam  Constitutional: He appears well-developed and well-nourished. No distress.  HENT:  Head: Normocephalic and atraumatic.  Right Ear: Hearing, tympanic membrane, external ear and ear canal normal.  Left Ear: Hearing, tympanic membrane, external ear and ear canal normal.  Nose: Nose normal. No mucosal edema or rhinorrhea. Right sinus exhibits no maxillary sinus tenderness and no frontal sinus tenderness. Left sinus exhibits no maxillary sinus tenderness and no frontal sinus tenderness.  Mouth/Throat: Uvula is midline, oropharynx is clear and  moist and mucous membranes are normal. No oropharyngeal exudate, posterior oropharyngeal edema, posterior oropharyngeal erythema or tonsillar abscesses.  Eyes: Conjunctivae and EOM are normal. Pupils are equal, round, and reactive to light. No scleral icterus.  Neck: Normal range of motion. Neck supple.  Cardiovascular: Normal rate, regular rhythm, normal heart sounds and intact distal pulses.   No murmur heard. Pulmonary/Chest: Effort normal and breath sounds normal. No respiratory distress. He has no wheezes. He has no rales.  Musculoskeletal: He exhibits no edema.  2+ DP bilaterally Swelling R 1st MTP with knot medial MTP, tender to palpation dorsal surface as well as medial Hardened callus mid sole L foot  Lymphadenopathy:    He has no cervical adenopathy.  Skin: Skin is warm and dry. No rash noted.  Nursing note and vitals reviewed.  Results for orders placed or performed in visit on 08/07/15  Lipid panel  Result Value Ref Range   Cholesterol 165 0 - 200 mg/dL   Triglycerides 149.0 0.0 - 149.0 mg/dL   HDL 59.00 >39.00 mg/dL   VLDL 29.8 0.0 - 40.0 mg/dL   LDL Cholesterol 77 0 - 99 mg/dL   Total CHOL/HDL Ratio 3    NonHDL 106.42   Comprehensive metabolic panel  Result Value Ref Range   Sodium 142 135 - 145 mEq/L   Potassium 3.8 3.5 - 5.1 mEq/L   Chloride 103 96 - 112 mEq/L   CO2 31 19 - 32 mEq/L   Glucose, Bld 111 (H) 70 - 99 mg/dL   BUN 30 (H) 6 - 23 mg/dL   Creatinine, Ser 1.17 0.40 - 1.50 mg/dL   Total Bilirubin 1.8 (H) 0.2 - 1.2 mg/dL   Alkaline Phosphatase 48 39 - 117 U/L   AST 50 (H) 0 - 37 U/L   ALT 52 0 - 53 U/L   Total Protein 7.0 6.0 - 8.3 g/dL   Albumin 4.5 3.5 - 5.2 g/dL   Calcium 9.8 8.4 - 10.5 mg/dL   GFR 66.57 >60.00 mL/min  PSA  Result Value Ref Range   PSA 0.80 0.10 - 4.00 ng/mL  CBC with Differential/Platelet  Result Value Ref Range   WBC 8.7 4.0 - 10.5 K/uL   RBC 4.96 4.22 - 5.81 Mil/uL   Hemoglobin 15.0 13.0 - 17.0 g/dL   HCT 44.5 39.0 - 52.0 %    MCV 89.6 78.0 - 100.0 fl   MCHC 33.8 30.0 - 36.0 g/dL   RDW 13.5 11.5 - 15.5 %   Platelets 142.0 (L) 150.0 - 400.0 K/uL   Neutrophils Relative % 62.1 43.0 - 77.0 %   Lymphocytes Relative 25.9 12.0 - 46.0 %   Monocytes Relative 8.7 3.0 - 12.0 %   Eosinophils Relative 2.8 0.0 - 5.0 %   Basophils Relative 0.5 0.0 - 3.0 %   Neutro Abs 5.4 1.4 - 7.7 K/uL   Lymphs Abs 2.2 0.7 - 4.0 K/uL   Monocytes Absolute 0.8 0.1 -  1.0 K/uL   Eosinophils Absolute 0.2 0.0 - 0.7 K/uL   Basophils Absolute 0.0 0.0 - 0.1 K/uL  Uric acid  Result Value Ref Range   Uric Acid, Serum 11.0 (H) 4.0 - 7.8 mg/dL      Assessment & Plan:   Problem List Items Addressed This Visit    Podagra - Primary    Anticipate resolving podagra (off uloric for last 1 month). Continue colchicine 1tab daily. Add prednisone taper. Start uloric once gout flare has resolved.       Peripheral neuropathy ([place])    Will again request records from prior neurologist in [facility].      Relevant Medications   pregabalin (LYRICA) 75 MG capsule   Gout    Off uloric for 1 month. Continues colchicine 0.6mg  daily.  Will treat current presumed flare with prednisone course then restart uloric.      Cough    Anticipate post infectious bronchial cough after recent viral URI. Supportive care discussed. Prednisone for podagra may help. Provided with cheratussin cough syrup for night time. Discussed precautions with mixing with hydrocodone.      Bunion    Refer to podiatry for further eval/treatment of bunion and callus      Relevant Orders   Ambulatory referral to Podiatry       Follow up plan: Return if symptoms worsen or fail to improve.

## 2015-10-23 ENCOUNTER — Telehealth: Payer: Self-pay | Admitting: *Deleted

## 2015-10-23 MED ORDER — GUAIFENESIN-CODEINE 100-10 MG/5ML PO SYRP: 5.0000 mL | ORAL_SOLUTION | Freq: Every evening | ORAL | Status: DC | PRN

## 2015-10-23 NOTE — Telephone Encounter (Signed)
plz phone in cheratussin

## 2015-10-23 NOTE — Telephone Encounter (Signed)
Rx called in as directed.   

## 2015-10-23 NOTE — Telephone Encounter (Signed)
Please see Mychart message. I don't see a Rx for cheratussin.

## 2015-10-23 NOTE — Telephone Encounter (Signed)
Lyrica required PA. Completed on [place] My Meds. Will await determination.

## 2015-10-28 NOTE — Telephone Encounter (Signed)
PA approved. Pharmacy notified 

## 2015-10-30 ENCOUNTER — Other Ambulatory Visit: Payer: Self-pay | Admitting: Family Medicine

## 2015-10-31 MED ORDER — HYDROCODONE-ACETAMINOPHEN 10-325 MG PO TABS: 1.0000 | ORAL_TABLET | Freq: Four times a day (QID) | ORAL | Status: DC | PRN

## 2015-10-31 NOTE — Addendum Note (Signed)
Addended by: [name] on: 10/31/2015 08:31 AM   Modules accepted: Orders

## 2015-10-31 NOTE — Telephone Encounter (Signed)
Message left advising patient and Rx placed up front for pick up. 

## 2015-10-31 NOTE — Telephone Encounter (Signed)
Printed and in Kim's box 

## 2015-10-31 NOTE — Telephone Encounter (Signed)
Ok to refill 

## 2015-11-16 ENCOUNTER — Encounter: Payer: Self-pay | Admitting: Family Medicine

## 2015-11-16 DIAGNOSIS — G47419 Narcolepsy without cataplexy: Secondary | ICD-10-CM | POA: Insufficient documentation

## 2015-11-28 ENCOUNTER — Telehealth: Payer: Self-pay

## 2015-11-28 MED ORDER — HYDROCODONE-ACETAMINOPHEN 10-325 MG PO TABS: 1.0000 | ORAL_TABLET | Freq: Four times a day (QID) | ORAL | Status: DC | PRN

## 2015-11-28 NOTE — Telephone Encounter (Signed)
Pt left v/m requesting rx hydrocodone apap. Call when ready for pick up. rx last printed # 100 on 10/31/15. Last seen 08/14/15 annual exam.

## 2015-11-28 NOTE — Telephone Encounter (Signed)
Printed and in Kim's box 

## 2015-11-29 NOTE — Telephone Encounter (Signed)
Pt checking on rx Best number [phone]

## 2015-11-29 NOTE — Telephone Encounter (Signed)
Patient notified and Rx placed up front for pick up. 

## 2015-12-12 DIAGNOSIS — M2011 Hallux valgus (acquired), right foot: Secondary | ICD-10-CM | POA: Diagnosis not present

## 2015-12-12 DIAGNOSIS — M19172 Post-traumatic osteoarthritis, left ankle and foot: Secondary | ICD-10-CM | POA: Diagnosis not present

## 2015-12-12 DIAGNOSIS — M1A9XX1 Chronic gout, unspecified, with tophus (tophi): Secondary | ICD-10-CM | POA: Diagnosis not present

## 2015-12-12 DIAGNOSIS — M79671 Pain in right foot: Secondary | ICD-10-CM | POA: Diagnosis not present

## 2015-12-26 ENCOUNTER — Other Ambulatory Visit: Payer: Self-pay | Admitting: Family Medicine

## 2015-12-26 ENCOUNTER — Other Ambulatory Visit: Payer: Self-pay

## 2015-12-26 MED ORDER — HYDROCODONE-ACETAMINOPHEN 10-325 MG PO TABS: 1.0000 | ORAL_TABLET | Freq: Four times a day (QID) | ORAL | Status: DC | PRN

## 2015-12-26 NOTE — Telephone Encounter (Signed)
Pt left v/m requesting rx hydrocodone apap. Call when ready for pick up. rx last printed # 100 on 11/28/15. Pt last annual exam is 08/14/15.

## 2015-12-26 NOTE — Telephone Encounter (Signed)
Printed and in Kim's box 

## 2015-12-27 NOTE — Telephone Encounter (Addendum)
This was already taken care of.

## 2015-12-27 NOTE — Telephone Encounter (Signed)
Message left advising patient and Rx placed up front for pick up. 

## 2016-01-09 ENCOUNTER — Encounter: Payer: Self-pay | Admitting: Family Medicine

## 2016-01-13 NOTE — Telephone Encounter (Signed)
Rx written and in Kim's box. 

## 2016-01-14 ENCOUNTER — Other Ambulatory Visit: Payer: Self-pay | Admitting: Family Medicine

## 2016-01-14 NOTE — Telephone Encounter (Signed)
Patient notified and Rx placed up front for pick up. 

## 2016-01-22 ENCOUNTER — Other Ambulatory Visit: Payer: Self-pay | Admitting: Family Medicine

## 2016-01-22 NOTE — Telephone Encounter (Signed)
Ok to refill 

## 2016-01-23 MED ORDER — HYDROCODONE-ACETAMINOPHEN 10-325 MG PO TABS: 1.0000 | ORAL_TABLET | Freq: Four times a day (QID) | ORAL | Status: DC | PRN

## 2016-01-23 NOTE — Telephone Encounter (Signed)
Placed at front desk ready for pick up. Pt aware.

## 2016-01-23 NOTE — Telephone Encounter (Signed)
Printed and in Kim's box 

## 2016-02-19 ENCOUNTER — Other Ambulatory Visit: Payer: Self-pay | Admitting: Family Medicine

## 2016-02-20 MED ORDER — HYDROCODONE-ACETAMINOPHEN 10-325 MG PO TABS: 1.0000 | ORAL_TABLET | Freq: Four times a day (QID) | ORAL | 0 refills | Status: DC | PRN

## 2016-02-20 NOTE — Telephone Encounter (Signed)
Printed and in Kim's box 

## 2016-02-20 NOTE — Telephone Encounter (Signed)
Ok to refill 

## 2016-02-21 NOTE — Telephone Encounter (Signed)
Message left advising patient and Rx placed up front for pick up. 

## 2016-02-23 ENCOUNTER — Encounter: Payer: Self-pay | Admitting: Family Medicine

## 2016-02-23 ENCOUNTER — Other Ambulatory Visit: Payer: Self-pay | Admitting: Family Medicine

## 2016-02-23 DIAGNOSIS — Z1211 Encounter for screening for malignant neoplasm of colon: Secondary | ICD-10-CM

## 2016-02-23 DIAGNOSIS — Z800 Family history of malignant neoplasm of digestive organs: Secondary | ICD-10-CM

## 2016-02-24 ENCOUNTER — Telehealth: Payer: Self-pay | Admitting: Family Medicine

## 2016-02-24 NOTE — Telephone Encounter (Signed)
Ok to cancel. Thanks  

## 2016-02-24 NOTE — Telephone Encounter (Signed)
Called the patient to schedule the GI Referral, patient doesn't want the Referral he said for a couple of years. He wants to wait and requests we cancel the Referral right now. Are you okay with cancelling this Referral.

## 2016-03-16 ENCOUNTER — Telehealth: Payer: Self-pay | Admitting: Cardiovascular Disease

## 2016-03-16 NOTE — Telephone Encounter (Signed)
Pt states he has been very sluggish, "cant get out of my own way". States this has been going on for 3 months, but has gotten worse. Wife states his HR when he sleeps is below 40.  States when he is in the heat, he cant move, dizzy, and sluggish. Please call.  States he takes Neurotnin and Norco and not sure if this is causing him to feel this way.

## 2016-03-16 NOTE — Telephone Encounter (Signed)
Yes, please add him onto your list. I will call and give him next available.

## 2016-03-16 NOTE — Telephone Encounter (Signed)
Pt has not been seen by our office since May 2016. He will need an appt for eval. Let me know if I need to put him on my wait list in case of a cancellation.

## 2016-03-16 NOTE — Telephone Encounter (Signed)
Left msg on vm for pt to call back and schedule f/u appt. Advised pt it will be September next available appointment, but will place pt on wait list.

## 2016-03-18 ENCOUNTER — Other Ambulatory Visit: Payer: Self-pay | Admitting: Family Medicine

## 2016-03-19 MED ORDER — HYDROCODONE-ACETAMINOPHEN 10-325 MG PO TABS: 1.0000 | ORAL_TABLET | Freq: Four times a day (QID) | ORAL | 0 refills | Status: DC | PRN

## 2016-03-19 NOTE — Telephone Encounter (Signed)
Patient aware and Rx given to [facility] to give to patient.

## 2016-03-19 NOTE — Telephone Encounter (Signed)
Ok to refill? Last filled 02/20/16 #100 0RF

## 2016-03-19 NOTE — Telephone Encounter (Signed)
Printed and in Kim's box 

## 2016-03-20 ENCOUNTER — Ambulatory Visit (INDEPENDENT_AMBULATORY_CARE_PROVIDER_SITE_OTHER): Payer: Medicare HMO | Admitting: Cardiovascular Disease

## 2016-03-20 ENCOUNTER — Encounter: Payer: Self-pay | Admitting: Cardiovascular Disease

## 2016-03-20 VITALS — BP 126/78 | HR 48 | Ht 67.0 in | Wt 193.2 lb

## 2016-03-20 DIAGNOSIS — R001 Bradycardia, unspecified: Secondary | ICD-10-CM

## 2016-03-20 DIAGNOSIS — I10 Essential (primary) hypertension: Secondary | ICD-10-CM

## 2016-03-20 DIAGNOSIS — G8929 Other chronic pain: Secondary | ICD-10-CM

## 2016-03-20 DIAGNOSIS — G4733 Obstructive sleep apnea (adult) (pediatric): Secondary | ICD-10-CM

## 2016-03-20 DIAGNOSIS — G2581 Restless legs syndrome: Secondary | ICD-10-CM | POA: Diagnosis not present

## 2016-03-20 DIAGNOSIS — R5382 Chronic fatigue, unspecified: Secondary | ICD-10-CM

## 2016-03-20 NOTE — Progress Notes (Signed)
Cardiology Office Note  Date:  03/20/2016   ID:  Randy Kennedy, DOB 07-03-1951, MRN [id number]  PCP:  [name], MD   Chief Complaint  Patient presents with  . Other    C/o fatigue and sob. Meds reviewed verbally with pt.    HPI:  [name] is a  pleasant 65 year old gentleman with a history of RLS, chronic burning in his feet/neuropathy, hypertension, hyperlipidemia, OSA Who has not been using his bipap/cpap, previously seen for chest tightness. Who presents for previous history of bradycardia and hypertension    previously he had Bradycardia and leg edema on calcium channel blockers.  Holding calcium channel blocker has improved both of these.   He presents by himself to today's visit In follow-up today he reports having extreme fatigue Not using CPAP. Has not had any follow-up with sleep physician [place] working outside, in the [place] right now in exam room Not sleeping well Reports blood pressures well controlled at home Heart rate 40s Long history of bradycardia  Meds that he takes at home; Chlorthalidone 1/2 pill Lisinopril 20 mg once a day cardura 1/2 pill No hydralazine Several medications compared to his previous clinic visit Denies any significant chest pain for shortness of breath. She is sedentary, no regular exercise, weight has been trending upwards.   gabapenting for nerves: 700   2x a day  Lab work reviewed total chol 165, LDL 77  Chronic legs and arm numbness and tingling.  Previously reported having fatigue on gabapentin  Wife has to drive all the time. Previously denied any orthostatic type symptoms.  EKG on today's visit shows sinus bradycardia with rate 48 bpm, no significant ST or T-wave changes    other past medical history  Reports that he had a negative stress test in 2013. This was a pharmacologic Myoview dated May 2013. He was unable to treadmill He was seen previously by cardiology in [place]. Notes indicate  history of difficult to control blood pressure, hyperlipidemia, obesity, disabled. No smoking history He was started several years on Procardia XL 30 mg daily. This was increased in [place]  Previous episode of chest tightness while he was swimming in a pool. He had to hold onto the side of the pole. Mild dizziness.   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  PSH:    Past Surgical History:  Procedure Laterality Date  . COLONOSCOPY  02/2011   ext hem, benign polyp, rpt 5 yrs ([place])  . LEG SURGERY  x5   left - after fall at work (3.5 stories)  . MANDIBLE SURGERY  1965   jaw fracture - horse kick  . 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 chlorthalidone (HYGROTON) 25 MG tablet Take 1 tablet (25 mg total) by mouth daily. 90 tablet 3  . colchicine 0.6 MG tablet TAKE ONE TABLET BY MOUTH TWICE DAILY AS NEEDED FOR GOUT 180 tablet 3  . doxazosin (CARDURA) 8 MG tablet Take  1 tablet (8 mg total) by mouth daily. 90 tablet 3  . gabapentin (NEURONTIN) 100 MG capsule TAKE THREE CAPSULES BY MOUTH THREE TIMES DAILY 270 capsule 3  . HYDROcodone-acetaminophen (NORCO) 10-325 MG tablet Take 1 tablet by mouth every 6 (six) hours as needed. 100 tablet 0  . lisinopril (PRINIVIL,ZESTRIL) 20 MG tablet Take 1 tablet (20 mg total) by mouth 2 (two) times daily. 180 tablet 3  .  nitroGLYCERIN (NITROSTAT) 0.4 MG SL tablet Place 1 tablet (0.4 mg total) under the tongue every 5 (five) minutes as needed for chest pain. 25 tablet 3  . pravastatin (PRAVACHOL) 40 MG tablet TAKE ONE TABLET BY MOUTH AT BEDTIME 90 tablet 3   No current facility-administered medications for this visit.      Allergies:   Allopurinol; Lisinopril-hydrochlorothiazide; Procardia [nifedipine]; and Amlodipine   Social History:  The patient  reports that he quit smoking about 37 years ago. His smoking use included Pipe. He has a 4.00 pack-year smoking history. He has never used smokeless tobacco. He reports that he does not drink alcohol or use drugs.   Family History:   family history includes Cancer in his father, mother, and paternal uncle; Hypertension in his mother.    Review of Systems: Review of Systems  Constitutional: Positive for malaise/fatigue.  Respiratory: Negative.   Cardiovascular: Negative.   Gastrointestinal: Negative.   Musculoskeletal: Negative.   Neurological: Negative.        Numbness in his legs and arms, tingling  Psychiatric/Behavioral: Negative.   All other systems reviewed and are negative.    PHYSICAL EXAM: VS:  BP 126/78 (BP Location: Left Arm, Patient Position: Sitting, Cuff Size: Normal)   Pulse (!) 48   Ht 5\' 7"  (1.702 m)   Wt 193 lb 4 oz (87.7 kg)   BMI 30.27 kg/m  , BMI Body mass index is 30.27 kg/m. GEN: Well nourished, well developed, in no acute distress  HEENT: normal  Neck: no JVD, carotid bruits, or masses Cardiac: RRR; no murmurs, rubs, or gallops,no edema  Respiratory:  clear to auscultation bilaterally, normal work of breathing GI: soft, nontender, nondistended, + BS MS: no deformity or atrophy  Skin: warm and dry, no rash Neuro:  Strength and sensation are intact Psych: euthymic mood, full affect    Recent Labs: 08/07/2015: ALT 52; BUN 30; Creatinine, Ser 1.17; Hemoglobin 15.0; Platelets 142.0; Potassium 3.8; Sodium 142    Lipid  Panel Lab Results  Component Value Date   CHOL 165 08/07/2015   HDL 59.00 08/07/2015   LDLCALC 77 08/07/2015   TRIG 149.0 08/07/2015      Wt Readings from Last 3 Encounters:  03/20/16 193 lb 4 oz (87.7 kg)  10/22/15 205 lb 12 oz (93.3 kg)  08/14/15 224 lb 12 oz (101.9 kg)       ASSESSMENT AND PLAN:  Essential hypertension - Plan: EKG 12-Lead Blood pressure is well controlled on today's visit. No changes made to the medications.  RLS (restless legs syndrome) - Plan: EKG 12-Lead Managed with gabapentin, though in the past is made him very with significant fatigue  Chronic pain - Plan: EKG 12-Lead Managed with gabapentin  Bradycardia - Plan: EKG 12-Lead In the past has been asymptomatic Blood pressure stable, no significant orthostasis symptoms  OSA (obstructive sleep apnea) - Plan: EKG 12-Lead Recommended he follow-up with sleep physician Currently not using his CPAP/BIPAP, likely contributing to his fatigue and insomnia, poor sleep hygiene  Fatigue Likely multifactorial. Her deconditioned at baseline Needs  attention to his apnea   Total encounter time more than 25 minutes  Greater than 50% was spent in counseling and coordination of care with the patient   Disposition:   F/U  12 months   Orders Placed This Encounter  Procedures  . EKG 12-Lead     Signed, Esmond Plants, M.D., Ph.D. 03/20/2016  [place], [place]

## 2016-03-20 NOTE — Patient Instructions (Addendum)
Medication Instructions:   Please hold the cardura/doxazosin  This may help dizziness and energy  Labwork:  No new labs   Testing/Procedures:  If extreme fatigue persists after fixing CPAP, Call the office We could do a holter monitor   Follow-Up: It was a pleasure seeing you in the office today. Please call us if you have new issues that need to be addressed before your next appt.  [phone]  Your physician wants you to follow-up in: 12 months.  You will receive a reminder letter in the mail two months in advance. If you don't receive a letter, please call our office to schedule the follow-up appointment.  If you need a refill on your cardiac medications before your next appointment, please call your pharmacy.

## 2016-03-23 ENCOUNTER — Ambulatory Visit: Payer: Medicare HMO | Admitting: Family Medicine

## 2016-03-24 ENCOUNTER — Ambulatory Visit (INDEPENDENT_AMBULATORY_CARE_PROVIDER_SITE_OTHER): Payer: Medicare HMO | Admitting: Family Medicine

## 2016-03-24 ENCOUNTER — Encounter: Payer: Self-pay | Admitting: Family Medicine

## 2016-03-24 VITALS — BP 132/82 | HR 48 | Temp 97.3°F | Wt 193.5 lb

## 2016-03-24 DIAGNOSIS — Z23 Encounter for immunization: Secondary | ICD-10-CM | POA: Diagnosis not present

## 2016-03-24 DIAGNOSIS — G4733 Obstructive sleep apnea (adult) (pediatric): Secondary | ICD-10-CM | POA: Diagnosis not present

## 2016-03-24 DIAGNOSIS — R5382 Chronic fatigue, unspecified: Secondary | ICD-10-CM | POA: Diagnosis not present

## 2016-03-24 DIAGNOSIS — R42 Dizziness and giddiness: Secondary | ICD-10-CM

## 2016-03-24 LAB — COMPREHENSIVE METABOLIC PANEL
ALBUMIN: 4.6 g/dL (ref 3.5–5.2)
ALT: 20 U/L (ref 0–53)
AST: 21 U/L (ref 0–37)
Alkaline Phosphatase: 62 U/L (ref 39–117)
BUN: 21 mg/dL (ref 6–23)
CALCIUM: 9.5 mg/dL (ref 8.4–10.5)
CHLORIDE: 103 meq/L (ref 96–112)
CO2: 33 meq/L — AB (ref 19–32)
CREATININE: 1.07 mg/dL (ref 0.40–1.50)
GFR: 73.65 mL/min (ref >60.00)
Glucose, Bld: 106 mg/dL — ABNORMAL HIGH (ref 70–99)
Potassium: 4 mEq/L (ref 3.5–5.1)
Sodium: 141 mEq/L (ref 135–145)
Total Bilirubin: 1.3 mg/dL — ABNORMAL HIGH (ref 0.2–1.2)
Total Protein: 7.5 g/dL (ref 6.0–8.3)

## 2016-03-24 LAB — CBC WITH DIFFERENTIAL/PLATELET
BASOS PCT: 0.6 % (ref 0.0–3.0)
Basophils Absolute: 0.1 10*3/uL (ref 0.0–0.1)
EOS ABS: 0.3 10*3/uL (ref 0.0–0.7)
EOS PCT: 2.6 % (ref 0.0–5.0)
HEMATOCRIT: 44 % (ref 39.0–52.0)
HEMOGLOBIN: 15.1 g/dL (ref 13.0–17.0)
LYMPHS PCT: 23 % (ref 12.0–46.0)
Lymphs Abs: 2.2 10*3/uL (ref 0.7–4.0)
MCHC: 34.2 g/dL (ref 30.0–36.0)
MCV: 89 fl (ref 78.0–100.0)
MONO ABS: 0.6 10*3/uL (ref 0.1–1.0)
Monocytes Relative: 6.6 % (ref 3.0–12.0)
Neutro Abs: 6.4 10*3/uL (ref 1.4–7.7)
Neutrophils Relative %: 67.2 % (ref 43.0–77.0)
Platelets: 164 10*3/uL (ref 150.0–400.0)
RBC: 4.95 Mil/uL (ref 4.22–5.81)
RDW: 13.1 % (ref 11.5–15.5)
WBC: 9.5 10*3/uL (ref 4.0–10.5)

## 2016-03-24 LAB — TSH: TSH: 0.8 u[IU]/mL (ref 0.35–4.50)

## 2016-03-24 LAB — VITAMIN B12: VITAMIN B 12: 453 pg/mL (ref 211–911)

## 2016-03-24 NOTE — Progress Notes (Signed)
BP 132/82 (BP Location: Right Arm, Cuff Size: Normal)   Pulse (!) 48   Temp 97.3 F (36.3 C) (Oral)   Wt 193 lb 8 oz (87.8 kg)   BMI 30.31 kg/m    CC: dizziness Subjective:    Patient ID: Randy Kennedy, male    DOB: May 24, 1951, 65 y.o.   MRN: [id number]  HPI: Randy Kennedy is a 65 y.o. male presenting on 03/24/2016 for Dizziness   Ongoing dizziness/fatigue over last 2 months. Dizziness described as shaking sensation and some "room spinning" worse in heat and worse with quickly standing. Episodes last all day long. No presyncope/syncope. Denies chest pain, dyspnea, palpitations, confusion, slurred speech or unilateral weakness. Occasional nausea, tinnitus. No vomiting, abd pain. No new hearing loss. Known chronic L hearing loss. Chronic L eye drooping. No headache, not worse with prolonged upward gaze.  Currently taking gabapentin total 700mg  daily (300/400mg ).   Saw cards last week - noted bradycardia. Discussed possible EP referral.  OSA - still does not have CPAP. Yesterday pt contacted DME company to reschedule.  No recent gout flare.   Weight loss- 12 lbs down in last 6 months.   Relevant past medical, surgical, family and social history reviewed and updated as indicated. Interim medical history since our last visit reviewed. Allergies and medications reviewed and updated. Current Outpatient Prescriptions on File Prior to Visit  Medication Sig  . chlorthalidone (HYGROTON) 25 MG tablet Take 1 tablet (25 mg total) by mouth daily.  . colchicine 0.6 MG tablet TAKE ONE TABLET BY MOUTH TWICE DAILY AS NEEDED FOR GOUT  . HYDROcodone-acetaminophen (NORCO) 10-325 MG tablet Take 1 tablet by mouth every 6 (six) hours as needed.  Marland Kitchen lisinopril (PRINIVIL,ZESTRIL) 20 MG tablet Take 1 tablet (20 mg total) by mouth 2 (two) times daily.  . nitroGLYCERIN (NITROSTAT) 0.4 MG SL tablet Place 1 tablet (0.4 mg total) under the tongue every 5 (five) minutes as needed for chest pain.  .  pravastatin (PRAVACHOL) 40 MG tablet TAKE ONE TABLET BY MOUTH AT BEDTIME   No current facility-administered medications on file prior to visit.     Review of Systems Per HPI unless specifically indicated in ROS section     Objective:    BP 132/82 (BP Location: Right Arm, Cuff Size: Normal)   Pulse (!) 48   Temp 97.3 F (36.3 C) (Oral)   Wt 193 lb 8 oz (87.8 kg)   BMI 30.31 kg/m   Wt Readings from Last 3 Encounters:  03/24/16 193 lb 8 oz (87.8 kg)  03/20/16 193 lb 4 oz (87.7 kg)  10/22/15 205 lb 12 oz (93.3 kg)    Physical Exam  Constitutional: He is oriented to person, place, and time. He appears well-developed and well-nourished. No distress.  HENT:  Mouth/Throat: Oropharynx is clear and moist. No oropharyngeal exudate.  Eyes: Conjunctivae are normal. Pupils are equal, round, and reactive to light. No scleral icterus.  Neck: Normal range of motion. Neck supple. Carotid bruit is not present.  Cardiovascular: Normal rate, regular rhythm, normal heart sounds and intact distal pulses.   No murmur heard. Pulmonary/Chest: Effort normal and breath sounds normal. No respiratory distress. He has no wheezes. He has no rales.  Musculoskeletal: He exhibits no edema.  Lymphadenopathy:    He has no cervical adenopathy.  Neurological: He is alert and oriented to person, place, and time. He has normal strength. A cranial nerve deficit is present. No sensory deficit. He displays a negative Romberg sign. Coordination and  gait normal.  CN 2-12 intact except for difficulty with L eye up/in gaze. Chronic drooping of L eyelid Dix hallpike negative Walks with cane  Skin: Skin is warm and dry. No rash noted.  Nursing note and vitals reviewed.  Results for orders placed or performed in visit on 08/07/15  Lipid panel  Result Value Ref Range   Cholesterol 165 0 - 200 mg/dL   Triglycerides 149.0 0.0 - 149.0 mg/dL   HDL 59.00 >39.00 mg/dL   VLDL 29.8 0.0 - 40.0 mg/dL   LDL Cholesterol 77 0 - 99  mg/dL   Total CHOL/HDL Ratio 3    NonHDL 106.42   Comprehensive metabolic panel  Result Value Ref Range   Sodium 142 135 - 145 mEq/L   Potassium 3.8 3.5 - 5.1 mEq/L   Chloride 103 96 - 112 mEq/L   CO2 31 19 - 32 mEq/L   Glucose, Bld 111 (H) 70 - 99 mg/dL   BUN 30 (H) 6 - 23 mg/dL   Creatinine, Ser 1.17 0.40 - 1.50 mg/dL   Total Bilirubin 1.8 (H) 0.2 - 1.2 mg/dL   Alkaline Phosphatase 48 39 - 117 U/L   AST 50 (H) 0 - 37 U/L   ALT 52 0 - 53 U/L   Total Protein 7.0 6.0 - 8.3 g/dL   Albumin 4.5 3.5 - 5.2 g/dL   Calcium 9.8 8.4 - 10.5 mg/dL   GFR 66.57 >60.00 mL/min  PSA  Result Value Ref Range   PSA 0.80 0.10 - 4.00 ng/mL  CBC with Differential/Platelet  Result Value Ref Range   WBC 8.7 4.0 - 10.5 K/uL   RBC 4.96 4.22 - 5.81 Mil/uL   Hemoglobin 15.0 13.0 - 17.0 g/dL   HCT 44.5 39.0 - 52.0 %   MCV 89.6 78.0 - 100.0 fl   MCHC 33.8 30.0 - 36.0 g/dL   RDW 13.5 11.5 - 15.5 %   Platelets 142.0 (L) 150.0 - 400.0 K/uL   Neutrophils Relative % 62.1 43.0 - 77.0 %   Lymphocytes Relative 25.9 12.0 - 46.0 %   Monocytes Relative 8.7 3.0 - 12.0 %   Eosinophils Relative 2.8 0.0 - 5.0 %   Basophils Relative 0.5 0.0 - 3.0 %   Neutro Abs 5.4 1.4 - 7.7 K/uL   Lymphs Abs 2.2 0.7 - 4.0 K/uL   Monocytes Absolute 0.8 0.1 - 1.0 K/uL   Eosinophils Absolute 0.2 0.0 - 0.7 K/uL   Basophils Absolute 0.0 0.0 - 0.1 K/uL  Uric acid  Result Value Ref Range   Uric Acid, Serum 11.0 (H) 4.0 - 7.8 mg/dL      Assessment & Plan:   Problem List Items Addressed This Visit    Chronic fatigue    Pt working towards restarting CPAP use for his OSA.       Relevant Orders   Vitamin B12   Dizziness - Primary    ?gabapentin related (recently increasing dose) - will decrease back to 200mg  BID and monitor for improvement.  Exam and episode duration not consistent with BPPV, does not have symptoms of vertebral insufficiency.  Possible trochlear nerve palsy seen today, however unknown chronicity of this finding.    Will further evaluate with labs (CBC, CMP, TSH) and update with results of evaluation. Pt agrees with plan.      Relevant Orders   Comprehensive metabolic panel   TSH   CBC with Differential/Platelet   OSA (obstructive sleep apnea)    Other Visit Diagnoses  Need for influenza vaccination       Relevant Orders   Flu Vaccine QUAD 36+ mos PF IM (Fluarix & Fluzone Quad PF) (Completed)       Follow up plan: Return if symptoms worsen or fail to improve.  [name], MD

## 2016-03-24 NOTE — Assessment & Plan Note (Addendum)
Pt working towards restarting CPAP use for his OSA.

## 2016-03-24 NOTE — Assessment & Plan Note (Signed)
?  gabapentin related (recently increasing dose) - will decrease back to 200mg  BID and monitor for improvement.  Exam and episode duration not consistent with BPPV, does not have symptoms of vertebral insufficiency.  Possible trochlear nerve palsy seen today, however unknown chronicity of this finding.  Will further evaluate with labs (CBC, CMP, TSH) and update with results of evaluation. Pt agrees with plan.

## 2016-03-24 NOTE — Progress Notes (Signed)
Pre visit review using our clinic review tool, if applicable. No additional management support is needed unless otherwise documented below in the visit note. 

## 2016-03-24 NOTE — Patient Instructions (Addendum)
Decrease gabapentin to 200mg  twice daily for 1 week to see if improved dizziness/fatigue labwork today to check for other reversible causes of fatigue. We will be in touch with results. Flu shot today

## 2016-04-10 ENCOUNTER — Ambulatory Visit: Payer: Medicare HMO | Admitting: Cardiovascular Disease

## 2016-04-15 ENCOUNTER — Other Ambulatory Visit: Payer: Self-pay | Admitting: Family Medicine

## 2016-04-15 NOTE — Telephone Encounter (Signed)
Last office visit 03/24/2016.  Last refilled 03/19/2016.

## 2016-04-16 MED ORDER — HYDROCODONE-ACETAMINOPHEN 10-325 MG PO TABS: 1.0000 | ORAL_TABLET | Freq: Four times a day (QID) | ORAL | 0 refills | Status: DC | PRN

## 2016-04-16 NOTE — Telephone Encounter (Signed)
Printed and in Kim's box 

## 2016-04-16 NOTE — Telephone Encounter (Signed)
Pt left v/m requesting cb about hydrocodone apap rx. Pt request to pick up rx on 04/17/16 in the AM.Please advise.

## 2016-04-17 NOTE — Telephone Encounter (Signed)
Rx given to Randy Kennedy to give to patient.

## 2016-04-27 ENCOUNTER — Other Ambulatory Visit: Payer: Self-pay | Admitting: *Deleted

## 2016-04-27 MED ORDER — PRAVASTATIN SODIUM 40 MG PO TABS: ORAL_TABLET | ORAL | 0 refills | Status: DC

## 2016-05-13 ENCOUNTER — Other Ambulatory Visit: Payer: Self-pay | Admitting: Family Medicine

## 2016-05-13 MED ORDER — HYDROCODONE-ACETAMINOPHEN 10-325 MG PO TABS: 1.0000 | ORAL_TABLET | Freq: Four times a day (QID) | ORAL | 0 refills | Status: DC | PRN

## 2016-05-13 NOTE — Telephone Encounter (Signed)
Ok to refill? Last filled 04/16/16 #100 0RF

## 2016-05-13 NOTE — Telephone Encounter (Signed)
Printed and in Kim's box 

## 2016-05-14 NOTE — Telephone Encounter (Signed)
Message left advising patient and Rx placed up front for pick up. 

## 2016-05-25 DIAGNOSIS — H43813 Vitreous degeneration, bilateral: Secondary | ICD-10-CM | POA: Diagnosis not present

## 2016-06-05 ENCOUNTER — Other Ambulatory Visit: Payer: Self-pay | Admitting: Family Medicine

## 2016-06-05 MED ORDER — HYDROCODONE-ACETAMINOPHEN 10-325 MG PO TABS: 1.0000 | ORAL_TABLET | Freq: Four times a day (QID) | ORAL | 0 refills | Status: DC | PRN

## 2016-06-05 NOTE — Telephone Encounter (Signed)
Ok to refill? Last filled 05/13/16 #100 0RF

## 2016-06-05 NOTE — Telephone Encounter (Signed)
Patient notified and Rx placed up front for pick up. 

## 2016-06-05 NOTE — Telephone Encounter (Signed)
Printed and in Kim's box 

## 2016-06-08 ENCOUNTER — Encounter: Payer: Self-pay | Admitting: Family Medicine

## 2016-06-08 DIAGNOSIS — S99911A Unspecified injury of right ankle, initial encounter: Secondary | ICD-10-CM | POA: Diagnosis not present

## 2016-06-08 DIAGNOSIS — S93401A Sprain of unspecified ligament of right ankle, initial encounter: Secondary | ICD-10-CM | POA: Diagnosis not present

## 2016-06-17 ENCOUNTER — Ambulatory Visit (INDEPENDENT_AMBULATORY_CARE_PROVIDER_SITE_OTHER): Payer: Medicare HMO | Admitting: Family Medicine

## 2016-06-17 ENCOUNTER — Ambulatory Visit (INDEPENDENT_AMBULATORY_CARE_PROVIDER_SITE_OTHER)
Admission: RE | Admit: 2016-06-17 | Discharge: 2016-06-17 | Disposition: A | Discharge status: Home / Self Care | Payer: Medicare HMO | Source: Ambulatory Visit | Attending: Family Medicine | Admitting: Family Medicine

## 2016-06-17 VITALS — BP 140/80 | HR 53 | Wt 195.0 lb

## 2016-06-17 DIAGNOSIS — S99911A Unspecified injury of right ankle, initial encounter: Secondary | ICD-10-CM

## 2016-06-17 DIAGNOSIS — M25571 Pain in right ankle and joints of right foot: Secondary | ICD-10-CM

## 2016-06-17 DIAGNOSIS — S92154A Nondisplaced avulsion fracture (chip fracture) of right talus, initial encounter for closed fracture: Secondary | ICD-10-CM | POA: Diagnosis not present

## 2016-06-17 NOTE — Progress Notes (Signed)
Subjective:    Patient ID: Randy Kennedy, male    DOB: 1950-12-14, 65 y.o.   MRN: [id number]  HPI This is a 65 yo male who presents today for follow up of right  He slipped down on last three steps 65/12/17. He had numbness. He went to [facility] the next day and had xrays and was told that there was not any fracture. He was given a tall boot which he has been wearing. No swelling or bruising after injury. Taking Alleve 1 every 8 hours. Pain with walking or sitting. Pain is achy. He has a history of chronic left ankle and hip pain, RSD and traumatic right ankle arthrosis.   He and his wife are taking care of 4 grandchildren, ranging in age from 1-7. His daughter is going through a divorce.   Past Medical History:  Diagnosis Date  . Allergic rhinitis   . Arthritis   . Cardiac murmur   . Chronic pain    L ankle and bilateral hips (remote fracture s/p surgeries), lower back (told has herniated disc)  . Eczema   . Erectile dysfunction 09/14/2012  . Gilbert disease   . Gout 2007  . Hearing loss    otosclerosis  . Heartburn    mild, controlled with pepcid  . History of chicken pox   . 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 HLD (hyperlipidemia)   . HTN (hypertension)    difficult to control - clonidine and beta blockers caused bradycardia  . 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 OSA (obstructive sleep apnea) 11/2011 sleep study   moderate, AHI 37.4 increased to 80 in REM, on BiPAP 14/10, 97% compliance >4 hrs (06/2013)  . RLS (restless legs syndrome)   . 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 Treated with vicodin, prior tried fentanyl and butrans.   Past Surgical History:  Procedure Laterality Date  . COLONOSCOPY  02/2011   ext hem, benign polyp, rpt 5 yrs ([place])  . LEG SURGERY   x5   left - after fall at work (3.5 stories)  . MANDIBLE SURGERY  1965   jaw fracture - horse kick  . TYMPANIC MEMBRANE REPAIR Left 1994   otosclerosis   Family History  Problem Relation Age of Onset  . Cancer Father     prostate  . Cancer Mother     rectal  . Hypertension Mother   . Cancer Paternal Uncle     prostate  . Diabetes Neg Hx   . Stroke Neg Hx   . CAD Neg Hx       Review of Systems Per HPI    Objective:   Physical Exam  Constitutional: He is oriented to person, place, and time. He appears well-developed and well-nourished. No distress.  Obese.   HENT:  Head: Normocephalic and atraumatic.  Eyes: Conjunctivae are normal.  Pulmonary/Chest: Effort normal.  Musculoskeletal:       Right ankle: He exhibits decreased range of motion. He exhibits no swelling, no ecchymosis and no deformity.       Feet:  Neurological: He is alert and oriented to person, place, and time.  Skin: Skin is warm and dry. He is not diaphoretic.  Psychiatric: He has a normal mood and affect. His behavior is  normal. Judgment and thought content normal.  Vitals reviewed.     BP 140/80   Pulse (!) 53   Wt 195 lb (88.5 kg)   SpO2 97%   BMI 30.54 kg/m  Wt Readings from Last 3 Encounters:  06/17/16 195 lb (88.5 kg)  03/24/16 193 lb 8 oz (87.8 kg)  03/20/16 193 lb 4 oz (87.7 kg)  Dg Ankle Complete Right  Result Date: 06/17/2016 CLINICAL DATA:  RIGHT ankle injury June 07, 2016. Continued pain. EXAM: RIGHT ANKLE - COMPLETE 3+ VIEW COMPARISON:  None available for comparison at time of study interpretation. FINDINGS: No fracture deformity nor dislocation. Tiny corticated bony fragment contiguous with the anterior superior talus. The ankle mortise appears congruent and the tibiofibular syndesmosis intact. No destructive bony lesions. Minimal lateral ankle soft tissue swelling without subcutaneous gas or radiopaque foreign bodies. IMPRESSION: Age indeterminate avulsion fracture anterior  superior talus. Recommend correlation with point tenderness. No dislocation. Electronically Signed   By: Elon Alas M.D.   On: 06/17/2016 13:38        Assessment & Plan:  1. Injury of right ankle, initial encounter - DG Ankle Complete Right; Future  2. Acute right ankle pain - DG Ankle Complete Right; Future  3. Closed nondisplaced avulsion fracture of right talus, initial encounter - Discussed with Dr. Danise Mina - continue cam walker for 3-5 weeks - will have him follow up with Dr. Lorelei Pont in 3 weeks, sooner if worsening symptoms Clarene Reamer, FNP-BC  [facility], [place]  06/17/2016 2:03 PM

## 2016-06-17 NOTE — Patient Instructions (Signed)
Please continue to wear the boot for another 3-5 weeks while you are having pain Make an appointment to see Dr. Lorelei Pont in 3-4 weeks

## 2016-06-26 DIAGNOSIS — S92151A Displaced avulsion fracture (chip fracture) of right talus, initial encounter for closed fracture: Secondary | ICD-10-CM

## 2016-06-26 HISTORY — DX: Displaced avulsion fracture (chip fracture) of right talus, initial encounter for closed fracture: S92.151A

## 2016-07-03 ENCOUNTER — Other Ambulatory Visit: Payer: Self-pay | Admitting: Family Medicine

## 2016-07-03 MED ORDER — HYDROCODONE-ACETAMINOPHEN 10-325 MG PO TABS: 1.0000 | ORAL_TABLET | Freq: Four times a day (QID) | ORAL | 0 refills | Status: DC | PRN

## 2016-07-03 NOTE — Telephone Encounter (Signed)
Ok to refill in Dr. [name] absence? Last filled 06/05/16 #100 0RF. Please return to me. Thanks!

## 2016-07-03 NOTE — Telephone Encounter (Signed)
Px printed for pick up in IN box  

## 2016-07-03 NOTE — Telephone Encounter (Signed)
Lm on pts vm and informed him Rx is available for pickup from the front desk 

## 2016-07-15 ENCOUNTER — Encounter: Payer: Self-pay | Admitting: Family Medicine

## 2016-07-15 ENCOUNTER — Ambulatory Visit (INDEPENDENT_AMBULATORY_CARE_PROVIDER_SITE_OTHER): Payer: Medicare HMO | Admitting: Family Medicine

## 2016-07-15 VITALS — BP 124/70 | HR 52 | Temp 98.5°F | Ht 67.0 in | Wt 215.5 lb

## 2016-07-15 DIAGNOSIS — S92154D Nondisplaced avulsion fracture (chip fracture) of right talus, subsequent encounter for fracture with routine healing: Secondary | ICD-10-CM

## 2016-07-15 DIAGNOSIS — G90522 Complex regional pain syndrome I of left lower limb: Secondary | ICD-10-CM | POA: Diagnosis not present

## 2016-07-15 NOTE — Progress Notes (Signed)
Pre visit review using our clinic review tool, if applicable. No additional management support is needed unless otherwise documented below in the visit note. 

## 2016-07-15 NOTE — Progress Notes (Signed)
Dr. Frederico Hamman T. Marriah Sanderlin, MD, [facility] [place], [address] Phone: [phone] Fax: [phone]  07/15/2016  Patient: Randy Kennedy, MRN: [id number], DOB: 1951-05-20, 65 y.o.  Primary Physician:  [name], MD   Chief Complaint  Patient presents with  . Follow-up    Right Ankle Fx   Subjective:   Randy Kennedy is a 65 y.o. very pleasant male patient who presents with the following:  F/u talar avulsion fracture.  DOI 06/07/2016  5 weeks ago, initially went to [facility], then followed up with Mrs. Carlean Purl.  Small avulsion fracture of the talus was found on plain films.  Pain has been improving. No swelling of the ankle joint. No bruising.   Past Medical History, Surgical History, Social History, Family History, Problem List, Medications, and Allergies have been reviewed and updated if relevant.  Patient Active Problem List   Diagnosis Date Noted  . Dizziness 03/24/2016  . Chronic fatigue 03/20/2016  . Narcolepsy   . 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 Bunion 10/22/2015  . Cough 10/22/2015  . Medicare annual wellness visit, initial 08/14/2015  . Health maintenance examination 08/14/2015  . Advanced care planning/counseling discussion 08/14/2015  . Thrombocytopenia ([place]) 08/05/2015  . 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 History of ankle fracture 07/19/2013  . Drooping eyelid 03/09/2013  . Leg edema 02/16/2013  . Bradycardia 02/16/2013  . Erectile dysfunction 09/14/2012  . Gout 09/13/2012  . GERD (gastroesophageal reflux disease)   . HTN (hypertension)   . HLD (hyperlipidemia)   . OSA (obstructive sleep apnea)   . RLS (restless legs syndrome)   . Gilbert disease   . Chronic pain   . Cardiac murmur     Past Medical History:  Diagnosis Date  . Allergic rhinitis   . Arthritis   . Cardiac murmur   . Chronic pain    L ankle and bilateral hips (remote fracture s/p surgeries), lower back (told has herniated  disc)  . Eczema   . Erectile dysfunction 09/14/2012  . Gilbert disease   . Gout 2007  . Hearing loss    otosclerosis  . Heartburn    mild, controlled with pepcid  . History of chicken pox   . 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 HLD (hyperlipidemia)   . HTN (hypertension)    difficult to control - clonidine and beta blockers caused bradycardia  . 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 OSA (obstructive sleep apnea) 11/2011 sleep study   moderate, AHI 37.4 increased to 80 in REM, on BiPAP 14/10, 97% compliance >4 hrs (06/2013)  . RLS (restless legs syndrome)   . 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 Treated with vicodin, prior tried fentanyl and butrans.    Past Surgical History:  Procedure Laterality Date  . COLONOSCOPY  02/2011   ext hem, benign polyp, rpt 5 yrs ([place])  . LEG SURGERY  x5   left - after fall at work (3.5 stories)  . MANDIBLE SURGERY  1965   jaw fracture - horse kick  . TYMPANIC MEMBRANE REPAIR Left 1994   otosclerosis    Social History   Social History  . Marital status: Married    Spouse name: N/A  . Number of children:  N/A  . Years of education: N/A   Occupational History  . Not on file.   Social History Main Topics  . Smoking status: Former Smoker    Packs/day: 1.00    Years: 4.00    Types: Pipe    Quit date: 07/27/1978  . Smokeless tobacco: Never Used  . Alcohol use No  . Drug use: No  . Sexual activity: Not on file   Other Topics Concern  . Not on file   Social History Narrative   Caffeine: 2 cans coke/day   Lives with wife and grown son    Occupation: retired, was Agricultural engineer.   On disability for chronic pain   Edu: MBA   Activity: no regular exercise   Deit: good water, fruits/vegetables daily    Family History   Problem Relation Age of Onset  . Cancer Father     prostate  . Cancer Mother     rectal  . Hypertension Mother   . Cancer Paternal Uncle     prostate  . Diabetes Neg Hx   . Stroke Neg Hx   . CAD Neg Hx     Allergies  Allergen Reactions  . Allopurinol Other (See Comments)    GI upset  . Lisinopril-Hydrochlorothiazide     Burning in feet.  . Procardia [Nifedipine] Other (See Comments)    Bradycardia, leg swelling  . Amlodipine Rash    BLE    Medication list reviewed and updated in full in [place].  GEN: No fevers, chills. Nontoxic. Primarily MSK c/o today. MSK: Detailed in the HPI GI: tolerating PO intake without difficulty Neuro: No numbness, parasthesias, or tingling associated. Otherwise the pertinent positives of the ROS are noted above.   Objective:   BP 124/70   Pulse (!) 52   Temp 98.5 F (36.9 C) (Oral)   Ht 5\' 7"  (1.702 m)   Wt 215 lb 8 oz (97.8 kg)   BMI 33.75 kg/m    GEN: Well-developed,well-nourished,in no acute distress; alert,appropriate and cooperative throughout examination HEENT: Normocephalic and atraumatic without obvious abnormalities. Ears, externally no deformities PULM: Breathing comfortably in no respiratory distress EXT: No clubbing, cyanosis, or edema PSYCH: Normally interactive. Cooperative during the interview. Pleasant. Friendly and conversant. Not anxious or depressed appearing. Normal, full affect.  ANKLE: R Echymosis: no Edema: no ROM: 40% LOSS OF MOTION, WORSE INVERSION AND EVERSION Gait: heel toe, antalgia Lateral Mall: NT Medial Mall: NT Talus: TENDER AT ANTEROLATERAL ASPECT OF THE TALUS Navicular: NT Cuboid: NT Calcaneous: NT Metatarsals: NT 5th MT: NT Phalanges: NT Achilles: NT Plantar Fascia: NT Fat Pad: NT Peroneals: NT Post Tib: NT Great Toe: decreased motion, ttp Ant Drawer: neg Talar Tilt: neg ATFL: NT CFL: NT Deltoid: NT Str: 5/5 Other Special tests: none Sensation: intact    Radiology: Dg Ankle Complete Right  Result Date: 06/17/2016 CLINICAL DATA:  RIGHT ankle injury June 07, 2016. Continued pain. EXAM: RIGHT ANKLE - COMPLETE 3+ VIEW COMPARISON:  None available for comparison at time of study interpretation. FINDINGS: No fracture deformity nor dislocation. Tiny corticated bony fragment contiguous with the anterior superior talus. The ankle mortise appears congruent and the tibiofibular syndesmosis intact. No destructive bony lesions. Minimal lateral ankle soft tissue swelling without subcutaneous gas or radiopaque foreign bodies. IMPRESSION: Age indeterminate avulsion fracture anterior superior talus. Recommend correlation with point tenderness. No dislocation. Electronically Signed   By: Elon Alas M.D.   On: 06/17/2016 13:38    Assessment and Plan:  Closed nondisplaced avulsion fracture of right talus with routine healing, subsequent encounter  Complex regional pain syndrome type 1 of left lower extremity  Exam c/w healing avulsion from lateral talus. Doing well, keep 2 more weeks in CAM given RSD in L LE  Patient Instructions  Keep wearing CAM walker boot for 2 weeks, then wean out of boot over the next week as you are able.   Now, several times a day. Practice moving ankle up and down and side to side.  Also practice writing the letters of the alphabet with your toes.     Follow-up: Return in about 1 month (around 08/15/2016).  Medications Discontinued During This Encounter  Medication Reason  . gabapentin (NEURONTIN) 123XX123 MG capsule Duplicate    Signed,  Jerzie Bieri T. Ingeborg Fite, MD   Allergies as of 07/15/2016      Reactions   Allopurinol Other (See Comments)   GI upset   Lisinopril-hydrochlorothiazide    Burning in feet.   Procardia [nifedipine] Other (See Comments)   Bradycardia, leg swelling   Amlodipine Rash   BLE      Medication List       Accurate as of 07/15/16 10:19 AM. Always use your most recent med list.           chlorthalidone 25 MG tablet Commonly known as:  HYGROTON Take 1 tablet (25 mg total) by mouth daily.   colchicine 0.6 MG tablet TAKE ONE TABLET BY MOUTH TWICE DAILY AS NEEDED FOR GOUT   gabapentin 100 MG capsule Commonly known as:  NEURONTIN Take 2 capsules (200 mg total) by mouth 2 (two) times daily.   HYDROcodone-acetaminophen 10-325 MG tablet Commonly known as:  NORCO Take 1 tablet by mouth every 6 (six) hours as needed.   lisinopril 20 MG tablet Commonly known as:  PRINIVIL,ZESTRIL Take 1 tablet (20 mg total) by mouth 2 (two) times daily.   nitroGLYCERIN 0.4 MG SL tablet Commonly known as:  NITROSTAT Place 1 tablet (0.4 mg total) under the tongue every 5 (five) minutes as needed for chest pain.   pravastatin 40 MG tablet Commonly known as:  PRAVACHOL TAKE ONE TABLET BY MOUTH AT BEDTIME

## 2016-07-15 NOTE — Patient Instructions (Signed)
Keep wearing CAM walker boot for 2 weeks, then wean out of boot over the next week as you are able.   Now, several times a day. Practice moving ankle up and down and side to side.  Also practice writing the letters of the alphabet with your toes.

## 2016-07-30 ENCOUNTER — Other Ambulatory Visit: Payer: Self-pay

## 2016-07-30 MED ORDER — GABAPENTIN 100 MG PO CAPS: 300.0000 mg | ORAL_CAPSULE | Freq: Three times a day (TID) | ORAL | 1 refills | Status: DC

## 2016-07-30 NOTE — Telephone Encounter (Signed)
Back in august he was having dizziness which I thought was related to gabapentin he was taking and I had asked him to try and decrease dose for 1 week trial to see if dizziness improved. I will send in higher dose but caution with dizziness recurrence.

## 2016-07-30 NOTE — Telephone Encounter (Signed)
Message left advising patient.  

## 2016-07-30 NOTE — Telephone Encounter (Signed)
Pt called wanting a refill on Gabapentin he said he used to get it for 3 tabs po TID and he doesn't know why it was changed to 2 tabs po BID. He said he still taking 7-8 tabs a day, he's almost out and he won't be able to get a refill until after 08/12/16.  Last refill 06/05/16 #360 +3.

## 2016-08-05 ENCOUNTER — Other Ambulatory Visit: Payer: Self-pay | Admitting: Family Medicine

## 2016-08-05 NOTE — Telephone Encounter (Signed)
Ok to refill? Last filled 07/03/16 #100 0RF

## 2016-08-06 MED ORDER — HYDROCODONE-ACETAMINOPHEN 10-325 MG PO TABS: 1.0000 | ORAL_TABLET | Freq: Four times a day (QID) | ORAL | 0 refills | Status: DC | PRN

## 2016-08-06 NOTE — Telephone Encounter (Signed)
Printed and in Kim's box 

## 2016-08-06 NOTE — Telephone Encounter (Signed)
Message left advising patient and Rx placed up front for pick up. 

## 2016-08-07 ENCOUNTER — Encounter: Payer: Self-pay | Admitting: Family Medicine

## 2016-08-17 ENCOUNTER — Telehealth: Payer: Self-pay | Admitting: *Deleted

## 2016-08-17 NOTE — Telephone Encounter (Signed)
PA for gabapentin in your IN box for completion.

## 2016-08-18 NOTE — Telephone Encounter (Signed)
PA faxed

## 2016-08-18 NOTE — Telephone Encounter (Signed)
Filled and in Kim's box. 

## 2016-08-19 NOTE — Telephone Encounter (Signed)
PA approved.

## 2016-09-01 ENCOUNTER — Other Ambulatory Visit: Payer: Self-pay | Admitting: Family Medicine

## 2016-09-01 NOTE — Telephone Encounter (Signed)
Ok to refill? Last filled 08/06/16 #100 0RF. See previous Mychart message about quantity.

## 2016-09-02 MED ORDER — HYDROCODONE-ACETAMINOPHEN 10-325 MG PO TABS: 1.0000 | ORAL_TABLET | Freq: Four times a day (QID) | ORAL | 0 refills | Status: DC | PRN

## 2016-09-02 NOTE — Telephone Encounter (Signed)
Printed and in Kim's box 

## 2016-09-02 NOTE — Telephone Encounter (Signed)
LMOM for patient to pick up Rx at front office

## 2016-09-29 ENCOUNTER — Encounter: Payer: Self-pay | Admitting: Family Medicine

## 2016-09-29 ENCOUNTER — Other Ambulatory Visit: Payer: Self-pay | Admitting: Family Medicine

## 2016-09-29 MED ORDER — HYDROCODONE-ACETAMINOPHEN 10-325 MG PO TABS: 1.0000 | ORAL_TABLET | Freq: Four times a day (QID) | ORAL | 0 refills | Status: DC | PRN

## 2016-09-29 NOTE — Telephone Encounter (Signed)
Px printed for pick up in IN box  Refilled times one in pcp absence  Thanks

## 2016-09-29 NOTE — Telephone Encounter (Signed)
Ok to refill in Dr. [name] absence? Last filled 09/02/16 #100 0RF.

## 2016-09-29 NOTE — Telephone Encounter (Signed)
Pt notified Rx ready for pick up and it's time to update his UDS

## 2016-09-30 ENCOUNTER — Encounter: Payer: Self-pay | Admitting: Family Medicine

## 2016-09-30 DIAGNOSIS — Z79891 Long term (current) use of opiate analgesic: Secondary | ICD-10-CM | POA: Diagnosis not present

## 2016-10-07 ENCOUNTER — Other Ambulatory Visit: Payer: Self-pay | Admitting: Family Medicine

## 2016-10-11 ENCOUNTER — Encounter: Payer: Self-pay | Admitting: Family Medicine

## 2016-10-11 DIAGNOSIS — G8929 Other chronic pain: Secondary | ICD-10-CM | POA: Insufficient documentation

## 2016-10-12 ENCOUNTER — Other Ambulatory Visit: Payer: Self-pay | Admitting: Family Medicine

## 2016-10-12 ENCOUNTER — Encounter: Payer: Self-pay | Admitting: *Deleted

## 2016-10-26 ENCOUNTER — Other Ambulatory Visit: Payer: Self-pay | Admitting: Family Medicine

## 2016-10-26 NOTE — Telephone Encounter (Signed)
See mychart message, last filled on 09/29/16  #100

## 2016-10-27 MED ORDER — HYDROCODONE-ACETAMINOPHEN 10-325 MG PO TABS: 1.0000 | ORAL_TABLET | Freq: Four times a day (QID) | ORAL | 0 refills | Status: DC | PRN

## 2016-10-27 NOTE — Telephone Encounter (Signed)
Printed and in Kim's box 

## 2016-10-27 NOTE — Telephone Encounter (Signed)
Pt notified Rx ready for pickup 

## 2016-11-22 ENCOUNTER — Other Ambulatory Visit: Payer: Self-pay | Admitting: Family Medicine

## 2016-11-23 MED ORDER — HYDROCODONE-ACETAMINOPHEN 10-325 MG PO TABS
1.0000 | ORAL_TABLET | Freq: Four times a day (QID) | ORAL | 0 refills | Status: DC | PRN
Start: 2016-11-23 — End: 2016-12-18

## 2016-11-23 NOTE — Telephone Encounter (Signed)
Printed and in [employer]

## 2016-11-23 NOTE — Telephone Encounter (Signed)
Last Rx 10/27/2016. Last OV 06/2016. pls advise

## 2016-11-24 NOTE — Telephone Encounter (Signed)
Patient notified by telephone that script is up front ready for pickup. 

## 2016-11-27 ENCOUNTER — Other Ambulatory Visit: Payer: Self-pay | Admitting: Family Medicine

## 2016-12-10 ENCOUNTER — Telehealth: Payer: Self-pay | Admitting: Family Medicine

## 2016-12-10 ENCOUNTER — Other Ambulatory Visit: Payer: Self-pay | Admitting: Family Medicine

## 2016-12-10 NOTE — Telephone Encounter (Signed)
Message sent to Gerre Pebbles asking her to call and schedule MWV with Sharrell Ku and CPE with [name].

## 2016-12-10 NOTE — Telephone Encounter (Signed)
Left pt message asking to call Allison back directly at [phone] to schedule AWV.+ labs with Lesia and CPE with PCP. °

## 2016-12-18 ENCOUNTER — Other Ambulatory Visit: Payer: Self-pay | Admitting: Family Medicine

## 2016-12-19 NOTE — Telephone Encounter (Signed)
Last office visit 07/15/16 with Dr. Lorelei Pont.  Last refilled 11/23/16 for #100 with no refills.  Ok to refill?

## 2016-12-22 MED ORDER — HYDROCODONE-ACETAMINOPHEN 10-325 MG PO TABS: 1.0000 | ORAL_TABLET | Freq: Four times a day (QID) | ORAL | 0 refills | Status: DC | PRN

## 2016-12-22 NOTE — Telephone Encounter (Signed)
Rx printed and in CMA box.  

## 2016-12-22 NOTE — Telephone Encounter (Signed)
Patient advised.  Rx left at front desk for pick up. 

## 2017-01-02 ENCOUNTER — Other Ambulatory Visit: Payer: Self-pay | Admitting: Family Medicine

## 2017-01-02 DIAGNOSIS — D696 Thrombocytopenia, unspecified: Secondary | ICD-10-CM

## 2017-01-02 DIAGNOSIS — M1A09X0 Idiopathic chronic gout, multiple sites, without tophus (tophi): Secondary | ICD-10-CM

## 2017-01-02 DIAGNOSIS — E785 Hyperlipidemia, unspecified: Secondary | ICD-10-CM

## 2017-01-02 DIAGNOSIS — Z1159 Encounter for screening for other viral diseases: Secondary | ICD-10-CM

## 2017-01-02 DIAGNOSIS — Z125 Encounter for screening for malignant neoplasm of prostate: Secondary | ICD-10-CM

## 2017-01-04 ENCOUNTER — Other Ambulatory Visit (INDEPENDENT_AMBULATORY_CARE_PROVIDER_SITE_OTHER): Payer: Medicare HMO

## 2017-01-04 ENCOUNTER — Other Ambulatory Visit: Payer: Self-pay | Admitting: Pediatrics

## 2017-01-04 DIAGNOSIS — D696 Thrombocytopenia, unspecified: Secondary | ICD-10-CM | POA: Diagnosis not present

## 2017-01-04 DIAGNOSIS — E785 Hyperlipidemia, unspecified: Secondary | ICD-10-CM | POA: Diagnosis not present

## 2017-01-04 DIAGNOSIS — Z1159 Encounter for screening for other viral diseases: Secondary | ICD-10-CM | POA: Diagnosis not present

## 2017-01-04 DIAGNOSIS — Z125 Encounter for screening for malignant neoplasm of prostate: Secondary | ICD-10-CM

## 2017-01-04 DIAGNOSIS — M1A09X0 Idiopathic chronic gout, multiple sites, without tophus (tophi): Secondary | ICD-10-CM

## 2017-01-04 LAB — CBC WITH DIFFERENTIAL/PLATELET
BASOS PCT: 1 % (ref 0.0–3.0)
Basophils Absolute: 0.1 10*3/uL (ref 0.0–0.1)
EOS ABS: 0.2 10*3/uL (ref 0.0–0.7)
Eosinophils Relative: 3 % (ref 0.0–5.0)
HCT: 41.4 % (ref 39.0–52.0)
Hemoglobin: 14.3 g/dL (ref 13.0–17.0)
LYMPHS ABS: 2.3 10*3/uL (ref 0.7–4.0)
Lymphocytes Relative: 34.2 % (ref 12.0–46.0)
MCHC: 34.6 g/dL (ref 30.0–36.0)
MCV: 89.3 fl (ref 78.0–100.0)
MONO ABS: 0.6 10*3/uL (ref 0.1–1.0)
Monocytes Relative: 9 % (ref 3.0–12.0)
NEUTROS ABS: 3.6 10*3/uL (ref 1.4–7.7)
Neutrophils Relative %: 52.8 % (ref 43.0–77.0)
PLATELETS: 151 10*3/uL (ref 150.0–400.0)
RBC: 4.63 Mil/uL (ref 4.22–5.81)
RDW: 14.2 % (ref 11.5–15.5)
WBC: 6.8 10*3/uL (ref 4.0–10.5)

## 2017-01-04 LAB — LIPID PANEL
Cholesterol: 198 mg/dL (ref 0–200)
HDL: 51.7 mg/dL (ref >39.00)
NONHDL: 145.82
Total CHOL/HDL Ratio: 4
Triglycerides: 334 mg/dL — ABNORMAL HIGH (ref 0.0–149.0)
VLDL: 66.8 mg/dL — ABNORMAL HIGH (ref 0.0–40.0)

## 2017-01-04 LAB — COMPREHENSIVE METABOLIC PANEL
ALT: 26 U/L (ref 0–53)
AST: 23 U/L (ref 0–37)
Albumin: 4.3 g/dL (ref 3.5–5.2)
Alkaline Phosphatase: 55 U/L (ref 39–117)
BILIRUBIN TOTAL: 1.3 mg/dL — AB (ref 0.2–1.2)
BUN: 34 mg/dL — AB (ref 6–23)
CO2: 29 mEq/L (ref 19–32)
CREATININE: 1.3 mg/dL (ref 0.40–1.50)
Calcium: 9.4 mg/dL (ref 8.4–10.5)
Chloride: 103 mEq/L (ref 96–112)
GFR: 58.69 mL/min — ABNORMAL LOW (ref >60.00)
GLUCOSE: 115 mg/dL — AB (ref 70–99)
Potassium: 4.3 mEq/L (ref 3.5–5.1)
Sodium: 138 mEq/L (ref 135–145)
Total Protein: 6.8 g/dL (ref 6.0–8.3)

## 2017-01-04 LAB — PSA, MEDICARE: PSA: 0.39 ng/mL (ref 0.10–4.00)

## 2017-01-04 LAB — LDL CHOLESTEROL, DIRECT: Direct LDL: 99 mg/dL

## 2017-01-04 LAB — URIC ACID: Uric Acid, Serum: 10.6 mg/dL — ABNORMAL HIGH (ref 4.0–7.8)

## 2017-01-04 NOTE — Telephone Encounter (Signed)
Scheduled 6/14 for cpe with pcp

## 2017-01-04 NOTE — Telephone Encounter (Signed)
Pharmacy requesting 90 day supply.  Pt has CPE sch 01/07/17.

## 2017-01-05 LAB — HEPATITIS C ANTIBODY: HCV Ab: NEGATIVE

## 2017-01-05 MED ORDER — LISINOPRIL 20 MG PO TABS: 20.0000 mg | ORAL_TABLET | Freq: Two times a day (BID) | ORAL | 1 refills | Status: DC

## 2017-01-07 ENCOUNTER — Ambulatory Visit (INDEPENDENT_AMBULATORY_CARE_PROVIDER_SITE_OTHER): Payer: Medicare HMO | Admitting: Family Medicine

## 2017-01-07 ENCOUNTER — Other Ambulatory Visit: Payer: Self-pay | Admitting: Family Medicine

## 2017-01-07 ENCOUNTER — Encounter: Payer: Self-pay | Admitting: Family Medicine

## 2017-01-07 VITALS — BP 128/72 | HR 46 | Temp 98.1°F | Ht 66.5 in | Wt 220.8 lb

## 2017-01-07 DIAGNOSIS — G8929 Other chronic pain: Secondary | ICD-10-CM

## 2017-01-07 DIAGNOSIS — M719 Bursopathy, unspecified: Secondary | ICD-10-CM | POA: Insufficient documentation

## 2017-01-07 DIAGNOSIS — Z7189 Other specified counseling: Secondary | ICD-10-CM

## 2017-01-07 DIAGNOSIS — D696 Thrombocytopenia, unspecified: Secondary | ICD-10-CM | POA: Diagnosis not present

## 2017-01-07 DIAGNOSIS — Z1211 Encounter for screening for malignant neoplasm of colon: Secondary | ICD-10-CM

## 2017-01-07 DIAGNOSIS — E785 Hyperlipidemia, unspecified: Secondary | ICD-10-CM

## 2017-01-07 DIAGNOSIS — Z0001 Encounter for general adult medical examination with abnormal findings: Secondary | ICD-10-CM

## 2017-01-07 DIAGNOSIS — G90522 Complex regional pain syndrome I of left lower limb: Secondary | ICD-10-CM

## 2017-01-07 DIAGNOSIS — M1A09X0 Idiopathic chronic gout, multiple sites, without tophus (tophi): Secondary | ICD-10-CM | POA: Diagnosis not present

## 2017-01-07 DIAGNOSIS — Z0000 Encounter for general adult medical examination without abnormal findings: Secondary | ICD-10-CM

## 2017-01-07 DIAGNOSIS — Z23 Encounter for immunization: Secondary | ICD-10-CM | POA: Diagnosis not present

## 2017-01-07 DIAGNOSIS — E663 Overweight: Secondary | ICD-10-CM | POA: Insufficient documentation

## 2017-01-07 DIAGNOSIS — E66811 Obesity, class 1: Secondary | ICD-10-CM | POA: Insufficient documentation

## 2017-01-07 DIAGNOSIS — I10 Essential (primary) hypertension: Secondary | ICD-10-CM

## 2017-01-07 DIAGNOSIS — R001 Bradycardia, unspecified: Secondary | ICD-10-CM | POA: Diagnosis not present

## 2017-01-07 MED ORDER — GABAPENTIN 300 MG PO CAPS: 300.0000 mg | ORAL_CAPSULE | Freq: Three times a day (TID) | ORAL | 3 refills | Status: DC

## 2017-01-07 NOTE — Assessment & Plan Note (Signed)
Weight gain noted. Continue to encourage weight loss.

## 2017-01-07 NOTE — Assessment & Plan Note (Signed)
Chronic, stable. Continue current regimen. 

## 2017-01-07 NOTE — Assessment & Plan Note (Signed)
Pulse Readings from Last 3 Encounters:  01/07/17 (!) 46  07/15/16 (!) 52  06/17/16 (!) 53  chronic, stable, asxs. Continue to monitor. Consider updated EKG next visit.

## 2017-01-07 NOTE — Assessment & Plan Note (Signed)

## 2017-01-07 NOTE — Assessment & Plan Note (Signed)
Chronic, ongoing pain with neuropathy. Managed with gabapentin 300mg  TID, hydrocodone 10/325mg  QID PRN #100 per month. Continue.

## 2017-01-07 NOTE — Assessment & Plan Note (Addendum)
Chronic, uncontrolled with urate 10.6 and frequent flares despite colchicine daily. Discussed goal. H/o allopurinol intolerance. Previously tolerated uloric well, unclear why stopped. I wonder if gout contributing to recent bursitis - discussed with patient.  rec start vit C 500mg  daily, discussed tart cherry juice.

## 2017-01-07 NOTE — Assessment & Plan Note (Addendum)
Stable on current pain regimen of gabapentin 300mg  tid and hydrocodone 10/325mg  #100/month.  [place] CSRS reviewed.

## 2017-01-07 NOTE — Assessment & Plan Note (Signed)
Anticipate left shoulder and olecranon bursitis, ?gout related. Discussed starting uloric.

## 2017-01-07 NOTE — Telephone Encounter (Signed)
Electronic refill request Uloric. Last office visit:   Today Medication is not listed on patient's current meds list.  Please advise.

## 2017-01-07 NOTE — Assessment & Plan Note (Signed)
Stable period.  

## 2017-01-07 NOTE — Assessment & Plan Note (Signed)
Advanced directive discussion - has living will at home. Wife is [employer]CP[place]. Asked to bring copy 

## 2017-01-07 NOTE — Assessment & Plan Note (Signed)
Chronic, triglyceride levels significantly elevated but LDL remains <100. Discussed healthy diet changes to improve lipid control - consider fibrate if no better next blood work.

## 2017-01-07 NOTE — Patient Instructions (Addendum)
We will refer you for colonoscopy sometime later this year - expect a call.  prevnar (pneumonia shot) today Bring [place] copy of your advanced directive to update chart.  You are doing well today. Return as needed or in 1 year for next wellness visit with Katha Cabal and physical with me.   Health Maintenance, Male A healthy lifestyle and preventive care is important for your health and wellness. Ask your health care provider about what schedule of regular examinations is right for you. What should I know about weight and diet? Eat a Healthy Diet  Eat plenty of vegetables, fruits, whole grains, low-fat dairy products, and lean protein.  Do not eat a lot of foods high in solid fats, added sugars, or salt.  Maintain a Healthy Weight Regular exercise can help you achieve or maintain a healthy weight. You should:  Do at least 150 minutes of exercise each week. The exercise should increase your heart rate and make you sweat (moderate-intensity exercise).  Do strength-training exercises at least twice a week.  Watch Your Levels of Cholesterol and Blood Lipids  Have your blood tested for lipids and cholesterol every 5 years starting at 65 years of age. If you are at high risk for heart disease, you should start having your blood tested when you are 66 years old. You may need to have your cholesterol levels checked more often if: ? Your lipid or cholesterol levels are high. ? You are older than 66 years of age. ? You are at high risk for heart disease.  What should I know about cancer screening? Many types of cancers can be detected early and may often be prevented. Lung Cancer  You should be screened every year for lung cancer if: ? You are a current smoker who has smoked for at least 30 years. ? You are a former smoker who has quit within the past 15 years.  Talk to your health care provider about your screening options, when you should start screening, and how often you should be  screened.  Colorectal Cancer  Routine colorectal cancer screening usually begins at 66 years of age and should be repeated every 5-10 years until you are 66 years old. You may need to be screened more often if early forms of precancerous polyps or small growths are found. Your health care provider may recommend screening at an earlier age if you have risk factors for colon cancer.  Your health care provider may recommend using home test kits to check for hidden blood in the stool.  A small camera at the end of a tube can be used to examine your colon (sigmoidoscopy or colonoscopy). This checks for the earliest forms of colorectal cancer.  Prostate and Testicular Cancer  Depending on your age and overall health, your health care provider may do certain tests to screen for prostate and testicular cancer.  Talk to your health care provider about any symptoms or concerns you have about testicular or prostate cancer.  Skin Cancer  Check your skin from head to toe regularly.  Tell your health care provider about any new moles or changes in moles, especially if: ? There is a change in a mole's size, shape, or color. ? You have a mole that is larger than a pencil eraser.  Always use sunscreen. Apply sunscreen liberally and repeat throughout the day.  Protect yourself by wearing long sleeves, pants, a wide-brimmed hat, and sunglasses when outside.  What should I know about heart disease, diabetes, and  high blood pressure?  If you are 72-64 years of age, have your blood pressure checked every 3-5 years. If you are 57 years of age or older, have your blood pressure checked every year. You should have your blood pressure measured twice-once when you are at a hospital or clinic, and once when you are not at a hospital or clinic. Record the average of the two measurements. To check your blood pressure when you are not at a hospital or clinic, you can use: ? An automated blood pressure machine at a  pharmacy. ? A home blood pressure monitor.  Talk to your health care provider about your target blood pressure.  If you are between 69-27 years old, ask your health care provider if you should take aspirin to prevent heart disease.  Have regular diabetes screenings by checking your fasting blood sugar level. ? If you are at a normal weight and have a low risk for diabetes, have this test once every three years after the age of 51. ? If you are overweight and have a high risk for diabetes, consider being tested at a younger age or more often.  A one-time screening for abdominal aortic aneurysm (AAA) by ultrasound is recommended for men aged 10-75 years who are current or former smokers. What should I know about preventing infection? Hepatitis B If you have a higher risk for hepatitis B, you should be screened for this virus. Talk with your health care provider to find out if you are at risk for hepatitis B infection. Hepatitis C Blood testing is recommended for:  Everyone born from [birth year] through 1965.  Anyone with known risk factors for hepatitis C.  Sexually Transmitted Diseases (STDs)  You should be screened each year for STDs including gonorrhea and chlamydia if: ? You are sexually active and are younger than 66 years of age. ? You are older than 66 years of age and your health care provider tells you that you are at risk for this type of infection. ? Your sexual activity has changed since you were last screened and you are at an increased risk for chlamydia or gonorrhea. Ask your health care provider if you are at risk.  Talk with your health care provider about whether you are at high risk of being infected with HIV. Your health care provider may recommend a prescription medicine to help prevent HIV infection.  What else can I do?  Schedule regular health, dental, and eye exams.  Stay current with your vaccines (immunizations).  Do not use any tobacco products, such as  cigarettes, chewing tobacco, and e-cigarettes. If you need help quitting, ask your health care provider.  Limit alcohol intake to no more than 2 drinks per day. One drink equals 12 ounces of beer, 5 ounces of wine, or 1 ounces of hard liquor.  Do not use street drugs.  Do not share needles.  Ask your health care provider for help if you need support or information about quitting drugs.  Tell your health care provider if you often feel depressed.  Tell your health care provider if you have ever been abused or do not feel safe at home. This information is not intended to replace advice given to you by your health care provider. Make sure you discuss any questions you have with your health care provider. Document Released: 01/09/2008 Document Revised: 03/11/2016 Document Reviewed: 04/16/2015 Elsevier Interactive Patient Education  [employer].

## 2017-01-07 NOTE — Progress Notes (Signed)
BP 128/72 (BP Location: Left Arm, Patient Position: Sitting, Cuff Size: Large)   Pulse (!) 46   Temp 98.1 F (36.7 C) (Oral)   Ht 5' 6.5" (1.689 m)   Wt 220 lb 12 oz (100.1 kg)   SpO2 97%   BMI 35.10 kg/m    CC: AMW/CPE Subjective:    Patient ID: Randy Kennedy, male    DOB: [birth year], 66 y.o.   MRN: [id number]  HPI: Randy Kennedy is a 66 y.o. male presenting on 01/07/2017 for Medicare Wellness   06/2016 - suffered close nondisplaced avulsion fracture of talus. Healed well from this.   Longstanding periph neuropathy - unsure cause. Has had 3 EMGs in the past - saw neurologist in [place] regularly. No h/o DM, no h/o EtOH use. Known RLS. Gabapentin helps but causes over sedation. Current pain regimen is hydrocodone 10/325mg  #100/month  L shoulder pain and elbow pain over last 6 weeks without inciting trauma.   Gout - intolerant to allopurinol - GI upset. Takes colchicine 0.6mg  once daily, extra with flares.   Hearing screen - hearing aides Vision screen - eye exam yearly at eye clinic Fall risk screen - failed - 1 fall with ankle fracture. [place] while chasing grandchildren.  Depression screen - passed  Preventative: COLONOSCOPY Date: 02/2011 ext hem, benign polyp, rpt 5 yrs ([place]) Prostate cancer screening - rec screening given fmhx.  Lung cancer screening - doesn't qualify Flu shot yearly Tetanus shot - 2014 Prevnar - today Shingles shot - 04/2013  Shingrix - discussed Advanced directive discussion - has living will at home. Wife is Occupational psychologist. Asked to bring copy Seat belt use discussed Sunscreen use and skin screen discussed. No changing moles on skin. Ex smoker quit 1980 Alcohol - rare  Caffeine: 2 cans coke/day Lives with wife and grown son  Occupation: retired, was Agricultural engineer.  On disability for chronic pain Edu: MBA Activity: no regular exercise Deit: good water, fruits/vegetables daily  Relevant past medical, surgical, family and social  history reviewed and updated as indicated. Interim medical history since our last visit reviewed. Allergies and medications reviewed and updated. Outpatient Medications Prior to Visit  Medication Sig Dispense Refill  . chlorthalidone (HYGROTON) 25 MG tablet TAKE ONE TABLET BY MOUTH ONCE DAILY 90 tablet 3  . colchicine 0.6 MG tablet TAKE ONE TABLET BY MOUTH TWICE DAILY AS NEEDED FOR GOUT 180 tablet 3  . HYDROcodone-acetaminophen (NORCO) 10-325 MG tablet Take 1 tablet by mouth every 6 (six) hours as needed. 100 tablet 0  . lisinopril (PRINIVIL,ZESTRIL) 20 MG tablet Take 1 tablet (20 mg total) by mouth 2 (two) times daily. 180 tablet 1  . nitroGLYCERIN (NITROSTAT) 0.4 MG SL tablet Place 1 tablet (0.4 mg total) under the tongue every 5 (five) minutes as needed for chest pain. 25 tablet 3  . pravastatin (PRAVACHOL) 40 MG tablet TAKE ONE TABLET BY MOUTH AT BEDTIME 90 tablet 0  . gabapentin (NEURONTIN) 100 MG capsule Take 3 capsules (300 mg total) by mouth 3 (three) times daily. 800 capsule 1   No facility-administered medications prior to visit.      Per HPI unless specifically indicated in ROS section below Review of Systems  Constitutional: Negative for activity change, appetite change, chills, fatigue, fever and unexpected weight change.  HENT: Negative for hearing loss.   Eyes: Negative for visual disturbance.  Respiratory: Positive for cough. Negative for chest tightness, shortness of breath and wheezing.   Cardiovascular: Positive for leg swelling (chronic on  left). Negative for chest pain and palpitations.  Gastrointestinal: Positive for diarrhea (colchicine). Negative for abdominal distention, abdominal pain, blood in stool, constipation, nausea and vomiting.  Genitourinary: Negative for difficulty urinating and hematuria.  Musculoskeletal: Negative for arthralgias, myalgias and neck pain.       Hand swelling  Skin: Negative for rash.  Neurological: Positive for headaches (allergies).  Negative for dizziness, seizures and syncope.  Hematological: Negative for adenopathy. Does not bruise/bleed easily.  Psychiatric/Behavioral: Negative for dysphoric mood. The patient is not nervous/anxious.        Objective:    BP 128/72 (BP Location: Left Arm, Patient Position: Sitting, Cuff Size: Large)   Pulse (!) 46   Temp 98.1 F (36.7 C) (Oral)   Ht 5' 6.5" (1.689 m)   Wt 220 lb 12 oz (100.1 kg)   SpO2 97%   BMI 35.10 kg/m   Wt Readings from Last 3 Encounters:  01/07/17 220 lb 12 oz (100.1 kg)  07/15/16 215 lb 8 oz (97.8 kg)  06/17/16 195 lb (88.5 kg)    Physical Exam  Constitutional: He is oriented to person, place, and time. He appears well-developed and well-nourished. No distress.  HENT:  Head: Normocephalic and atraumatic.  Right Ear: Hearing, tympanic membrane, external ear and ear canal normal.  Left Ear: Hearing, tympanic membrane, external ear and ear canal normal.  Nose: Nose normal.  Mouth/Throat: Uvula is midline, oropharynx is clear and moist and mucous membranes are normal. No oropharyngeal exudate, posterior oropharyngeal edema or posterior oropharyngeal erythema.  Eyes: Conjunctivae and EOM are normal. Pupils are equal, round, and reactive to light. No scleral icterus.  Neck: Normal range of motion. Neck supple. Carotid bruit is not present. No thyromegaly present.  Cardiovascular: Normal rate, regular rhythm, normal heart sounds and intact distal pulses.   No murmur heard. Pulses:      Radial pulses are 2+ on the right side, and 2+ on the left side.  Pulmonary/Chest: Effort normal and breath sounds normal. No respiratory distress. He has no wheezes. He has no rales.  Abdominal: Soft. Bowel sounds are normal. He exhibits no distension and no mass. There is no tenderness. There is no rebound and no guarding.  Genitourinary: Prostate normal. Rectal exam shows external hemorrhoid (deflated). Rectal exam shows no internal hemorrhoid, no fissure, no mass, no  tenderness and anal tone normal. Prostate is not enlarged (20gm) and not tender.  Musculoskeletal: Normal range of motion. He exhibits no edema.  L olecranon bursitis without erythema L posterior shoulder pain to palpation  Lymphadenopathy:    He has no cervical adenopathy.  Neurological: He is alert and oriented to person, place, and time.  CN grossly intact, station and gait intact Recall 1/3, 2/3 with cue Calculation 4/5 serial sevens  Skin: Skin is warm and dry. No rash noted.  Psychiatric: He has a normal mood and affect. His behavior is normal. Judgment and thought content normal.  Nursing note and vitals reviewed.  Results for orders placed or performed in visit on 01/04/17  Lipid panel  Result Value Ref Range   Cholesterol 198 0 - 200 mg/dL   Triglycerides 334.0 (H) 0.0 - 149.0 mg/dL   HDL 51.70 >39.00 mg/dL   VLDL 66.8 (H) 0.0 - 40.0 mg/dL   Total CHOL/HDL Ratio 4    NonHDL 145.82   Comprehensive metabolic panel  Result Value Ref Range   Sodium 138 135 - 145 mEq/L   Potassium 4.3 3.5 - 5.1 mEq/L   Chloride 103  96 - 112 mEq/L   CO2 29 19 - 32 mEq/L   Glucose, Bld 115 (H) 70 - 99 mg/dL   BUN 34 (H) 6 - 23 mg/dL   Creatinine, Ser 1.30 0.40 - 1.50 mg/dL   Total Bilirubin 1.3 (H) 0.2 - 1.2 mg/dL   Alkaline Phosphatase 55 39 - 117 U/L   AST 23 0 - 37 U/L   ALT 26 0 - 53 U/L   Total Protein 6.8 6.0 - 8.3 g/dL   Albumin 4.3 3.5 - 5.2 g/dL   Calcium 9.4 8.4 - 10.5 mg/dL   GFR 58.69 (L) >60.00 mL/min  Uric acid  Result Value Ref Range   Uric Acid, Serum 10.6 (H) 4.0 - 7.8 mg/dL  CBC with Differential/Platelet  Result Value Ref Range   WBC 6.8 4.0 - 10.5 K/uL   RBC 4.63 4.22 - 5.81 Mil/uL   Hemoglobin 14.3 13.0 - 17.0 g/dL   HCT 41.4 39.0 - 52.0 %   MCV 89.3 78.0 - 100.0 fl   MCHC 34.6 30.0 - 36.0 g/dL   RDW 14.2 11.5 - 15.5 %   Platelets 151.0 150.0 - 400.0 K/uL   Neutrophils Relative % 52.8 43.0 - 77.0 %   Lymphocytes Relative 34.2 12.0 - 46.0 %   Monocytes  Relative 9.0 3.0 - 12.0 %   Eosinophils Relative 3.0 0.0 - 5.0 %   Basophils Relative 1.0 0.0 - 3.0 %   Neutro Abs 3.6 1.4 - 7.7 K/uL   Lymphs Abs 2.3 0.7 - 4.0 K/uL   Monocytes Absolute 0.6 0.1 - 1.0 K/uL   Eosinophils Absolute 0.2 0.0 - 0.7 K/uL   Basophils Absolute 0.1 0.0 - 0.1 K/uL  PSA, Medicare  Result Value Ref Range   PSA 0.39 0.10 - 4.00 ng/ml  Hepatitis C antibody  Result Value Ref Range   HCV Ab NEGATIVE NEGATIVE  LDL cholesterol, direct  Result Value Ref Range   Direct LDL 99.0 mg/dL   Hearing Screening Comments: Pt wears hearing aids Vision Screening Comments: Pt had eye exam in Jan 2018 at [place]:   Problem List Items Addressed This Visit    Advanced care planning/counseling discussion    Advanced directive discussion - has living will at home. Wife is Occupational psychologist. Asked to bring copy      Bradycardia    Pulse Readings from Last 3 Encounters:  01/07/17 (!) 46  07/15/16 (!) 52  06/17/16 (!) 53  chronic, stable, asxs. Continue to monitor. Consider updated EKG next visit.       Bursitis disorder    Anticipate left shoulder and olecranon bursitis, ?gout related. Discussed starting uloric.       Encounter for chronic pain management    Stable on current pain regimen of gabapentin 300mg  tid and hydrocodone 10/325mg  #100/month.  [place] CSRS reviewed.      Gout    Chronic, uncontrolled with urate 10.6 and frequent flares despite colchicine daily. Discussed goal. H/o allopurinol intolerance. Previously tolerated uloric well, unclear why stopped. I wonder if gout contributing to recent bursitis - discussed with patient.  rec start vit C 500mg  daily, discussed tart cherry juice.       Health maintenance examination    Preventative protocols reviewed and updated unless pt declined. Discussed healthy diet and lifestyle.       HLD (hyperlipidemia)    Chronic, triglyceride levels significantly elevated but LDL remains <100.  Discussed healthy diet changes to  improve lipid control - consider fibrate if no better next blood work.       HTN (hypertension)    Chronic, stable. Continue current regimen.       Medicare annual wellness visit, subsequent - Primary    I have personally reviewed the Medicare Annual Wellness questionnaire and have noted 1. The patient's medical and social history 2. Their use of alcohol, tobacco or illicit drugs 3. Their current medications and supplements 4. The patient's functional ability including ADL's, fall risks, home safety risks and hearing or visual impairment. Cognitive function has been assessed and addressed as indicated.  5. Diet and physical activity 6. Evidence for depression or mood disorders The patients weight, height, BMI have been recorded in the chart. I have made referrals, counseling and provided education to the patient based on review of the above and I have provided the pt with a written personalized care plan for preventive services. Provider list updated.. See scanned questionairre as needed for further documentation. Reviewed preventative protocols and updated unless pt declined.       RSD lower limb    Chronic, ongoing pain with neuropathy. Managed with gabapentin 300mg  TID, hydrocodone 10/325mg  QID PRN #100 per month. Continue.       Relevant Medications   gabapentin (NEURONTIN) 300 MG capsule   Severe obesity (BMI 35.0-39.9) (HCC)    Weight gain noted. Continue to encourage weight loss.       Thrombocytopenia (HCC)    Stable period.       Other Visit Diagnoses    Special screening for malignant neoplasms, colon       Relevant Orders   Ambulatory referral to Gastroenterology   Need for vaccination with 13-polyvalent pneumococcal conjugate vaccine       Relevant Orders   Pneumococcal conjugate vaccine 13-valent (Completed)       Follow up plan: Return in about 1 year (around 01/07/2018) for annual exam, prior fasting for blood work,  medicare wellness visit.  [name], MD

## 2017-01-07 NOTE — Assessment & Plan Note (Signed)
Preventative protocols reviewed and updated unless pt declined. Discussed healthy diet and lifestyle.  

## 2017-01-11 NOTE — Telephone Encounter (Signed)
Sent in

## 2017-01-12 ENCOUNTER — Telehealth: Payer: Self-pay | Admitting: Family Medicine

## 2017-01-12 NOTE — Telephone Encounter (Signed)
Pt was calling on the status of his prescription for gout.  He said it was to be called into the [facility] on [address].  The rx is call [facility]

## 2017-01-12 NOTE — Telephone Encounter (Signed)
plz have him check with pharmacy - it was sent in Monday afternoon.

## 2017-01-13 ENCOUNTER — Telehealth: Payer: Self-pay | Admitting: *Deleted

## 2017-01-13 NOTE — Telephone Encounter (Signed)
Spoke with patient he stated he hadn't checked to see if refill was ready. Informed that it was sent on Monday afternoon.

## 2017-01-18 ENCOUNTER — Other Ambulatory Visit: Payer: Self-pay | Admitting: Family Medicine

## 2017-01-18 MED ORDER — HYDROCODONE-ACETAMINOPHEN 10-325 MG PO TABS: 1.0000 | ORAL_TABLET | Freq: Four times a day (QID) | ORAL | 0 refills | Status: DC | PRN

## 2017-01-18 NOTE — Telephone Encounter (Signed)
Spoke to pt and informed him Rx is available for pickup from the front desk 

## 2017-01-18 NOTE — Telephone Encounter (Signed)
Printed and in CMA box 

## 2017-01-18 NOTE — Telephone Encounter (Signed)
Last Rx 05/29. Last OV 12/2016

## 2017-01-18 NOTE — Telephone Encounter (Signed)
Pt states [facility] cannot fill Ulor[facility]. Says ins will pay for 30 but not 90.

## 2017-01-20 ENCOUNTER — Other Ambulatory Visit: Payer: Self-pay | Admitting: Family Medicine

## 2017-01-20 MED ORDER — FEBUXOSTAT 40 MG PO TABS: 40.0000 mg | ORAL_TABLET | Freq: Every day | ORAL | 5 refills | Status: DC

## 2017-01-20 NOTE — Telephone Encounter (Signed)
New 30D rx sent with 5R

## 2017-01-25 ENCOUNTER — Encounter: Payer: Self-pay | Admitting: Family Medicine

## 2017-01-26 MED ORDER — PREDNISONE 20 MG PO TABS: ORAL_TABLET | ORAL | 0 refills | Status: DC

## 2017-01-26 NOTE — Telephone Encounter (Signed)
See 2nd part of pt email

## 2017-02-01 ENCOUNTER — Telehealth: Payer: Self-pay

## 2017-02-01 NOTE — Telephone Encounter (Signed)
Pt left v/m;[facility] had requested refill x 3 on gabapentin and [facility] had not responded. I spoke with Jasmine at [address] and they do have the gabapentin 300 mg that was sent 01/07/17. Jasmine wants to know if she should fill rx; advised yes. Left detailed v/m per DPR for pt to ck with Jasmine at [address].

## 2017-02-13 ENCOUNTER — Other Ambulatory Visit: Payer: Self-pay | Admitting: Family Medicine

## 2017-02-15 NOTE — Telephone Encounter (Signed)
This can wait for Dr [name] to return tomorrow. It is not due to be filled until at least the 27 or 28 because the last rx said to fill on or after 6/28.  Last OV/CPE 01-07-17 Last UDS 09-29-16

## 2017-02-16 MED ORDER — HYDROCODONE-ACETAMINOPHEN 10-325 MG PO TABS: 1.0000 | ORAL_TABLET | Freq: Four times a day (QID) | ORAL | 0 refills | Status: DC | PRN

## 2017-02-16 NOTE — Telephone Encounter (Signed)
Patient notified by telephone that script is up front ready for pickup. 

## 2017-02-16 NOTE — Telephone Encounter (Signed)
Printed and in CMA box 

## 2017-03-01 ENCOUNTER — Other Ambulatory Visit: Payer: Self-pay | Admitting: Family Medicine

## 2017-03-12 ENCOUNTER — Other Ambulatory Visit: Payer: Self-pay | Admitting: Family Medicine

## 2017-03-12 NOTE — Telephone Encounter (Signed)
St filled 02/16/17 for on or after 02/20/17... Please advise--- I have lready changed the note on current Rx to reflect 03/23/17 refill

## 2017-03-14 ENCOUNTER — Other Ambulatory Visit: Payer: Self-pay | Admitting: Family Medicine

## 2017-03-15 MED ORDER — HYDROCODONE-ACETAMINOPHEN 10-325 MG PO TABS: 1.0000 | ORAL_TABLET | Freq: Four times a day (QID) | ORAL | 0 refills | Status: DC | PRN

## 2017-03-15 NOTE — Telephone Encounter (Signed)
Pt previously on 10 day taper 01/2017. pls advise

## 2017-03-15 NOTE — Telephone Encounter (Signed)
Pt received message indicating Rx was available for pickup

## 2017-03-15 NOTE — Telephone Encounter (Signed)
Printed and in CMA box 

## 2017-03-16 ENCOUNTER — Encounter: Payer: Self-pay | Admitting: Family Medicine

## 2017-03-17 MED ORDER — FEBUXOSTAT 40 MG PO TABS: 40.0000 mg | ORAL_TABLET | Freq: Every day | ORAL | 5 refills | Status: DC

## 2017-03-17 NOTE — Telephone Encounter (Signed)
Received a PA request for Uloric. It states he has to have a documented trial of allopurinol before it will be covered. I did not see allopurinol in his history.

## 2017-03-18 NOTE — Telephone Encounter (Signed)
plz see allergy list - intolerant to allopurinol in the past. thanks

## 2017-03-26 ENCOUNTER — Other Ambulatory Visit: Payer: Self-pay | Admitting: Family Medicine

## 2017-03-26 MED ORDER — PREDNISONE 20 MG PO TABS: ORAL_TABLET | ORAL | 0 refills | Status: DC

## 2017-03-26 NOTE — Telephone Encounter (Signed)
Last Rx 01/26/17. Last OV 12/2016

## 2017-03-26 NOTE — Telephone Encounter (Signed)
Prior Auth started on CoverMyMeds for Uloric. It said it could take up to 5 business days. For a response.

## 2017-03-31 ENCOUNTER — Encounter: Payer: Self-pay | Admitting: Family Medicine

## 2017-03-31 ENCOUNTER — Ambulatory Visit (INDEPENDENT_AMBULATORY_CARE_PROVIDER_SITE_OTHER): Payer: Medicare HMO | Admitting: Family Medicine

## 2017-03-31 VITALS — BP 142/70 | HR 54 | Temp 98.4°F

## 2017-03-31 DIAGNOSIS — R69 Illness, unspecified: Secondary | ICD-10-CM | POA: Diagnosis not present

## 2017-03-31 DIAGNOSIS — I10 Essential (primary) hypertension: Secondary | ICD-10-CM

## 2017-03-31 DIAGNOSIS — G8929 Other chronic pain: Secondary | ICD-10-CM | POA: Diagnosis not present

## 2017-03-31 DIAGNOSIS — M7071 Other bursitis of hip, right hip: Secondary | ICD-10-CM | POA: Diagnosis not present

## 2017-03-31 DIAGNOSIS — F1120 Opioid dependence, uncomplicated: Secondary | ICD-10-CM

## 2017-03-31 DIAGNOSIS — R001 Bradycardia, unspecified: Secondary | ICD-10-CM

## 2017-03-31 DIAGNOSIS — M109 Gout, unspecified: Secondary | ICD-10-CM

## 2017-03-31 DIAGNOSIS — M1A09X0 Idiopathic chronic gout, multiple sites, without tophus (tophi): Secondary | ICD-10-CM

## 2017-03-31 MED ORDER — PREDNISONE 20 MG PO TABS: ORAL_TABLET | ORAL | 0 refills | Status: DC

## 2017-03-31 MED ORDER — HYDRALAZINE HCL 25 MG PO TABS: 25.0000 mg | ORAL_TABLET | Freq: Two times a day (BID) | ORAL | 3 refills | Status: DC

## 2017-03-31 NOTE — Assessment & Plan Note (Addendum)
Recurrent, persistent despite colchicine bid and short prednisone burst - will increase prednisone to prolonged stronger 60mg  taper starting today.  Will also stop chlorthalidone thiazide diuretic. Update if not improving with treatment.

## 2017-03-31 NOTE — Assessment & Plan Note (Signed)
This limits antihypertensive therapy.

## 2017-03-31 NOTE — Progress Notes (Signed)
BP (!) 142/70   Pulse (!) 54   Temp 98.4 F (36.9 C) (Oral)   SpO2 95%    CC: back pain Subjective:    Patient ID: Randy Kennedy, male    DOB: 01/13/1951, 66 y.o.   MRN: [id number]  HPI: Randy Kennedy is a 66 y.o. male presenting on 03/31/2017 for Back Pain (denies any urinary s/s)   Here with wife.  Ongoing R 1st MTPJ gout flare despite colchicine and starting prednisone 03/26/2017. Along with foot pain also having R sided lower back pain with radiation down right leg into calf. Describes burning pain with radiation down lateral leg into posterior knee and calf.   Denies inciting trauma/injury. Pain started after lifting bags of soil from [employer].   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   Chronic L leg pain - RSD s/p multiple surgeries after accident at work (fall down 3 stories).  Late last year he also suffered closed nondisplaced avulsion fracture of R talus with routine healing followed by Dr Lorelei Pont.   Relevant past medical, surgical, family and social history reviewed and updated as indicated. Interim medical history since our last visit reviewed. Allergies and medications reviewed and updated. Outpatient Medications Prior to Visit  Medication Sig Dispense Refill  . colchicine 0.6 MG tablet TAKE ONE TABLET BY MOUTH TWICE DAILY AS NEEDED FOR GOUT 180 tablet 3  . gabapentin (NEURONTIN) 300 MG capsule Take 1 capsule (300 mg total) by mouth 3 (three) times daily. 270 capsule 3  . HYDROcodone-acetaminophen (NORCO) 10-325 MG tablet Take 1 tablet by mouth every 6 (six) hours as needed. 100 tablet 0  . lisinopril (PRINIVIL,ZESTRIL) 20 MG tablet Take 1 tablet (20 mg total) by mouth 2 (two) times daily. 180 tablet 1  . nitroGLYCERIN (NITROSTAT) 0.4 MG SL tablet Place 1 tablet (0.4 mg total) under the tongue every 5 (five) minutes as needed for chest pain. 25 tablet 3  . pravastatin (PRAVACHOL) 40 MG tablet TAKE 1 TABLET BY MOUTH AT BEDTIME 90 tablet 3  . vitamin C  (ASCORBIC ACID) 500 MG tablet Take 500 mg by mouth daily.    . chlorthalidone (HYGROTON) 25 MG tablet TAKE ONE TABLET BY MOUTH ONCE DAILY 90 tablet 3  . predniSONE (DELTASONE) 20 MG tablet Take two tablets daily for 3 days followed by one tablet daily for 4 days 10 tablet 0  . febuxostat (ULORIC) 40 MG tablet Take 1 tablet (40 mg total) by mouth daily. (Patient not taking: Reported on 03/31/2017) 30 tablet 5   No facility-administered medications prior to visit.      Per HPI unless specifically indicated in ROS section below Review of Systems     Objective:    BP (!) 142/70   Pulse (!) 54   Temp 98.4 F (36.9 C) (Oral)   SpO2 95%   Wt Readings from Last 3 Encounters:  01/07/17 220 lb 12 oz (100.1 kg)  07/15/16 215 lb 8 oz (97.8 kg)  06/17/16 195 lb (88.5 kg)    Physical Exam  Constitutional: He appears well-developed and well-nourished. No distress.  Musculoskeletal: He exhibits edema (R 1st MTP).  No significant pain midline spine No paraspinous mm tenderness Neg SLR bilaterally. No pain with int/ext rotation at hip. Neg FABER. No pain at SIJ, GTB or sciatic notch bilaterally.  He is tender to palpation just posterior to R GTB  Marked swelling and tenderness with mild redness along R 1st MTPJ 2+ DP bilaterally No calf tenderness  or popliteal fullness  Nursing note and vitals reviewed.  Results for orders placed or performed in visit on 01/04/17  Lipid panel  Result Value Ref Range   Cholesterol 198 0 - 200 mg/dL   Triglycerides 334.0 (H) 0.0 - 149.0 mg/dL   HDL 51.70 >39.00 mg/dL   VLDL 66.8 (H) 0.0 - 40.0 mg/dL   Total CHOL/HDL Ratio 4    NonHDL 145.82   Comprehensive metabolic panel  Result Value Ref Range   Sodium 138 135 - 145 mEq/L   Potassium 4.3 3.5 - 5.1 mEq/L   Chloride 103 96 - 112 mEq/L   CO2 29 19 - 32 mEq/L   Glucose, Bld 115 (H) 70 - 99 mg/dL   BUN 34 (H) 6 - 23 mg/dL   Creatinine, Ser 1.30 0.40 - 1.50 mg/dL   Total Bilirubin 1.3 (H) 0.2 - 1.2  mg/dL   Alkaline Phosphatase 55 39 - 117 U/L   AST 23 0 - 37 U/L   ALT 26 0 - 53 U/L   Total Protein 6.8 6.0 - 8.3 g/dL   Albumin 4.3 3.5 - 5.2 g/dL   Calcium 9.4 8.4 - 10.5 mg/dL   GFR 58.69 (L) >60.00 mL/min  Uric acid  Result Value Ref Range   Uric Acid, Serum 10.6 (H) 4.0 - 7.8 mg/dL  CBC with Differential/Platelet  Result Value Ref Range   WBC 6.8 4.0 - 10.5 K/uL   RBC 4.63 4.22 - 5.81 Mil/uL   Hemoglobin 14.3 13.0 - 17.0 g/dL   HCT 41.4 39.0 - 52.0 %   MCV 89.3 78.0 - 100.0 fl   MCHC 34.6 30.0 - 36.0 g/dL   RDW 14.2 11.5 - 15.5 %   Platelets 151.0 150.0 - 400.0 K/uL   Neutrophils Relative % 52.8 43.0 - 77.0 %   Lymphocytes Relative 34.2 12.0 - 46.0 %   Monocytes Relative 9.0 3.0 - 12.0 %   Eosinophils Relative 3.0 0.0 - 5.0 %   Basophils Relative 1.0 0.0 - 3.0 %   Neutro Abs 3.6 1.4 - 7.7 K/uL   Lymphs Abs 2.3 0.7 - 4.0 K/uL   Monocytes Absolute 0.6 0.1 - 1.0 K/uL   Eosinophils Absolute 0.2 0.0 - 0.7 K/uL   Basophils Absolute 0.1 0.0 - 0.1 K/uL  PSA, Medicare  Result Value Ref Range   PSA 0.39 0.10 - 4.00 ng/ml  Hepatitis C antibody  Result Value Ref Range   HCV Ab NEGATIVE NEGATIVE  LDL cholesterol, direct  Result Value Ref Range   Direct LDL 99.0 mg/dL      Assessment & Plan:   Problem List Items Addressed This Visit    Bradycardia    This limits antihypertensive therapy.      Bursitis of right hip    Anticipate R hip bursitis from favoring R leg in acute podagra.  rec ice/heat, provided with hip bursitis exercises, will see if prednisone course helps calm this down.       Encounter for chronic pain management    [place] CSRS reviewed.       Gout    See below. Persistent gout flare despite colchicine and prednisone. Will increase prednisone taper and prolong duration.  Continue vit C, stop chlorthalidone. Reviewed uloric dosing (once acute gout flare has resolved). Still awaiting uloric's [place] insurance response.      HTN (hypertension)    Slightly  elevated today - but pt in acute pain.  Will stop thiazide diuretic which may be contributing to worsening  gout.  Will continue lisinopril, start hydralazine 25mg  bid.       Relevant Medications   hydrALAZINE (APRESOLINE) 25 MG tablet   Narcotic dependence ([place])    Discussed upcoming changes to chronic narcotic management at our office (Q3 mo office visits)      Podagra - Primary    Recurrent, persistent despite colchicine bid and short prednisone burst - will increase prednisone to prolonged stronger 60mg  taper starting today.  Will also stop chlorthalidone thiazide diuretic. Update if not improving with treatment.       Relevant Medications   predniSONE (DELTASONE) 20 MG tablet       Follow up plan: Return if symptoms worsen or fail to improve.  [name], MD

## 2017-03-31 NOTE — Assessment & Plan Note (Signed)
Slightly elevated today - but pt in acute pain.  Will stop thiazide diuretic which may be contributing to worsening gout.  Will continue lisinopril, start hydralazine 25mg  bid.

## 2017-03-31 NOTE — Assessment & Plan Note (Signed)
Anticipate R hip bursitis from favoring R leg in acute podagra.  rec ice/heat, provided with hip bursitis exercises, will see if prednisone course helps calm this down.

## 2017-03-31 NOTE — Assessment & Plan Note (Signed)
[place] CSRS reviewed  ?

## 2017-03-31 NOTE — Assessment & Plan Note (Addendum)
Discussed upcoming changes to chronic narcotic management at our office (Q3 mo office visits)

## 2017-03-31 NOTE — Patient Instructions (Addendum)
Stop chlorthalidone. Continue lisinopril 20mg  twice daily. Start hydralazine 25mg  twice daily.  I think you have persistent gout flare and right hip bursitis. Treat both with higher prednisone course sent to pharmacy.  Exercises provided today.

## 2017-03-31 NOTE — Assessment & Plan Note (Signed)
See below. Persistent gout flare despite colchicine and prednisone. Will increase prednisone taper and prolong duration.  Continue vit C, stop chlorthalidone. Reviewed uloric dosing (once acute gout flare has resolved). Still awaiting uloric's [place] insurance response.

## 2017-04-02 NOTE — Telephone Encounter (Signed)
Fax received indicating approval valid through 07/26/2017. Copy faxed to pharmacy and pt advised via mychart

## 2017-04-05 ENCOUNTER — Encounter: Payer: Self-pay | Admitting: Family Medicine

## 2017-04-05 MED ORDER — HYDRALAZINE HCL 25 MG PO TABS: 25.0000 mg | ORAL_TABLET | Freq: Three times a day (TID) | ORAL | 3 refills | Status: DC

## 2017-04-16 ENCOUNTER — Other Ambulatory Visit: Payer: Self-pay | Admitting: Family Medicine

## 2017-04-17 ENCOUNTER — Encounter: Payer: Self-pay | Admitting: Family Medicine

## 2017-04-17 DIAGNOSIS — M1A09X0 Idiopathic chronic gout, multiple sites, without tophus (tophi): Secondary | ICD-10-CM

## 2017-04-17 DIAGNOSIS — I10 Essential (primary) hypertension: Secondary | ICD-10-CM

## 2017-04-20 MED ORDER — HYDRALAZINE HCL 50 MG PO TABS: 50.0000 mg | ORAL_TABLET | Freq: Three times a day (TID) | ORAL | 3 refills | Status: DC

## 2017-04-20 NOTE — Telephone Encounter (Signed)
Pt left v/m requesting cb at [phone]; today BP is 195/82. Pt request cb 04/20/17. Dr [name] is out of office.

## 2017-04-20 NOTE — Telephone Encounter (Signed)
Pt left v/m that he requested norco few days ago and has not received cb. Pt seen 03/31/17; last printed # 100 on 03/15/17. Pt request cb 04/20/17. Dr [name] out of office.

## 2017-04-21 MED ORDER — HYDROCODONE-ACETAMINOPHEN 10-325 MG PO TABS: 1.0000 | ORAL_TABLET | Freq: Four times a day (QID) | ORAL | 0 refills | Status: DC | PRN

## 2017-04-21 NOTE — Telephone Encounter (Signed)
Spoke with pt notifying him rx is ready to pick up. Placed rx at front office.

## 2017-04-21 NOTE — Telephone Encounter (Signed)
Printed and in CMA box 

## 2017-04-25 NOTE — Telephone Encounter (Signed)
Addressed Friday. See pt email.

## 2017-05-07 MED ORDER — SPIRONOLACTONE 25 MG PO TABS: 25.0000 mg | ORAL_TABLET | Freq: Every day | ORAL | 6 refills | Status: DC

## 2017-05-07 NOTE — Addendum Note (Signed)
Addended by: [name] on: 05/07/2017 09:12 AM   Modules accepted: Orders

## 2017-05-07 NOTE — Telephone Encounter (Signed)
plz call to schedule lab visit only next week to check potassium levels.

## 2017-05-18 ENCOUNTER — Ambulatory Visit: Payer: Medicare HMO

## 2017-05-18 ENCOUNTER — Ambulatory Visit: Payer: Medicare HMO | Admitting: Family Medicine

## 2017-05-18 ENCOUNTER — Other Ambulatory Visit: Payer: Medicare HMO

## 2017-05-19 ENCOUNTER — Ambulatory Visit (INDEPENDENT_AMBULATORY_CARE_PROVIDER_SITE_OTHER): Payer: Medicare HMO | Admitting: Family Medicine

## 2017-05-19 ENCOUNTER — Ambulatory Visit: Payer: Medicare HMO

## 2017-05-19 ENCOUNTER — Other Ambulatory Visit: Payer: Medicare HMO

## 2017-05-19 ENCOUNTER — Encounter: Payer: Self-pay | Admitting: Family Medicine

## 2017-05-19 VITALS — BP 160/80 | HR 48 | Temp 98.0°F | Wt 218.8 lb

## 2017-05-19 DIAGNOSIS — I10 Essential (primary) hypertension: Secondary | ICD-10-CM

## 2017-05-19 DIAGNOSIS — Z23 Encounter for immunization: Secondary | ICD-10-CM | POA: Diagnosis not present

## 2017-05-19 DIAGNOSIS — F1120 Opioid dependence, uncomplicated: Secondary | ICD-10-CM | POA: Diagnosis not present

## 2017-05-19 DIAGNOSIS — E785 Hyperlipidemia, unspecified: Secondary | ICD-10-CM

## 2017-05-19 DIAGNOSIS — R42 Dizziness and giddiness: Secondary | ICD-10-CM | POA: Diagnosis not present

## 2017-05-19 DIAGNOSIS — M1A09X0 Idiopathic chronic gout, multiple sites, without tophus (tophi): Secondary | ICD-10-CM | POA: Diagnosis not present

## 2017-05-19 DIAGNOSIS — R0981 Nasal congestion: Secondary | ICD-10-CM | POA: Diagnosis not present

## 2017-05-19 DIAGNOSIS — G8929 Other chronic pain: Secondary | ICD-10-CM

## 2017-05-19 DIAGNOSIS — R69 Illness, unspecified: Secondary | ICD-10-CM | POA: Diagnosis not present

## 2017-05-19 MED ORDER — PROBENECID 500 MG PO TABS: 250.0000 mg | ORAL_TABLET | Freq: Two times a day (BID) | ORAL | 6 refills | Status: DC

## 2017-05-19 MED ORDER — FLUTICASONE PROPIONATE 50 MCG/ACT NA SUSP: 2.0000 | Freq: Every day | NASAL | 6 refills | Status: DC

## 2017-05-19 MED ORDER — LOSARTAN POTASSIUM 100 MG PO TABS: 100.0000 mg | ORAL_TABLET | Freq: Every day | ORAL | 6 refills | Status: DC

## 2017-05-19 NOTE — Assessment & Plan Note (Addendum)
Chronically uncontrolled. Did not tolerate spironolactone or several other medications. Chronic bradycardia. Will change lisinopril to losartan 100mg  daily for uricosuric effect. Continue hydralazine 50mg  tid. RTC 6 wks f/u visit

## 2017-05-19 NOTE — Assessment & Plan Note (Signed)
He has weaned off gabapentin.  Endorses worse on spironolactone. Will continue to monitor.

## 2017-05-19 NOTE — Progress Notes (Signed)
BP (!) 160/80 (BP Location: Right Arm)   Pulse (!) 48   Temp 98 F (36.7 C) (Oral)   Wt 218 lb 12 oz (99.2 kg)   SpO2 96%   BMI 34.78 kg/m    CC: dizziness, congestion Subjective:    Patient ID: Randy Kennedy, male    DOB: [birth year], 66 y.o.   MRN: [id number]  HPI: Randy Kennedy is a 66 y.o. male presenting on 05/19/2017 for Dizziness (Started about 2 mo ago. Worsened after starting spironolactone); Nasal Congestion (States always congested. Thinks it may be allergies. Also has cough); and Medications (Wants to discuss meds)   HTN - see recent pt emails for details. New regimen is hydralazine 50mg  tid, lisinopril 20mg  bid, and latest addition of spironolactone 25mg  daily - he actually stopped spironolactone due to increased dizziness, nausea/vomiting, and pruritic rash in thighs. Hydralazine did not seem to have any effect on him. Avoiding diuretics as able due to gout (unable to tolerate allopurinol or uloric).   Endorses worsening dizziness over the last 2 months. Increase in nasal congestion, worse with change in weather. Denies fevers/chills. Endorses some hot flashes. No palpitations. Cough that wakes him up at night. Dry cough. Cough present during the day. Facial pressure. Sleeps in recliner chair sitting up.   Gout - has been unable to tolerate allopurinol or uloric. Latest flare was last month. He is managing this with colchicine 0.6mg    He is cutting down on norco use - using sparingly PRN a few times a week.  He also stopped gabapentin.   Relevant past medical, surgical, family and social history reviewed and updated as indicated. Interim medical history since our last visit reviewed. Allergies and medications reviewed and updated. Outpatient Medications Prior to Visit  Medication Sig Dispense Refill  . colchicine 0.6 MG tablet Take 1 tablet (0.6 mg total) by mouth daily.    . hydrALAZINE (APRESOLINE) 50 MG tablet Take 1 tablet (50 mg total) by mouth 3 (three)  times daily. 90 tablet 3  . HYDROcodone-acetaminophen (NORCO) 10-325 MG tablet Take 1 tablet by mouth every 6 (six) hours as needed. 100 tablet 0  . nitroGLYCERIN (NITROSTAT) 0.4 MG SL tablet Place 1 tablet (0.4 mg total) under the tongue every 5 (five) minutes as needed for chest pain. 25 tablet 3  . pravastatin (PRAVACHOL) 40 MG tablet TAKE 1 TABLET BY MOUTH AT BEDTIME 90 tablet 3  . gabapentin (NEURONTIN) 300 MG capsule Take 1 capsule (300 mg total) by mouth 3 (three) times daily. 270 capsule 3  . lisinopril (PRINIVIL,ZESTRIL) 20 MG tablet Take 1 tablet (20 mg total) by mouth 2 (two) times daily. 180 tablet 1  . predniSONE (DELTASONE) 20 MG tablet Take 3 tablets daily for 4 days followed by 2 tablets daily for 4 days followed by 1 tablet daily for 4 days 24 tablet 0  . vitamin C (ASCORBIC ACID) 500 MG tablet Take 500 mg by mouth daily.    Marland Kitchen spironolactone (ALDACTONE) 25 MG tablet Take 1 tablet (25 mg total) by mouth daily. (Patient not taking: Reported on 05/19/2017) 30 tablet 6   No facility-administered medications prior to visit.      Per HPI unless specifically indicated in ROS section below Review of Systems     Objective:    BP (!) 160/80 (BP Location: Right Arm)   Pulse (!) 48   Temp 98 F (36.7 C) (Oral)   Wt 218 lb 12 oz (99.2 kg)   SpO2 96%  BMI 34.78 kg/m   Wt Readings from Last 3 Encounters:  05/19/17 218 lb 12 oz (99.2 kg)  01/07/17 220 lb 12 oz (100.1 kg)  07/15/16 215 lb 8 oz (97.8 kg)    Physical Exam  Constitutional: He appears well-developed and well-nourished. No distress.  HENT:  Head: Normocephalic and atraumatic.  Right Ear: Hearing, tympanic membrane, external ear and ear canal normal.  Left Ear: Hearing, tympanic membrane, external ear and ear canal normal.  Nose: Mucosal edema (R sided green nasal mucous present) present. No rhinorrhea. Right sinus exhibits no maxillary sinus tenderness and no frontal sinus tenderness. Left sinus exhibits no  maxillary sinus tenderness and no frontal sinus tenderness.  Mouth/Throat: Uvula is midline, oropharynx is clear and moist and mucous membranes are normal. No oropharyngeal exudate, posterior oropharyngeal edema, posterior oropharyngeal erythema or tonsillar abscesses.  Eyes: Pupils are equal, round, and reactive to light. Conjunctivae and EOM are normal. No scleral icterus.  Neck: Normal range of motion. Neck supple.  Cardiovascular: Normal rate, regular rhythm, normal heart sounds and intact distal pulses.   No murmur heard. Pulmonary/Chest: Effort normal and breath sounds normal. No respiratory distress. He has no wheezes. He has no rales.  Lymphadenopathy:    He has no cervical adenopathy.  Skin: Skin is warm and dry. No rash noted.  Nursing note and vitals reviewed.  Results for orders placed or performed in visit on 01/04/17  Lipid panel  Result Value Ref Range   Cholesterol 198 0 - 200 mg/dL   Triglycerides 334.0 (H) 0.0 - 149.0 mg/dL   HDL 51.70 >39.00 mg/dL   VLDL 66.8 (H) 0.0 - 40.0 mg/dL   Total CHOL/HDL Ratio 4    NonHDL 145.82   Comprehensive metabolic panel  Result Value Ref Range   Sodium 138 135 - 145 mEq/L   Potassium 4.3 3.5 - 5.1 mEq/L   Chloride 103 96 - 112 mEq/L   CO2 29 19 - 32 mEq/L   Glucose, Bld 115 (H) 70 - 99 mg/dL   BUN 34 (H) 6 - 23 mg/dL   Creatinine, Ser 1.30 0.40 - 1.50 mg/dL   Total Bilirubin 1.3 (H) 0.2 - 1.2 mg/dL   Alkaline Phosphatase 55 39 - 117 U/L   AST 23 0 - 37 U/L   ALT 26 0 - 53 U/L   Total Protein 6.8 6.0 - 8.3 g/dL   Albumin 4.3 3.5 - 5.2 g/dL   Calcium 9.4 8.4 - 10.5 mg/dL   GFR 58.69 (L) >60.00 mL/min  Uric acid  Result Value Ref Range   Uric Acid, Serum 10.6 (H) 4.0 - 7.8 mg/dL  CBC with Differential/Platelet  Result Value Ref Range   WBC 6.8 4.0 - 10.5 K/uL   RBC 4.63 4.22 - 5.81 Mil/uL   Hemoglobin 14.3 13.0 - 17.0 g/dL   HCT 41.4 39.0 - 52.0 %   MCV 89.3 78.0 - 100.0 fl   MCHC 34.6 30.0 - 36.0 g/dL   RDW 14.2 11.5 -  15.5 %   Platelets 151.0 150.0 - 400.0 K/uL   Neutrophils Relative % 52.8 43.0 - 77.0 %   Lymphocytes Relative 34.2 12.0 - 46.0 %   Monocytes Relative 9.0 3.0 - 12.0 %   Eosinophils Relative 3.0 0.0 - 5.0 %   Basophils Relative 1.0 0.0 - 3.0 %   Neutro Abs 3.6 1.4 - 7.7 K/uL   Lymphs Abs 2.3 0.7 - 4.0 K/uL   Monocytes Absolute 0.6 0.1 - 1.0 K/uL  Eosinophils Absolute 0.2 0.0 - 0.7 K/uL   Basophils Absolute 0.1 0.0 - 0.1 K/uL  PSA, Medicare  Result Value Ref Range   PSA 0.39 0.10 - 4.00 ng/ml  Hepatitis C antibody  Result Value Ref Range   HCV Ab NEGATIVE NEGATIVE  LDL cholesterol, direct  Result Value Ref Range   Direct LDL 99.0 mg/dL      Assessment & Plan:   Problem List Items Addressed This Visit    Dizziness    He has weaned off gabapentin.  Endorses worse on spironolactone. Will continue to monitor.       Encounter for chronic pain management    He has decided to wean off narcotic. Down to norco a few times a week.       Gout - Primary    Chronic, currently managed with colchicine 0.6mg  bid. Did not tolerate xanthine oxidase inhibitors. Will try uricosuric probenecid 250mg  bid. Consider titration as tolerated.  Start losartan. Discussed possible fenofibrate.       HLD (hyperlipidemia)    Compliant with pravastatin. Consider fenofibrate for uricosuric effect      Relevant Medications   losartan (COZAAR) 100 MG tablet   HTN (hypertension)    Chronically uncontrolled. Did not tolerate spironolactone or several other medications. Chronic bradycardia. Will change lisinopril to losartan 100mg  daily for uricosuric effect. Continue hydralazine 50mg  tid. RTC 6 wks f/u visit      Relevant Medications   losartan (COZAAR) 100 MG tablet   Narcotic dependence ([place])    He has decided to wean off narcotic - decreasing usage to one pill a few times a week. Will continue to monitor.       Nasal congestion    Chronic issue, worse recently. Possibly early sinusitis. rec  flonase, nasal saline irrigation, if no better to let me know for abx course to cover bacterial sinusitis.          Follow up plan: Return in about 6 weeks (around 06/30/2017) for follow up visit.  [name], MD

## 2017-05-19 NOTE — Addendum Note (Signed)
Addended by: Brenton Grills on: [id number]/3692 12:45 PM   Modules accepted: Orders

## 2017-05-19 NOTE — Assessment & Plan Note (Signed)
Compliant with pravastatin. Consider fenofibrate for uricosuric effect

## 2017-05-19 NOTE — Assessment & Plan Note (Addendum)
He has decided to wean off narcotic - decreasing usage to one pill a few times a week. Will continue to monitor.

## 2017-05-19 NOTE — Assessment & Plan Note (Signed)
He has decided to wean off narcotic. Down to norco a few times a week.

## 2017-05-19 NOTE — Assessment & Plan Note (Signed)
Chronic issue, worse recently. Possibly early sinusitis. rec flonase, nasal saline irrigation, if no better to let me know for abx course to cover bacterial sinusitis.

## 2017-05-19 NOTE — Patient Instructions (Addendum)
Flu shot today. New blood pressure regimen is losartan 100mg  daily, hydralazine 50mg  three times daily.  New gout regimen will be probenecid 250mg  twice daily and colchicine twice daily.  For nasal congestion start nasal steroid 2 sprays in each nostril daily and nasal saline irrigation throughout the day. If fever >101, or worsening congestion despite this, let me know for antibiotic course. Return in 4-6 weeks for follow up visit.

## 2017-05-19 NOTE — Assessment & Plan Note (Signed)
Chronic, currently managed with colchicine 0.6mg  bid. Did not tolerate xanthine oxidase inhibitors. Will try uricosuric probenecid 250mg  bid. Consider titration as tolerated.  Start losartan. Discussed possible fenofibrate.

## 2017-05-27 ENCOUNTER — Encounter: Payer: Self-pay | Admitting: Family Medicine

## 2017-05-27 DIAGNOSIS — I10 Essential (primary) hypertension: Secondary | ICD-10-CM

## 2017-05-28 MED ORDER — PREDNISONE 20 MG PO TABS: ORAL_TABLET | ORAL | 0 refills | Status: DC

## 2017-05-28 MED ORDER — HYDRALAZINE HCL 100 MG PO TABS: 50.0000 mg | ORAL_TABLET | Freq: Two times a day (BID) | ORAL | 3 refills | Status: DC

## 2017-06-07 MED ORDER — HYDRALAZINE HCL 100 MG PO TABS: 100.0000 mg | ORAL_TABLET | Freq: Two times a day (BID) | ORAL | 3 refills | Status: DC

## 2017-06-07 NOTE — Telephone Encounter (Signed)
Cards referral for difficult to control hypertension, multiple drug intolerances.

## 2017-06-07 NOTE — Addendum Note (Signed)
Addended by: [name] on: 06/07/2017 09:02 AM   Modules accepted: Orders

## 2017-06-09 ENCOUNTER — Ambulatory Visit (INDEPENDENT_AMBULATORY_CARE_PROVIDER_SITE_OTHER): Payer: Medicare HMO | Admitting: Nurse Practitioner

## 2017-06-09 ENCOUNTER — Encounter: Payer: Self-pay | Admitting: Nurse Practitioner

## 2017-06-09 VITALS — BP 170/80 | HR 49 | Ht 67.0 in | Wt 226.5 lb

## 2017-06-09 DIAGNOSIS — R011 Cardiac murmur, unspecified: Secondary | ICD-10-CM

## 2017-06-09 DIAGNOSIS — I10 Essential (primary) hypertension: Secondary | ICD-10-CM

## 2017-06-09 MED ORDER — MECLIZINE HCL 25 MG PO TABS: 25.0000 mg | ORAL_TABLET | Freq: Three times a day (TID) | ORAL | 3 refills | Status: DC | PRN

## 2017-06-09 MED ORDER — HYDRALAZINE HCL 100 MG PO TABS: 100.0000 mg | ORAL_TABLET | Freq: Three times a day (TID) | ORAL | 3 refills | Status: DC

## 2017-06-09 NOTE — Progress Notes (Signed)
Office Visit    Patient Name: Randy Kennedy Date of Encounter: 06/09/2017  Primary Care Provider:  [name], MD Primary Cardiologist:  Johnny Bridge, MD   Chief Complaint    66 y/o ? with a history of hypertension, hyperlipidemia, obesity, sleep apnea, and chronic pain who presents for follow-up related to elevated blood pressures.  Past Medical History    Past Medical History:  Diagnosis Date  . Allergic rhinitis   . Arthritis   . Chest pain    a. 11/2011 reportedly normal stress test performed in [place].  . Chronic pain    L ankle and bilateral hips (remote fracture s/p surgeries), lower back (told has herniated disc)  . Closed avulsion fracture of right talus 06/2016  . Eczema   . Erectile dysfunction 09/14/2012  . Gilbert disease   . Gout 2007  . Hearing loss    otosclerosis  . Heartburn    mild, controlled with pepcid  . History of chicken pox   . 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 HLD (hyperlipidemia)   . HTN (hypertension)    difficult to control - clonidine and beta blockers caused bradycardia  . Morbid obesity ([place])   . 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 OSA (obstructive sleep apnea) 11/2011 sleep study   a. moderate, AHI 37.4 increased to 80 in REM, on BiPAP 14/10, 97% compliance >4 hrs (06/2013); b. Does not tolerate CPAP.  Marland Kitchen RLS (restless legs syndrome)   . 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 Treated with vicodin, prior tried fentanyl and butrans.  . Systolic murmur    Past Surgical History:  Procedure Laterality Date  . COLONOSCOPY  02/2011   ext hem, benign polyp, rpt 5 yrs ([place])  . LEG SURGERY  x5   left - after fall at work (3.5 stories)  . MANDIBLE SURGERY  1965   jaw fracture - horse kick  . TYMPANIC MEMBRANE REPAIR Left  1994   otosclerosis    Allergies  Allergies  Allergen Reactions  . Allopurinol Other (See Comments)    GI upset  . Lisinopril-Hydrochlorothiazide     Burning in feet.  . Procardia [Nifedipine] Other (See Comments)    Bradycardia, leg swelling  . Spironolactone Other (See Comments)    malaise, dizziness  . Amlodipine Rash    BLE  . Uloric [Febuxostat] Rash    History of Present Illness    66 y/o ? with a history of hypertension, hyperlipidemia, obesity, sleep apnea, gout, chronic pain, and chronic dizziness.  Also has prior history of stress testing in 2013 in [place], which was reportedly normal.  Over the summer, he had to be taken off of chlorthalidone secondary to gout flare.  Unfortunately, he has multiple intolerances to antihypertensives and is currently taking hydralazine 100 mg twice daily in addition to losartan 100 mg daily.  Chronic sinus bradycardia prevents utilization of beta-blockers.  Over the past few months, pressures have been running quite high with systolics in the [address]W-[address].  Currently is taking his hydralazine at about 10 AM and then again at 10 PM.  He says pressures really seem to run up after dinner.  He denies chest pain but does have chronic, stable dyspnea on exertion.  No PND, orthopnea,  dizziness, syncope, or early satiety.  He sometimes experiences left ankle swelling which is chronic.  Home Medications    Prior to Admission medications   Medication Sig Start Date End Date Taking? Authorizing Provider  colchicine 0.6 MG tablet Take 1 tablet (0.6 mg total) by mouth daily. 05/07/17  Yes [name], MD  fluticasone [facility]) 50 MCG/ACT nasal spray Place 2 sprays into both nostrils daily. 05/19/17  Yes [name], MD  hydrALAZINE (APRESOLINE) 100 MG tablet Take 1 tablet (100 mg total) 3 (three) times daily by mouth. 06/09/17  Yes Rogelia Mire, NP  HYDROcodone-acetaminophen (NORCO) 10-325 MG tablet Take 1 tablet by mouth every 6 (six)  hours as needed. 04/21/17  Yes [name], MD  losartan (COZAAR) 100 MG tablet Take 1 tablet (100 mg total) by mouth daily. 05/19/17  Yes [name], MD  nitroGLYCERIN (NITROSTAT) 0.4 MG SL tablet Place 1 tablet (0.4 mg total) under the tongue every 5 (five) minutes as needed for chest pain. 02/16/13  Yes Minna Merritts, MD  pravastatin (PRAVACHOL) 40 MG tablet TAKE 1 TABLET BY MOUTH AT BEDTIME 03/01/17  Yes [name], MD  probenecid (BENEMID) 500 MG tablet Take 0.5 tablets (250 mg total) by mouth 2 (two) times daily. 05/19/17  Yes [name], MD  vitamin C (ASCORBIC ACID) 500 MG tablet Take 500 mg by mouth daily.   Yes [provider]  meclizine (ANTIVERT) 25 MG tablet Take 1 tablet (25 mg total) 3 (three) times daily as needed by mouth for dizziness. 06/09/17   Rogelia Mire, NP    Review of Systems    Elevated blood pressures as outlined above.  Some degree of chronic dyspnea on exertion.  Occasional left ankle swelling.  He denies chest pain, palpitations, PND, orthopnea, dizziness, syncope, or early satiety..  All other systems reviewed and are otherwise negative except as noted above.  Physical Exam    VS:  BP (!) 170/80 (BP Location: Right Arm, Patient Position: Sitting, Cuff Size: Normal)   Pulse (!) 49   Ht 5\' 7"  (1.702 m)   Wt 226 lb 8 oz (102.7 kg)   BMI 35.47 kg/m  , BMI Body mass index is 35.47 kg/m. GEN: Well nourished, well developed, in no acute distress.  HEENT: normal.  Neck: Supple, no JVD, carotid bruits, or masses. Cardiac: RRR, 2/6 systolic ejection murmur at the upper sternal borders, no rubs, or gallops. No clubbing, cyanosis, trace left malleolar edema.  Radials/DP/PT 2+ and equal bilaterally.  Respiratory:  Respirations regular and unlabored, clear to auscultation bilaterally. GI: Soft, nontender, nondistended, BS + x 4. MS: no deformity or atrophy. Skin: warm and dry, no rash. Neuro:  Strength and sensation are  intact. Psych: Normal affect.  Accessory Clinical Findings    ECG -sinus bradycardia, 49, right bundle branch block, LVH, no acute ST or T changes.  Assessment & Plan    1.  Essential hypertension: Previously controlled when taking chlorthalidone in addition to hydralazine and losartan however, diuretic therapy was discontinued in the setting of gout flare for which she is now taking colchicine and probenecid.  He is still dealing with gout pain prefer to not go back on diuretic therapy.  In reviewing his current medications, he is currently taking hydralazine 100 mg twice daily.  I suspect he would have improved control by changing this to 3 times daily.  It appears that he does have some rebound hypertension after his morning dose wears off prior to his evening  dose.  He is willing to increase hydralazine to 100 mill grams 3 times daily.  Continue losartan 100 mg daily.  If this is insufficient in getting his blood pressure into a more manageable area, which I suspect it will be, we may need to consider something like minoxidil next.  He was on doxazosin at some point but this came off of his list last August.  He says he does not remember ever being on this.  His bradycardia prevents [place] from using beta blockers and clonidine.  Further, he has previous intolerances to both Procardia and amlodipine in the setting of lower extremity swelling.  2.  Systolic murmur: He also has chronic dyspnea on exertion.  Follow-up echocardiogram.  3.  Obstructive sleep apnea: Noncompliant with CPAP which he says really upsets his allergies.  As result of not using this, he does not sleep well and has daytime somnolence and chronic fatigue.  He says he has had multiple sleep studies and has tried multiple modalities of CPAP without any success.  4.  Morbid obesity: We discussed the importance of activity and calorie restriction with a goal for weight loss as this by itself would significantly improve blood pressure  control.  5.  Disposition: Follow-up echocardiogram.  Follow-up in the clinic in 1 month or sooner if necessary.  Murray Hodgkins, NP 06/09/2017, 5:00 PM

## 2017-06-09 NOTE — Patient Instructions (Addendum)
Medication Instructions:  Your physician has recommended you make the following change in your medication:  INCREASE hydralazine to 100mg  three times a day You may take meclizine 25mg  three times a day AS NEEDED for dizzines  Labwork: none  Testing/Procedures: Your physician has requested that you have an echocardiogram. Echocardiography is a painless test that uses sound waves to create images of your heart. It provides your doctor with information about the size and shape of your heart and how well your heart's chambers and valves are working. This procedure takes approximately one hour. There are no restrictions for this procedure.    Follow-Up: Your physician recommends that you schedule a follow-up appointment in: 1 month with Dr. Rockey Situ, Ignacia Bayley, NP, or Christell Faith, PA-C    Any Other Special Instructions Will Be Listed Below (If Applicable).     If you need a refill on your cardiac medications before your next appointment, please call your pharmacy.  Echocardiogram An echocardiogram, or echocardiography, uses sound waves (ultrasound) to produce an image of your heart. The echocardiogram is simple, painless, obtained within a short period of time, and offers valuable information to your health care provider. The images from an echocardiogram can provide information such as:  Evidence of coronary artery disease (CAD).  Heart size.  Heart muscle function.  Heart valve function.  Aneurysm detection.  Evidence of a past heart attack.  Fluid buildup around the heart.  Heart muscle thickening.  Assess heart valve function.  Tell a health care provider about:  Any allergies you have.  All medicines you are taking, including vitamins, herbs, eye drops, creams, and over-the-counter medicines.  Any problems you or family members have had with anesthetic medicines.  Any blood disorders you have.  Any surgeries you have had.  Any medical conditions you  have.  Whether you are pregnant or may be pregnant. What happens before the procedure? No special preparation is needed. Eat and drink normally. What happens during the procedure?  In order to produce an image of your heart, gel will be applied to your chest and a wand-like tool (transducer) will be moved over your chest. The gel will help transmit the sound waves from the transducer. The sound waves will harmlessly bounce off your heart to allow the heart images to be captured in real-time motion. These images will then be recorded.  You may need an IV to receive a medicine that improves the quality of the pictures. What happens after the procedure? You may return to your normal schedule including diet, activities, and medicines, unless your health care provider tells you otherwise. This information is not intended to replace advice given to you by your health care provider. Make sure you discuss any questions you have with your health care provider. Document Released: 07/10/2000 Document Revised: 02/29/2016 Document Reviewed: 03/20/2013 Elsevier Interactive Patient Education  2017 [employer].

## 2017-06-11 ENCOUNTER — Other Ambulatory Visit: Payer: Self-pay

## 2017-06-11 ENCOUNTER — Ambulatory Visit (INDEPENDENT_AMBULATORY_CARE_PROVIDER_SITE_OTHER): Payer: Medicare HMO

## 2017-06-11 DIAGNOSIS — R011 Cardiac murmur, unspecified: Secondary | ICD-10-CM | POA: Diagnosis not present

## 2017-06-15 ENCOUNTER — Encounter: Payer: Self-pay | Admitting: Family Medicine

## 2017-06-15 DIAGNOSIS — G2581 Restless legs syndrome: Secondary | ICD-10-CM

## 2017-06-15 DIAGNOSIS — G47429 Narcolepsy in conditions classified elsewhere without cataplexy: Secondary | ICD-10-CM

## 2017-06-15 DIAGNOSIS — R5382 Chronic fatigue, unspecified: Secondary | ICD-10-CM

## 2017-06-15 DIAGNOSIS — G4733 Obstructive sleep apnea (adult) (pediatric): Secondary | ICD-10-CM

## 2017-06-19 NOTE — Telephone Encounter (Signed)
OSA referral to neurology.

## 2017-06-27 ENCOUNTER — Encounter: Payer: Self-pay | Admitting: Family Medicine

## 2017-06-27 ENCOUNTER — Encounter: Payer: Self-pay | Admitting: Nurse Practitioner

## 2017-06-28 ENCOUNTER — Telehealth: Payer: Self-pay | Admitting: Nurse Practitioner

## 2017-06-28 ENCOUNTER — Other Ambulatory Visit: Payer: Self-pay

## 2017-06-28 MED ORDER — DOXAZOSIN MESYLATE 2 MG PO TABS: 2.0000 mg | ORAL_TABLET | Freq: Every day | ORAL | 5 refills | Status: DC

## 2017-06-28 NOTE — Telephone Encounter (Signed)
Pt reports continued elevated BP with no improvement even though he has increased hydralazine to 100mg  TID (8am, 4pm, and midnight) as instructed at Nov 14 appt. He also takes losartan 100mg  at 8am.  He has resumed using bipap and when he does, SBP increases to 190. He took it off last night and slept in the chair. Pt monitors BP at least once daily and reports SBP typically 170s.  BP 174/81 around noon today. HR runs upper 40s-50s He has had slight SOB and a headache.  Advised to take second dose of hydralazine now but he is unsure as his next dose would then be at midnight.    Per Ignacia Bayley, NP, notes: "He is willing to increase hydralazine to 100 mill grams 3 times daily.  Continue losartan 100 mg daily.  If this is insufficient in getting his blood pressure into a more manageable area, which I suspect it will be, we may need to consider something like minoxidil next.  He was on doxazosin at some point but this came off of his list last August.  He says he does not remember ever being on this.  His bradycardia prevents [place] from using beta blockers and clonidine.  Further, he has previous intolerances to both Procardia and amlodipine in the setting of lower extremity swelling."  Will route to Ignacia Bayley for medication recommendation.

## 2017-06-28 NOTE — Telephone Encounter (Signed)
Called pt regarding his MyChart message about continued elevated BP. Left message on machine for patient to contact the office.

## 2017-06-28 NOTE — Telephone Encounter (Addendum)
Reviewed recommendations w/pt who is agreeable to doxazosin 2mg  qd. He understands to continue all other medications as prescribed, monitor BP and call if pressures are not improved.  Prescription sent to [place], [facility].

## 2017-06-28 NOTE — Telephone Encounter (Signed)
Pt is returning your call

## 2017-06-28 NOTE — Telephone Encounter (Signed)
I've reviewed notes from last year.  He had previously been on Doxazosin 4mg  daily but it was stopped 02/2016 in the setting of complaints of dizziness and low energy.  If those symptoms did not dramatically change after coming off of doxazosin, I'd prefer to add that back instead of adding a stronger, older agent like minoxidil.  With his blood pressures trending as high as they are, I am less worried about sudden big drops/orthostasis.  I'd prefer to add doxazosin 2mg  daily for starters. This can be titrated further if necessary.

## 2017-07-08 NOTE — Progress Notes (Signed)
Cardiology Office Note  Date:  07/09/2017   ID:  Randy Kennedy, DOB 19-Sep-1950, MRN [id number]  PCP:  [name], MD   Chief Complaint  Patient presents with  . OTHER    1 month f/u c/o elevated BP, sob and edema ankles. Meds reviewed verbally with pt.    HPI:  Randy Kennedy is a  pleasant 66 year old gentleman with a history of  RLS,  chronic burning in his feet/neuropathy,  hypertension,  hyperlipidemia,  OSA  ,not been using his bipap/cpap,  previously seen for chest tightness.  Who presents for previous history of bradycardia and hypertension  In follow-up today he reports his blood pressure is running high Blood pressure numbers at home typically 170 Often misses the hydralazine in the middle of the day Chlorthalidone previously held for suspected gout  Problems in the past on calcium channel blockers causing leg edema  Sleeping in the chair Not using CPAP.  Has chronic sinus issues, headaches when he uses CPAP Not sleeping well  Long history of bradycardia Asymptomatic  Denies any significant chest pain for shortness of breath. She is sedentary, no regular exercise, weight has been trending upwards.   Lab work reviewed total chol 165, LDL 77  Chronic legs and arm numbness and tingling.   Wife has to drive all the time secondary to fatigue  EKG on today's visit shows sinus bradycardia with rate 55 bpm, right bundle branch block , no significant ST or T-wave changes    other past medical history  Reports that he had a negative stress test in 2013. This was a pharmacologic Myoview dated May 2013. He was unable to treadmill He was seen previously by cardiology in [place]. Notes indicate history of difficult to control blood pressure, hyperlipidemia, obesity, disabled. No smoking history He was started several years on Procardia XL 30 mg daily. This was increased in [place]  Previous episode of chest tightness while he was swimming in a pool. He had to  hold onto the side of the pole. Mild dizziness.   Notes from prior cardiologist indicate previous echocardiogram showing diastolic dysfunction  Grandfather died of a heart attack at 85, father doing well in his 80s as well as his mother PMH:   has a past medical history of Allergic rhinitis, Arthritis, Chest pain, Chronic pain, Closed avulsion fracture of right talus (06/2016), Eczema, Erectile dysfunction (09/14/2012), [name] disease, Gout (2007), Hearing loss, Heartburn, History of chicken pox, History of hepatitis B, HLD (hyperlipidemia), HTN (hypertension), Morbid obesity ([place]), Narcolepsy (2006), OSA (obstructive sleep apnea) (11/2011 sleep study), RLS (restless legs syndrome), RSD lower limb (11/27/5463), and Systolic murmur.  PSH:    Past Surgical History:  Procedure Laterality Date  . COLONOSCOPY  02/2011   ext hem, benign polyp, rpt 5 yrs ([place])  . LEG SURGERY  x5   left - after fall at work (3.5 stories)  . MANDIBLE SURGERY  1965   jaw fracture - horse kick  . TYMPANIC MEMBRANE REPAIR Left 1994   otosclerosis    Current Outpatient Medications  Medication Sig Dispense Refill  . colchicine 0.6 MG tablet Take 1 tablet (0.6 mg total) by mouth daily.    Marland Kitchen doxazosin (CARDURA) 4 MG tablet Take 1 tablet (4 mg total) by mouth 2 (two) times daily. 180 tablet 3  . fluticasone (FLONASE) 50 MCG/ACT nasal spray Place 2 sprays into both nostrils daily. 16 g 6  . HYDROcodone-acetaminophen (NORCO) 10-325 MG tablet Take 1 tablet by mouth every 6 (six) hours  as needed. 100 tablet 0  . losartan (COZAAR) 100 MG tablet Take 1 tablet (100 mg total) by mouth daily. 30 tablet 6  . meclizine (ANTIVERT) 25 MG tablet Take 1 tablet (25 mg total) 3 (three) times daily as needed by mouth for dizziness. 30 tablet 3  . nitroGLYCERIN (NITROSTAT) 0.4 MG SL tablet Place 1 tablet (0.4 mg total) under the tongue every 5 (five) minutes as needed for chest pain. 25 tablet 3  . pravastatin (PRAVACHOL) 40 MG tablet TAKE  1 TABLET BY MOUTH AT BEDTIME 90 tablet 3  . probenecid (BENEMID) 500 MG tablet Take 0.5 tablets (250 mg total) by mouth 2 (two) times daily. 30 tablet 6  . vitamin C (ASCORBIC ACID) 500 MG tablet Take 500 mg by mouth daily.    . isosorbide mononitrate (IMDUR) 30 MG 24 hr tablet Take 1 tablet (30 mg total) by mouth daily. 90 tablet 3   No current facility-administered medications for this visit.      Allergies:   Allopurinol; Lisinopril-hydrochlorothiazide; Procardia [nifedipine]; Spironolactone; Amlodipine; and Uloric [febuxostat]   Social History:  The patient  reports that he quit smoking about 38 years ago. His smoking use included pipe. He has a 4.00 pack-year smoking history. he has never used smokeless tobacco. He reports that he does not drink alcohol or use drugs.   Family History:   family history includes Cancer in his father, mother, and paternal uncle; Hypertension in his mother.    Review of Systems: Review of Systems  Constitutional: Positive for malaise/fatigue.  HENT: Positive for congestion.   Respiratory: Negative.   Cardiovascular: Negative.   Gastrointestinal: Negative.   Musculoskeletal: Negative.   Neurological: Negative.        Numbness in his legs and arms, tingling  Psychiatric/Behavioral: Negative.   All other systems reviewed and are negative.    PHYSICAL EXAM: VS:  BP 140/78 (BP Location: Left Arm, Patient Position: Sitting, Cuff Size: Normal)   Pulse (!) 55   Ht 5\' 7"  (1.702 m)   Wt 228 lb 8 oz (103.6 kg)   BMI 35.79 kg/m  , BMI Body mass index is 35.79 kg/m. GEN: Well nourished, well developed, in no acute distress , obese HEENT: normal  Neck: no JVD, carotid bruits, or masses Cardiac: RRR; no murmurs, rubs, or gallops,no edema  Respiratory:  clear to auscultation bilaterally, normal work of breathing GI: soft, nontender, nondistended, + BS MS: no deformity or atrophy  Skin: warm and dry, no rash Neuro:  Strength and sensation are  intact Psych: euthymic mood, full affect    Recent Labs: 01/04/2017: ALT 26; BUN 34; Creatinine, Ser 1.30; Hemoglobin 14.3; Platelets 151.0; Potassium 4.3; Sodium 138    Lipid Panel Lab Results  Component Value Date   CHOL 198 01/04/2017   HDL 51.70 01/04/2017   LDLCALC 77 08/07/2015   TRIG 334.0 (H) 01/04/2017      Wt Readings from Last 3 Encounters:  07/09/17 228 lb 8 oz (103.6 kg)  06/09/17 226 lb 8 oz (102.7 kg)  05/19/17 218 lb 12 oz (99.2 kg)       ASSESSMENT AND PLAN:  Essential hypertension - Plan: EKG 12-Lead 140s on today's visit He is not taking his hydralazine as prescribed Recommended he increase Cardura up to 4 mg twice daily, start Imdur 30 mg daily and stop hydralazine Recommended he closely monitor blood pressure and call our office with numbers  RLS (restless legs syndrome) - Plan: EKG 12-Lead Previously managed with gabapentin,  has chronic fatigue  Chronic pain - Plan: EKG 12-Lead Managed with gabapentin Managed by primary care  Bradycardia - Plan: EKG 12-Lead  asymptomatic, no orthostasis symptoms.  Rate acceptable today We will try to avoid clonidine, beta-blockers, diltiazem/verapamil  OSA (obstructive sleep apnea) - Plan: EKG 12-Lead Currently not using his CPAP/BIPAP, likely contributing to his fatigue and insomnia, poor sleep hygiene.  Unable to tolerate  Fatigue Likely multifactorial. deconditioned at baseline Apnea likely playing a role   Total encounter time more than 25 minutes  Greater than 50% was spent in counseling and coordination of care with the patient  Disposition:   F/U  6 months   Orders Placed This Encounter  Procedures  . EKG 12-Lead     Signed, Esmond Plants, M.D., Ph.D. 07/09/2017  [place], [place]

## 2017-07-09 ENCOUNTER — Ambulatory Visit: Payer: Medicare HMO | Admitting: Cardiovascular Disease

## 2017-07-09 ENCOUNTER — Encounter: Payer: Self-pay | Admitting: Cardiovascular Disease

## 2017-07-09 VITALS — BP 140/78 | HR 55 | Ht 67.0 in | Wt 228.5 lb

## 2017-07-09 DIAGNOSIS — R42 Dizziness and giddiness: Secondary | ICD-10-CM

## 2017-07-09 DIAGNOSIS — R5382 Chronic fatigue, unspecified: Secondary | ICD-10-CM

## 2017-07-09 DIAGNOSIS — I10 Essential (primary) hypertension: Secondary | ICD-10-CM

## 2017-07-09 DIAGNOSIS — G2581 Restless legs syndrome: Secondary | ICD-10-CM

## 2017-07-09 DIAGNOSIS — G4733 Obstructive sleep apnea (adult) (pediatric): Secondary | ICD-10-CM

## 2017-07-09 DIAGNOSIS — E782 Mixed hyperlipidemia: Secondary | ICD-10-CM

## 2017-07-09 MED ORDER — DOXAZOSIN MESYLATE 4 MG PO TABS: 4.0000 mg | ORAL_TABLET | Freq: Two times a day (BID) | ORAL | 3 refills | Status: DC

## 2017-07-09 MED ORDER — ISOSORBIDE MONONITRATE ER 30 MG PO TB24: 30.0000 mg | ORAL_TABLET | Freq: Every day | ORAL | 3 refills | Status: DC

## 2017-07-09 NOTE — Patient Instructions (Signed)
Medication Instructions:   Please increase the cardura up to 4 mg twice a day Stop the hydralazine Start imdur/isosorbide 30 mg once a day  Labwork:  No new labs needed  Testing/Procedures:  No further testing at this time  Follow-Up: It was a pleasure seeing you in the office today. Please call us if you have new issues that need to be addressed before your next appt.  [phone]  Your physician wants you to follow-up in: 6 months.  You will receive a reminder letter in the mail two months in advance. If you don't receive a letter, please call our office to schedule the follow-up appointment.  If you need a refill on your cardiac medications before your next appointment, please call your pharmacy.

## 2017-07-13 ENCOUNTER — Other Ambulatory Visit: Payer: Self-pay | Admitting: Family Medicine

## 2017-07-13 DIAGNOSIS — G8929 Other chronic pain: Secondary | ICD-10-CM

## 2017-07-13 NOTE — Telephone Encounter (Signed)
Last printed:  04/21/17, #100 Last OV:  05/19/17 Next OV:  None  Pt wants to use [facility]

## 2017-07-14 ENCOUNTER — Telehealth: Payer: Self-pay

## 2017-07-14 ENCOUNTER — Other Ambulatory Visit: Payer: Self-pay

## 2017-07-14 MED ORDER — HYDROCODONE-ACETAMINOPHEN 10-325 MG PO TABS: 1.0000 | ORAL_TABLET | Freq: Four times a day (QID) | ORAL | 0 refills | Status: DC | PRN

## 2017-07-14 NOTE — Telephone Encounter (Signed)
Christie at [facility] needs to know if chronic condition and if so what is condition for hydrocodone apap.Chronic pain mgt; Idiopathic chronic gout with multiple sites. Adonis Brook said that was all needed.

## 2017-07-14 NOTE — Telephone Encounter (Signed)
[place] is requesting 90 day rx for insurance

## 2017-07-14 NOTE — Telephone Encounter (Signed)
Left message on vm per dpr notifying pt hydrocodone rx was sent electronically to pharmacy with 90 tabs since pt is weaning off.

## 2017-07-14 NOTE — Telephone Encounter (Signed)
plz notify this was sent electronically #90 as he's weaning off

## 2017-07-16 ENCOUNTER — Encounter: Payer: Self-pay | Admitting: Cardiovascular Disease

## 2017-07-16 MED ORDER — LOSARTAN POTASSIUM 100 MG PO TABS: 100.0000 mg | ORAL_TABLET | Freq: Every day | ORAL | 3 refills | Status: DC

## 2017-07-25 ENCOUNTER — Encounter: Payer: Self-pay | Admitting: Family Medicine

## 2017-07-25 DIAGNOSIS — M1A09X0 Idiopathic chronic gout, multiple sites, without tophus (tophi): Secondary | ICD-10-CM

## 2017-07-27 MED ORDER — PREDNISONE 20 MG PO TABS: ORAL_TABLET | ORAL | 0 refills | Status: DC

## 2017-08-04 ENCOUNTER — Institutional Professional Consult (permissible substitution): Payer: Self-pay | Admitting: Neurology

## 2017-08-13 ENCOUNTER — Other Ambulatory Visit: Payer: Self-pay | Admitting: Family Medicine

## 2017-08-16 NOTE — Telephone Encounter (Signed)
Last rx:  07/14/17, #30 Last OV: 05/20/47 Next OV:  None  Pt request rx [facility] to [facility]

## 2017-08-18 NOTE — Telephone Encounter (Signed)
Pt is calling to follow up on the status of this RX being sent in. Please advise

## 2017-08-19 ENCOUNTER — Encounter: Payer: Self-pay | Admitting: Family Medicine

## 2017-08-19 MED ORDER — HYDROCODONE-ACETAMINOPHEN 10-325 MG PO TABS: 1.0000 | ORAL_TABLET | Freq: Four times a day (QID) | ORAL | 0 refills | Status: DC | PRN

## 2017-08-19 NOTE — Telephone Encounter (Signed)
Sent electronically 

## 2017-08-21 MED ORDER — GABAPENTIN 300 MG PO CAPS: 300.0000 mg | ORAL_CAPSULE | Freq: Three times a day (TID) | ORAL | 1 refills | Status: DC

## 2017-08-24 ENCOUNTER — Other Ambulatory Visit: Payer: Self-pay

## 2017-08-24 ENCOUNTER — Emergency Department: Payer: PPO

## 2017-08-24 ENCOUNTER — Emergency Department
Admission: EM | Admit: 2017-08-24 | Discharge: 2017-08-24 | Disposition: A | Discharge status: Home / Self Care | Payer: PPO | Attending: Emergency Medicine | Admitting: Emergency Medicine

## 2017-08-24 ENCOUNTER — Encounter: Payer: Self-pay | Admitting: Emergency Medicine

## 2017-08-24 DIAGNOSIS — I10 Essential (primary) hypertension: Secondary | ICD-10-CM | POA: Insufficient documentation

## 2017-08-24 DIAGNOSIS — Z79899 Other long term (current) drug therapy: Secondary | ICD-10-CM | POA: Diagnosis not present

## 2017-08-24 DIAGNOSIS — Z87891 Personal history of nicotine dependence: Secondary | ICD-10-CM | POA: Insufficient documentation

## 2017-08-24 DIAGNOSIS — R42 Dizziness and giddiness: Secondary | ICD-10-CM | POA: Insufficient documentation

## 2017-08-24 DIAGNOSIS — R079 Chest pain, unspecified: Secondary | ICD-10-CM

## 2017-08-24 DIAGNOSIS — R0789 Other chest pain: Secondary | ICD-10-CM | POA: Diagnosis not present

## 2017-08-24 LAB — COMPREHENSIVE METABOLIC PANEL
ALBUMIN: 4.3 g/dL (ref 3.5–5.0)
ALT: 20 U/L (ref 17–63)
AST: 27 U/L (ref 15–41)
Alkaline Phosphatase: 72 U/L (ref 38–126)
Anion gap: 7 (ref 5–15)
BUN: 16 mg/dL (ref 6–20)
CHLORIDE: 105 mmol/L (ref 101–111)
CO2: 26 mmol/L (ref 22–32)
Calcium: 8.9 mg/dL (ref 8.9–10.3)
Creatinine, Ser: 0.93 mg/dL (ref 0.61–1.24)
GFR calc Af Amer: >60 mL/min (ref >60)
GFR calc non Af Amer: >60 mL/min (ref >60)
GLUCOSE: 112 mg/dL — AB (ref 65–99)
POTASSIUM: 3.6 mmol/L (ref 3.5–5.1)
SODIUM: 138 mmol/L (ref 135–145)
Total Bilirubin: 1.2 mg/dL (ref 0.3–1.2)
Total Protein: 7.2 g/dL (ref 6.5–8.1)

## 2017-08-24 LAB — CBC
HCT: 42.1 % (ref 40.0–52.0)
Hemoglobin: 14.5 g/dL (ref 13.0–18.0)
MCH: 30 pg (ref 26.0–34.0)
MCHC: 34.5 g/dL (ref 32.0–36.0)
MCV: 86.8 fL (ref 80.0–100.0)
PLATELETS: 149 10*3/uL — AB (ref 150–440)
RBC: 4.86 MIL/uL (ref 4.40–5.90)
RDW: 14.8 % — AB (ref 11.5–14.5)
WBC: 7.1 10*3/uL (ref 3.8–10.6)

## 2017-08-24 LAB — TROPONIN I

## 2017-08-24 NOTE — ED Provider Notes (Signed)
[facility] Emergency Department Provider Note   ____________________________________________   First MD Initiated Contact with Patient 08/24/17 0401     (approximate)  I have reviewed the triage vital signs and the nursing notes.   HISTORY  Chief Complaint Chest Pain    HPI Randy Kennedy is a 67 y.o. male who comes into the hospital today with chest pain.  The patient states that it started around 245.  He was awake looking for Valentine's Day gifts on line when he started having some mid chest heaviness.  He reports that it radiated to his shoulder.  The patient took 4 baby aspirin and nitroglycerin.  He reports currently his pain is a 2 out of 10 in intensity.  He had some dizziness and lightheadedness but no shortness of breath no nausea no vomiting no sweats.  The patient has not had chest pain like this in the past.  He was concerned so he decided to come into the hospital for evaluation.   Past Medical History:  Diagnosis Date  . Allergic rhinitis   . Arthritis   . Chest pain    a. 11/2011 reportedly normal stress test performed in [place].  . Chronic pain    L ankle and bilateral hips (remote fracture s/p surgeries), lower back (told has herniated disc)  . Closed avulsion fracture of right talus 06/2016  . Eczema   . Erectile dysfunction 09/14/2012  . Gilbert disease   . Gout 2007  . Hearing loss    otosclerosis  . Heartburn    mild, controlled with pepcid  . History of chicken pox   . 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 HLD (hyperlipidemia)   . HTN (hypertension)    difficult to control - clonidine and beta blockers caused bradycardia  . Morbid obesity ([place])   . 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 OSA (obstructive sleep apnea) 11/2011 sleep study   a. moderate, AHI 37.4 increased to 80 in REM, on BiPAP 14/10, 97% compliance >4 hrs (06/2013); b. Does not tolerate CPAP.  Marland Kitchen RLS (restless legs syndrome)   . 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 Treated with vicodin, prior tried fentanyl and butrans.  . Systolic murmur     Patient Active Problem List   Diagnosis Date Noted  . Nasal congestion 05/19/2017  . Bursitis of right hip 03/31/2017  . Bursitis disorder 01/07/2017  . Morbid obesity ([place]) 01/07/2017  . Encounter for chronic pain management 10/11/2016  . Dizziness 03/24/2016  . Chronic fatigue 03/20/2016  . Narcolepsy   . 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 Bunion 10/22/2015  . Medicare annual wellness visit, subsequent 08/14/2015  . Health maintenance examination 08/14/2015  . Advanced care planning/counseling discussion 08/14/2015  . Thrombocytopenia ([place]) 08/05/2015  . 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 History of ankle fracture 07/19/2013  . Drooping eyelid 03/09/2013  . Leg edema 02/16/2013  . Bradycardia 02/16/2013  . Erectile dysfunction 09/14/2012  . Gout 09/13/2012  . GERD (gastroesophageal reflux disease)   . HTN (hypertension)   . HLD (hyperlipidemia)   . OSA (obstructive sleep apnea)   . RLS (restless legs syndrome)   . Gilbert disease   . Narcotic dependence ([place])   . Cardiac murmur  Past Surgical History:  Procedure Laterality Date  . COLONOSCOPY  02/2011   ext hem, benign polyp, rpt 5 yrs ([place])  . LEG SURGERY  x5   left - after fall at work (3.5 stories)  . MANDIBLE SURGERY  1965   jaw fracture - horse kick  . TYMPANIC MEMBRANE REPAIR Left 1994   otosclerosis    Prior to Admission medications   Medication Sig Start Date End Date Taking? Authorizing Provider  colchicine 0.6 MG tablet Take 1 tablet (0.6 mg total) by mouth daily. 05/07/17   [name], MD  doxazosin (CARDURA) 4 MG tablet Take 1 tablet (4 mg total) by mouth 2 (two) times daily. 07/09/17   Minna Merritts, MD    fluticasone (FLONASE) 50 MCG/ACT nasal spray Place 2 sprays into both nostrils daily. 05/19/17   [name], MD  gabapentin (NEURONTIN) 300 MG capsule Take 1 capsule (300 mg total) by mouth 3 (three) times daily. 08/21/17   [name], MD  HYDROcodone-acetaminophen [facility]) 10-325 MG tablet Take 1 tablet by mouth every 6 (six) hours as needed. 08/19/17   [name], MD  isosorbide mononitrate (IMDUR) 30 MG 24 hr tablet Take 1 tablet (30 mg total) by mouth daily. 07/09/17   Minna Merritts, MD  losartan (COZAAR) 100 MG tablet Take 1 tablet (100 mg total) by mouth daily. 07/16/17   [name], MD  meclizine (ANTIVERT) 25 MG tablet Take 1 tablet (25 mg total) 3 (three) times daily as needed by mouth for dizziness. 06/09/17   Theora Gianotti, NP  nitroGLYCERIN (NITROSTAT) 0.4 MG SL tablet Place 1 tablet (0.4 mg total) under the tongue every 5 (five) minutes as needed for chest pain. 02/16/13   Minna Merritts, MD  pravastatin (PRAVACHOL) 40 MG tablet TAKE 1 TABLET BY MOUTH AT BEDTIME 03/01/17   [name], MD  predniSONE (DELTASONE) 20 MG tablet Take two tablets daily for 5 days followed by one tablet daily for 5 days Patient not taking: Reported on 08/24/2017 07/27/17   [name], MD  probenecid (BENEMID) 500 MG tablet Take 0.5 tablets (250 mg total) by mouth 2 (two) times daily. 05/19/17   [name], MD  vitamin C (ASCORBIC ACID) 500 MG tablet Take 500 mg by mouth daily.    [provider]    Allergies Allopurinol; Lisinopril-hydrochlorothiazide; Procardia [nifedipine]; Spironolactone; Amlodipine; and Uloric [febuxostat]  Family History  Problem Relation Age of Onset  . Cancer Father        prostate  . Cancer Mother        rectal  . Hypertension Mother   . Cancer Paternal Uncle        prostate  . Diabetes Neg Hx   . Stroke Neg Hx   . CAD Neg Hx     Social History Social History   Tobacco Use  . Smoking status: Former  Smoker    Packs/day: 1.00    Years: 4.00    Pack years: 4.00    Types: Pipe    Last attempt to quit: 07/27/1978    Years since quitting: 39.1  . Smokeless tobacco: Never Used  Substance Use Topics  . Alcohol use: No    Alcohol/week: 0.0 oz  . Drug use: No    Review of Systems  Constitutional: No fever/chills Eyes: No visual changes. ENT: No sore throat. Cardiovascular: chest pain. Respiratory: Denies shortness of breath. Gastrointestinal: No abdominal pain.  No nausea, no vomiting.  No diarrhea.  No constipation. Genitourinary:  Negative for dysuria. Musculoskeletal: Negative for back pain. Skin: Negative for rash. Neurological: Dizziness and lightheadedness   ____________________________________________   PHYSICAL EXAM:  VITAL SIGNS: ED Triage Vitals  Enc Vitals Group     BP 08/24/17 0415 (!) 168/90     Pulse Rate 08/24/17 0415 (!) 48     Resp 08/24/17 0415 19     Temp 08/24/17 0415 97.9 F (36.6 C)     Temp Source 08/24/17 0415 Oral     SpO2 08/24/17 0415 95 %     Weight 08/24/17 0343 215 lb (97.5 kg)     Height 08/24/17 0343 5\' 7"  (1.702 m)     Head Circumference --      Peak Flow --      Pain Score 08/24/17 0343 4     Pain Loc --      Pain Edu? --      Excl. in [place]? --     Constitutional: Alert and oriented. Well appearing and in mild distress. Eyes: Conjunctivae are normal. PERRL. EOMI. Head: Atraumatic. Nose: No congestion/rhinnorhea. Mouth/Throat: Mucous membranes are moist.  Oropharynx non-erythematous. Cardiovascular: Normal rate, regular rhythm. Grossly normal heart sounds.  Good peripheral circulation. Respiratory: Normal respiratory effort.  No retractions. Lungs CTAB. Gastrointestinal: Soft and nontender. No distention.  Musculoskeletal: No lower extremity tenderness nor edema.   Neurologic:  Normal speech and language. . Skin:  Skin is warm, dry and intact.  Psychiatric: Mood and affect are normal.    ____________________________________________   LABS (all labs ordered are listed, but only abnormal results are displayed)  Labs Reviewed  CBC - Abnormal; Notable for the following components:      Result Value   RDW 14.8 (*)    Platelets 149 (*)    All other components within normal limits  COMPREHENSIVE METABOLIC PANEL - Abnormal; Notable for the following components:   Glucose, Bld 112 (*)    All other components within normal limits  TROPONIN I  TROPONIN I   ____________________________________________  EKG  ED ECG REPORT I, Loney Hering, the attending physician, personally viewed and interpreted this ECG.   Date: 08/24/2017  EKG Time: 349  Rate: 52  Rhythm: normal sinus rhythm, RBBB  Axis: normal  Intervals:right bundle branch block  ST&T Change: Flipped T waves in lead III, aVF, V2, V3, V4 similar to previous EKG from 06/2017  ____________________________________________  RADIOLOGY  CXR: No acute cardiopulmonary disease  ____________________________________________   PROCEDURES  Procedure(s) performed: None  Procedures  Critical Care performed: No  ____________________________________________   INITIAL IMPRESSION / ASSESSMENT AND PLAN / ED COURSE  As part of my medical decision making, I reviewed the following data within the electronic MEDICAL RECORD NUMBER Notes from prior ED visits and [place] Controlled Substance Database   This is a 67 year old male who comes into the hospital today with chest pain.  The patient did take some nitro at home and the chest pain has improved.  My differential diagnosis includes acute coronary syndrome, unstable angina, pneumonia, pneumothorax  The patient had some blood work drawn which was unremarkable.  He also had a chest x-ray which did not show any acute pulmonary disease.  The patient has already received some aspirin and nitroglycerin at home and his pain is improved.  I will repeat the patient's troponin  after 3 hours and he will be reassessed.     The patient's care will be signed out to Dr. Jimmye Norman who will follow up the results of  the repeat troponin.  ____________________________________________   FINAL CLINICAL IMPRESSION(S) / ED DIAGNOSES  Final diagnoses:  Chest pain, unspecified type     ED Discharge Orders    None       Note:  This document was prepared using Dragon voice recognition software and may include unintentional dictation errors.    Loney Hering, MD 08/24/17 [phone]

## 2017-08-24 NOTE — ED Provider Notes (Signed)
Repeat troponin was negative.  According to Dr. Pollyann Samples plan we will proceed with discharge.   Earleen Newport, MD 08/24/17 [phone]

## 2017-08-24 NOTE — Progress Notes (Signed)
Cardiology Office Note  Date:  08/25/2017   ID:  Randy Kennedy, DOB 1950-08-08, MRN [id number]  PCP:  [name], MD   Chief Complaint  Patient presents with  . Other    ED follow up for chest pain. Patient denies chest pain at this time. Meds reviewed verbally with patient.    HPI:  Randy Kennedy is a  pleasant 67 year old gentleman with a history of  RLS,  chronic burning in his feet/neuropathy,  hypertension, medication intolerances hyperlipidemia,  OSA  ,not been using his bipap/cpap,  Episodes of atypical chest tightness.  Who presents for previous history of bradycardia,  Hypertension  chest pain  Reports that he was in the ER yesterday for symptoms of chest pain Hospital records reviewed with the patient in detail Was online when he developed mid chest heaviness, presenting at rest.   it radiated to his shoulder.  The patient took 4 baby aspirin and nitroglycerin.  Resolved without intervention, workup in the emergency room was negative Does not have chest pain on exertion  Echo 06/11/2017 Normal EF, elevated RVSP  Long discussion concerning medication intolerances  Some medication confusion on what he is taking today Headaches, imdur,  Stopped cardura, 4 mg twice daily may have caused a rash Chlorthalidone causes gout Ca channel blocker: Amlodipine and Procardia leg swelling Clonidine : bradycardia  Often misses hydralazine in the middle of the day Sleeping in the chair Not using CPAP.   Has chronic sinus issues, headaches when he uses CPAP  Not sleeping well  Long history of bradycardia, still asymptomatic no regular exercise  Lab work reviewed total chol 165, LDL 77  Chronic legs and arm numbness and tingling.   Wife has to drive all the time secondary to fatigue  EKG on today's visit shows sinus bradycardia with rate 53 bpm, right bundle branch block , no significant ST or T-wave changes    other past medical history  Reports that he had a  negative stress test in 2013. This was a pharmacologic Myoview dated May 2013. He was unable to treadmill He was seen previously by cardiology in [place]. Notes indicate history of difficult to control blood pressure, hyperlipidemia, obesity, disabled. No smoking history He was started several years on Procardia XL 30 mg daily. This was increased in [place]  Previous episode of chest tightness while he was swimming in a pool. He had to hold onto the side of the pole. Mild dizziness.   Notes from prior cardiologist indicate previous echocardiogram showing diastolic dysfunction  Grandfather died of a heart attack at 17, father doing well in his 61s as well as his mother PMH:   has a past medical history of Allergic rhinitis, Arthritis, Chest pain, Chronic pain, Closed avulsion fracture of right talus (06/2016), Eczema, Erectile dysfunction (09/14/2012), [name] disease, Gout (2007), Hearing loss, Heartburn, History of chicken pox, History of hepatitis B, HLD (hyperlipidemia), HTN (hypertension), Morbid obesity ([place]), Narcolepsy (2006), OSA (obstructive sleep apnea) (11/2011 sleep study), RLS (restless legs syndrome), RSD lower limb (03/04/9832), and Systolic murmur.  PSH:    Past Surgical History:  Procedure Laterality Date  . COLONOSCOPY  02/2011   ext hem, benign polyp, rpt 5 yrs ([place])  . LEG SURGERY  x5   left - after fall at work (3.5 stories)  . MANDIBLE SURGERY  1965   jaw fracture - horse kick  . TYMPANIC MEMBRANE REPAIR Left 1994   otosclerosis    Current Outpatient Medications  Medication Sig Dispense Refill  .  colchicine 0.6 MG tablet Take 1 tablet (0.6 mg total) by mouth daily.    . fluticasone (FLONASE) 50 MCG/ACT nasal spray Place 2 sprays into both nostrils daily. 16 g 6  . gabapentin (NEURONTIN) 300 MG capsule Take 1 capsule (300 mg total) by mouth 3 (three) times daily. 270 capsule 1  . hydrALAZINE (APRESOLINE) 100 MG tablet Take 100 mg by mouth 3 (three) times daily.    Marland Kitchen  HYDROcodone-acetaminophen (NORCO) 10-325 MG tablet Take 1 tablet by mouth every 6 (six) hours as needed. 90 tablet 0  . losartan (COZAAR) 100 MG tablet Take 1 tablet (100 mg total) by mouth daily. 90 tablet 3  . meclizine (ANTIVERT) 25 MG tablet Take 1 tablet (25 mg total) 3 (three) times daily as needed by mouth for dizziness. 30 tablet 3  . nitroGLYCERIN (NITROSTAT) 0.4 MG SL tablet Place 1 tablet (0.4 mg total) under the tongue every 5 (five) minutes as needed for chest pain. 25 tablet 3  . pravastatin (PRAVACHOL) 40 MG tablet TAKE 1 TABLET BY MOUTH AT BEDTIME 90 tablet 3  . probenecid (BENEMID) 500 MG tablet Take 0.5 tablets (250 mg total) by mouth 2 (two) times daily. 30 tablet 6  . vitamin C (ASCORBIC ACID) 500 MG tablet Take 500 mg by mouth daily.     No current facility-administered medications for this visit.      Allergies:   Allopurinol; Lisinopril-hydrochlorothiazide; Procardia [nifedipine]; Spironolactone; Amlodipine; and Uloric [febuxostat]   Social History:  The patient  reports that he quit smoking about 39 years ago. His smoking use included pipe. He has a 4.00 pack-year smoking history. he has never used smokeless tobacco. He reports that he does not drink alcohol or use drugs.   Family History:   family history includes Cancer in his father, mother, and paternal uncle; Hypertension in his mother.    Review of Systems: Review of Systems  Constitutional: Positive for malaise/fatigue.  HENT: Positive for congestion.   Respiratory: Negative.   Cardiovascular: Positive for chest pain.  Gastrointestinal: Negative.   Musculoskeletal: Negative.   Neurological: Positive for headaches.       Numbness in his legs and arms, tingling  Psychiatric/Behavioral: Negative.   All other systems reviewed and are negative.    PHYSICAL EXAM: VS:  BP (!) 154/82 (BP Location: Left Arm, Patient Position: Sitting, Cuff Size: Normal)   Pulse (!) 53   Ht 5\' 7"  (1.702 m)   Wt 224 lb 12 oz  (101.9 kg)   BMI 35.20 kg/m  , BMI Body mass index is 35.2 kg/m. GEN: Well nourished, well developed, in no acute distress , obese HEENT: normal  Neck: no JVD, carotid bruits, or masses Cardiac: RRR; no murmurs, rubs, or gallops,no edema  Respiratory:  clear to auscultation bilaterally, normal work of breathing GI: soft, nontender, nondistended, + BS MS: no deformity or atrophy  Skin: warm and dry, no rash Neuro:  Strength and sensation are intact Psych: euthymic mood, full affect    Recent Labs: 08/24/2017: ALT 20; BUN 16; Creatinine, Ser 0.93; Hemoglobin 14.5; Platelets 149; Potassium 3.6; Sodium 138    Lipid Panel Lab Results  Component Value Date   CHOL 198 01/04/2017   HDL 51.70 01/04/2017   LDLCALC 77 08/07/2015   TRIG 334.0 (H) 01/04/2017      Wt Readings from Last 3 Encounters:  08/25/17 224 lb 12 oz (101.9 kg)  08/24/17 215 lb (97.5 kg)  07/09/17 228 lb 8 oz (103.6 kg)  ASSESSMENT AND PLAN:  Essential hypertension - Plan: EKG 12-Lead Numerous medication intolerances Recommend he stay with Cardura 2 mg twice daily, hydralazine 100 3 times daily, losartan 100 daily  RLS (restless legs syndrome) - Plan: EKG 12-Lead Previously managed with gabapentin, has chronic fatigue Unable to wear his CPAP  Chronic pain - Plan: EKG 12-Lead on gabapentin, Managed by primary care  Bradycardia - Plan: EKG 12-Lead  asymptomatic, no orthostasis symptoms.   We will try to avoid clonidine, beta-blockers, diltiazem/verapamil  OSA (obstructive sleep apnea) - Currently not using his CPAP/BIPAP, likely contributing to his fatigue and insomnia, poor sleep hygiene.  Unable to tolerate secondary to sinus problems  Fatigue Likely multifactorial. deconditioned at baseline, poor sleep/apnea  Atypical chest pain Prior history, presents at rest No further workup at this time If he has additional episodes we could repeat stress testing   Total encounter time more than 45  minutes  Greater than 50% was spent in counseling and coordination of care with the patient  Disposition:   F/U  12 months   Orders Placed This Encounter  Procedures  . EKG 12-Lead     Signed, Esmond Plants, M.D., Ph.D. 08/25/2017  [place], [place]

## 2017-08-24 NOTE — ED Triage Notes (Addendum)
Patient ambulatory to triage with steady gait, without difficulty or distress noted; pt reports mid upper CP radiating into left side x hr; denies any accomp symptoms; 218/100 BP at home; denies hx of same; pt took 4-81mg  ASA and 1 SL NTG at home PTA

## 2017-08-24 NOTE — Discharge Instructions (Signed)
Please follow-up with your cardiologist for further evaluation of your chest pain ?

## 2017-08-25 ENCOUNTER — Encounter: Payer: Self-pay | Admitting: Cardiovascular Disease

## 2017-08-25 ENCOUNTER — Ambulatory Visit: Payer: PPO | Admitting: Cardiovascular Disease

## 2017-08-25 DIAGNOSIS — G2581 Restless legs syndrome: Secondary | ICD-10-CM

## 2017-08-25 DIAGNOSIS — I10 Essential (primary) hypertension: Secondary | ICD-10-CM | POA: Diagnosis not present

## 2017-08-25 DIAGNOSIS — G4733 Obstructive sleep apnea (adult) (pediatric): Secondary | ICD-10-CM

## 2017-08-25 DIAGNOSIS — E782 Mixed hyperlipidemia: Secondary | ICD-10-CM | POA: Diagnosis not present

## 2017-08-25 DIAGNOSIS — R5382 Chronic fatigue, unspecified: Secondary | ICD-10-CM

## 2017-08-25 DIAGNOSIS — G8929 Other chronic pain: Secondary | ICD-10-CM

## 2017-08-25 MED ORDER — DOXAZOSIN MESYLATE 2 MG PO TABS: 2.0000 mg | ORAL_TABLET | Freq: Two times a day (BID) | ORAL | 11 refills | Status: DC

## 2017-08-25 NOTE — Patient Instructions (Addendum)
Medication Instructions:   Consider restart starting cardura 2 mg twice a day Stay on losartan one a day Stay on hydralazine 100 mg three times a day  Labwork:  No new labs needed  Testing/Procedures:  No further testing at this time   Follow-Up: It was a pleasure seeing you in the office today. Please call us if you have new issues that need to be addressed before your next appt.  [phone]  Your physician wants you to follow-up in: 12 months.  You will receive a reminder letter in the mail two months in advance. If you don't receive a letter, please call our office to schedule the follow-up appointment.  If you need a refill on your cardiac medications before your next appointment, please call your pharmacy.

## 2017-09-06 ENCOUNTER — Encounter: Payer: Self-pay | Admitting: Family Medicine

## 2017-09-07 DIAGNOSIS — M19172 Post-traumatic osteoarthritis, left ankle and foot: Secondary | ICD-10-CM | POA: Diagnosis not present

## 2017-09-07 DIAGNOSIS — M24572 Contracture, left ankle: Secondary | ICD-10-CM | POA: Diagnosis not present

## 2017-09-10 NOTE — Telephone Encounter (Signed)
[employer] letter written and in Lisa's box.  

## 2017-09-10 NOTE — Telephone Encounter (Signed)
Letter placed at front office.

## 2017-09-12 ENCOUNTER — Encounter: Payer: Self-pay | Admitting: Cardiovascular Disease

## 2017-09-19 ENCOUNTER — Other Ambulatory Visit: Payer: Self-pay | Admitting: Family Medicine

## 2017-09-20 ENCOUNTER — Telehealth: Payer: Self-pay

## 2017-09-20 IMAGING — DX DG ANKLE COMPLETE 3+V*R*
3 series · 3 of 3 positions shown · non-contrast
Comparison: None available for comparison at time of study
interpretation.

CLINICAL DATA: RIGHT ankle injury June 07, 2016. Continued
pain.

EXAM:
RIGHT ANKLE - COMPLETE 3+ VIEW

[ankle ap]
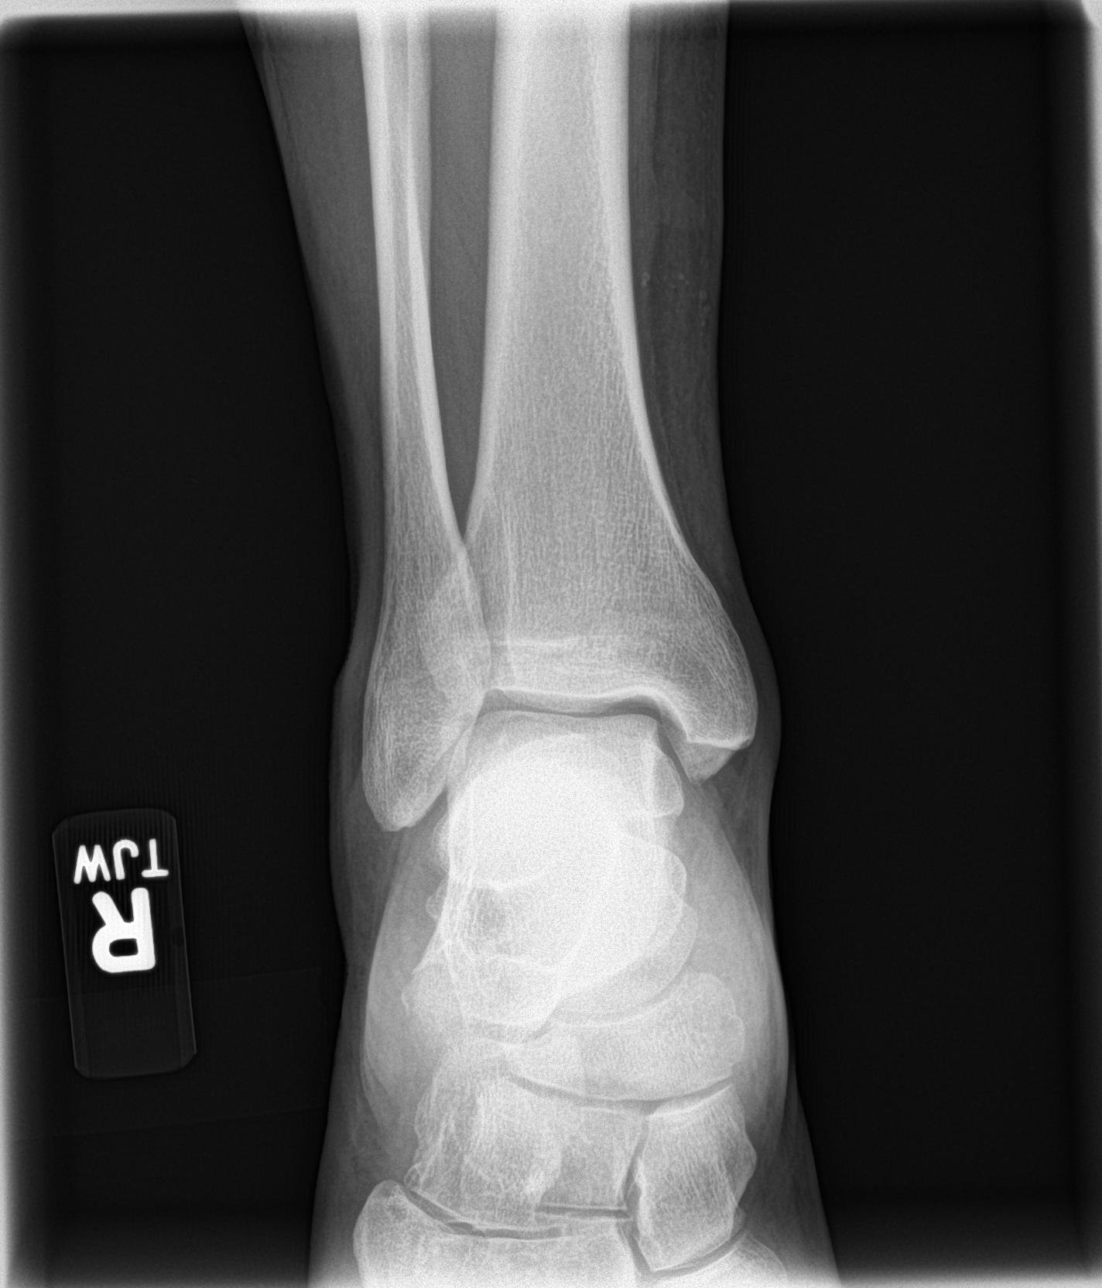

[ankle obl]
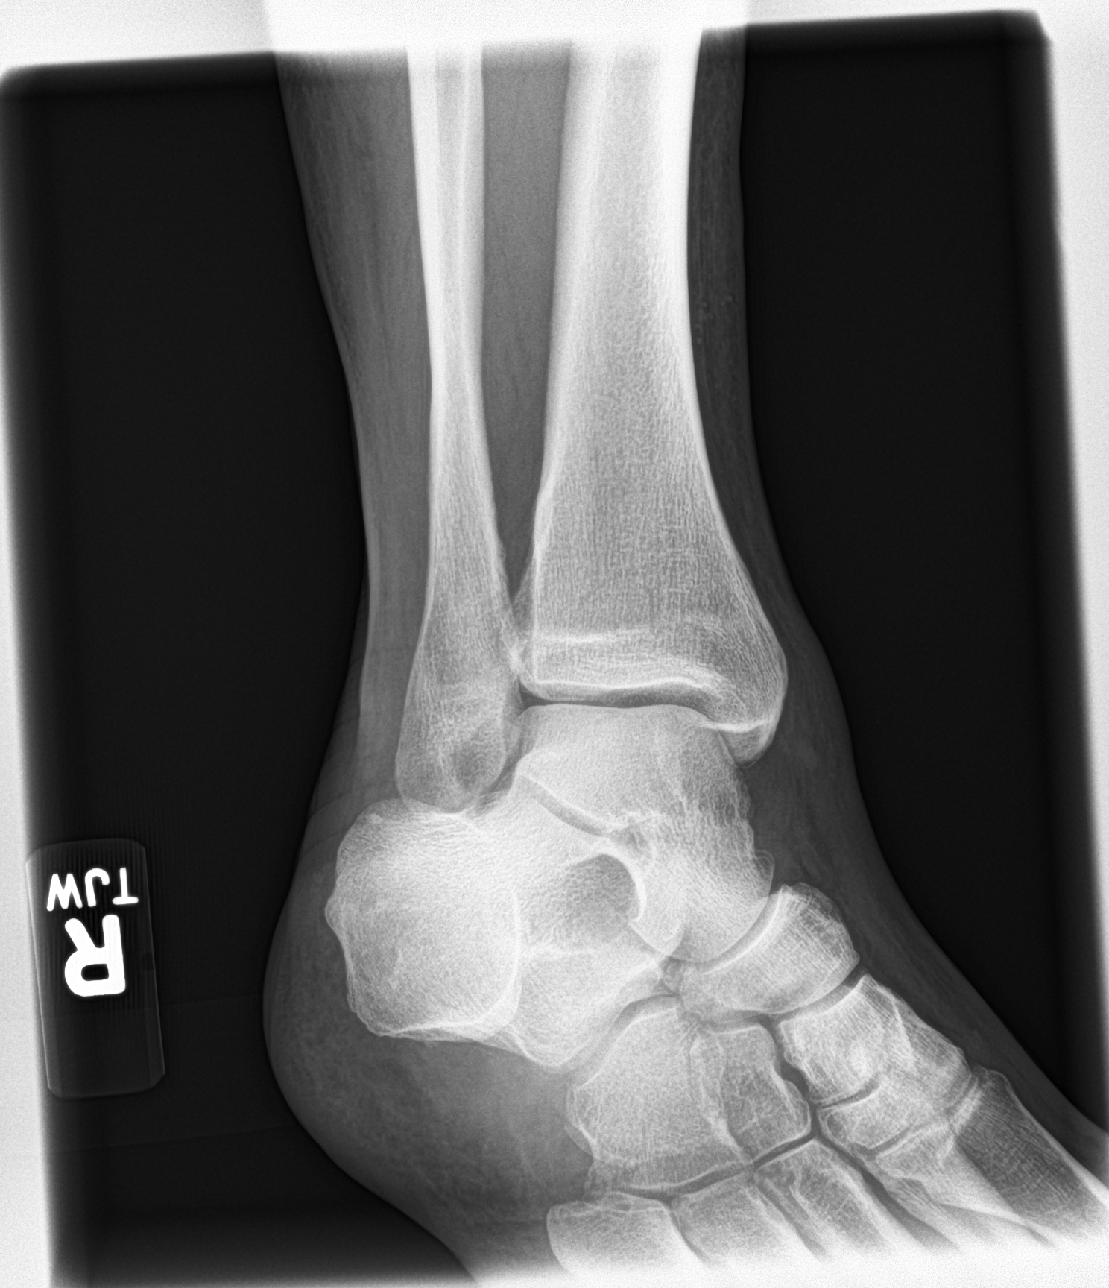

[ankle lat]
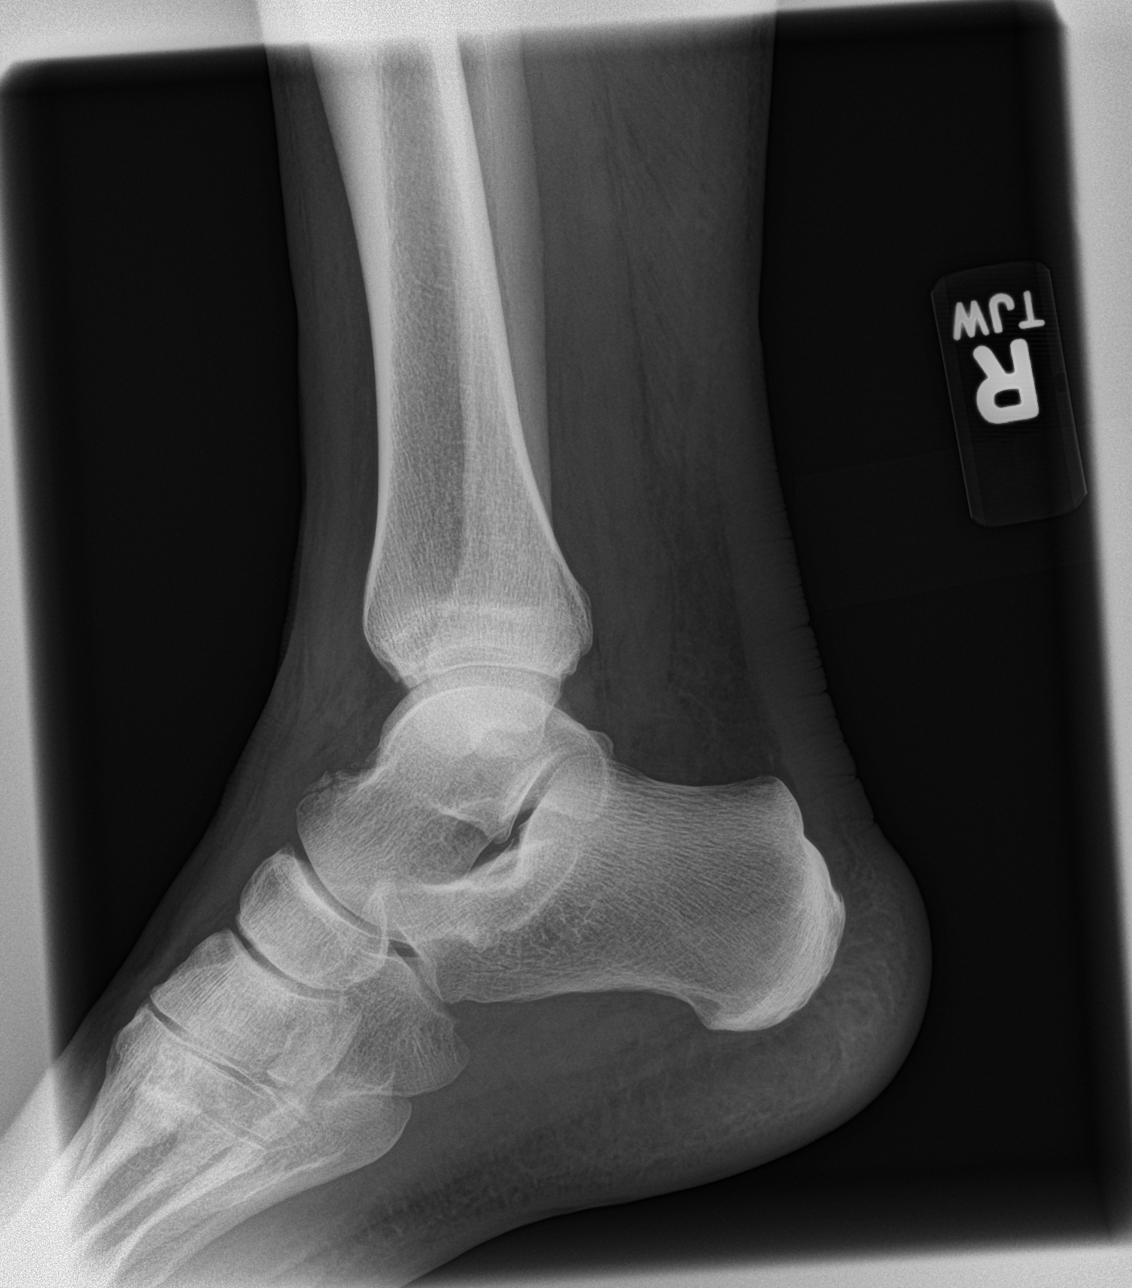

[3 of 3 positions shown; findings below may reference images not displayed]

FINDINGS: No fracture deformity nor dislocation. Tiny corticated bony fragment
contiguous with the anterior superior talus. The ankle mortise
appears congruent and the tibiofibular syndesmosis intact. No
destructive bony lesions. Minimal lateral ankle soft tissue swelling
without subcutaneous gas or radiopaque foreign bodies.
IMPRESSION: Age indeterminate avulsion fracture anterior superior talus.
Recommend correlation with point tenderness. No dislocation.

## 2017-09-20 NOTE — Telephone Encounter (Signed)
Left message to schedule appt with Dr. Rockey Situ to discuss BP

## 2017-09-20 NOTE — Telephone Encounter (Signed)
Last rx:  08/19/17, #90 Last OV:  05/19/17 Next OV:  none

## 2017-09-21 MED ORDER — HYDROCODONE-ACETAMINOPHEN 10-325 MG PO TABS: 1.0000 | ORAL_TABLET | Freq: Four times a day (QID) | ORAL | 0 refills | Status: DC | PRN

## 2017-09-21 NOTE — Telephone Encounter (Signed)
Eprescribed.

## 2017-09-22 NOTE — Telephone Encounter (Signed)
lmov to schedule  °

## 2017-09-28 NOTE — Telephone Encounter (Signed)
lmov to schedule appt to discuss bp issues

## 2017-09-29 NOTE — Telephone Encounter (Signed)
Scheduled 3/12

## 2017-10-02 ENCOUNTER — Encounter: Payer: Self-pay | Admitting: Family Medicine

## 2017-10-03 NOTE — Progress Notes (Deleted)
Cardiology Office Note  Date:  10/03/2017   ID:  Randy Kennedy, DOB 1951/03/31, MRN [id number]  PCP:  [name], MD   No chief complaint on file.   HPI:  Randy Kennedy is a  pleasant 67 year old gentleman with a history of  RLS,  chronic burning in his feet/neuropathy,  hypertension, medication intolerances hyperlipidemia,  OSA  ,not been using his bipap/cpap,  Episodes of atypical chest tightness.  Who presents for previous history of bradycardia,  Hypertension  chest pain  Reports that he was in the ER yesterday for symptoms of chest pain Hospital records reviewed with the patient in detail Was online when he developed mid chest heaviness, presenting at rest.   it radiated to his shoulder.  The patient took 4 baby aspirin and nitroglycerin.  Resolved without intervention, workup in the emergency room was negative Does not have chest pain on exertion  Echo 06/11/2017 Normal EF, elevated RVSP  Long discussion concerning medication intolerances  Some medication confusion on what he is taking today Headaches, imdur,  Stopped cardura, 4 mg twice daily may have caused a rash Chlorthalidone causes gout Ca channel blocker: Amlodipine and Procardia leg swelling Clonidine : bradycardia  Often misses hydralazine in the middle of the day Sleeping in the chair Not using CPAP.   Has chronic sinus issues, headaches when he uses CPAP  Not sleeping well  Long history of bradycardia, still asymptomatic no regular exercise  Lab work reviewed total chol 165, LDL 77  Chronic legs and arm numbness and tingling.   Wife has to drive all the time secondary to fatigue  EKG on today's visit shows sinus bradycardia with rate 53 bpm, right bundle branch block , no significant ST or T-wave changes    other past medical history  Reports that he had a negative stress test in 2013. This was a pharmacologic Myoview dated May 2013. He was unable to treadmill He was seen previously by  cardiology in [place]. Notes indicate history of difficult to control blood pressure, hyperlipidemia, obesity, disabled. No smoking history He was started several years on Procardia XL 30 mg daily. This was increased in [place]  Previous episode of chest tightness while he was swimming in a pool. He had to hold onto the side of the pole. Mild dizziness.   Notes from prior cardiologist indicate previous echocardiogram showing diastolic dysfunction  Grandfather died of a heart attack at 71, father doing well in his 82s as well as his mother PMH:   has a past medical history of Allergic rhinitis, Arthritis, Chest pain, Chronic pain, Closed avulsion fracture of right talus (06/2016), Eczema, Erectile dysfunction (09/14/2012), [name] disease, Gout (2007), Hearing loss, Heartburn, History of chicken pox, History of hepatitis B, HLD (hyperlipidemia), HTN (hypertension), Morbid obesity ([place]), Narcolepsy (2006), OSA (obstructive sleep apnea) (11/2011 sleep study), RLS (restless legs syndrome), RSD lower limb (6/43/3295), and Systolic murmur.  PSH:    Past Surgical History:  Procedure Laterality Date  . COLONOSCOPY  02/2011   ext hem, benign polyp, rpt 5 yrs ([place])  . LEG SURGERY  x5   left - after fall at work (3.5 stories)  . MANDIBLE SURGERY  1965   jaw fracture - horse kick  . TYMPANIC MEMBRANE REPAIR Left 1994   otosclerosis    Current Outpatient Medications  Medication Sig Dispense Refill  . colchicine 0.6 MG tablet Take 1 tablet (0.6 mg total) by mouth daily.    Marland Kitchen doxazosin (CARDURA) 2 MG tablet Take 1 tablet (2  mg total) by mouth 2 (two) times daily. 60 tablet 11  . fluticasone (FLONASE) 50 MCG/ACT nasal spray Place 2 sprays into both nostrils daily. 16 g 6  . gabapentin (NEURONTIN) 300 MG capsule Take 1 capsule (300 mg total) by mouth 3 (three) times daily. 270 capsule 1  . hydrALAZINE (APRESOLINE) 100 MG tablet Take 100 mg by mouth 3 (three) times daily.    Marland Kitchen HYDROcodone-acetaminophen  (NORCO) 10-325 MG tablet Take 1 tablet by mouth every 6 (six) hours as needed. 90 tablet 0  . losartan (COZAAR) 100 MG tablet Take 1 tablet (100 mg total) by mouth daily. 90 tablet 3  . meclizine (ANTIVERT) 25 MG tablet Take 1 tablet (25 mg total) 3 (three) times daily as needed by mouth for dizziness. 30 tablet 3  . nitroGLYCERIN (NITROSTAT) 0.4 MG SL tablet Place 1 tablet (0.4 mg total) under the tongue every 5 (five) minutes as needed for chest pain. 25 tablet 3  . pravastatin (PRAVACHOL) 40 MG tablet TAKE 1 TABLET BY MOUTH AT BEDTIME 90 tablet 3  . probenecid (BENEMID) 500 MG tablet Take 0.5 tablets (250 mg total) by mouth 2 (two) times daily. 30 tablet 6  . vitamin C (ASCORBIC ACID) 500 MG tablet Take 500 mg by mouth daily.     No current facility-administered medications for this visit.      Allergies:   Allopurinol; Lisinopril-hydrochlorothiazide; Procardia [nifedipine]; Spironolactone; Amlodipine; and Uloric [febuxostat]   Social History:  The patient  reports that he quit smoking about 39 years ago. His smoking use included pipe. He has a 4.00 pack-year smoking history. he has never used smokeless tobacco. He reports that he does not drink alcohol or use drugs.   Family History:   family history includes Cancer in his father, mother, and paternal uncle; Hypertension in his mother.    Review of Systems: Review of Systems  Constitutional: Positive for malaise/fatigue.  HENT: Positive for congestion.   Respiratory: Negative.   Cardiovascular: Positive for chest pain.  Gastrointestinal: Negative.   Musculoskeletal: Negative.   Neurological: Positive for headaches.       Numbness in his legs and arms, tingling  Psychiatric/Behavioral: Negative.   All other systems reviewed and are negative.    PHYSICAL EXAM: VS:  There were no vitals taken for this visit. , BMI There is no height or weight on file to calculate BMI. GEN: Well nourished, well developed, in no acute distress ,  obese HEENT: normal  Neck: no JVD, carotid bruits, or masses Cardiac: RRR; no murmurs, rubs, or gallops,no edema  Respiratory:  clear to auscultation bilaterally, normal work of breathing GI: soft, nontender, nondistended, + BS MS: no deformity or atrophy  Skin: warm and dry, no rash Neuro:  Strength and sensation are intact Psych: euthymic mood, full affect    Recent Labs: 08/24/2017: ALT 20; BUN 16; Creatinine, Ser 0.93; Hemoglobin 14.5; Platelets 149; Potassium 3.6; Sodium 138    Lipid Panel Lab Results  Component Value Date   CHOL 198 01/04/2017   HDL 51.70 01/04/2017   LDLCALC 77 08/07/2015   TRIG 334.0 (H) 01/04/2017      Wt Readings from Last 3 Encounters:  08/25/17 224 lb 12 oz (101.9 kg)  08/24/17 215 lb (97.5 kg)  07/09/17 228 lb 8 oz (103.6 kg)       ASSESSMENT AND PLAN:  Essential hypertension - Plan: EKG 12-Lead Numerous medication intolerances Recommend he stay with Cardura 2 mg twice daily, hydralazine 100 3 times daily,  losartan 100 daily  RLS (restless legs syndrome) - Plan: EKG 12-Lead Previously managed with gabapentin, has chronic fatigue Unable to wear his CPAP  Chronic pain - Plan: EKG 12-Lead on gabapentin, Managed by primary care  Bradycardia - Plan: EKG 12-Lead  asymptomatic, no orthostasis symptoms.   We will try to avoid clonidine, beta-blockers, diltiazem/verapamil  OSA (obstructive sleep apnea) - Currently not using his CPAP/BIPAP, likely contributing to his fatigue and insomnia, poor sleep hygiene.  Unable to tolerate secondary to sinus problems  Fatigue Likely multifactorial. deconditioned at baseline, poor sleep/apnea  Atypical chest pain Prior history, presents at rest No further workup at this time If he has additional episodes we could repeat stress testing   Total encounter time more than 45 minutes  Greater than 50% was spent in counseling and coordination of care with the patient  Disposition:   F/U  12  months   No orders of the defined types were placed in this encounter.    Signed, Esmond Plants, M.D., Ph.D. 10/03/2017  [place], [place]

## 2017-10-04 ENCOUNTER — Emergency Department
Admission: EM | Admit: 2017-10-04 | Discharge: 2017-10-04 | Disposition: A | Discharge status: Home / Self Care | Payer: PPO | Attending: Emergency Medicine | Admitting: Emergency Medicine

## 2017-10-04 ENCOUNTER — Other Ambulatory Visit: Payer: Self-pay

## 2017-10-04 ENCOUNTER — Emergency Department: Payer: PPO

## 2017-10-04 DIAGNOSIS — R2241 Localized swelling, mass and lump, right lower limb: Secondary | ICD-10-CM | POA: Insufficient documentation

## 2017-10-04 DIAGNOSIS — M7989 Other specified soft tissue disorders: Secondary | ICD-10-CM | POA: Diagnosis not present

## 2017-10-04 DIAGNOSIS — M25551 Pain in right hip: Secondary | ICD-10-CM | POA: Diagnosis not present

## 2017-10-04 DIAGNOSIS — I10 Essential (primary) hypertension: Secondary | ICD-10-CM | POA: Diagnosis not present

## 2017-10-04 DIAGNOSIS — Z79899 Other long term (current) drug therapy: Secondary | ICD-10-CM | POA: Diagnosis not present

## 2017-10-04 DIAGNOSIS — M79661 Pain in right lower leg: Secondary | ICD-10-CM | POA: Diagnosis not present

## 2017-10-04 DIAGNOSIS — Z87891 Personal history of nicotine dependence: Secondary | ICD-10-CM | POA: Insufficient documentation

## 2017-10-04 DIAGNOSIS — L03115 Cellulitis of right lower limb: Secondary | ICD-10-CM | POA: Diagnosis not present

## 2017-10-04 DIAGNOSIS — M25561 Pain in right knee: Secondary | ICD-10-CM | POA: Diagnosis present

## 2017-10-04 LAB — CBC WITH DIFFERENTIAL/PLATELET
Basophils Absolute: 0.1 10*3/uL (ref 0–0.1)
Basophils Relative: 1 %
Eosinophils Absolute: 0.1 10*3/uL (ref 0–0.7)
Eosinophils Relative: 1 %
HEMATOCRIT: 41.4 % (ref 40.0–52.0)
Hemoglobin: 14.1 g/dL (ref 13.0–18.0)
LYMPHS PCT: 14 %
Lymphs Abs: 1.5 10*3/uL (ref 1.0–3.6)
MCH: 30.1 pg (ref 26.0–34.0)
MCHC: 34.1 g/dL (ref 32.0–36.0)
MCV: 88.4 fL (ref 80.0–100.0)
MONO ABS: 1.1 10*3/uL — AB (ref 0.2–1.0)
MONOS PCT: 11 %
NEUTROS ABS: 7.9 10*3/uL — AB (ref 1.4–6.5)
NEUTROS PCT: 73 %
Platelets: 158 10*3/uL (ref 150–440)
RBC: 4.68 MIL/uL (ref 4.40–5.90)
RDW: 13.8 % (ref 11.5–14.5)
WBC: 10.8 10*3/uL — ABNORMAL HIGH (ref 3.8–10.6)

## 2017-10-04 LAB — COMPREHENSIVE METABOLIC PANEL
ALBUMIN: 4.3 g/dL (ref 3.5–5.0)
ALK PHOS: 65 U/L (ref 38–126)
ALT: 18 U/L (ref 17–63)
AST: 26 U/L (ref 15–41)
Anion gap: 11 (ref 5–15)
BILIRUBIN TOTAL: 1.3 mg/dL — AB (ref 0.3–1.2)
BUN: 16 mg/dL (ref 6–20)
CALCIUM: 9 mg/dL (ref 8.9–10.3)
CO2: 28 mmol/L (ref 22–32)
Chloride: 98 mmol/L — ABNORMAL LOW (ref 101–111)
Creatinine, Ser: 1.13 mg/dL (ref 0.61–1.24)
GFR calc Af Amer: >60 mL/min (ref >60)
GLUCOSE: 116 mg/dL — AB (ref 65–99)
POTASSIUM: 4.3 mmol/L (ref 3.5–5.1)
Sodium: 137 mmol/L (ref 135–145)
TOTAL PROTEIN: 8 g/dL (ref 6.5–8.1)

## 2017-10-04 MED ORDER — CLINDAMYCIN HCL 150 MG PO CAPS: 300.0000 mg | ORAL_CAPSULE | Freq: Three times a day (TID) | ORAL | 0 refills | Status: DC

## 2017-10-04 MED ORDER — LOSARTAN POTASSIUM 50 MG PO TABS
100.0000 mg | ORAL_TABLET | Freq: Every day | ORAL | Status: DC
  Administered 2017-10-04: 100 mg via ORAL
  Filled 2017-10-04: qty 2

## 2017-10-04 MED ORDER — OXYCODONE-ACETAMINOPHEN 7.5-325 MG PO TABS: 1.0000 | ORAL_TABLET | Freq: Four times a day (QID) | ORAL | 0 refills | Status: DC | PRN

## 2017-10-04 MED ORDER — OXYCODONE-ACETAMINOPHEN 7.5-325 MG PO TABS
1.0000 | ORAL_TABLET | Freq: Once | ORAL | Status: AC
  Administered 2017-10-04: 1 via ORAL
  Filled 2017-10-04: qty 1

## 2017-10-04 MED ORDER — HYDRALAZINE HCL 50 MG PO TABS
100.0000 mg | ORAL_TABLET | Freq: Once | ORAL | Status: AC
  Administered 2017-10-04: 100 mg via ORAL
  Filled 2017-10-04: qty 2

## 2017-10-04 MED ORDER — OXYCODONE-ACETAMINOPHEN 5-325 MG PO TABS
ORAL_TABLET | ORAL | Status: AC
  Filled 2017-10-04: qty 1

## 2017-10-04 MED ORDER — OXYCODONE-ACETAMINOPHEN 5-325 MG PO TABS
1.0000 | ORAL_TABLET | ORAL | Status: DC | PRN
  Administered 2017-10-04: 1 via ORAL

## 2017-10-04 MED ORDER — CLINDAMYCIN PHOSPHATE 600 MG/50ML IV SOLN
600.0000 mg | Freq: Once | INTRAVENOUS | Status: AC
  Administered 2017-10-04: 600 mg via INTRAVENOUS
  Filled 2017-10-04: qty 50

## 2017-10-04 NOTE — ED Notes (Signed)
See triage note  Presents with pain and swelling to right leg  Pain starts behind right knee and moves to hip  Denies any injury  Lower leg red and swollen

## 2017-10-04 NOTE — ED Provider Notes (Signed)
[facility] Emergency Department Provider Note   ____________________________________________   First MD Initiated Contact with Patient 10/04/17 [phone]     (approximate)  I have reviewed the triage vital signs and the nursing notes.   HISTORY  Chief Complaint Knee Pain and Hip Pain   HPI Randy Kennedy is a 67 y.o. male is here with complaint of right knee, leg and hip pain times 4 days.  Patient is unaware of any fever or chills.  Pain is increased with walking.  He denies any difficulty with shortness of breath or chest pain.  Patient denies any type of injury.  He denies recent traveling or history of DVT.  He rates his pain as 9/10.   Past Medical History:  Diagnosis Date  . Allergic rhinitis   . Arthritis   . Chest pain    a. 11/2011 reportedly normal stress test performed in [place].  . Chronic pain    L ankle and bilateral hips (remote fracture s/p surgeries), lower back (told has herniated disc)  . Closed avulsion fracture of right talus 06/2016  . Eczema   . Erectile dysfunction 09/14/2012  . Gilbert disease   . Gout 2007  . Hearing loss    otosclerosis  . Heartburn    mild, controlled with pepcid  . History of chicken pox   . 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 HLD (hyperlipidemia)   . HTN (hypertension)    difficult to control - clonidine and beta blockers caused bradycardia  . Morbid obesity ([place])   . 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 OSA (obstructive sleep apnea) 11/2011 sleep study   a. moderate, AHI 37.4 increased to 80 in REM, on BiPAP 14/10, 97% compliance >4 hrs (06/2013); b. Does not tolerate CPAP.  Marland Kitchen RLS (restless legs syndrome)   . 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   Reviewed prior workup (saw pain management at [place]): chronic back pain with RLS + RSD L leg with severe L post-traumatic ankle joint arthosis, no mention of peripheral neuropathy. Treated with vicodin, prior tried fentanyl  and butrans.  . Systolic murmur     Patient Active Problem List   Diagnosis Date Noted  . Nasal congestion 05/19/2017  . Bursitis of right hip 03/31/2017  . Bursitis disorder 01/07/2017  . Morbid obesity ([place]) 01/07/2017  . Encounter for chronic pain management 10/11/2016  . Dizziness 03/24/2016  . Chronic fatigue 03/20/2016  . Narcolepsy   . 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 Bunion 10/22/2015  . Medicare annual wellness visit, subsequent 08/14/2015  . Health maintenance examination 08/14/2015  . Advanced care planning/counseling discussion 08/14/2015  . Thrombocytopenia ([place]) 08/05/2015  . 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 History of ankle fracture 07/19/2013  . Drooping eyelid 03/09/2013  . Leg edema 02/16/2013  . Bradycardia 02/16/2013  . Erectile dysfunction 09/14/2012  . Gout 09/13/2012  . GERD (gastroesophageal reflux disease)   . HTN (hypertension)   . HLD (hyperlipidemia)   . OSA (obstructive sleep apnea)   . RLS (restless legs syndrome)   . Gilbert disease   . Narcotic dependence ([place])   . Cardiac murmur     Past Surgical History:  Procedure Laterality Date  . COLONOSCOPY  02/2011   ext hem, benign polyp, rpt 5 yrs ([place])  . LEG SURGERY  x5   left - after fall at work (3.5 stories)  . MANDIBLE SURGERY  1965   jaw  fracture - horse kick  . TYMPANIC MEMBRANE REPAIR Left 1994   otosclerosis    Prior to Admission medications   Medication Sig Start Date End Date Taking? Authorizing Provider  clindamycin (CLEOCIN) 150 MG capsule Take 2 capsules (300 mg total) by mouth 3 (three) times daily. 10/04/17   Johnn Hai, PA-C  colchicine 0.6 MG tablet Take 1 tablet (0.6 mg total) by mouth daily. 05/07/17   [name], MD  doxazosin (CARDURA) 2 MG tablet Take 1 tablet (2 mg total) by mouth 2 (two) times daily. 08/25/17   Minna Merritts, MD  fluticasone (FLONASE) 50 MCG/ACT nasal spray Place 2 sprays into both nostrils daily. 05/19/17   [name], MD  gabapentin  (NEURONTIN) 300 MG capsule Take 1 capsule (300 mg total) by mouth 3 (three) times daily. 08/21/17   [name], MD  hydrALAZINE (APRESOLINE) 100 MG tablet Take 100 mg by mouth 3 (three) times daily. 07/31/17   [provider]  HYDROcodone-acetaminophen (NORCO) 10-325 MG tablet Take 1 tablet by mouth every 6 (six) hours as needed. 09/21/17   [name], MD  losartan (COZAAR) 100 MG tablet Take 1 tablet (100 mg total) by mouth daily. 07/16/17   [name], MD  meclizine (ANTIVERT) 25 MG tablet Take 1 tablet (25 mg total) 3 (three) times daily as needed by mouth for dizziness. 06/09/17   Theora Gianotti, NP  nitroGLYCERIN (NITROSTAT) 0.4 MG SL tablet Place 1 tablet (0.4 mg total) under the tongue every 5 (five) minutes as needed for chest pain. 02/16/13   Minna Merritts, MD  oxyCODONE-acetaminophen (PERCOCET) 7.5-325 MG tablet Take 1 tablet by mouth every 6 (six) hours as needed for severe pain. 10/04/17 10/04/18  Johnn Hai, PA-C  pravastatin (PRAVACHOL) 40 MG tablet TAKE 1 TABLET BY MOUTH AT BEDTIME 03/01/17   [name], MD  probenecid (BENEMID) 500 MG tablet Take 0.5 tablets (250 mg total) by mouth 2 (two) times daily. 05/19/17   [name], MD  vitamin C (ASCORBIC ACID) 500 MG tablet Take 500 mg by mouth daily.    [provider]    Allergies Allopurinol; Lisinopril-hydrochlorothiazide; Procardia [nifedipine]; Spironolactone; Amlodipine; and Uloric [febuxostat]  Family History  Problem Relation Age of Onset  . Cancer Father        prostate  . Cancer Mother        rectal  . Hypertension Mother   . Cancer Paternal Uncle        prostate  . Diabetes Neg Hx   . Stroke Neg Hx   . CAD Neg Hx     Social History Social History   Tobacco Use  . Smoking status: Former Smoker    Packs/day: 1.00    Years: 4.00    Pack years: 4.00    Types: Pipe    Last attempt to quit: 07/27/1978    Years since quitting: 39.2  . Smokeless  tobacco: Never Used  Substance Use Topics  . Alcohol use: No    Alcohol/week: 0.0 oz  . Drug use: No    Review of Systems Constitutional: No fever/chills Cardiovascular: Denies chest pain. Respiratory: Denies shortness of breath. Gastrointestinal:   No nausea, no vomiting.  Musculoskeletal: Positive for right hip, knee, leg pain. Skin: Negative for rash. Neurological: Negative for headaches, focal weakness or numbness. ___________________________________________   PHYSICAL EXAM:  VITAL SIGNS: ED Triage Vitals  Enc Vitals Group     BP 10/04/17 0441 (!) 185/86     Pulse  Rate 10/04/17 0441 65     Resp 10/04/17 0441 20     Temp 10/04/17 0441 99 F (37.2 C)     Temp Source 10/04/17 0441 Oral     SpO2 10/04/17 0441 97 %     Weight 10/04/17 0440 215 lb (97.5 kg)     Height 10/04/17 0440 5\' 7"  (1.702 m)     Head Circumference --      Peak Flow --      Pain Score 10/04/17 0440 9     Pain Loc --      Pain Edu? --      Excl. in [place]? --    Constitutional: Alert and oriented. Well appearing and in no acute distress. Eyes: Conjunctivae are normal.  Head: Atraumatic. Nose: No congestion/rhinnorhea. Neck: No stridor.   Cardiovascular: Normal rate, regular rhythm. Grossly normal heart sounds.  Good peripheral circulation. Respiratory: Normal respiratory effort.  No retractions. Lungs CTAB. Musculoskeletal: Examination of the right hip there is no discoloration.  There is some minimal soft tissue tenderness but no gross deformity.  Overall there is no shortening or rotation of the leg.  With examination of the tib-fib there is erythema and warmth noted on the lateral aspect.  It is difficult to determine how much soft tissue swelling patient does have because his left leg has chronic atrophy.  Pulses present.  Motor sensory function intact.  Is no effusion noted around the right knee. Neurologic:  Normal speech and language. No gross focal neurologic deficits are appreciated. Skin:  Skin  is warm, dry and intact.  Erythema to the right leg as noted above. Psychiatric: Mood and affect are normal. Speech and behavior are normal.  ____________________________________________   LABS (all labs ordered are listed, but only abnormal results are displayed)  Labs Reviewed  CBC WITH DIFFERENTIAL/PLATELET - Abnormal; Notable for the following components:      Result Value   WBC 10.8 (*)    Neutro Abs 7.9 (*)    Monocytes Absolute 1.1 (*)    All other components within normal limits  COMPREHENSIVE METABOLIC PANEL - Abnormal; Notable for the following components:   Chloride 98 (*)    Glucose, Bld 116 (*)    Total Bilirubin 1.3 (*)    All other components within normal limits    RADIOLOGY   Official radiology report(s): US Venous Img Lower Unilateral Right  Result Date: 10/04/2017 CLINICAL DATA:  Pain, swelling EXAM: RIGHT LOWER EXTREMITY VENOUS DOPPLER ULTRASOUND TECHNIQUE: Gray-scale sonography with graded compression, as well as color Doppler and duplex ultrasound were performed to evaluate the lower extremity deep venous systems from the level of the common femoral vein and including the common femoral, femoral, profunda femoral, popliteal and calf veins including the posterior tibial, peroneal and gastrocnemius veins when visible. The superficial great saphenous vein was also interrogated. Spectral Doppler was utilized to evaluate flow at rest and with distal augmentation maneuvers in the common femoral, femoral and popliteal veins. COMPARISON:  None. FINDINGS: Contralateral Common Femoral Vein: Respiratory phasicity is normal and symmetric with the symptomatic side. No evidence of thrombus. Normal compressibility. Common Femoral Vein: No evidence of thrombus. Normal compressibility, respiratory phasicity and response to augmentation. Saphenofemoral Junction: No evidence of thrombus. Normal compressibility and flow on color Doppler imaging. Profunda Femoral Vein: No evidence of  thrombus. Normal compressibility and flow on color Doppler imaging. Femoral Vein: No evidence of thrombus. Normal compressibility, respiratory phasicity and response to augmentation. Popliteal Vein: No evidence of thrombus.  Normal compressibility, respiratory phasicity and response to augmentation. Calf Veins: No evidence of thrombus. Normal compressibility and flow on color Doppler imaging. Superficial Great Saphenous Vein: No evidence of thrombus. Normal compressibility. Venous Reflux:  None. Other Findings:  None. IMPRESSION: No evidence of deep venous thrombosis. Electronically Signed   By: Rolm Baptise M.D.   On: 10/04/2017 09:03    ____________________________________________   PROCEDURES  Procedure(s) performed: None  Procedures  Critical Care performed: No  ____________________________________________   INITIAL IMPRESSION / ASSESSMENT AND PLAN / ED COURSE Ultrasound was negative for DVT and patient and family member was made aware.  Patient received clindamycin 600 mg IV while in the department..  Patient got some relief with Percocet.  He was given information about cellulitis and also discharged with prescription for clindamycin 150 mg 2 capsules 3 times daily for the next 10 days.  Patient was also given a prescription for Percocet 7.5 mg 1 every 6 hours as needed for pain.  He is to follow-up with his PCP or return to the emergency department if any severe worsening of his symptoms or urgent concerns.  ____________________________________________   FINAL CLINICAL IMPRESSION(S) / ED DIAGNOSES  Final diagnoses:  Cellulitis of right lower leg     ED Discharge Orders        Ordered    clindamycin (CLEOCIN) 150 MG capsule  3 times daily     10/04/17 1044    oxyCODONE-acetaminophen (PERCOCET) 7.5-325 MG tablet  Every 6 hours PRN     10/04/17 1045       Note:  This document was prepared using Dragon voice recognition software and may include unintentional dictation  errors.    Johnn Hai, PA-C 10/04/17 1623    Earleen Newport, MD 10/05/17 [phone]

## 2017-10-04 NOTE — ED Triage Notes (Signed)
Patient reports right knee and hip pain since Thursday that became worse tonight.  Patient denies any type of injury.

## 2017-10-04 NOTE — Discharge Instructions (Signed)
Follow-up with your primary care provider this week for recheck of your leg.  Begin taking clindamycin every 6 hours.  Your first dose was given to you in the emergency department.  Also Percocet as needed for pain.  You may also need to take anti-inflammatories such as ibuprofen or Aleve between your doses of Percocet for inflammation. Elevate leg when sitting.

## 2017-10-05 ENCOUNTER — Ambulatory Visit: Payer: PPO | Admitting: Cardiovascular Disease

## 2017-10-05 ENCOUNTER — Encounter: Payer: Self-pay | Admitting: Cardiovascular Disease

## 2017-10-07 ENCOUNTER — Telehealth: Payer: Self-pay

## 2017-10-07 ENCOUNTER — Other Ambulatory Visit
Admission: RE | Admit: 2017-10-07 | Discharge: 2017-10-07 | Disposition: A | Discharge status: Home / Self Care | Payer: PPO | Source: Ambulatory Visit | Attending: Orthopedic Surgery | Admitting: Orthopedic Surgery

## 2017-10-07 ENCOUNTER — Ambulatory Visit (INDEPENDENT_AMBULATORY_CARE_PROVIDER_SITE_OTHER): Payer: PPO | Admitting: Family Medicine

## 2017-10-07 ENCOUNTER — Encounter: Payer: Self-pay | Admitting: Family Medicine

## 2017-10-07 VITALS — BP 170/80 | HR 51 | Temp 97.8°F | Wt 222.0 lb

## 2017-10-07 DIAGNOSIS — M79604 Pain in right leg: Secondary | ICD-10-CM | POA: Diagnosis not present

## 2017-10-07 DIAGNOSIS — M25461 Effusion, right knee: Secondary | ICD-10-CM | POA: Insufficient documentation

## 2017-10-07 DIAGNOSIS — I10 Essential (primary) hypertension: Secondary | ICD-10-CM

## 2017-10-07 DIAGNOSIS — M1A09X0 Idiopathic chronic gout, multiple sites, without tophus (tophi): Secondary | ICD-10-CM | POA: Diagnosis not present

## 2017-10-07 DIAGNOSIS — G90522 Complex regional pain syndrome I of left lower limb: Secondary | ICD-10-CM | POA: Diagnosis not present

## 2017-10-07 DIAGNOSIS — M7061 Trochanteric bursitis, right hip: Secondary | ICD-10-CM | POA: Diagnosis not present

## 2017-10-07 LAB — CBC WITH DIFFERENTIAL/PLATELET
BASOS PCT: 1.1 % (ref 0.0–3.0)
Basophils Absolute: 0.1 10*3/uL (ref 0.0–0.1)
EOS PCT: 2.3 % (ref 0.0–5.0)
Eosinophils Absolute: 0.2 10*3/uL (ref 0.0–0.7)
HCT: 42.3 % (ref 39.0–52.0)
HEMOGLOBIN: 14.5 g/dL (ref 13.0–17.0)
LYMPHS ABS: 1.5 10*3/uL (ref 0.7–4.0)
Lymphocytes Relative: 19.7 % (ref 12.0–46.0)
MCHC: 34.2 g/dL (ref 30.0–36.0)
MCV: 88.1 fl (ref 78.0–100.0)
MONO ABS: 0.9 10*3/uL (ref 0.1–1.0)
Monocytes Relative: 12.3 % — ABNORMAL HIGH (ref 3.0–12.0)
Neutro Abs: 5 10*3/uL (ref 1.4–7.7)
Neutrophils Relative %: 64.6 % (ref 43.0–77.0)
Platelets: 184 10*3/uL (ref 150.0–400.0)
RBC: 4.8 Mil/uL (ref 4.22–5.81)
RDW: 14 % (ref 11.5–15.5)
WBC: 7.7 10*3/uL (ref 4.0–10.5)

## 2017-10-07 LAB — SYNOVIAL CELL COUNT + DIFF, W/ CRYSTALS
EOSINOPHILS-SYNOVIAL: 0 %
Lymphocytes-Synovial Fld: 1 %
Monocyte-Macrophage-Synovial Fluid: 15 %
NEUTROPHIL, SYNOVIAL: 84 %
Other Cells-SYN: 0
WBC, SYNOVIAL: 2067 /mm3 — AB (ref 0–200)

## 2017-10-07 LAB — HIGH SENSITIVITY CRP: CRP, High Sensitivity: 31.2 mg/L — ABNORMAL HIGH (ref 0.000–5.000)

## 2017-10-07 LAB — URIC ACID: Uric Acid, Serum: 5 mg/dL (ref 4.0–7.8)

## 2017-10-07 LAB — CK: Total CK: 54 U/L (ref 7–232)

## 2017-10-07 LAB — SEDIMENTATION RATE: SED RATE: 54 mm/h — AB (ref 0–20)

## 2017-10-07 MED ORDER — OLMESARTAN MEDOXOMIL 40 MG PO TABS: 40.0000 mg | ORAL_TABLET | Freq: Every day | ORAL | 3 refills | Status: DC

## 2017-10-07 MED ORDER — METHOCARBAMOL 500 MG PO TABS: 500.0000 mg | ORAL_TABLET | Freq: Four times a day (QID) | ORAL | 0 refills | Status: DC

## 2017-10-07 NOTE — Telephone Encounter (Signed)
Randy Kennedy addressed already. done

## 2017-10-07 NOTE — Assessment & Plan Note (Addendum)
Unclear cause of current symptoms. He was started on clindamycin at ER for calf cellulitis (initial dose 680m IV) but today he doesn't have any erythema of right calf - would be strange for cellulitis to have fully resolved in 3 days.  Ddx includes severe muscle spasms, inflammatory arthritis or enthesitis flare, septic joint. I do think we need to refer urgently to ortho for further evaluation, r/o septic knee given degree of limited ROM.  Not typical for acute gout flare.  Denies any mechanism of knee injury.  Leg [place] negative for DVT.  Not consistent with radiculopathy or sciatica.  Good peripheral pulses point against arterial aneurysm/ischemia. If unrevealing ortho eval, would consider arterial ultrasound r/o aneurysm. Check stat labs today (ESR, CRP, CBC, CPK, and urate), refer urgently to ortho. Pt agrees with plan.  Exam with marked muscle spasm - will Rx robaxin for this. Continue oxycodone stronger narcotic acutely.

## 2017-10-07 NOTE — Telephone Encounter (Signed)
left message for patient to call Randy Kennedy back in regards to a referral-Jasmin Trumbull V Majesti Gambrell, RMA

## 2017-10-07 NOTE — Progress Notes (Signed)
BP (!) 170/80 (BP Location: Right Arm, Cuff Size: Normal)   Pulse (!) 51   Temp 97.8 F (36.6 C) (Oral)   Wt 222 lb (100.7 kg)   SpO2 96%   BMI 34.77 kg/m    CC: ER f/u visit Subjective:    Patient ID: ARMONI DEPASS, male    DOB: 1950-11-13, 67 y.o.   MRN: [id number]  HPI: MANCE VALLEJO is a 67 y.o. male presenting on 10/07/2017 for Hospitalization Follow-up (Seen at [facility] ED on 10/04/17, dx cellulitis of right lower leg. C/o extreme pain in right knee, posterior upper LE, hip and lower back. )   Recent ER visit for acute worsening of R leg pain - predominantly at knee. Records reviewed. WBC 10.8, R venous ultrasound negative for DVT. Thought R leg cellulitis, treated with IV clindamycin 646m x1 and percocet. Discharged with clindamycin course and percocet #20.   Describes 1 wk h/o R lateral leg pain at hip and knee, severe pain with bending knee. Also with R lower back pain. Also with R popliteal pain. Severe pain when sitting on commode. No rashes or fever or redness of knee. He had a cold earlier in the week. Denies inciting trauma/injury or falls. 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 Denies paresthesias or numbness down legs. No bowel/bladder accidents.   Difficult to control HTN - has seen cards for this. Current regimen is hydralazine 1079mTID,  Losartan 10022maily. Doxazosin may have caused itchy rash, improved off medication. Intolerance to lisinopril/hctz, amlodipine, spironolactone and procardia. Would avoid diuretics due to gout.   Relevant past medical, surgical, family and social history reviewed and updated as indicated. Interim medical history since our last visit reviewed. Allergies and medications reviewed and updated. Outpatient Medications Prior to Visit  Medication Sig Dispense Refill  . clindamycin (CLEOCIN) 150 MG capsule Take 2 capsules (300 mg total) by mouth 3 (three) times daily. 60 capsule 0  . colchicine 0.6 MG tablet Take 1 tablet (0.6 mg  total) by mouth daily.    . fluticasone (FLONASE) 50 MCG/ACT nasal spray Place 2 sprays into both nostrils daily. 16 g 6  . gabapentin (NEURONTIN) 300 MG capsule Take 1 capsule (300 mg total) by mouth 3 (three) times daily. (Patient taking differently: Take 300 mg by mouth 3 (three) times daily. As needed) 270 capsule 1  . hydrALAZINE (APRESOLINE) 100 MG tablet Take 100 mg by mouth 3 (three) times daily.    . HMarland KitchenDROcodone-acetaminophen (NORCO) 10-325 MG tablet Take 1 tablet by mouth every 6 (six) hours as needed. 90 tablet 0  . losartan (COZAAR) 100 MG tablet Take 1 tablet (100 mg total) by mouth daily. 90 tablet 3  . nitroGLYCERIN (NITROSTAT) 0.4 MG SL tablet Place 1 tablet (0.4 mg total) under the tongue every 5 (five) minutes as needed for chest pain. 25 tablet 3  . oxyCODONE-acetaminophen (PERCOCET) 7.5-325 MG tablet Take 1 tablet by mouth every 6 (six) hours as needed for severe pain. 20 tablet 0  . pravastatin (PRAVACHOL) 40 MG tablet TAKE 1 TABLET BY MOUTH AT BEDTIME 90 tablet 3  . probenecid (BENEMID) 500 MG tablet Take 0.5 tablets (250 mg total) by mouth 2 (two) times daily. 30 tablet 6  . vitamin C (ASCORBIC ACID) 500 MG tablet Take 500 mg by mouth daily.    . dMarland Kitchenxazosin (CARDURA) 2 MG tablet Take 1 tablet (2 mg total) by mouth 2 (two) times daily. 60 tablet 11  . meclizine (ANTIVERT) 25 MG  tablet Take 1 tablet (25 mg total) 3 (three) times daily as needed by mouth for dizziness. 30 tablet 3   No facility-administered medications prior to visit.      Per HPI unless specifically indicated in ROS section below Review of Systems     Objective:    BP (!) 170/80 (BP Location: Right Arm, Cuff Size: Normal)   Pulse (!) 51   Temp 97.8 F (36.6 C) (Oral)   Wt 222 lb (100.7 kg)   SpO2 96%   BMI 34.77 kg/m   Wt Readings from Last 3 Encounters:  10/07/17 222 lb (100.7 kg)  10/04/17 215 lb (97.5 kg)  08/25/17 224 lb 12 oz (101.9 kg)    Physical Exam  Constitutional: He appears  well-developed and well-nourished. He appears distressed.  Antalgic gait, favors R leg, trouble bending leg at hip and knee Walks with cane Needs assistance to get on exam table, does not bend knee while on exam table  Musculoskeletal: He exhibits edema.  Limited exam due to pain, stiffness, does NOT flex at R knee or hip No pain midline spine No paraspinous mm tenderness No pain at SIJ or sciatic notch bilaterally. Mild discomfort to palpation at R greater trochanteric bursa Tender to palpation posterior thigh without obvious mass, swelling appreciated L knee stable R knee exam: Painful to palpation popliteal area with fullness, painful to palpation lateral joint line Swelling at knee noted. Severely limited ROM with minimal ability to flex knee, keeps it in extension Warmth of R knee noted compared to left, but no erythema 2+ DP on right  Skin: Skin is warm and dry. No rash noted. No erythema.  No residual erythema to R lateral calf from cellulitis diagnosed at ER  Nursing note and vitals reviewed.  Results for orders placed or performed during the hospital encounter of 10/04/17  CBC with Differential  Result Value Ref Range   WBC 10.8 (H) 3.8 - 10.6 K/uL   RBC 4.68 4.40 - 5.90 MIL/uL   Hemoglobin 14.1 13.0 - 18.0 g/dL   HCT 41.4 40.0 - 52.0 %   MCV 88.4 80.0 - 100.0 fL   MCH 30.1 26.0 - 34.0 pg   MCHC 34.1 32.0 - 36.0 g/dL   RDW 13.8 11.5 - 14.5 %   Platelets 158 150 - 440 K/uL   Neutrophils Relative % 73 %   Neutro Abs 7.9 (H) 1.4 - 6.5 K/uL   Lymphocytes Relative 14 %   Lymphs Abs 1.5 1.0 - 3.6 K/uL   Monocytes Relative 11 %   Monocytes Absolute 1.1 (H) 0.2 - 1.0 K/uL   Eosinophils Relative 1 %   Eosinophils Absolute 0.1 0 - 0.7 K/uL   Basophils Relative 1 %   Basophils Absolute 0.1 0 - 0.1 K/uL  Comprehensive metabolic panel  Result Value Ref Range   Sodium 137 135 - 145 mmol/L   Potassium 4.3 3.5 - 5.1 mmol/L   Chloride 98 (L) 101 - 111 mmol/L   CO2 28 22 - 32  mmol/L   Glucose, Bld 116 (H) 65 - 99 mg/dL   BUN 16 6 - 20 mg/dL   Creatinine, Ser 1.13 0.61 - 1.24 mg/dL   Calcium 9.0 8.9 - 10.3 mg/dL   Total Protein 8.0 6.5 - 8.1 g/dL   Albumin 4.3 3.5 - 5.0 g/dL   AST 26 15 - 41 U/L   ALT 18 17 - 63 U/L   Alkaline Phosphatase 65 38 - 126 U/L   Total  Bilirubin 1.3 (H) 0.3 - 1.2 mg/dL   GFR calc non Af Amer >60 >60 mL/min   GFR calc Af Amer >60 >60 mL/min   Anion gap 11 5 - 15      Assessment & Plan:   Problem List Items Addressed This Visit    Gout    Current symptoms not classical for gout flare, consider gout enthesopathy? Continue probenecid, colchicine (taking 1 tab daily).       HTN (hypertension)    Persistently elevated - limited by intolerances to other antihypertensive medications - will change losartan to benicar 46m hopeful for more potent ARB. Continue hydralazine 1083mTID. Anticipate acute pain also exacerbating hypertension.      Relevant Medications   olmesartan (BENICAR) 40 MG tablet   Right leg pain - Primary    Unclear cause of current symptoms. He was started on clindamycin at ER for calf cellulitis (initial dose 60053mV) but today he doesn't have any erythema of right calf - would be strange for cellulitis to have fully resolved in 3 days.  Ddx includes severe muscle spasms, inflammatory arthritis or enthesitis flare, septic joint. I do think we need to refer urgently to ortho for further evaluation, r/o septic knee given degree of limited ROM.  Not typical for acute gout flare.  Denies any mechanism of knee injury.  Leg US [place] negative for DVT.  Not consistent with radiculopathy or sciatica.  Good peripheral pulses point against arterial aneurysm/ischemia. If unrevealing ortho eval, would consider arterial ultrasound r/o aneurysm. Check stat labs today (ESR, CRP, CBC, CPK, and urate), refer urgently to ortho. Pt agrees with plan.  Exam with marked muscle spasm - will Rx robaxin for this. Continue oxycodone stronger  narcotic acutely.       Relevant Orders   Ambulatory referral to Orthopedic Surgery   CBC with Differential/Platelet   Sedimentation rate   CK   Uric acid   High sensitivity CRP   RSD lower limb    Chronic, of LEFT ankle, with neuropathy.  He has backed off gabapentin as he's been off his feet last few weeks and neuropathic foot pain has been stable.       Relevant Medications   methocarbamol (ROBAXIN) 500 MG tablet       Meds ordered this encounter  Medications  . methocarbamol (ROBAXIN) 500 MG tablet    Sig: Take 1 tablet (500 mg total) by mouth 4 (four) times daily.    Dispense:  40 tablet    Refill:  0  . olmesartan (BENICAR) 40 MG tablet    Sig: Take 1 tablet (40 mg total) by mouth daily.    Dispense:  30 tablet    Refill:  3    In place of losartan   Orders Placed This Encounter  Procedures  . CBC with Differential/Platelet  . Sedimentation rate  . CK  . Uric acid  . High sensitivity CRP  . Ambulatory referral to Orthopedic Surgery    Referral Priority:   Urgent    Referral Type:   Surgical    Referral Reason:   Specialty Services Required    Requested Specialty:   Orthopedic Surgery    Number of Visits Requested:   1    Follow up plan: No Follow-up on file.  JavRia BushD

## 2017-10-07 NOTE — Telephone Encounter (Signed)
PTs wife returned call.

## 2017-10-07 NOTE — Patient Instructions (Addendum)
Labs today See our referral coordinators for urgent ortho evaluation.  Continue clindamycin Add on muscle relaxant sent to pharmacy.  Trial benicar (olmisartan) in place of losartan (stronger potency).

## 2017-10-07 NOTE — Assessment & Plan Note (Addendum)
Current symptoms not classical for gout flare, consider gout enthesopathy? Continue probenecid, colchicine (taking 1 tab daily).

## 2017-10-07 NOTE — Assessment & Plan Note (Signed)
Persistently elevated - limited by intolerances to other antihypertensive medications - will change losartan to benicar 40mg  hopeful for more potent ARB. Continue hydralazine 100mg  TID. Anticipate acute pain also exacerbating hypertension.

## 2017-10-07 NOTE — Assessment & Plan Note (Addendum)
Chronic, of LEFT ankle, with neuropathy.  He has backed off gabapentin as he's been off his feet last few weeks and neuropathic foot pain has been stable.

## 2017-10-11 ENCOUNTER — Encounter: Payer: Self-pay | Admitting: Family Medicine

## 2017-10-11 ENCOUNTER — Other Ambulatory Visit: Payer: Self-pay | Admitting: *Deleted

## 2017-10-11 LAB — BODY FLUID CULTURE: Culture: NO GROWTH

## 2017-10-11 MED ORDER — HYDRALAZINE HCL 100 MG PO TABS: 150.0000 mg | ORAL_TABLET | ORAL | 6 refills | Status: DC

## 2017-10-12 LAB — AEROBIC/ANAEROBIC CULTURE W GRAM STAIN (SURGICAL/DEEP WOUND): Culture: NO GROWTH

## 2017-10-12 LAB — AEROBIC/ANAEROBIC CULTURE (SURGICAL/DEEP WOUND)

## 2017-10-17 ENCOUNTER — Other Ambulatory Visit: Payer: Self-pay | Admitting: Family Medicine

## 2017-10-18 NOTE — Telephone Encounter (Signed)
Last written 09-21-17 #90 Last OV 10-07-17 NO Future OV Last UDS/CSA 09-29-16

## 2017-10-19 MED ORDER — HYDROCODONE-ACETAMINOPHEN 10-325 MG PO TABS: 1.0000 | ORAL_TABLET | Freq: Four times a day (QID) | ORAL | 0 refills | Status: DC | PRN

## 2017-10-19 NOTE — Telephone Encounter (Signed)
Eprescribed.

## 2017-11-17 ENCOUNTER — Other Ambulatory Visit: Payer: Self-pay | Admitting: Family Medicine

## 2017-11-17 ENCOUNTER — Telehealth: Payer: Self-pay | Admitting: Family Medicine

## 2017-11-17 MED ORDER — COLCHICINE 0.6 MG PO TABS: 0.6000 mg | ORAL_TABLET | Freq: Every day | ORAL | 3 refills | Status: DC

## 2017-11-17 NOTE — Telephone Encounter (Signed)
Pt dropped off form for parking placard. Placed in RX tower. °

## 2017-11-17 NOTE — Telephone Encounter (Signed)
Filled and Lisa's box.

## 2017-11-17 NOTE — Telephone Encounter (Signed)
Last rx:  10/19/17, #90 Last OV:  10/07/17 Next OV:  none

## 2017-11-17 NOTE — Telephone Encounter (Signed)
Placed form in Dr. G's box.  

## 2017-11-18 NOTE — Telephone Encounter (Signed)
Spoke with pt notifying him Disability Parking form is ready to pick up.  [Placed form at front office.]

## 2017-11-19 MED ORDER — HYDROCODONE-ACETAMINOPHEN 10-325 MG PO TABS: 1.0000 | ORAL_TABLET | Freq: Four times a day (QID) | ORAL | 0 refills | Status: DC | PRN

## 2017-11-19 NOTE — Telephone Encounter (Signed)
°  Pt. Is calling about prescription  HYDROcodone-acetaminophen (NORCO) 10-325 MG tablet this was to be called in yesterday and they have not received prescription .   [id number], [place] - [address]  [place] [address]  Phone: [phone] Fax: [phone]

## 2017-11-19 NOTE — Telephone Encounter (Signed)
plz notify this was sent in E prescribed

## 2017-12-16 ENCOUNTER — Other Ambulatory Visit: Payer: Self-pay | Admitting: Family Medicine

## 2017-12-16 NOTE — Telephone Encounter (Signed)
Last rx:  11/19/17, #90 Last OV:  10/07/17 Next OV:  none

## 2017-12-18 MED ORDER — HYDROCODONE-ACETAMINOPHEN 10-325 MG PO TABS: 1.0000 | ORAL_TABLET | Freq: Four times a day (QID) | ORAL | 0 refills | Status: DC | PRN

## 2017-12-18 NOTE — Telephone Encounter (Signed)
Eprescribed.

## 2017-12-19 ENCOUNTER — Other Ambulatory Visit: Payer: Self-pay | Admitting: Family Medicine

## 2017-12-21 NOTE — Telephone Encounter (Signed)
Copied from [place] [id number]. Topic: Quick Communication - Rx Refill/Question >> Dec 21, 2017  5:40 PM Oliver Pila B wrote: Medication: probenecid (BENEMID) 500 MG tablet [[id number]]   Has the patient contacted their pharmacy? Yes.   (Agent: If no, request that the patient contact the pharmacy for the refill.) (Agent: If yes, when and what did the pharmacy advise?)  Preferred Pharmacy (with phone number or street name): [facility]  Agent: Please be advised that RX refills may take up to 3 business days. We ask that you follow-up with your pharmacy.

## 2017-12-22 NOTE — Telephone Encounter (Signed)
Last OV: 10/07/17  NO future appointments. Last Refill #30, 6 refills on 05/19/17  Appears patient/pharmacy requesting 90 day supply per [facility] RN.  Will pend for MD approval.

## 2017-12-22 NOTE — Telephone Encounter (Signed)
Medication refill quantity change request  Benemid 500 mg tabs  Last OV 10-07-2017 Dr Danise Mina  Last filled 05/19/2017 0.5 tab  Bid 30 tabs  6 refills   Pharmacy on file

## 2017-12-23 ENCOUNTER — Encounter: Payer: Self-pay | Admitting: Family Medicine

## 2017-12-23 ENCOUNTER — Ambulatory Visit (INDEPENDENT_AMBULATORY_CARE_PROVIDER_SITE_OTHER): Payer: PPO | Admitting: Family Medicine

## 2017-12-23 VITALS — BP 154/80 | HR 59 | Temp 98.9°F | Ht 67.0 in | Wt 227.5 lb

## 2017-12-23 DIAGNOSIS — M1A09X0 Idiopathic chronic gout, multiple sites, without tophus (tophi): Secondary | ICD-10-CM | POA: Diagnosis not present

## 2017-12-23 DIAGNOSIS — J069 Acute upper respiratory infection, unspecified: Secondary | ICD-10-CM

## 2017-12-23 DIAGNOSIS — I10 Essential (primary) hypertension: Secondary | ICD-10-CM | POA: Diagnosis not present

## 2017-12-23 DIAGNOSIS — H60503 Unspecified acute noninfective otitis externa, bilateral: Secondary | ICD-10-CM | POA: Diagnosis not present

## 2017-12-23 MED ORDER — AMOXICILLIN 875 MG PO TABS: 875.0000 mg | ORAL_TABLET | Freq: Two times a day (BID) | ORAL | 0 refills | Status: DC

## 2017-12-23 NOTE — Progress Notes (Signed)
BP (!) 154/80 (BP Location: Right Arm, Cuff Size: Large)   Pulse (!) 59   Temp 98.9 F (37.2 C) (Oral)   Ht 5\' 7"  (1.702 m)   Wt 227 lb 8 oz (103.2 kg)   SpO2 96%   BMI 35.63 kg/m    CC: ear pain Subjective:    Patient ID: Randy Kennedy, male    DOB: 06-28-1951, 67 y.o.   MRN: [id number]  HPI: Randy Kennedy is a 67 y.o. male presenting on 12/23/2017 for Ear Pain (In bilateral ears. Also, c/o sinus congestion/pressure, head congestion and muffled ears. Sxs started about 4 days ago. Tried OTC sinus meds, barely helpful. ) and Gout (Thinks may have gout flare in right hip down to knee.)   4d h/o ST with green PNDrianage, earaches, sinus congestion. Constant headache. Initially feverish.  Has tried mucinex severe congestion, other OTC remedies.  No fevers/chills, cough, dyspnea or change in wheezing.  No sick contacts at home.  No smokers at home.  Notes worsening lateral R knee pain. Ran out of probenecid a week ago - has refills at pharmacy.   HTN - current regimen is losartan 100mg  daily, hydralazine 150mg  TID.   Relevant past medical, surgical, family and social history reviewed and updated as indicated. Interim medical history since our last visit reviewed. Allergies and medications reviewed and updated. Outpatient Medications Prior to Visit  Medication Sig Dispense Refill  . colchicine 0.6 MG tablet Take 1 tablet (0.6 mg total) by mouth daily. 90 tablet 3  . fluticasone (FLONASE) 50 MCG/ACT nasal spray Place 2 sprays into both nostrils daily. 16 g 6  . gabapentin (NEURONTIN) 300 MG capsule Take 1 capsule (300 mg total) by mouth 3 (three) times daily. (Patient taking differently: Take 300 mg by mouth 3 (three) times daily. As needed) 270 capsule 1  . hydrALAZINE (APRESOLINE) 100 MG tablet Take 1.5 tablets (150 mg total) by mouth as directed. Three times daily and may increase to Four times daily if needed for high BP 180 tablet 6  . HYDROcodone-acetaminophen (NORCO)  10-325 MG tablet Take 1 tablet by mouth every 6 (six) hours as needed. 90 tablet 0  . losartan (COZAAR) 100 MG tablet Take 1 tablet (100 mg total) by mouth daily. 90 tablet 3  . methocarbamol (ROBAXIN) 500 MG tablet Take 1 tablet (500 mg total) by mouth 4 (four) times daily. 40 tablet 0  . nitroGLYCERIN (NITROSTAT) 0.4 MG SL tablet Place 1 tablet (0.4 mg total) under the tongue every 5 (five) minutes as needed for chest pain. 25 tablet 3  . pravastatin (PRAVACHOL) 40 MG tablet TAKE 1 TABLET BY MOUTH AT BEDTIME 90 tablet 3  . probenecid (BENEMID) 500 MG tablet TAKE 1/2 (ONE-HALF) TABLET BY MOUTH TWICE DAILY 90 tablet 1  . vitamin C (ASCORBIC ACID) 500 MG tablet Take 500 mg by mouth daily.    . clindamycin (CLEOCIN) 150 MG capsule Take 2 capsules (300 mg total) by mouth 3 (three) times daily. 60 capsule 0  . olmesartan (BENICAR) 40 MG tablet Take 1 tablet (40 mg total) by mouth daily. (Patient not taking: Reported on 12/23/2017) 30 tablet 3  . oxyCODONE-acetaminophen (PERCOCET) 7.5-325 MG tablet Take 1 tablet by mouth every 6 (six) hours as needed for severe pain. 20 tablet 0   No facility-administered medications prior to visit.      Per HPI unless specifically indicated in ROS section below Review of Systems     Objective:  BP (!) 154/80 (BP Location: Right Arm, Cuff Size: Large)   Pulse (!) 59   Temp 98.9 F (37.2 C) (Oral)   Ht 5\' 7"  (1.702 m)   Wt 227 lb 8 oz (103.2 kg)   SpO2 96%   BMI 35.63 kg/m   Wt Readings from Last 3 Encounters:  12/23/17 227 lb 8 oz (103.2 kg)  10/07/17 222 lb (100.7 kg)  10/04/17 215 lb (97.5 kg)    Physical Exam  Constitutional: He appears well-developed and well-nourished. No distress.  HENT:  Head: Normocephalic and atraumatic.  Right Ear: Hearing, external ear and ear canal normal. Tympanic membrane is erythematous and bulging.  Left Ear: Hearing, external ear and ear canal normal. Tympanic membrane is erythematous and bulging.  Nose: Mucosal  edema and rhinorrhea present. Right sinus exhibits maxillary sinus tenderness and frontal sinus tenderness. Left sinus exhibits maxillary sinus tenderness and frontal sinus tenderness.  Mouth/Throat: Uvula is midline, oropharynx is clear and moist and mucous membranes are normal. No oropharyngeal exudate, posterior oropharyngeal edema, posterior oropharyngeal erythema or tonsillar abscesses.  Mild erythema and bulging bilateral TMs  Eyes: Pupils are equal, round, and reactive to light. Conjunctivae and EOM are normal. No scleral icterus.  Neck: Normal range of motion. Neck supple.  Cardiovascular: Normal rate, regular rhythm and intact distal pulses.  Murmur (faint systolic) heard. Pulmonary/Chest: Effort normal and breath sounds normal. No respiratory distress. He has no wheezes. He has no rales.  Lungs overall clear  Lymphadenopathy:    He has no cervical adenopathy.  Skin: Skin is warm and dry. No rash noted.  Nursing note and vitals reviewed.   Lab Results  Component Value Date   LABURIC 5.0 10/07/2017    Lab Results  Component Value Date   CREATININE 1.13 10/04/2017   BUN 16 10/04/2017   NA 137 10/04/2017   K 4.3 10/04/2017   CL 98 (L) 10/04/2017   CO2 28 10/04/2017       Assessment & Plan:   Problem List Items Addressed This Visit    Acute upper respiratory infection of multiple sites - Primary    Anticipate developing sinusitis as well as bilat otitis media. Rx amoxicillin course, supportive care recommended. rec avoid decongestants given elevated BP. Update if not improving with treatment.      Gout    Ran out of probenecid, will pick up new Rx at pharmacy. Has been taking colchicine BID - advised decrease to QD dosing once probenecid restarted.       HTN (hypertension)    Elevated reading - may have been taking decongestants. Advised to check at home. Continue current BP regimen which is more effective than prior.        Other Visit Diagnoses    Acute otitis  externa of both ears, unspecified type           Meds ordered this encounter  Medications  . amoxicillin (AMOXIL) 875 MG tablet    Sig: Take 1 tablet (875 mg total) by mouth 2 (two) times daily.    Dispense:  20 tablet    Refill:  0   No orders of the defined types were placed in this encounter.   Follow up plan: Return if symptoms worsen or fail to improve.  [name], MD

## 2017-12-23 NOTE — Assessment & Plan Note (Signed)
Anticipate developing sinusitis as well as bilat otitis media. Rx amoxicillin course, supportive care recommended. rec avoid decongestants given elevated BP. Update if not improving with treatment.

## 2017-12-23 NOTE — Patient Instructions (Addendum)
Bring in BP cuff next office visit  Make sure not to take any decongestants for OTC allergy or cold medicines - as these can raise your blood pressure. Once you start probenecid, decrease colchicine to once daily. I think you have sinus and ear infection today. Treat with amoxicillin 875mg  twice daily with meals for 10 days. Push fluids and rest. Let us know if not improving with treatment.

## 2017-12-23 NOTE — Assessment & Plan Note (Signed)
Elevated reading - may have been taking decongestants. Advised to check at home. Continue current BP regimen which is more effective than prior.

## 2017-12-23 NOTE — Assessment & Plan Note (Signed)
Ran out of probenecid, will pick up new Rx at pharmacy. Has been taking colchicine BID - advised decrease to QD dosing once probenecid restarted.

## 2017-12-27 ENCOUNTER — Telehealth: Payer: Self-pay | Admitting: Family Medicine

## 2017-12-27 MED ORDER — PREDNISONE 20 MG PO TABS: 20.0000 mg | ORAL_TABLET | Freq: Every day | ORAL | 0 refills | Status: DC

## 2017-12-27 NOTE — Telephone Encounter (Signed)
pred rx sent.  F/u if not better.  Thanks.

## 2017-12-27 NOTE — Telephone Encounter (Signed)
Spoke to pt who states is gout is still flaring, even though he is taking both meds as directed. Pt is requesting a steroid, such as prednisone be sent to pharmacy

## 2017-12-27 NOTE — Telephone Encounter (Signed)
Copied from [place] [id number]. Topic: Quick Communication - See Telephone Encounter >> Dec 27, 2017  9:40 AM Conception Chancy, NT wrote: CRM for notification. See Telephone encounter for: 12/27/17.  Patient wife is calling and states he is needing something for gout in his hip and knee. States he mentioned it on 12/23/17 appt.   [id number], [place] - [address] [place] [address] Phone: [phone] Fax: [phone]

## 2017-12-27 NOTE — Telephone Encounter (Signed)
Patient advised.

## 2018-01-16 ENCOUNTER — Other Ambulatory Visit: Payer: Self-pay | Admitting: Family Medicine

## 2018-01-17 MED ORDER — HYDROCODONE-ACETAMINOPHEN 10-325 MG PO TABS: 1.0000 | ORAL_TABLET | Freq: Four times a day (QID) | ORAL | 0 refills | Status: DC | PRN

## 2018-01-17 NOTE — Telephone Encounter (Signed)
E perscribed 

## 2018-01-17 NOTE — Telephone Encounter (Signed)
Last rx:  12/18/17, #90 Last OV:  12/23/17 Next OV:  none

## 2018-02-10 ENCOUNTER — Other Ambulatory Visit: Payer: Self-pay | Admitting: Family Medicine

## 2018-02-10 NOTE — Telephone Encounter (Signed)
Name of Medication: hydrocodone apap 10-325 Name of [facility] or Written Date and Quantity: #90 on 01/17/18 Last Office Visit and Type: 12/23/17 acute visit; 10/07/17 HFU Next Office Visit and Type: none scheduled Last Controlled Substance Agreement Date: 09/29/16 Last UDS:09/30/16

## 2018-02-11 MED ORDER — HYDROCODONE-ACETAMINOPHEN 10-325 MG PO TABS: 1.0000 | ORAL_TABLET | Freq: Four times a day (QID) | ORAL | 0 refills | Status: DC | PRN

## 2018-02-11 NOTE — Telephone Encounter (Signed)
Eprescribed.

## 2018-02-14 NOTE — Telephone Encounter (Signed)
Notified pt hydrocodone apap was sent to [facility]. Pt voiced understanding.

## 2018-02-24 ENCOUNTER — Other Ambulatory Visit: Payer: Self-pay | Admitting: Family Medicine

## 2018-02-28 DIAGNOSIS — H25813 Combined forms of age-related cataract, bilateral: Secondary | ICD-10-CM | POA: Diagnosis not present

## 2018-03-14 ENCOUNTER — Other Ambulatory Visit: Payer: Self-pay | Admitting: Family Medicine

## 2018-03-15 MED ORDER — HYDROCODONE-ACETAMINOPHEN 10-325 MG PO TABS: 1.0000 | ORAL_TABLET | Freq: Four times a day (QID) | ORAL | 0 refills | Status: DC | PRN

## 2018-03-15 NOTE — Telephone Encounter (Signed)
Name of Medication: Hydrocodone-APAP Name of Pharmacy: [facility] or Written Date and Quantity: 02/11/18, #90 Last Office Visit and Type: 12/23/17, acute Next Office Visit and Type: none Last Controlled Substance Agreement Date: 09/29/16 Last UDS: 09/29/16

## 2018-03-15 NOTE — Telephone Encounter (Signed)
Eprescribed.

## 2018-04-17 ENCOUNTER — Other Ambulatory Visit: Payer: Self-pay | Admitting: Family Medicine

## 2018-04-18 NOTE — Telephone Encounter (Signed)
Name of Medication: Hydrocodone-APAP Name of Pharmacy: [facility] Last Fill or Written Date and Quantity: 03/15/18, #90 Last Office Visit and Type: 12/23/17, acute Next Office Visit and Type: none Last Controlled Substance Agreement Date: 09/29/16 Last UDS: 09/29/16

## 2018-04-20 ENCOUNTER — Encounter: Payer: Self-pay | Admitting: Family Medicine

## 2018-04-20 ENCOUNTER — Ambulatory Visit (INDEPENDENT_AMBULATORY_CARE_PROVIDER_SITE_OTHER): Payer: PPO | Admitting: Family Medicine

## 2018-04-20 VITALS — BP 156/78 | HR 54 | Temp 98.2°F | Ht 67.0 in | Wt 230.5 lb

## 2018-04-20 DIAGNOSIS — J302 Other seasonal allergic rhinitis: Secondary | ICD-10-CM

## 2018-04-20 DIAGNOSIS — J3089 Other allergic rhinitis: Secondary | ICD-10-CM | POA: Insufficient documentation

## 2018-04-20 DIAGNOSIS — G2581 Restless legs syndrome: Secondary | ICD-10-CM

## 2018-04-20 DIAGNOSIS — G8929 Other chronic pain: Secondary | ICD-10-CM

## 2018-04-20 DIAGNOSIS — M1A09X0 Idiopathic chronic gout, multiple sites, without tophus (tophi): Secondary | ICD-10-CM

## 2018-04-20 DIAGNOSIS — R011 Cardiac murmur, unspecified: Secondary | ICD-10-CM | POA: Diagnosis not present

## 2018-04-20 DIAGNOSIS — J309 Allergic rhinitis, unspecified: Secondary | ICD-10-CM | POA: Insufficient documentation

## 2018-04-20 DIAGNOSIS — I10 Essential (primary) hypertension: Secondary | ICD-10-CM

## 2018-04-20 DIAGNOSIS — G90522 Complex regional pain syndrome I of left lower limb: Secondary | ICD-10-CM | POA: Diagnosis not present

## 2018-04-20 DIAGNOSIS — Z23 Encounter for immunization: Secondary | ICD-10-CM

## 2018-04-20 DIAGNOSIS — F1120 Opioid dependence, uncomplicated: Secondary | ICD-10-CM

## 2018-04-20 MED ORDER — HYDROCODONE-ACETAMINOPHEN 10-325 MG PO TABS: 1.0000 | ORAL_TABLET | Freq: Four times a day (QID) | ORAL | 0 refills | Status: DC | PRN

## 2018-04-20 MED ORDER — NORTRIPTYLINE HCL 25 MG PO CAPS: 25.0000 mg | ORAL_CAPSULE | Freq: Every day | ORAL | 3 refills | Status: DC

## 2018-04-20 MED ORDER — LORATADINE 10 MG PO TABS: 10.0000 mg | ORAL_TABLET | Freq: Every day | ORAL | Status: DC

## 2018-04-20 NOTE — Progress Notes (Signed)
BP (!) 156/78 (BP Location: Left Arm, Patient Position: Sitting, Cuff Size: Large)   Pulse (!) 54   Temp 98.2 F (36.8 C) (Oral)   Ht 5\' 7"  (1.702 m)   Wt 230 lb 8 oz (104.6 kg)   SpO2 96%   BMI 36.10 kg/m   BP Readings from Last 3 Encounters:  04/20/18 (!) 156/78  12/23/17 (!) 154/80  10/07/17 (!) 170/80    CC: chronic pain visit Subjective:    Patient ID: Randy Kennedy, male    DOB: [birth year], 66 y.o.   MRN: [id number]  HPI: Randy Kennedy is a 67 y.o. male presenting on 04/20/2018 for Medication Refill   Chronic pain management - previously pt tried taper off narcotics late last year, but with weather changes chronic pain has returned. Takes hydrocodone 10/325mg  for  Pain worse in fall and winter.  He slowly tapered off gabapentin - didn't feel it was effective. Lyrica previously ineffective. Has not tried TCAs.   Recent gout flare to R shoulder - treats with colchicine and meloxicam with benefit. Also daily takes probenecid.   Relevant past medical, surgical, family and social history reviewed and updated as indicated. Interim medical history since our last visit reviewed. Allergies and medications reviewed and updated. Outpatient Medications Prior to Visit  Medication Sig Dispense Refill  . colchicine 0.6 MG tablet Take 1 tablet (0.6 mg total) by mouth daily. 90 tablet 3  . hydrALAZINE (APRESOLINE) 100 MG tablet Take 1.5 tablets (150 mg total) by mouth as directed. Three times daily and may increase to Four times daily if needed for high BP 180 tablet 6  . HYDROcodone-acetaminophen (NORCO) 10-325 MG tablet Take 1 tablet by mouth every 6 (six) hours as needed. 90 tablet 0  . losartan (COZAAR) 100 MG tablet Take 1 tablet (100 mg total) by mouth daily. 90 tablet 3  . meloxicam (MOBIC) 15 MG tablet Take 1 tablet by mouth daily as needed (For gout flare).     . nitroGLYCERIN (NITROSTAT) 0.4 MG SL tablet Place 1 tablet (0.4 mg total) under the tongue every 5 (five)  minutes as needed for chest pain. 25 tablet 3  . pravastatin (PRAVACHOL) 40 MG tablet TAKE 1 TABLET BY MOUTH AT BEDTIME 90 tablet 0  . probenecid (BENEMID) 500 MG tablet TAKE 1/2 (ONE-HALF) TABLET BY MOUTH TWICE DAILY 90 tablet 1  . vitamin C (ASCORBIC ACID) 500 MG tablet Take 500 mg by mouth daily.    Marland Kitchen amoxicillin (AMOXIL) 875 MG tablet Take 1 tablet (875 mg total) by mouth 2 (two) times daily. 20 tablet 0  . fluticasone (FLONASE) 50 MCG/ACT nasal spray Place 2 sprays into both nostrils daily. 16 g 6  . gabapentin (NEURONTIN) 300 MG capsule Take 1 capsule (300 mg total) by mouth 3 (three) times daily. (Patient taking differently: Take 300 mg by mouth 3 (three) times daily. As needed) 270 capsule 1  . methocarbamol (ROBAXIN) 500 MG tablet Take 1 tablet (500 mg total) by mouth 4 (four) times daily. 40 tablet 0  . predniSONE (DELTASONE) 20 MG tablet Take 1 tablet (20 mg total) by mouth daily with breakfast. 5 tablet 0   No facility-administered medications prior to visit.      Per HPI unless specifically indicated in ROS section below Review of Systems     Objective:    BP (!) 156/78 (BP Location: Left Arm, Patient Position: Sitting, Cuff Size: Large)   Pulse (!) 54   Temp 98.2 F (36.8  C) (Oral)   Ht 5\' 7"  (1.702 m)   Wt 230 lb 8 oz (104.6 kg)   SpO2 96%   BMI 36.10 kg/m   Wt Readings from Last 3 Encounters:  04/20/18 230 lb 8 oz (104.6 kg)  12/23/17 227 lb 8 oz (103.2 kg)  10/07/17 222 lb (100.7 kg)    Physical Exam  Constitutional: He appears well-developed and well-nourished. No distress.  HENT:  Nose: Rhinorrhea present.  Mouth/Throat: Oropharynx is clear and moist. No oropharyngeal exudate.  Cardiovascular: Normal rate and regular rhythm.  Murmur (2/6 systolic) heard. Pulmonary/Chest: Effort normal and breath sounds normal. No respiratory distress. He has no wheezes. He has no rales.  Nursing note and vitals reviewed.  Lab Results  Component Value Date   CREATININE  1.13 10/04/2017   BUN 16 10/04/2017   NA 137 10/04/2017   K 4.3 10/04/2017   CL 98 (L) 10/04/2017   CO2 28 10/04/2017       Assessment & Plan:   Problem List Items Addressed This Visit    RSD lower limb    Known chronic back pain with RLS + RSD of left leg with severe post traumatic ankle joint arthrosis. Trial TCA - gabapentin, lyrica not very helpful in the past. Discussed side effects to monitor for.       Relevant Medications   nortriptyline (PAMELOR) 25 MG capsule   RLS (restless legs syndrome)   Narcotic dependence ([place])    Has increased narcotic use due to increased pain attributed to weather changes. Will resume Q3 mo chronic pain management visits.       HTN (hypertension)    Elevated reading today - forgot to recheck.  He states he just took his mid day hydralazine. Reports compliance with medications.       Gout    Back on probenecid daily, as well as colchicine BID PRN gout flare and meloxicam with good benefit.       Encounter for chronic pain management - Primary    [place] CSRS reviewed Has restarted more regular use of chronic narcotic medications. Tolerating well.  RTC 3 mo f/u visit.       Cardiac murmur    Again heard today.       Allergic rhinitis    rec start daily antihistamine        Other Visit Diagnoses    Need for influenza vaccination       Relevant Orders   Flu Vaccine QUAD 36+ mos IM (Completed)       Meds ordered this encounter  Medications  . loratadine (CLARITIN) 10 MG tablet    Sig: Take 1 tablet (10 mg total) by mouth daily.  . nortriptyline (PAMELOR) 25 MG capsule    Sig: Take 1 capsule (25 mg total) by mouth at bedtime.    Dispense:  30 capsule    Refill:  3   Orders Placed This Encounter  Procedures  . Flu Vaccine QUAD 36+ mos IM    Follow up plan: Return in about 3 months (around 07/20/2018) for medicare wellness visit, annual exam, prior fasting for blood work.  [name], MD

## 2018-04-20 NOTE — Patient Instructions (Addendum)
Flu shot today Hydrocodone refilled today.  Let's try nortripytline (pamelor) 25mg  at bedtime. This medicine will hopefully help back pain and nerve pain.  Try claritin or allegra for allergies.  Return in 3 months for wellness visit/physical.

## 2018-04-20 NOTE — Assessment & Plan Note (Signed)
Again heard today.  

## 2018-04-20 NOTE — Assessment & Plan Note (Addendum)
[place] CSRS reviewed Has restarted more regular use of chronic narcotic medications. Tolerating well.  RTC 3 mo f/u visit.

## 2018-04-20 NOTE — Assessment & Plan Note (Addendum)
Back on probenecid daily, as well as colchicine BID PRN gout flare and meloxicam with good benefit.

## 2018-04-20 NOTE — Telephone Encounter (Signed)
Eprescribed.

## 2018-04-20 NOTE — Assessment & Plan Note (Signed)
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 Trial TCA - gabapentin, lyrica not very helpful in the past. Discussed side effects to monitor for.

## 2018-04-20 NOTE — Assessment & Plan Note (Addendum)
Has increased narcotic use due to increased pain attributed to weather changes. Will resume Q3 mo chronic pain management visits.

## 2018-04-20 NOTE — Assessment & Plan Note (Signed)
Elevated reading today - forgot to recheck.  He states he just took his mid day hydralazine. Reports compliance with medications.

## 2018-04-20 NOTE — Assessment & Plan Note (Signed)
rec start daily antihistamine

## 2018-05-12 ENCOUNTER — Other Ambulatory Visit: Payer: Self-pay | Admitting: Family Medicine

## 2018-05-15 ENCOUNTER — Other Ambulatory Visit: Payer: Self-pay | Admitting: Family Medicine

## 2018-05-16 NOTE — Telephone Encounter (Signed)
Name of Medication: Hydrocodone-APAP Name of Pharmacy: [facility] Last Fill or Written Date and Quantity: 04/20/18, #90 Last Office Visit and Type: 04/20/18, pain f/u Next Office Visit and Type: none Last Controlled Substance Agreement Date: 09/29/16 Last UDS: 09/29/16

## 2018-05-17 MED ORDER — HYDROCODONE-ACETAMINOPHEN 10-325 MG PO TABS: 1.0000 | ORAL_TABLET | Freq: Four times a day (QID) | ORAL | 0 refills | Status: DC | PRN

## 2018-05-17 NOTE — Telephone Encounter (Signed)
Eprescribed.

## 2018-06-04 ENCOUNTER — Telehealth: Payer: Self-pay | Admitting: Family Medicine

## 2018-06-06 NOTE — Telephone Encounter (Signed)
Will you contact pt to schedule wellness visit?

## 2018-06-08 ENCOUNTER — Telehealth: Payer: Self-pay | Admitting: Family Medicine

## 2018-06-08 NOTE — Telephone Encounter (Signed)
Left message asking pt to call office  °

## 2018-06-08 NOTE — Telephone Encounter (Signed)
Left message asking pt to call office  Will you contact pt to schedule wellness visit? cpx with dr g and medicare wellness with lisa

## 2018-06-08 NOTE — Telephone Encounter (Signed)
Will you contact pt to schedule wellness visit?

## 2018-06-09 NOTE — Telephone Encounter (Signed)
Noted  

## 2018-06-09 NOTE — Telephone Encounter (Signed)
Medicare wellness 12/6 cpx 12/10 Pt aware

## 2018-06-15 ENCOUNTER — Other Ambulatory Visit: Payer: Self-pay | Admitting: Family Medicine

## 2018-06-15 NOTE — Telephone Encounter (Signed)
Name of Medication: Hydrocodone-APAP Name of Pharmacy: [facility] Last Fill or Written Date and Quantity:  05/17/18, #90/0 Last Office Visit and Type: 04/20/18, chronic pain f/u Next Office Visit and Type: 07/05/18, CPE Last Controlled Substance Agreement Date: 09/29/16 Last UDS:  09/29/16

## 2018-06-15 NOTE — Telephone Encounter (Signed)
Probenecid Last filled:  03/19/18, #90/1 Last OV:  04/20/18, chronic pain f/u Next OV:  07/05/18, CPE

## 2018-06-18 ENCOUNTER — Other Ambulatory Visit: Payer: Self-pay | Admitting: Family Medicine

## 2018-06-20 MED ORDER — HYDROCODONE-ACETAMINOPHEN 10-325 MG PO TABS: 1.0000 | ORAL_TABLET | Freq: Four times a day (QID) | ORAL | 0 refills | Status: DC | PRN

## 2018-06-20 NOTE — Telephone Encounter (Signed)
Eprescribed.

## 2018-06-20 NOTE — Telephone Encounter (Addendum)
[facility] faxed note on 06/18/18 at 1:23 PM requesting refill hydrocodone apap 10/325 mg. Per this note in pts chart hydrocodone apap rx was sent to [facility] on 06/20/18. The pts cell  Is no longer in service and home # no answer and pts wife line remains consistently busy. Unable to notify pt rx has been sent to pharmacy.

## 2018-06-30 ENCOUNTER — Other Ambulatory Visit: Payer: Self-pay | Admitting: Family Medicine

## 2018-06-30 DIAGNOSIS — D696 Thrombocytopenia, unspecified: Secondary | ICD-10-CM

## 2018-06-30 DIAGNOSIS — E782 Mixed hyperlipidemia: Secondary | ICD-10-CM

## 2018-06-30 DIAGNOSIS — M1A09X0 Idiopathic chronic gout, multiple sites, without tophus (tophi): Secondary | ICD-10-CM

## 2018-06-30 DIAGNOSIS — Z125 Encounter for screening for malignant neoplasm of prostate: Secondary | ICD-10-CM

## 2018-07-01 ENCOUNTER — Ambulatory Visit (INDEPENDENT_AMBULATORY_CARE_PROVIDER_SITE_OTHER): Payer: PPO

## 2018-07-01 VITALS — BP 152/74 | HR 67 | Temp 98.1°F | Ht 67.0 in | Wt 231.0 lb

## 2018-07-01 DIAGNOSIS — D696 Thrombocytopenia, unspecified: Secondary | ICD-10-CM | POA: Diagnosis not present

## 2018-07-01 DIAGNOSIS — E782 Mixed hyperlipidemia: Secondary | ICD-10-CM

## 2018-07-01 DIAGNOSIS — Z125 Encounter for screening for malignant neoplasm of prostate: Secondary | ICD-10-CM

## 2018-07-01 DIAGNOSIS — Z0000 Encounter for general adult medical examination without abnormal findings: Secondary | ICD-10-CM

## 2018-07-01 DIAGNOSIS — Z23 Encounter for immunization: Secondary | ICD-10-CM | POA: Diagnosis not present

## 2018-07-01 DIAGNOSIS — M1A09X0 Idiopathic chronic gout, multiple sites, without tophus (tophi): Secondary | ICD-10-CM

## 2018-07-01 LAB — LIPID PANEL
Cholesterol: 173 mg/dL (ref 0–200)
HDL: 63.4 mg/dL (ref >39.00)
NonHDL: 109.94
Total CHOL/HDL Ratio: 3
Triglycerides: 220 mg/dL — ABNORMAL HIGH (ref 0.0–149.0)
VLDL: 44 mg/dL — ABNORMAL HIGH (ref 0.0–40.0)

## 2018-07-01 LAB — CBC WITH DIFFERENTIAL/PLATELET
Basophils Absolute: 0.1 10*3/uL (ref 0.0–0.1)
Basophils Relative: 1.7 % (ref 0.0–3.0)
Eosinophils Absolute: 0.3 10*3/uL (ref 0.0–0.7)
Eosinophils Relative: 3.9 % (ref 0.0–5.0)
HEMATOCRIT: 45.1 % (ref 39.0–52.0)
HEMOGLOBIN: 15.5 g/dL (ref 13.0–17.0)
Lymphocytes Relative: 27.3 % (ref 12.0–46.0)
Lymphs Abs: 1.8 10*3/uL (ref 0.7–4.0)
MCHC: 34.3 g/dL (ref 30.0–36.0)
MCV: 88.8 fl (ref 78.0–100.0)
Monocytes Absolute: 0.8 10*3/uL (ref 0.1–1.0)
Monocytes Relative: 12 % (ref 3.0–12.0)
Neutro Abs: 3.6 10*3/uL (ref 1.4–7.7)
Neutrophils Relative %: 55.1 % (ref 43.0–77.0)
Platelets: 187 10*3/uL (ref 150.0–400.0)
RBC: 5.08 Mil/uL (ref 4.22–5.81)
RDW: 13.6 % (ref 11.5–15.5)
WBC: 6.5 10*3/uL (ref 4.0–10.5)

## 2018-07-01 LAB — COMPREHENSIVE METABOLIC PANEL
ALBUMIN: 4.5 g/dL (ref 3.5–5.2)
ALT: 28 U/L (ref 0–53)
AST: 25 U/L (ref 0–37)
Alkaline Phosphatase: 74 U/L (ref 39–117)
BILIRUBIN TOTAL: 1.2 mg/dL (ref 0.2–1.2)
BUN: 27 mg/dL — ABNORMAL HIGH (ref 6–23)
CO2: 29 mEq/L (ref 19–32)
Calcium: 9.8 mg/dL (ref 8.4–10.5)
Chloride: 102 mEq/L (ref 96–112)
Creatinine, Ser: 1.34 mg/dL (ref 0.40–1.50)
GFR: 56.41 mL/min — ABNORMAL LOW (ref >60.00)
Glucose, Bld: 113 mg/dL — ABNORMAL HIGH (ref 70–99)
Potassium: 4.3 mEq/L (ref 3.5–5.1)
Sodium: 140 mEq/L (ref 135–145)
Total Protein: 7.5 g/dL (ref 6.0–8.3)

## 2018-07-01 LAB — LDL CHOLESTEROL, DIRECT: Direct LDL: 90 mg/dL

## 2018-07-01 LAB — URIC ACID: Uric Acid, Serum: 7.4 mg/dL (ref 4.0–7.8)

## 2018-07-01 LAB — PSA, MEDICARE: PSA: 0.64 ng/mL (ref 0.10–4.00)

## 2018-07-01 NOTE — Progress Notes (Signed)
Subjective:   Randy Kennedy is a 67 y.o. male who presents for Medicare Annual/Subsequent preventive examination.  Review of Systems:  N/A Cardiac Risk Factors include: advanced age (>31men, >81 women);male gender;hypertension;dyslipidemia;obesity (BMI >30kg/m2)     Objective:    Vitals: BP (!) 152/74 (BP Location: Right Arm, Patient Position: Sitting, Cuff Size: Large)   Pulse 67   Temp 98.1 F (36.7 C) (Oral)   Ht 5\' 7"  (1.702 m) Comment: shoes  Wt 231 lb (104.8 kg)   SpO2 93%   BMI 36.18 kg/m   Body mass index is 36.18 kg/m.  Advanced Directives 07/01/2018 10/04/2017 08/24/2017  Does Patient Have a Medical Advance Directive? Yes Yes No  Type of Paramedic of [place];Living will Living will -  Copy of [place] in Chart? No - copy requested - -  Would patient like information on creating a medical advance directive? - - No - Patient declined    Tobacco Social History   Tobacco Use  Smoking Status Former Smoker  . Packs/day: 1.00  . Years: 4.00  . Pack years: 4.00  . Types: Pipe  . Last attempt to quit: 07/27/1978  . Years since quitting: 39.9  Smokeless Tobacco Never Used     Counseling given: No   Clinical Intake:  Pre-visit preparation completed: Yes  Pain : 0-10 Pain Score: 8  Pain Type: Chronic pain Pain Location: Back(left leg) Pain Orientation: Left Pain Onset: More than a month ago Pain Frequency: Constant     Nutritional Status: BMI > 30  Obese Nutritional Risks: None Diabetes: No  How often do you need to have someone help you when you read instructions, pamphlets, or other written materials from your doctor or pharmacy?: 1 - Never What is the last grade level you completed in school?: MBA  Interpreter Needed?: No  Comments: pt lives with spouse Information entered by :: LPinson, LPN  Past Medical History:  Diagnosis Date  . Allergic rhinitis   . Arthritis   . Chest pain    a. 11/2011  reportedly normal stress test performed in [place].  . Chronic pain    L ankle and bilateral hips (remote fracture s/p surgeries), lower back (told has herniated disc)  . Closed avulsion fracture of right talus 06/2016  . Eczema   . Erectile dysfunction 09/14/2012  . Gilbert disease   . Gout 2007  . Hearing loss    otosclerosis  . Heartburn    mild, controlled with pepcid  . History of chicken pox   . 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 HLD (hyperlipidemia)   . HTN (hypertension)    difficult to control - clonidine and beta blockers caused bradycardia  . Morbid obesity ([place])   . 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 OSA (obstructive sleep apnea) 11/2011 sleep study   a. moderate, AHI 37.4 increased to 80 in REM, on BiPAP 14/10, 97% compliance >4 hrs (06/2013); b. Does not tolerate CPAP.  Marland Kitchen RLS (restless legs syndrome)   . 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   Reviewed prior workup (saw pain management at [facility]): chronic back pain with RLS + RSD L leg with severe L post-traumatic ankle joint arthosis, no mention of peripheral neuropathy. Treated with vicodin, prior tried fentanyl and butrans.  . Systolic murmur    Past Surgical History:  Procedure Laterality Date  . COLONOSCOPY  02/2011   ext hem,  benign polyp, rpt 5 yrs ([place])  . LEG SURGERY  x5   left - after fall at work (3.5 stories)  . MANDIBLE SURGERY  1965   jaw fracture - horse kick  . TYMPANIC MEMBRANE REPAIR Left 1994   otosclerosis   Family History  Problem Relation Age of Onset  . Cancer Father        prostate  . Cancer Mother        rectal  . Hypertension Mother   . Cancer Paternal Uncle        prostate  . Diabetes Neg Hx   . Stroke Neg Hx   . CAD Neg Hx    Social History   Socioeconomic History  . Marital status: Married    Spouse name: Not on file  . Number of children: Not on file  . Years of education: Not on file  . Highest education level: Not on file  Occupational  History  . Not on file  Social Needs  . Financial resource strain: Not on file  . Food insecurity:    Worry: Not on file    Inability: Not on file  . Transportation needs:    Medical: Not on file    Non-medical: Not on file  Tobacco Use  . Smoking status: Former Smoker    Packs/day: 1.00    Years: 4.00    Pack years: 4.00    Types: Pipe    Last attempt to quit: 07/27/1978    Years since quitting: 39.9  . Smokeless tobacco: Never Used  Substance and Sexual Activity  . Alcohol use: No    Alcohol/week: 0.0 standard drinks  . Drug use: No  . Sexual activity: Not on file  Lifestyle  . Physical activity:    Days per week: Not on file    Minutes per session: Not on file  . Stress: Not on file  Relationships  . Social connections:    Talks on phone: Not on file    Gets together: Not on file    Attends religious service: Not on file    Active member of club or organization: Not on file    Attends meetings of clubs or organizations: Not on file    Relationship status: Not on file  Other Topics Concern  . Not on file  Social History Narrative   Caffeine: 2 cans coke/day   Lives with wife and grown son    Occupation: retired, was Agricultural engineer.   On disability for chronic pain   Edu: MBA   Activity: no regular exercise   Deit: good water, fruits/vegetables daily    Outpatient Encounter Medications as of 07/01/2018  Medication Sig  . colchicine 0.6 MG tablet Take 1 tablet (0.6 mg total) by mouth daily.  . hydrALAZINE (APRESOLINE) 100 MG tablet Take 1.5 tablets (150 mg total) by mouth as directed. Three times daily and may increase to Four times daily if needed for high BP  . HYDROcodone-acetaminophen (NORCO) 10-325 MG tablet Take 1 tablet by mouth every 6 (six) hours as needed.  . loratadine (CLARITIN) 10 MG tablet Take 1 tablet (10 mg total) by mouth daily.  Marland Kitchen losartan (COZAAR) 100 MG tablet Take 1 tablet (100 mg total) by mouth daily.  . meloxicam (MOBIC) 15 MG  tablet Take 1 tablet by mouth daily as needed (For gout flare).   . nitroGLYCERIN (NITROSTAT) 0.4 MG SL tablet Place 1 tablet (0.4 mg total) under the tongue every 5 (five) minutes  as needed for chest pain.  . pravastatin (PRAVACHOL) 40 MG tablet TAKE 1 TABLET BY MOUTH AT BEDTIME. NEEDS ANNUAL WELLNESS APPOINTMENT FOR FURTHER REFILLS.  Marland Kitchen probenecid (BENEMID) 500 MG tablet TAKE 1/2 (ONE-HALF) TABLET BY MOUTH TWICE DAILY  . vitamin C (ASCORBIC ACID) 500 MG tablet Take 500 mg by mouth daily.  . [DISCONTINUED] nortriptyline (PAMELOR) 25 MG capsule Take 1 capsule (25 mg total) by mouth at bedtime.   No facility-administered encounter medications on file as of 07/01/2018.     Activities of Daily Living In your present state of health, do you have any difficulty performing the following activities: 07/01/2018  Hearing? Y  Vision? N  Difficulty concentrating or making decisions? N  Walking or climbing stairs? Y  Dressing or bathing? N  Doing errands, shopping? N  Preparing Food and eating ? N  Using the Toilet? N  In the past six months, have you accidently leaked urine? N  Do you have problems with loss of bowel control? N  Managing your Medications? N  Managing your Finances? N  Housekeeping or managing your Housekeeping? N  Some recent data might be hidden    Patient Care Team: [name], MD as PCP - General (Family Medicine)   Assessment:   This is a routine wellness examination for [place].  Hearing Screening Comments: Bilateral hearing aids Vision Screening Comments: Vision exam in October 2019 with Dr. Izell [place]  Exercise Activities and Dietary recommendations Current Exercise Habits: The patient does not participate in regular exercise at present, Exercise limited by: orthopedic condition(s)  Goals    . Patient Stated     Starting 07/01/2018, I will continue to take medications as prescribed.        Fall Risk Fall Risk  07/01/2018 01/07/2017 08/14/2015  Falls in  the past year? 0 Yes No  Number falls in past yr: - 1 -  Injury with Fall? - Yes -   Depression Screen PHQ 2/9 Scores 07/01/2018 01/07/2017 08/14/2015  PHQ - 2 Score 0 0 0  PHQ- 9 Score 0 - -    Cognitive Function MMSE - Mini Mental State Exam 07/01/2018  Orientation to time 5  Orientation to Place 5  Registration 3  Attention/ Calculation 0  Recall 1  Recall-comments unable to recall 2 of 3 words  Language- name 2 objects 0  Language- repeat 1  Language- follow 3 step command 3  Language- read & follow direction 0  Write a sentence 0  Copy design 0  Total score 18       PLEASE NOTE: A Mini-Cog screen was completed. Maximum score is 20. A value of 0 denotes this part of [place] MMSE was not completed or the patient failed this part of the Mini-Cog screening.   Mini-Cog Screening Orientation to Time - Max 5 pts Orientation to Place - Max 5 pts Registration - Max 3 pts Recall - Max 3 pts Language Repeat - Max 1 pts Language Follow 3 Step Command - Max 3 pts   Immunization History  Administered Date(s) Administered  . Influenza, Seasonal, Injecte, Preservative Fre 08/02/2012, 04/26/2013  . Influenza,inj,Quad PF,6+ Mos 04/30/2014, 08/14/2015, 03/24/2016, 05/19/2017, 04/20/2018  . Pneumococcal Conjugate-13 01/07/2017  . Pneumococcal Polysaccharide-23 07/01/2018  . Td 07/27/2012  . Zoster 04/26/2013    Screening Tests Health Maintenance  Topic Date Due  . COLONOSCOPY  03/12/2021  . TETANUS/TDAP  07/27/2022  . INFLUENZA VACCINE  Completed  . Hepatitis C Screening  Completed  . PNA vac  Low Risk Adult  Completed  . DTaP/Tdap/Td  Discontinued       Plan:     I have personally reviewed, addressed, and noted the following in the patient's chart:  A. Medical and social history B. Use of alcohol, tobacco or illicit drugs  C. Current medications and supplements D. Functional ability and status E.  Nutritional status F.  Physical activity G. Advance  directives H. List of other physicians I.  Hospitalizations, surgeries, and ER visits in previous 12 months J.  [place] to include hearing, vision, cognitive, depression L. Referrals and appointments - none  In addition, I have reviewed and discussed with patient certain preventive protocols, quality metrics, and best practice recommendations. A written personalized care plan for preventive services as well as general preventive health recommendations were provided to patient.  See attached scanned questionnaire for additional information.   Signed,   Lindell Noe, MHA, BS, LPN Health Coach

## 2018-07-01 NOTE — Patient Instructions (Signed)
Randy Kennedy , Thank you for taking time to come for your Medicare Wellness Visit. I appreciate your ongoing commitment to your health goals. Please review the following plan we discussed and let me know if I can assist you in the future.   These are the goals we discussed: Goals    . Patient Stated     Starting 07/01/2018, I will continue to take medications as prescribed.        This is a list of the screening recommended for you and due dates:  Health Maintenance  Topic Date Due  . Colon Cancer Screening  03/12/2021  . Tetanus Vaccine  07/27/2022  . Flu Shot  Completed  .  Hepatitis C: One time screening is recommended by Center for Disease Control  (CDC) for  adults born from [birth year] through 1965.   Completed  . Pneumonia vaccines  Completed  . DTaP/Tdap/Td vaccine  Discontinued   Preventive Care for Adults  A healthy lifestyle and preventive care can promote health and wellness. Preventive health guidelines for adults include the following key practices.  . A routine yearly physical is a good way to check with your health care provider about your health and preventive screening. It is a chance to share any concerns and updates on your health and to receive a thorough exam.  . Visit your dentist for a routine exam and preventive care every 6 months. Brush your teeth twice a day and floss once a day. Good oral hygiene prevents tooth decay and gum disease.  . The frequency of eye exams is based on your age, health, family medical history, use  of contact lenses, and other factors. Follow your health care provider's recommendations for frequency of eye exams.  . Eat a healthy diet. Foods like vegetables, fruits, whole grains, low-fat dairy products, and lean protein foods contain the nutrients you need without too many calories. Decrease your intake of foods high in solid fats, added sugars, and salt. Eat the right amount of calories for you. Get information about a proper diet from  your health care provider, if necessary.  . Regular physical exercise is one of the most important things you can do for your health. Most adults should get at least 150 minutes of moderate-intensity exercise (any activity that increases your heart rate and causes you to sweat) each week. In addition, most adults need muscle-strengthening exercises on 2 or more days a week.  Silver Sneakers may be a benefit available to you. To determine eligibility, you may visit the website: www.silversneakers.com or contact program at 484-124-5360 Mon-Fri between 8AM-8PM.   . Maintain a healthy weight. The body mass index (BMI) is a screening tool to identify possible weight problems. It provides an estimate of body fat based on height and weight. Your health care provider can find your BMI and can help you achieve or maintain a healthy weight.   For adults 20 years and older: ? A BMI below 18.5 is considered underweight. ? A BMI of 18.5 to 24.9 is normal. ? A BMI of 25 to 29.9 is considered overweight. ? A BMI of 30 and above is considered obese.   . Maintain normal blood lipids and cholesterol levels by exercising and minimizing your intake of saturated fat. Eat a balanced diet with plenty of fruit and vegetables. Blood tests for lipids and cholesterol should begin at age 29 and be repeated every 5 years. If your lipid or cholesterol levels are high, you are over 50, or  you are at high risk for heart disease, you may need your cholesterol levels checked more frequently. Ongoing high lipid and cholesterol levels should be treated with medicines if diet and exercise are not working.  . If you smoke, find out from your health care provider how to quit. If you do not use tobacco, please do not start.  . If you choose to drink alcohol, please do not consume more than 2 drinks per day. One drink is considered to be 12 ounces (355 mL) of beer, 5 ounces (148 mL) of wine, or 1.5 ounces (44 mL) of liquor.  . If you  are 46-56 years old, ask your health care provider if you should take aspirin to prevent strokes.  . Use sunscreen. Apply sunscreen liberally and repeatedly throughout the day. You should seek shade when your shadow is shorter than you. Protect yourself by wearing long sleeves, pants, a wide-brimmed hat, and sunglasses year round, whenever you are outdoors.  . Once a month, do a whole body skin exam, using a mirror to look at the skin on your back. Tell your health care provider of new moles, moles that have irregular borders, moles that are larger than a pencil eraser, or moles that have changed in shape or color.

## 2018-07-01 NOTE — Progress Notes (Signed)
PCP notes:   Health maintenance:  PPSV23 - administered  Abnormal screenings:   Mini-Cog score: 18/20 MMSE - Mini Mental State Exam 07/01/2018  Orientation to time 5  Orientation to Place 5  Registration 3  Attention/ Calculation 0  Recall 1  Recall-comments unable to recall 2 of 3 words  Language- name 2 objects 0  Language- repeat 1  Language- follow 3 step command 3  Language- read & follow direction 0  Write a sentence 0  Copy design 0  Total score 18    Patient concerns:   None  Nurse concerns:  None  Next PCP appt:   07/05/18 @ 1130

## 2018-07-05 ENCOUNTER — Ambulatory Visit (INDEPENDENT_AMBULATORY_CARE_PROVIDER_SITE_OTHER): Payer: PPO | Admitting: Family Medicine

## 2018-07-05 ENCOUNTER — Encounter: Payer: Self-pay | Admitting: Family Medicine

## 2018-07-05 ENCOUNTER — Other Ambulatory Visit: Payer: Self-pay | Admitting: Family Medicine

## 2018-07-05 VITALS — BP 170/82 | HR 57 | Temp 97.8°F | Ht 67.0 in | Wt 235.8 lb

## 2018-07-05 DIAGNOSIS — G8929 Other chronic pain: Secondary | ICD-10-CM | POA: Diagnosis not present

## 2018-07-05 DIAGNOSIS — E782 Mixed hyperlipidemia: Secondary | ICD-10-CM | POA: Diagnosis not present

## 2018-07-05 DIAGNOSIS — I160 Hypertensive urgency: Secondary | ICD-10-CM

## 2018-07-05 DIAGNOSIS — Z0000 Encounter for general adult medical examination without abnormal findings: Secondary | ICD-10-CM | POA: Diagnosis not present

## 2018-07-05 DIAGNOSIS — I10 Essential (primary) hypertension: Secondary | ICD-10-CM

## 2018-07-05 DIAGNOSIS — M1A09X0 Idiopathic chronic gout, multiple sites, without tophus (tophi): Secondary | ICD-10-CM | POA: Diagnosis not present

## 2018-07-05 DIAGNOSIS — Z1211 Encounter for screening for malignant neoplasm of colon: Secondary | ICD-10-CM | POA: Diagnosis not present

## 2018-07-05 DIAGNOSIS — R0602 Shortness of breath: Secondary | ICD-10-CM | POA: Diagnosis not present

## 2018-07-05 DIAGNOSIS — G4733 Obstructive sleep apnea (adult) (pediatric): Secondary | ICD-10-CM

## 2018-07-05 MED ORDER — DICLOFENAC SODIUM 1 % TD GEL: 1.0000 "application " | Freq: Three times a day (TID) | TRANSDERMAL | 3 refills | Status: DC

## 2018-07-05 MED ORDER — GABAPENTIN 300 MG PO CAPS: 300.0000 mg | ORAL_CAPSULE | Freq: Two times a day (BID) | ORAL | 3 refills | Status: DC

## 2018-07-05 MED ORDER — GABAPENTIN 300 MG PO CAPS: 300.0000 mg | ORAL_CAPSULE | Freq: Three times a day (TID) | ORAL | 3 refills | Status: DC

## 2018-07-05 NOTE — Assessment & Plan Note (Signed)
Weight gain noted - likely contributing to chronic pain, gout, hypertension.

## 2018-07-05 NOTE — Assessment & Plan Note (Signed)
Chronically difficult to control, complicated by multiple drug intolerances, bradycardia and gout. Very limited options. rec more regular compliance with hydralazine 150mg  TID, continue losartan 100mg  daily. Will ask him to return in 1 mo f/u visit and schedule f/u visit with cardiology for next month (would be due anyways).
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
## 2018-07-05 NOTE — Assessment & Plan Note (Addendum)
[place] CSRS reviewed.  Will start gabapentin in addition to his hydrocodone.  Will be due for UDS next visit

## 2018-07-05 NOTE — Assessment & Plan Note (Signed)
Preventative protocols reviewed and updated unless pt declined. Discussed healthy diet and lifestyle.  

## 2018-07-05 NOTE — Assessment & Plan Note (Signed)
Continues probenecid and colchicine daily, with meloxicam PRN gout flare. Consider fenofibrate for uricosuric effect.

## 2018-07-05 NOTE — Patient Instructions (Addendum)
Blood pressure was too high - start monitoring blood pressures more closely at home. Continue losartan 123m daily, hydralazine 1575mthree times daily. Schedule appointment with Dr GoGwenyth [facility] for next month.  Pass by lab to pick up stool kit for colon cancer screening.  If interested, check with pharmacy about new 2 shot shingles series (shingrix).  Schedule appointment with dentist for a check up.  Let me know stool regimen to update your chart.  Restart gabapentin 30038mwo times daily, use voltaren gel to painful areas.  EKG and walking oxygen test today.  Return in 3 months for follow up visit.  Return in 1 month for hypertension follow up.  Health Maintenance, Male A healthy lifestyle and preventive care is important for your health and wellness. Ask your health care provider about what schedule of regular examinations is right for you. What should I know about weight and diet? Eat a Healthy Diet  Eat plenty of vegetables, fruits, whole grains, low-fat dairy products, and lean protein.  Do not eat a lot of foods high in solid fats, added sugars, or salt.  Maintain a Healthy Weight Regular exercise can help you achieve or maintain a healthy weight. You should:  Do at least 150 minutes of exercise each week. The exercise should increase your heart rate and make you sweat (moderate-intensity exercise).  Do strength-training exercises at least twice a week.  Watch Your Levels of Cholesterol and Blood Lipids  Have your blood tested for lipids and cholesterol every 5 years starting at 35 67ars of age. If you are at high risk for heart disease, you should start having your blood tested when you are 20 75ars old. You may need to have your cholesterol levels checked more often if: ? Your lipid or cholesterol levels are high. ? You are older than 50 57ars of age. ? You are at high risk for heart disease.  What should I know about cancer screening? Many types of cancers can be  detected early and may often be prevented. Lung Cancer  You should be screened every year for lung cancer if: ? You are a current smoker who has smoked for at least 30 years. ? You are a former smoker who has quit within the past 15 years.  Talk to your health care provider about your screening options, when you should start screening, and how often you should be screened.  Colorectal Cancer  Routine colorectal cancer screening usually begins at 50 79ars of age and should be repeated every 5-10 years until you are 75 37ars old. You may need to be screened more often if early forms of precancerous polyps or small growths are found. Your health care provider may recommend screening at an earlier age if you have risk factors for colon cancer.  Your health care provider may recommend using home test kits to check for hidden blood in the stool.  A small camera at the end of a tube can be used to examine your colon (sigmoidoscopy or colonoscopy). This checks for the earliest forms of colorectal cancer.  Prostate and Testicular Cancer  Depending on your age and overall health, your health care provider may do certain tests to screen for prostate and testicular cancer.  Talk to your health care provider about any symptoms or concerns you have about testicular or prostate cancer.  Skin Cancer  Check your skin from head to toe regularly.  Tell your health care provider about any new moles or changes in moles, especially  if: ? There is a change in a mole's size, shape, or color. ? You have a mole that is larger than a pencil eraser.  Always use sunscreen. Apply sunscreen liberally and repeat throughout the day.  Protect yourself by wearing long sleeves, pants, a wide-brimmed hat, and sunglasses when outside.  What should I know about heart disease, diabetes, and high blood pressure?  If you are 18-39 years of age, have your blood pressure checked every 3-5 years. If you are 40 years of age  or older, have your blood pressure checked every year. You should have your blood pressure measured twice-once when you are at a hospital or clinic, and once when you are not at a hospital or clinic. Record the average of the two measurements. To check your blood pressure when you are not at a hospital or clinic, you can use: ? An automated blood pressure machine at a pharmacy. ? A home blood pressure monitor.  Talk to your health care provider about your target blood pressure.  If you are between 45-79 years old, ask your health care provider if you should take aspirin to prevent heart disease.  Have regular diabetes screenings by checking your fasting blood sugar level. ? If you are at a normal weight and have a low risk for diabetes, have this test once every three years after the age of 45. ? If you are overweight and have a high risk for diabetes, consider being tested at a younger age or more often.  A one-time screening for abdominal aortic aneurysm (AAA) by ultrasound is recommended for men aged 65-75 years who are current or former smokers. What should I know about preventing infection? Hepatitis B If you have a higher risk for hepatitis B, you should be screened for this virus. Talk with your health care provider to find out if you are at risk for hepatitis B infection. Hepatitis C Blood testing is recommended for:  Everyone born from 1945 through 1965.  Anyone with known risk factors for hepatitis C.  Sexually Transmitted Diseases (STDs)  You should be screened each year for STDs including gonorrhea and chlamydia if: ? You are sexually active and are younger than 67 years of age. ? You are older than 67 years of age and your health care provider tells you that you are at risk for this type of infection. ? Your sexual activity has changed since you were last screened and you are at an increased risk for chlamydia or gonorrhea. Ask your health care provider if you are at  risk.  Talk with your health care provider about whether you are at high risk of being infected with HIV. Your health care provider may recommend a prescription medicine to help prevent HIV infection.  What else can I do?  Schedule regular health, dental, and eye exams.  Stay current with your vaccines (immunizations).  Do not use any tobacco products, such as cigarettes, chewing tobacco, and e-cigarettes. If you need help quitting, ask your health care provider.  Limit alcohol intake to no more than 2 drinks per day. One drink equals 12 ounces of beer, 5 ounces of wine, or 1 ounces of hard liquor.  Do not use street drugs.  Do not share needles.  Ask your health care provider for help if you need support or information about quitting drugs.  Tell your health care provider if you often feel depressed.  Tell your health care provider if you have ever been abused or do   not feel safe at home. This information is not intended to replace advice given to you by your health care provider. Make sure you discuss any questions you have with your health care provider. Document Released: 01/09/2008 Document Revised: 03/11/2016 Document Reviewed: 04/16/2015 Elsevier Interactive Patient Education  2018 Elsevier Inc.  

## 2018-07-05 NOTE — Telephone Encounter (Signed)
Pharmacy has the following note attached to refill request:  DDI-Probenecid and voltaren, increased risk of risk of toxicity with voltaren. Please review and let us know

## 2018-07-05 NOTE — Assessment & Plan Note (Signed)
Endorses progression of this - anticipate multifactorial including untreated OSA possibly leading to increased pulmonary and R heart strain, obesity, allergies. Normal ambulatory pulse ox. Update EKG today. Will work towards better BP control as above.

## 2018-07-05 NOTE — Assessment & Plan Note (Addendum)
Intolerant of CPAP/BiPAP machine due to allergies. Discussed concern that untreated sleep apnea may be contributing to dyspnea.  Echocardiogram 05/2017 reviewed - EF normal, G2DD, dilated RA and increased pulm artery pressures

## 2018-07-05 NOTE — Assessment & Plan Note (Addendum)
Chronic, stable. Continue pravastatin.  Would consider fenofibrate for uricosuric effect.  The 10-year ASCVD risk score Randy Kennedy DC Brooke Bonito., et al., 2013) is: 22%   Values used to calculate the score:     Age: 67 years     Sex: Male     Is Non-Hispanic African American: No     Diabetic: No     Tobacco smoker: No     Systolic Blood Pressure: 469 mmHg     Is BP treated: Yes     HDL Cholesterol: 63.4 mg/dL     Total Cholesterol: 173 mg/dL

## 2018-07-06 NOTE — Telephone Encounter (Signed)
Spoke with pt answering his questions about the message from the pharmacy. Also, relayed Dr. [name] message.  Pt verbalizes understanding and expresses his thanks.

## 2018-07-06 NOTE — Telephone Encounter (Signed)
Got E prescribe error. plz call pharmacy to clarify.

## 2018-07-06 NOTE — Telephone Encounter (Signed)
Pt left v/m wanting cb about pharmacies concern about combining probenecid and voltaren.

## 2018-07-06 NOTE — Telephone Encounter (Signed)
Responded to pharmacy. Ok to do. Use voltaren PRN not scheduled.

## 2018-07-06 NOTE — Telephone Encounter (Signed)
Spoke with [name] about refill error message and drug interaction.  States they had put the refill on hold until hearing from [name].  I relayed Dr. [name] message.  Says they will document Dr. [name] remarks and fill rx for Randy Kennedy.

## 2018-07-13 ENCOUNTER — Other Ambulatory Visit: Payer: Self-pay | Admitting: Family Medicine

## 2018-07-14 MED ORDER — HYDROCODONE-ACETAMINOPHEN 10-325 MG PO TABS: 1.0000 | ORAL_TABLET | Freq: Four times a day (QID) | ORAL | 0 refills | Status: DC | PRN

## 2018-07-14 NOTE — Telephone Encounter (Signed)
Last office visit 07/05/2018 for CPE.  Last refilled 06/20/2018 for #90 with no refills.  UDS/Contract 09/30/2016.  Next Appt: 07/14/2019 for CPE.

## 2018-07-14 NOTE — Telephone Encounter (Signed)
Eprescribed.

## 2018-07-24 ENCOUNTER — Encounter: Payer: Self-pay | Admitting: Family Medicine

## 2018-07-24 DIAGNOSIS — M1A09X0 Idiopathic chronic gout, multiple sites, without tophus (tophi): Secondary | ICD-10-CM

## 2018-07-25 MED ORDER — PREDNISONE 20 MG PO TABS: ORAL_TABLET | ORAL | 0 refills | Status: DC

## 2018-08-03 ENCOUNTER — Encounter: Payer: Self-pay | Admitting: Family Medicine

## 2018-08-03 ENCOUNTER — Ambulatory Visit (INDEPENDENT_AMBULATORY_CARE_PROVIDER_SITE_OTHER): Payer: PPO | Admitting: Family Medicine

## 2018-08-03 VITALS — BP 154/80 | HR 58 | Temp 98.8°F | Ht 67.0 in | Wt 231.2 lb

## 2018-08-03 DIAGNOSIS — M25522 Pain in left elbow: Secondary | ICD-10-CM

## 2018-08-03 DIAGNOSIS — M25422 Effusion, left elbow: Secondary | ICD-10-CM | POA: Diagnosis not present

## 2018-08-03 DIAGNOSIS — M1A09X0 Idiopathic chronic gout, multiple sites, without tophus (tophi): Secondary | ICD-10-CM | POA: Diagnosis not present

## 2018-08-03 DIAGNOSIS — G8929 Other chronic pain: Secondary | ICD-10-CM

## 2018-08-03 MED ORDER — KETOROLAC TROMETHAMINE 30 MG/ML IJ SOLN
30.0000 mg | Freq: Once | INTRAMUSCULAR | Status: AC
  Administered 2018-08-03: 30 mg via INTRAMUSCULAR

## 2018-08-03 MED ORDER — MELOXICAM 15 MG PO TABS: 15.0000 mg | ORAL_TABLET | Freq: Every day | ORAL | 1 refills | Status: DC | PRN

## 2018-08-03 MED ORDER — OXYCODONE HCL 5 MG PO TABS: 5.0000 mg | ORAL_TABLET | Freq: Four times a day (QID) | ORAL | 0 refills | Status: DC | PRN

## 2018-08-03 NOTE — Patient Instructions (Signed)
?   Infection vs gout flare of elbow bursa. Labs today. toradol shot today. May take meloxicam anti inflammatory refilled today starting tomorrow.  May try oxycodone 5mg  four times a day as needed for next few days for pain, don't mix with hydrocodone.  We will refer you to orthopedist for evaluation.
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
## 2018-08-04 DIAGNOSIS — M25522 Pain in left elbow: Secondary | ICD-10-CM | POA: Insufficient documentation

## 2018-08-04 DIAGNOSIS — M25512 Pain in left shoulder: Secondary | ICD-10-CM | POA: Diagnosis not present

## 2018-08-04 DIAGNOSIS — M25422 Effusion, left elbow: Secondary | ICD-10-CM | POA: Insufficient documentation

## 2018-08-04 LAB — CBC WITH DIFFERENTIAL/PLATELET
Basophils Absolute: 0.2 10*3/uL — ABNORMAL HIGH (ref 0.0–0.1)
Basophils Relative: 1.2 % (ref 0.0–3.0)
Eosinophils Absolute: 0.1 10*3/uL (ref 0.0–0.7)
Eosinophils Relative: 0.9 % (ref 0.0–5.0)
HCT: 46 % (ref 39.0–52.0)
Hemoglobin: 15.6 g/dL (ref 13.0–17.0)
LYMPHS ABS: 2.1 10*3/uL (ref 0.7–4.0)
Lymphocytes Relative: 16.5 % (ref 12.0–46.0)
MCHC: 33.8 g/dL (ref 30.0–36.0)
MCV: 90.1 fl (ref 78.0–100.0)
MONO ABS: 1.5 10*3/uL — AB (ref 0.1–1.0)
Monocytes Relative: 11.6 % (ref 3.0–12.0)
NEUTROS PCT: 69.8 % (ref 43.0–77.0)
Neutro Abs: 9 10*3/uL — ABNORMAL HIGH (ref 1.4–7.7)
Platelets: 185 10*3/uL (ref 150.0–400.0)
RBC: 5.11 Mil/uL (ref 4.22–5.81)
RDW: 14 % (ref 11.5–15.5)
WBC: 13 10*3/uL — ABNORMAL HIGH (ref 4.0–10.5)

## 2018-08-04 LAB — URIC ACID: Uric Acid, Serum: 5.8 mg/dL (ref 4.0–7.8)

## 2018-08-04 LAB — SEDIMENTATION RATE: Sed Rate: 24 mm/hr — ABNORMAL HIGH (ref 0–20)

## 2018-08-04 NOTE — Assessment & Plan Note (Addendum)
Known h/o this on probenecid 250mg  bid and colchicine 0.6mg  daily with meloxicam PRN flares. Just completing 10d prednisone taper for presumed L knee gout flare. Has not tolerated xanthine oxidase inhibitors. Consider fenofibrate for uricosuric effect.

## 2018-08-04 NOTE — Assessment & Plan Note (Addendum)
Pain, redness, swelling and warmth localized to L olecranon bursa. ?gout vs infectious cause. Will check labs and treat for presumed gout with meloxicam and oxycodone 5mg  PRN pain (in place of regular hydrocodone 10/325mg  regimen). Will refer to ortho to further eval for septic bursitis. Reassuringly no systemic symptoms besides nausea from pain.  Treat with toradol 30mg  IM today, start meloxicam tomorrow.

## 2018-08-04 NOTE — Assessment & Plan Note (Signed)
[place] CSRS reviewed  ?

## 2018-08-06 ENCOUNTER — Encounter: Payer: Self-pay | Admitting: Family Medicine

## 2018-08-09 DIAGNOSIS — M25512 Pain in left shoulder: Secondary | ICD-10-CM | POA: Diagnosis not present

## 2018-08-10 ENCOUNTER — Other Ambulatory Visit: Payer: Self-pay | Admitting: Family Medicine

## 2018-08-13 ENCOUNTER — Other Ambulatory Visit: Payer: Self-pay | Admitting: Family Medicine

## 2018-08-15 NOTE — Telephone Encounter (Signed)
Name of Medication: Hydrocodone Name of Pharmacy: [facility] Last Fill or Written Date and Quantity: 07/14/18 #90 Last Office Visit and Type: 08/03/2018 Next Office Visit and Type: 07/11/19 AMW Last Controlled Substance Agreement Date: 09/29/16 Last UDS:09/29/16

## 2018-08-17 MED ORDER — HYDROCODONE-ACETAMINOPHEN 10-325 MG PO TABS: 1.0000 | ORAL_TABLET | Freq: Four times a day (QID) | ORAL | 0 refills | Status: DC | PRN

## 2018-08-17 NOTE — Telephone Encounter (Signed)
Eprescribed.

## 2018-08-20 NOTE — Progress Notes (Signed)
I reviewed health advisor's note, was available for consultation, and agree with documentation and plan.  

## 2018-09-01 ENCOUNTER — Other Ambulatory Visit: Payer: Self-pay | Admitting: Family Medicine

## 2018-09-12 ENCOUNTER — Other Ambulatory Visit: Payer: Self-pay | Admitting: Cardiovascular Disease

## 2018-09-16 ENCOUNTER — Other Ambulatory Visit: Payer: Self-pay | Admitting: Family Medicine

## 2018-09-16 MED ORDER — HYDROCODONE-ACETAMINOPHEN 10-325 MG PO TABS: 1.0000 | ORAL_TABLET | Freq: Four times a day (QID) | ORAL | 0 refills | Status: DC | PRN

## 2018-09-16 NOTE — Telephone Encounter (Signed)
Name of Medication: Hydrocodone-APAP Name of Pharmacy: [facility] Last Fill or Written Date and Quantity: 08/17/18, #90 Last Office Visit and Type: 08/13/18, chronic pain f/u Next Office Visit and Type: 07/14/19, CPE Last Controlled Substance Agreement Date: 09/29/16 Last UDS: 09/29/16

## 2018-09-16 NOTE — Telephone Encounter (Signed)
Eprescribed.

## 2018-10-11 ENCOUNTER — Other Ambulatory Visit: Payer: Self-pay | Admitting: Family Medicine

## 2018-10-12 MED ORDER — HYDROCODONE-ACETAMINOPHEN 10-325 MG PO TABS: 1.0000 | ORAL_TABLET | Freq: Four times a day (QID) | ORAL | 0 refills | Status: DC | PRN

## 2018-10-12 NOTE — Telephone Encounter (Signed)
Eprescribed.

## 2018-10-13 ENCOUNTER — Other Ambulatory Visit: Payer: Self-pay | Admitting: Family Medicine

## 2018-10-14 MED ORDER — PREDNISONE 20 MG PO TABS: ORAL_TABLET | ORAL | 0 refills | Status: DC

## 2018-10-14 NOTE — Telephone Encounter (Signed)
Sent in

## 2018-11-09 ENCOUNTER — Other Ambulatory Visit: Payer: Self-pay | Admitting: Family Medicine

## 2018-11-09 ENCOUNTER — Other Ambulatory Visit: Payer: Self-pay | Admitting: Cardiovascular Disease

## 2018-11-10 NOTE — Telephone Encounter (Addendum)
Lynn left v/m requesting refill colchicine. [facility]. Request cb when refilled.

## 2018-11-11 MED ORDER — HYDROCODONE-ACETAMINOPHEN 10-325 MG PO TABS: 1.0000 | ORAL_TABLET | Freq: Four times a day (QID) | ORAL | 0 refills | Status: DC | PRN

## 2018-11-11 NOTE — Telephone Encounter (Signed)
Name of [place]  Name of Pharmacy: [facility] or Written Date and Quantity: 10/12/18 #90 Last Office Visit and Type: 08/03/18 Next Office Visit and Type: 07/14/19 AMW Last Controlled Substance Agreement Date: 09/29/16 Last UDS: 09/29/16

## 2018-11-11 NOTE — Telephone Encounter (Signed)
Eprescribed.

## 2018-11-14 NOTE — Telephone Encounter (Signed)
Pt called back regarding colchicine rx  [facility]   Bet number [phone]  Pt out of his med

## 2018-11-15 ENCOUNTER — Encounter: Payer: Self-pay | Admitting: Family Medicine

## 2018-11-15 NOTE — Telephone Encounter (Signed)
Sent in

## 2018-11-15 NOTE — Telephone Encounter (Signed)
Last refill 11/17/17 #90/3 Last office visit 08/03/18

## 2018-12-04 NOTE — Progress Notes (Signed)
Virtual Visit via Video Note   This visit type was conducted due to national recommendations for restrictions regarding the COVID-19 Pandemic (e.g. social distancing) in an effort to limit this patient's exposure and mitigate transmission in our community.  Due to his co-morbid illnesses, this patient is at least at moderate risk for complications without adequate follow up.  This format is felt to be most appropriate for this patient at this time.  All issues noted in this document were discussed and addressed.  A limited physical exam was performed with this format.  Please refer to the patient's chart for his consent to telehealth for [facility].   I connected with  Randy Kennedy on 12/06/18 by a video enabled telemedicine application and verified that I am speaking with the correct person using two identifiers. I discussed the limitations of evaluation and management by telemedicine. The patient expressed understanding and agreed to proceed.   Evaluation Performed:  Follow-up visit  Date:  12/06/2018   ID:  Randy Kennedy, DOB 07/26/1951, MRN [id number]  Patient Location:  [address] [place] [address]   Provider location:   [facility], [place] office  PCP:  [name], MD  Cardiologist:  Arvid Right [facility]   Chief Complaint:  Chest pain, SOB   History of Present Illness:    Randy Kennedy is a 68 y.o. male who presents via audio/video conferencing for a telehealth visit today.   The patient does not symptoms concerning for COVID-19 infection (fever, chills, cough, or new SHORTNESS OF BREATH).   Patient has a past medical history of RLS,  chronic burning in his feet/neuropathy, Chronic legs and arm numbness and tingling.  hypertension, medication intolerances hyperlipidemia,  OSA  ,not been using his bipap/cpap,  Episodes of atypical chest tightness.  Chronic SOB  Who presents for previous history of bradycardia,   Hypertension  chest pain  January 2020 diagnosed with bursitis in the arm On colchicine  takes losartan 100mg  daily and hydralazine 100mg  1.5 tabs TID. Forgets mid day dose.  Progressive dyspnea even at rest. Denies significant chest pain.   Recent blood pressure measurements at home 217/97, old cuff, 7 years 190/97, cuff 68 yr old,   Better in office visits 150/80 over the past year  Long discussion concerning medication intolerances Headaches, imdur,  Stopped cardura, 4 mg twice daily may have caused a rash Chlorthalidone causes gout Ca channel blocker: Amlodipine and Procardia leg swelling Clonidine /b-blocker: bradycardia spironolactone  Echo 06/11/2017 Normal EF, elevated RVSP  Long history of bradycardia, still asymptomatic no regular exercise  Lab work reviewed LDL 90  other past medical history  Reports that he had a negative stress test in 2013. This was a pharmacologic Myoview dated May 2013. He was unable to treadmill He was seen previously by cardiology in [place]. Notes indicate history of difficult to control blood pressure, hyperlipidemia, obesity, disabled. No smoking history He was started several years on Procardia XL 30 mg daily. This was increased in [place]  Previous episode of chest tightness while he was swimming in a pool. He had to hold onto the side of the pole. Mild dizziness.   Notes from prior cardiologist indicate previous echocardiogram showing diastolic dysfunction    Prior CV studies:   The following studies were reviewed today:  Echocardiogram November 2018 Left ventricle: The cavity size was normal. There was moderate   concentric hypertrophy. Systolic function was normal. The   estimated ejection  fraction was in the range of 55% to 60%. Wall   motion was normal; there were no regional wall motion   abnormalities. Features are consistent with a pseudonormal left   ventricular filling pattern, with concomitant abnormal  relaxation   and increased filling pressure (grade 2 diastolic dysfunction). - Aortic valve: There was trivial regurgitation. - Mitral valve: There was mild regurgitation. - Left atrium: The atrium was mildly dilated. - Right atrium: The atrium was moderately dilated. - Pulmonary arteries: Systolic pressure was moderately increased.   PA peak pressure: 48 mm Hg (S).     Past Medical History:  Diagnosis Date  . Allergic rhinitis   . Arthritis   . Chest pain    a. 11/2011 reportedly normal stress test performed in [place].  . Chronic pain    L ankle and bilateral hips (remote fracture s/p surgeries), lower back (told has herniated disc)  . Closed avulsion fracture of right talus 06/2016  . Eczema   . Erectile dysfunction 09/14/2012  . Gilbert disease   . Gout 2007  . Hearing loss    otosclerosis  . Heartburn    mild, controlled with pepcid  . History of chicken pox   . 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 HLD (hyperlipidemia)   . HTN (hypertension)    difficult to control - clonidine and beta blockers caused bradycardia  . Morbid obesity ([place])   . 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 OSA (obstructive sleep apnea) 11/2011 sleep study   a. moderate, AHI 37.4 increased to 80 in REM, on BiPAP 14/10, 97% compliance >4 hrs (06/2013); b. Does not tolerate CPAP.  Marland Kitchen RLS (restless legs syndrome)   . 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 Treated with vicodin, prior tried fentanyl and butrans.  . Systolic murmur    Past Surgical History:  Procedure Laterality Date  . COLONOSCOPY  02/2011   ext hem, benign polyp, rpt 5 yrs ([place])  . LEG SURGERY  x5   left - after fall at work (3.5 stories)  . MANDIBLE SURGERY  1965   jaw fracture - horse kick  . TYMPANIC MEMBRANE REPAIR Left  1994   otosclerosis     Current Meds  Medication Sig  . colchicine 0.6 MG tablet TAKE 1 TABLET BY MOUTH EVERY DAY  . diclofenac sodium (VOLTAREN) 1 % GEL Apply 2 g topically 3 (three) times daily as needed (joint pains).  . gabapentin (NEURONTIN) 300 MG capsule Take 1 capsule (300 mg total) by mouth 2 (two) times daily.  . hydrALAZINE (APRESOLINE) 100 MG tablet TAKE ONE AND ONE-HALF TABLET BY MOUTH AS DIRECTED THREE TIMES DAILY. MAY INCREASE TO FOUR TIMES DAILY IF NEEDED FOR HIGH BLOOD PRESSURE  . HYDROcodone-acetaminophen (NORCO) 10-325 MG tablet Take 1 tablet by mouth every 6 (six) hours as needed.  . loratadine (CLARITIN) 10 MG tablet Take 1 tablet (10 mg total) by mouth daily.  Marland Kitchen losartan (COZAAR) 100 MG tablet TAKE 1 TABLET BY MOUTH ONCE DAILY  . meloxicam (MOBIC) 15 MG tablet Take 1 tablet (15 mg total) by mouth daily as needed (For gout flare).  . nitroGLYCERIN (NITROSTAT) 0.4 MG SL tablet Place 1 tablet (0.4 mg total) under the tongue every 5 (five) minutes as needed for  chest pain.  Marland Kitchen oxyCODONE (OXY IR/ROXICODONE) 5 MG immediate release tablet Take 1 tablet (5 mg total) by mouth every 6 (six) hours as needed for severe pain.  . pravastatin (PRAVACHOL) 40 MG tablet TAKE 1 TABLET BY MOUTH AT BEDTIME. NEEDS ANNUAL WELLNESS APPOINTMENT FOR FURTHER REFILLS.  Marland Kitchen predniSONE (DELTASONE) 20 MG tablet Take two tablets daily for 5 days followed by one tablet daily for 5 days  . probenecid (BENEMID) 500 MG tablet TAKE 1/2 (ONE-HALF) TABLET BY MOUTH TWICE DAILY  . vitamin C (ASCORBIC ACID) 500 MG tablet Take 500 mg by mouth daily.     Allergies:   Allopurinol; Lisinopril-hydrochlorothiazide; Procardia [nifedipine]; Spironolactone; Amlodipine; Doxazosin mesylate; Nortriptyline; and Uloric [febuxostat]   Social History   Tobacco Use  . Smoking status: Former Smoker    Packs/day: 1.00    Years: 4.00    Pack years: 4.00    Types: Pipe    Last attempt to quit: 07/27/1978    Years since quitting:  40.3  . Smokeless tobacco: Never Used  Substance Use Topics  . Alcohol use: No    Alcohol/week: 0.0 standard drinks  . Drug use: No     Current Outpatient Medications on File Prior to Visit  Medication Sig Dispense Refill  . colchicine 0.6 MG tablet TAKE 1 TABLET BY MOUTH EVERY DAY 90 tablet 1  . diclofenac sodium (VOLTAREN) 1 % GEL Apply 2 g topically 3 (three) times daily as needed (joint pains). 100 g 3  . gabapentin (NEURONTIN) 300 MG capsule Take 1 capsule (300 mg total) by mouth 2 (two) times daily. 60 capsule 3  . hydrALAZINE (APRESOLINE) 100 MG tablet TAKE ONE AND ONE-HALF TABLET BY MOUTH AS DIRECTED THREE TIMES DAILY. MAY INCREASE TO FOUR TIMES DAILY IF NEEDED FOR HIGH BLOOD PRESSURE 180 tablet 0  . HYDROcodone-acetaminophen (NORCO) 10-325 MG tablet Take 1 tablet by mouth every 6 (six) hours as needed. 90 tablet 0  . loratadine (CLARITIN) 10 MG tablet Take 1 tablet (10 mg total) by mouth daily.    Marland Kitchen losartan (COZAAR) 100 MG tablet TAKE 1 TABLET BY MOUTH ONCE DAILY 90 tablet 3  . meloxicam (MOBIC) 15 MG tablet Take 1 tablet (15 mg total) by mouth daily as needed (For gout flare). 30 tablet 1  . nitroGLYCERIN (NITROSTAT) 0.4 MG SL tablet Place 1 tablet (0.4 mg total) under the tongue every 5 (five) minutes as needed for chest pain. 25 tablet 3  . oxyCODONE (OXY IR/ROXICODONE) 5 MG immediate release tablet Take 1 tablet (5 mg total) by mouth every 6 (six) hours as needed for severe pain. 15 tablet 0  . pravastatin (PRAVACHOL) 40 MG tablet TAKE 1 TABLET BY MOUTH AT BEDTIME. NEEDS ANNUAL WELLNESS APPOINTMENT FOR FURTHER REFILLS. 90 tablet 3  . predniSONE (DELTASONE) 20 MG tablet Take two tablets daily for 5 days followed by one tablet daily for 5 days 15 tablet 0  . probenecid (BENEMID) 500 MG tablet TAKE 1/2 (ONE-HALF) TABLET BY MOUTH TWICE DAILY 90 tablet 1  . vitamin C (ASCORBIC ACID) 500 MG tablet Take 500 mg by mouth daily.     No current facility-administered medications on file  prior to visit.      Family Hx: The patient's family history includes Cancer in his father, mother, and paternal uncle; Hypertension in his mother. There is no history of Diabetes, Stroke, or CAD.  ROS:   Please see the history of present illness.    Review of Systems  Constitutional: Negative.  Respiratory: Negative.   Cardiovascular: Negative.   Gastrointestinal: Negative.   Musculoskeletal: Negative.   Neurological: Negative.        Numb and tingling  Psychiatric/Behavioral: Negative.   All other systems reviewed and are negative.    Labs/Other Tests and Data Reviewed:    Recent Labs: 07/01/2018: ALT 28; BUN 27; Creatinine, Ser 1.34; Potassium 4.3; Sodium 140 08/03/2018: Hemoglobin 15.6; Platelets 185.0   Recent Lipid Panel Lab Results  Component Value Date/Time   CHOL 173 07/01/2018 09:35 AM   CHOL 130 02/09/2012   TRIG 220.0 (H) 07/01/2018 09:35 AM   TRIG 142 02/09/2012   HDL 63.40 07/01/2018 09:35 AM   CHOLHDL 3 07/01/2018 09:35 AM   LDLCALC 77 08/07/2015 08:18 AM   LDLCALC 47 02/09/2012   LDLDIRECT 90.0 07/01/2018 09:35 AM    Wt Readings from Last 3 Encounters:  08/03/18 231 lb 4 oz (104.9 kg)  07/05/18 235 lb 12 oz (106.9 kg)  07/01/18 231 lb (104.8 kg)     Exam:    Vital Signs: Vital signs may also be detailed in the HPI There were no vitals taken for this visit.  Wt Readings from Last 3 Encounters:  08/03/18 231 lb 4 oz (104.9 kg)  07/05/18 235 lb 12 oz (106.9 kg)  07/01/18 231 lb (104.8 kg)   Temp Readings from Last 3 Encounters:  08/03/18 98.8 F (37.1 C) (Oral)  07/05/18 97.8 F (36.6 C) (Oral)  07/01/18 98.1 F (36.7 C) (Oral)   BP Readings from Last 3 Encounters:  08/03/18 (!) 154/80  07/05/18 (!) 170/82  07/01/18 (!) 152/74   Pulse Readings from Last 3 Encounters:  08/03/18 (!) 58  07/05/18 (!) 57  07/01/18 67    190/97, cuff 68 yr old,  Pulse 50s, resp 8  Well nourished, well developed male in no acute  distress. Constitutional:  oriented to person, place, and time. No distress.  Head: Normocephalic and atraumatic.  Eyes:  no discharge. No scleral icterus.  Neck: Normal range of motion. Neck supple.  Pulmonary/Chest: No audible wheezing, no distress, appears comfortable Musculoskeletal: Normal range of motion.  no  tenderness or deformity.  Neurological:   Coordination normal. Full exam not performed Skin:  No rash Psychiatric:  normal mood and affect. behavior is normal. Thought content normal.    ASSESSMENT & PLAN:    Morbid obesity ([place]) We have encouraged continued exercise, careful diet management in an effort to lose weight.  Essential hypertension Recommend he start Lasix 20 mg twice a day given elevated pressures on previous echocardiogram and shortness of breath symptoms as well as high blood pressure We will not do higher doses secondary to prior history of gout on chlorthalidone  -He is willing to retry Cardura 2 mg daily with slow titration up to 2 mg twice daily Limited options after extensive review of his blood pressure medications  OSA (obstructive sleep apnea) Not on cpap Some daytime somnolence  Mixed hyperlipidemia Numbers discussed with him, recommended dietary changes, walking program  Chronic fatigue Not wearing his CPAP at night no regular exercise  RLS (restless legs syndrome) Chronic stable sx  SOB (shortness of breath) Likely from deconditioning, obesity, will try Lasix 20 mg twice a week for blood pressure and elevated right heart pressures Elevated pressures likely from sleep apnea without CPAP, systolic dysfunction  [id number] Education: The signs and symptoms of COVID-19 were discussed with the patient and how to seek care for testing (follow up with PCP or arrange E-visit).  The importance of social distancing was discussed today.  Patient Risk:   After full review of this patients clinical status, I feel that they are at least moderate risk  at this time.  Time:   Today, I have spent 25 minutes with the patient with telehealth technology discussing the cardiac and medical problems/diagnoses detailed above   10 min spent reviewing the chart prior to patient visit today   Medication Adjustments/Labs and Tests Ordered: Current medicines are reviewed at length with the patient today.  Concerns regarding medicines are outlined above.   Tests Ordered: No tests ordered   Medication Changes: No changes made   Disposition: Follow-up in 6 months   Signed, Ida Rogue, MD  12/06/2018 8:00 AM    [place] Office [address] [place] [id number], [place], [place] [address]

## 2018-12-06 ENCOUNTER — Other Ambulatory Visit: Payer: Self-pay

## 2018-12-06 ENCOUNTER — Telehealth: Payer: Self-pay

## 2018-12-06 ENCOUNTER — Telehealth (INDEPENDENT_AMBULATORY_CARE_PROVIDER_SITE_OTHER): Payer: PPO | Admitting: Cardiovascular Disease

## 2018-12-06 ENCOUNTER — Telehealth: Payer: Self-pay | Admitting: Cardiovascular Disease

## 2018-12-06 DIAGNOSIS — G2581 Restless legs syndrome: Secondary | ICD-10-CM

## 2018-12-06 DIAGNOSIS — R0602 Shortness of breath: Secondary | ICD-10-CM

## 2018-12-06 DIAGNOSIS — I10 Essential (primary) hypertension: Secondary | ICD-10-CM | POA: Diagnosis not present

## 2018-12-06 DIAGNOSIS — G4733 Obstructive sleep apnea (adult) (pediatric): Secondary | ICD-10-CM | POA: Diagnosis not present

## 2018-12-06 DIAGNOSIS — E782 Mixed hyperlipidemia: Secondary | ICD-10-CM

## 2018-12-06 DIAGNOSIS — R5382 Chronic fatigue, unspecified: Secondary | ICD-10-CM | POA: Diagnosis not present

## 2018-12-06 MED ORDER — FUROSEMIDE 20 MG PO TABS: 20.0000 mg | ORAL_TABLET | ORAL | 3 refills | Status: DC

## 2018-12-06 MED ORDER — DOXAZOSIN MESYLATE 2 MG PO TABS: 2.0000 mg | ORAL_TABLET | Freq: Two times a day (BID) | ORAL | 3 refills | Status: DC

## 2018-12-06 MED ORDER — POTASSIUM CHLORIDE CRYS ER 20 MEQ PO TBCR: 20.0000 meq | EXTENDED_RELEASE_TABLET | ORAL | 3 refills | Status: DC

## 2018-12-06 NOTE — Patient Instructions (Addendum)
Medication Instructions:  Your physician has recommended you make the following change in your medication:  1. START Doxazosin (Cardura) 2 mg 1 tablet twice a day 2. START Furosemide 20 mg 1 tablet on Monday and Friday 3. START Potassium 20 mEq 1 tablet on Monday and Friday  If you need a refill on your cardiac medications before your next appointment, please call your pharmacy.    Lab work: No new labs needed   If you have labs (blood work) drawn today and your tests are completely normal, you will receive your results only by: Marland Kitchen MyChart Message (if you have MyChart) OR . A paper copy in the mail If you have any lab test that is abnormal or we need to change your treatment, we will call you to review the results.   Testing/Procedures: No new testing needed   Follow-Up: At [facility], you and your health needs are our priority.  As part of our continuing mission to provide you with exceptional heart care, we have created designated Provider Care Teams.  These Care Teams include your primary Cardiologist (physician) and Advanced Practice Providers (APPs -  Physician Assistants and Nurse Practitioners) who all work together to provide you with the care you need, when you need it.  . You will need a follow up appointment in 12 months .   Please call our office 2 months in advance to schedule this appointment.    . Providers on your designated Care Team:   . Murray Hodgkins, NP . Christell Faith, PA-C . Marrianne Mood, PA-C  Any Other Special Instructions Will Be Listed Below (If Applicable).  For educational health videos Log in to : [email or web address] Or : [email or web address], password : [email or web address]

## 2018-12-06 NOTE — Telephone Encounter (Signed)

## 2018-12-06 NOTE — Telephone Encounter (Signed)
Left voicemail message to confirm appointment information, consent, and review medications.

## 2018-12-06 NOTE — Telephone Encounter (Signed)
Patient had virtual visit with provider this morning.

## 2018-12-07 ENCOUNTER — Other Ambulatory Visit: Payer: Self-pay | Admitting: Family Medicine

## 2018-12-07 NOTE — Telephone Encounter (Signed)
Last office visit 08/03/2018 for elbow pain/gout/pain management.  Last refilled 11/11/2018 for #90 with no refills.  UDS/Contract 09/30/2016.  CPE scheduled for 07/14/2019.

## 2018-12-08 MED ORDER — HYDROCODONE-ACETAMINOPHEN 10-325 MG PO TABS: 1.0000 | ORAL_TABLET | Freq: Four times a day (QID) | ORAL | 0 refills | Status: DC | PRN

## 2018-12-08 NOTE — Telephone Encounter (Signed)
Eprescribed.

## 2018-12-16 ENCOUNTER — Other Ambulatory Visit: Payer: Self-pay | Admitting: Family Medicine

## 2018-12-22 ENCOUNTER — Telehealth: Payer: Self-pay

## 2018-12-22 MED ORDER — HYDRALAZINE HCL 100 MG PO TABS: ORAL_TABLET | ORAL | 0 refills | Status: DC

## 2018-12-22 NOTE — Telephone Encounter (Signed)
Requested Prescriptions   Signed Prescriptions Disp Refills  . hydrALAZINE (APRESOLINE) 100 MG tablet 180 tablet 0    Sig: TAKE ONE AND ONE-HALF TABLET BY MOUTH AS DIRECTED THREE TIMES DAILY. MAY INCREASE TO FOUR TIMES DAILY IF NEEDED FOR HIGH BLOOD PRESSURE    Authorizing Provider: Minna Merritts    Ordering User: Raelene Bott, BRANDY L

## 2019-01-03 ENCOUNTER — Other Ambulatory Visit: Payer: Self-pay | Admitting: Family Medicine

## 2019-01-03 DIAGNOSIS — G8929 Other chronic pain: Secondary | ICD-10-CM

## 2019-01-04 NOTE — Telephone Encounter (Signed)
Name of Medication: hydrocodone apap 10-325 mg Name of Pharmacy: [facility] or Written Date and Quantity:# 71 on 12/08/18 Last Office Visit and Type:08/03/18 elbow pain  Next Office Visit and Type:07/14/19 annual  Last Controlled Substance Agreement Date: 09/29/16 Last UDS:09/30/16

## 2019-01-05 MED ORDER — HYDROCODONE-ACETAMINOPHEN 10-325 MG PO TABS: 1.0000 | ORAL_TABLET | Freq: Four times a day (QID) | ORAL | 0 refills | Status: DC | PRN

## 2019-01-05 NOTE — Telephone Encounter (Signed)
E prescribed plz schedule virtual visit for chronic pain f/u.

## 2019-02-01 ENCOUNTER — Other Ambulatory Visit: Payer: Self-pay | Admitting: *Deleted

## 2019-02-01 MED ORDER — HYDRALAZINE HCL 100 MG PO TABS: ORAL_TABLET | ORAL | 3 refills | Status: DC

## 2019-02-01 NOTE — Telephone Encounter (Signed)
Lvm for pt to call us back and schedule chronic pain f/u.

## 2019-02-03 ENCOUNTER — Other Ambulatory Visit: Payer: Self-pay | Admitting: Family Medicine

## 2019-02-06 NOTE — Telephone Encounter (Signed)
Name of Medication: Hydrocodone-APAP Name of Pharmacy: [facility] Last Fill or Written Date and Quantity: 01/05/19, #90 Last Office Visit and Type: 08/03/18, pain f/u Next Office Visit and Type: 07/14/19, CPE Pt 2 Last Controlled Substance Agreement Date: 09/29/16 Last UDS: 09/29/16

## 2019-02-07 NOTE — Telephone Encounter (Signed)
bestnumber [phone]  Pt calling checking on rx Pt is out of meds

## 2019-02-08 MED ORDER — HYDROCODONE-ACETAMINOPHEN 10-325 MG PO TABS: 1.0000 | ORAL_TABLET | Freq: Four times a day (QID) | ORAL | 0 refills | Status: DC | PRN

## 2019-02-08 NOTE — Telephone Encounter (Signed)
Eprescribed.

## 2019-03-02 NOTE — Telephone Encounter (Signed)
Pt scheduled 03/07/19 @ 11am

## 2019-03-07 ENCOUNTER — Other Ambulatory Visit: Payer: Self-pay

## 2019-03-07 ENCOUNTER — Other Ambulatory Visit: Payer: Self-pay | Admitting: Family Medicine

## 2019-03-07 ENCOUNTER — Encounter: Payer: Self-pay | Admitting: Family Medicine

## 2019-03-07 ENCOUNTER — Telehealth (INDEPENDENT_AMBULATORY_CARE_PROVIDER_SITE_OTHER): Payer: PPO | Admitting: Family Medicine

## 2019-03-07 VITALS — BP 115/65 | HR 40 | Ht 67.0 in | Wt 190.0 lb

## 2019-03-07 DIAGNOSIS — I10 Essential (primary) hypertension: Secondary | ICD-10-CM | POA: Diagnosis not present

## 2019-03-07 DIAGNOSIS — M1A09X0 Idiopathic chronic gout, multiple sites, without tophus (tophi): Secondary | ICD-10-CM

## 2019-03-07 DIAGNOSIS — G90522 Complex regional pain syndrome I of left lower limb: Secondary | ICD-10-CM | POA: Diagnosis not present

## 2019-03-07 DIAGNOSIS — E663 Overweight: Secondary | ICD-10-CM | POA: Diagnosis not present

## 2019-03-07 DIAGNOSIS — R001 Bradycardia, unspecified: Secondary | ICD-10-CM | POA: Diagnosis not present

## 2019-03-07 DIAGNOSIS — G8929 Other chronic pain: Secondary | ICD-10-CM | POA: Diagnosis not present

## 2019-03-07 DIAGNOSIS — F1120 Opioid dependence, uncomplicated: Secondary | ICD-10-CM

## 2019-03-07 MED ORDER — HYDROCODONE-ACETAMINOPHEN 10-325 MG PO TABS: 1.0000 | ORAL_TABLET | Freq: Four times a day (QID) | ORAL | 0 refills | Status: DC | PRN

## 2019-03-07 NOTE — Assessment & Plan Note (Addendum)
[place] CSRS reviewed.  Stable period on current regimen - continue hydrocodone and gabapentin.  Update UDS when he returns on Friday

## 2019-03-07 NOTE — Assessment & Plan Note (Signed)
Congratulated on significant weight loss noted! He endorses significant sustainable healthy diet changes over the last 3 months. Motivated to continue - states his goal weight is 165lbs.

## 2019-03-07 NOTE — Assessment & Plan Note (Addendum)
Stable period on probenecid and colchicine daily along with meloxicam PRN gout flares. Discussed how weight loss is likely helping control gout.

## 2019-03-07 NOTE — Assessment & Plan Note (Signed)
More marked today. Will watch for symptoms.

## 2019-03-07 NOTE — Assessment & Plan Note (Signed)
Significant improvement noted today - states BP normally running 140/70s. Will come in for labwork this Friday. Again congratulated on weight loss.
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
## 2019-03-08 NOTE — Telephone Encounter (Signed)
Spoke with [facility] asking about rx.  States it is filled and ready.  Spoke with pt notifying him the hydrocodone is ready at [facility] to pick up.

## 2019-03-10 ENCOUNTER — Other Ambulatory Visit (INDEPENDENT_AMBULATORY_CARE_PROVIDER_SITE_OTHER): Payer: PPO

## 2019-03-10 DIAGNOSIS — I10 Essential (primary) hypertension: Secondary | ICD-10-CM

## 2019-03-10 DIAGNOSIS — G8929 Other chronic pain: Secondary | ICD-10-CM

## 2019-03-10 LAB — BASIC METABOLIC PANEL
BUN: 21 mg/dL (ref 6–23)
CO2: 30 mEq/L (ref 19–32)
Calcium: 9.1 mg/dL (ref 8.4–10.5)
Chloride: 101 mEq/L (ref 96–112)
Creatinine, Ser: 1.16 mg/dL (ref 0.40–1.50)
GFR: 62.56 mL/min (ref >60.00)
Glucose, Bld: 128 mg/dL — ABNORMAL HIGH (ref 70–99)
Potassium: 3.9 mEq/L (ref 3.5–5.1)
Sodium: 139 mEq/L (ref 135–145)

## 2019-03-13 LAB — PAIN MGMT, PROFILE 8 W/CONF, U
6 Acetylmorphine: NEGATIVE ng/mL
Alcohol Metabolites: NEGATIVE ng/mL (ref <500)
Amphetamines: NEGATIVE ng/mL
Benzodiazepines: NEGATIVE ng/mL
Buprenorphine, Urine: NEGATIVE ng/mL
Buprenorphine: NEGATIVE ng/mL
Cocaine Metabolite: NEGATIVE ng/mL
Codeine: NEGATIVE ng/mL
Creatinine: 156.3 mg/dL
Hydrocodone: 915 ng/mL
Hydromorphone: NEGATIVE ng/mL
MDMA: NEGATIVE ng/mL
Marijuana Metabolite: NEGATIVE ng/mL
Morphine: NEGATIVE ng/mL
Norbuprenorphine: NEGATIVE ng/mL
Norhydrocodone: 3218 ng/mL
Opiates: POSITIVE ng/mL
Oxidant: NEGATIVE ug/mL
Oxycodone: NEGATIVE ng/mL
pH: 5.6 (ref 4.5–9.0)

## 2019-03-17 ENCOUNTER — Encounter: Payer: Self-pay | Admitting: Family Medicine

## 2019-03-21 ENCOUNTER — Other Ambulatory Visit: Payer: Self-pay | Admitting: Family Medicine

## 2019-03-21 NOTE — Telephone Encounter (Signed)
LOV 03/07/2019. Future appointment on 07/14/2019. Last filled on 12/16/2018

## 2019-03-21 NOTE — Telephone Encounter (Signed)
Spoke with pt asking if he has seen Dr. [name] message about his labs.  Denied that he had.  I read Dr. [name] message and pt verbalizes understanding.

## 2019-04-04 ENCOUNTER — Other Ambulatory Visit: Payer: Self-pay | Admitting: Family Medicine

## 2019-04-04 ENCOUNTER — Encounter: Payer: Self-pay | Admitting: Family Medicine

## 2019-04-04 NOTE — Telephone Encounter (Signed)
Meloxicam Last rx:  08/03/18, 330 Last OV:  03/07/19, chronic pain f/u Next OV:  07/14/19, CPE Prt 2

## 2019-04-04 NOTE — Telephone Encounter (Signed)
Name of Medication: Hydrocodone-APAP Name of Pharmacy: [facility] Last Fill or Written Date and Quantity: 03/07/19, #90 Last Office Visit and Type: 03/07/19, chronic pain f/u Next Office Visit and Type: 07/14/19, CPE Prt 2 Last Controlled Substance Agreement Date: 09/29/16 Last UDS: 03/10/19

## 2019-04-05 MED ORDER — HYDROCODONE-ACETAMINOPHEN 10-325 MG PO TABS: 1.0000 | ORAL_TABLET | Freq: Four times a day (QID) | ORAL | 0 refills | Status: DC | PRN

## 2019-04-05 NOTE — Telephone Encounter (Signed)
Eprescribed.

## 2019-04-06 ENCOUNTER — Ambulatory Visit (INDEPENDENT_AMBULATORY_CARE_PROVIDER_SITE_OTHER): Payer: PPO

## 2019-04-06 ENCOUNTER — Other Ambulatory Visit: Payer: Self-pay

## 2019-04-06 DIAGNOSIS — Z23 Encounter for immunization: Secondary | ICD-10-CM | POA: Diagnosis not present

## 2019-04-06 MED ORDER — PREDNISONE 20 MG PO TABS: 20.0000 mg | ORAL_TABLET | Freq: Every day | ORAL | 0 refills | Status: DC

## 2019-04-21 ENCOUNTER — Other Ambulatory Visit: Payer: Self-pay | Admitting: Family Medicine

## 2019-05-04 ENCOUNTER — Other Ambulatory Visit: Payer: Self-pay | Admitting: Family Medicine

## 2019-05-04 MED ORDER — HYDROCODONE-ACETAMINOPHEN 10-325 MG PO TABS: 1.0000 | ORAL_TABLET | Freq: Four times a day (QID) | ORAL | 0 refills | Status: DC | PRN

## 2019-05-04 NOTE — Telephone Encounter (Signed)
Last office visit 03/07/2019 for Pain Management.  Last refilled 04/05/2019 for #90 with no refills.  UDS/Contract 03/10/2019.  CPE scheduled for 07/14/2019.

## 2019-05-04 NOTE — Telephone Encounter (Signed)
ERx 

## 2019-05-10 ENCOUNTER — Other Ambulatory Visit: Payer: Self-pay | Admitting: Family Medicine

## 2019-05-31 ENCOUNTER — Other Ambulatory Visit: Payer: Self-pay | Admitting: Family Medicine

## 2019-06-01 NOTE — Telephone Encounter (Signed)
Name of Medication: hydrocodone apap 10-325 mg Name of Pharmacy: [facility] or Written Date and Quantity: # 59 on 05/04/19 Last Office Visit and Type: 03/07/19 pain mgt Next Office Visit and Type: 07/14/19 annual Last Controlled Substance Agreement Date:09/29/2016  Last UDS:03/10/19

## 2019-06-02 MED ORDER — HYDROCODONE-ACETAMINOPHEN 10-325 MG PO TABS: 1.0000 | ORAL_TABLET | Freq: Four times a day (QID) | ORAL | 0 refills | Status: DC | PRN

## 2019-06-02 NOTE — Telephone Encounter (Signed)
ERx 

## 2019-07-02 ENCOUNTER — Other Ambulatory Visit: Payer: Self-pay | Admitting: Family Medicine

## 2019-07-04 NOTE — Telephone Encounter (Signed)
Name of Medication: Hydrocodone-APAP Name of Pharmacy: [facility] Last Fill or Written Date and Quantity: 06/02/19, #90 Last Office Visit and Type: 03/07/19, chronic pain mngmt Next Office Visit and Type: 08/29/19, CPE prt 2 Last Controlled Substance Agreement Date: 09/29/16 Last UDS:  09/29/16

## 2019-07-05 MED ORDER — HYDROCODONE-ACETAMINOPHEN 10-325 MG PO TABS: 1.0000 | ORAL_TABLET | Freq: Four times a day (QID) | ORAL | 0 refills | Status: DC | PRN

## 2019-07-05 NOTE — Telephone Encounter (Signed)
ERx 

## 2019-07-11 ENCOUNTER — Ambulatory Visit: Payer: PPO

## 2019-07-14 ENCOUNTER — Encounter: Payer: PPO | Admitting: Family Medicine

## 2019-07-17 ENCOUNTER — Other Ambulatory Visit: Payer: Self-pay | Admitting: Family Medicine

## 2019-08-03 DIAGNOSIS — J069 Acute upper respiratory infection, unspecified: Secondary | ICD-10-CM | POA: Diagnosis not present

## 2019-08-03 DIAGNOSIS — Z20822 Contact with and (suspected) exposure to covid-19: Secondary | ICD-10-CM | POA: Diagnosis not present

## 2019-08-03 DIAGNOSIS — R509 Fever, unspecified: Secondary | ICD-10-CM | POA: Diagnosis not present

## 2019-08-06 ENCOUNTER — Other Ambulatory Visit: Payer: Self-pay | Admitting: Family Medicine

## 2019-08-07 NOTE — Telephone Encounter (Signed)
Name of Medication: Hydrocodone-APAP Name of Pharmacy: [facility] Last Fill or Written Date and Quantity: 07/05/19, #90 Last Office Visit and Type: 02/25/19, chronic pain f/u Next Office Visit and Type: 08/29/19, CPE prt 2 Last Controlled Substance Agreement Date: 09/29/16 Last UDS:  03/10/19

## 2019-08-08 MED ORDER — HYDROCODONE-ACETAMINOPHEN 10-325 MG PO TABS: 1.0000 | ORAL_TABLET | Freq: Four times a day (QID) | ORAL | 0 refills | Status: DC | PRN

## 2019-08-08 NOTE — Telephone Encounter (Signed)
ERx 

## 2019-08-20 ENCOUNTER — Other Ambulatory Visit: Payer: Self-pay | Admitting: Family Medicine

## 2019-08-20 DIAGNOSIS — D696 Thrombocytopenia, unspecified: Secondary | ICD-10-CM

## 2019-08-20 DIAGNOSIS — R739 Hyperglycemia, unspecified: Secondary | ICD-10-CM

## 2019-08-20 DIAGNOSIS — I10 Essential (primary) hypertension: Secondary | ICD-10-CM

## 2019-08-20 DIAGNOSIS — E782 Mixed hyperlipidemia: Secondary | ICD-10-CM

## 2019-08-20 DIAGNOSIS — M1A09X0 Idiopathic chronic gout, multiple sites, without tophus (tophi): Secondary | ICD-10-CM

## 2019-08-20 DIAGNOSIS — Z125 Encounter for screening for malignant neoplasm of prostate: Secondary | ICD-10-CM

## 2019-08-22 ENCOUNTER — Other Ambulatory Visit: Payer: Self-pay

## 2019-08-22 ENCOUNTER — Other Ambulatory Visit (INDEPENDENT_AMBULATORY_CARE_PROVIDER_SITE_OTHER): Payer: PPO

## 2019-08-22 DIAGNOSIS — R739 Hyperglycemia, unspecified: Secondary | ICD-10-CM | POA: Diagnosis not present

## 2019-08-22 DIAGNOSIS — M1A09X0 Idiopathic chronic gout, multiple sites, without tophus (tophi): Secondary | ICD-10-CM | POA: Diagnosis not present

## 2019-08-22 DIAGNOSIS — Z125 Encounter for screening for malignant neoplasm of prostate: Secondary | ICD-10-CM

## 2019-08-22 DIAGNOSIS — E782 Mixed hyperlipidemia: Secondary | ICD-10-CM

## 2019-08-22 DIAGNOSIS — D696 Thrombocytopenia, unspecified: Secondary | ICD-10-CM | POA: Diagnosis not present

## 2019-08-22 LAB — COMPREHENSIVE METABOLIC PANEL
ALT: 17 U/L (ref 0–53)
AST: 18 U/L (ref 0–37)
Albumin: 4.5 g/dL (ref 3.5–5.2)
Alkaline Phosphatase: 76 U/L (ref 39–117)
BUN: 18 mg/dL (ref 6–23)
CO2: 30 mEq/L (ref 19–32)
Calcium: 9.3 mg/dL (ref 8.4–10.5)
Chloride: 102 mEq/L (ref 96–112)
Creatinine, Ser: 1.08 mg/dL (ref 0.40–1.50)
GFR: 67.85 mL/min (ref >60.00)
Glucose, Bld: 97 mg/dL (ref 70–99)
Potassium: 4 mEq/L (ref 3.5–5.1)
Sodium: 141 mEq/L (ref 135–145)
Total Bilirubin: 0.8 mg/dL (ref 0.2–1.2)
Total Protein: 7.2 g/dL (ref 6.0–8.3)

## 2019-08-22 LAB — PSA, MEDICARE: PSA: 0.45 ng/ml (ref 0.10–4.00)

## 2019-08-22 LAB — CBC WITH DIFFERENTIAL/PLATELET
Basophils Absolute: 0 10*3/uL (ref 0.0–0.1)
Basophils Relative: 0.7 % (ref 0.0–3.0)
Eosinophils Absolute: 0.1 10*3/uL (ref 0.0–0.7)
Eosinophils Relative: 2 % (ref 0.0–5.0)
HCT: 42.8 % (ref 39.0–52.0)
Hemoglobin: 14.8 g/dL (ref 13.0–17.0)
Lymphocytes Relative: 36.2 % (ref 12.0–46.0)
Lymphs Abs: 2 10*3/uL (ref 0.7–4.0)
MCHC: 34.5 g/dL (ref 30.0–36.0)
MCV: 91.4 fl (ref 78.0–100.0)
Monocytes Absolute: 0.6 10*3/uL (ref 0.1–1.0)
Monocytes Relative: 10.3 % (ref 3.0–12.0)
Neutro Abs: 2.9 10*3/uL (ref 1.4–7.7)
Neutrophils Relative %: 50.8 % (ref 43.0–77.0)
Platelets: 145 10*3/uL — ABNORMAL LOW (ref 150.0–400.0)
RBC: 4.68 Mil/uL (ref 4.22–5.81)
RDW: 13 % (ref 11.5–15.5)
WBC: 5.6 10*3/uL (ref 4.0–10.5)

## 2019-08-22 LAB — URIC ACID: Uric Acid, Serum: 5.8 mg/dL (ref 4.0–7.8)

## 2019-08-22 LAB — LIPID PANEL
Cholesterol: 176 mg/dL (ref 0–200)
HDL: 87.1 mg/dL (ref >39.00)
LDL Cholesterol: 71 mg/dL (ref 0–99)
NonHDL: 88.92
Total CHOL/HDL Ratio: 2
Triglycerides: 92 mg/dL (ref 0.0–149.0)
VLDL: 18.4 mg/dL (ref 0.0–40.0)

## 2019-08-22 LAB — HEMOGLOBIN A1C: Hgb A1c MFr Bld: 5.3 % (ref 4.6–6.5)

## 2019-08-23 ENCOUNTER — Ambulatory Visit (INDEPENDENT_AMBULATORY_CARE_PROVIDER_SITE_OTHER): Payer: PPO

## 2019-08-23 ENCOUNTER — Other Ambulatory Visit: Payer: Self-pay

## 2019-08-23 DIAGNOSIS — Z0000 Encounter for general adult medical examination without abnormal findings: Secondary | ICD-10-CM

## 2019-08-23 NOTE — Patient Instructions (Signed)
Randy Kennedy , Thank you for taking time to come for your Medicare Wellness Visit. I appreciate your ongoing commitment to your health goals. Please review the following plan we discussed and let me know if I can assist you in the future.   Screening recommendations/referrals: Colonoscopy: Up to date, completed 03/13/2011 Recommended yearly ophthalmology/optometry visit for glaucoma screening and checkup Recommended yearly dental visit for hygiene and checkup  Vaccinations: Influenza vaccine: Up to date, completed 04/06/2019 Pneumococcal vaccine: Completed series Tdap vaccine: Up to date, completed 07/27/2012 Shingles vaccine: discussed    Advanced directives: Advance directive discussed with you today. I have provided a copy for you to complete at home and have notarized. Once this is complete please bring a copy in to our office so we can scan it into your chart  Conditions/risks identified: hypertension, hyperlipidemia  Next appointment: 08/29/2019 @ 8:30 am   Preventive Care 65 Years and Older, Male Preventive care refers to lifestyle choices and visits with your health care provider that can promote health and wellness. What does preventive care include?  A yearly physical exam. This is also called an annual well check.  Dental exams once or twice a year.  Routine eye exams. Ask your health care provider how often you should have your eyes checked.  Personal lifestyle choices, including:  Daily care of your teeth and gums.  Regular physical activity.  Eating a healthy diet.  Avoiding tobacco and drug use.  Limiting alcohol use.  Practicing safe sex.  Taking low doses of aspirin every day.  Taking vitamin and mineral supplements as recommended by your health care provider. What happens during an annual well check? The services and screenings done by your health care provider during your annual well check will depend on your age, overall health, lifestyle risk factors,  and family history of disease. Counseling  Your health care provider may ask you questions about your:  Alcohol use.  Tobacco use.  Drug use.  Emotional well-being.  Home and relationship well-being.  Sexual activity.  Eating habits.  History of falls.  Memory and ability to understand (cognition).  Work and work Statistician. Screening  You may have the following tests or measurements:  Height, weight, and BMI.  Blood pressure.  Lipid and cholesterol levels. These may be checked every 5 years, or more frequently if you are over 33 years old.  Skin check.  Lung cancer screening. You may have this screening every year starting at age 14 if you have a 30-pack-year history of smoking and currently smoke or have quit within the past 15 years.  Fecal occult blood test (FOBT) of the stool. You may have this test every year starting at age 20.  Flexible sigmoidoscopy or colonoscopy. You may have a sigmoidoscopy every 5 years or a colonoscopy every 10 years starting at age 7.  Prostate cancer screening. Recommendations will vary depending on your family history and other risks.  Hepatitis C blood test.  Hepatitis B blood test.  Sexually transmitted disease (STD) testing.  Diabetes screening. This is done by checking your blood sugar (glucose) after you have not eaten for a while (fasting). You may have this done every 1-3 years.  Abdominal aortic aneurysm (AAA) screening. You may need this if you are a current or former smoker.  Osteoporosis. You may be screened starting at age 28 if you are at high risk. Talk with your health care provider about your test results, treatment options, and if necessary, the need for more  tests. Vaccines  Your health care provider may recommend certain vaccines, such as:  Influenza vaccine. This is recommended every year.  Tetanus, diphtheria, and acellular pertussis (Tdap, Td) vaccine. You may need a Td booster every 10  years.  Zoster vaccine. You may need this after age 66.  Pneumococcal 13-valent conjugate (PCV13) vaccine. One dose is recommended after age 62.  Pneumococcal polysaccharide (PPSV23) vaccine. One dose is recommended after age 22. Talk to your health care provider about which screenings and vaccines you need and how often you need them. This information is not intended to replace advice given to you by your health care provider. Make sure you discuss any questions you have with your health care provider. Document Released: 08/09/2015 Document Revised: 04/01/2016 Document Reviewed: 05/14/2015 Elsevier Interactive Patient Education  2017 [place] Prevention in the Home Falls can cause injuries. They can happen to people of all ages. There are many things you can do to make your home safe and to help prevent falls. What can I do on the outside of my home?  Regularly fix the edges of walkways and driveways and fix any cracks.  Remove anything that might make you trip as you walk through a door, such as a raised step or threshold.  Trim any bushes or trees on the path to your home.  Use bright outdoor lighting.  Clear any walking paths of anything that might make someone trip, such as rocks or tools.  Regularly check to see if handrails are loose or broken. Make sure that both sides of any steps have handrails.  Any raised decks and porches should have guardrails on the edges.  Have any leaves, snow, or ice cleared regularly.  Use sand or salt on walking paths during winter.  Clean up any spills in your garage right away. This includes oil or grease spills. What can I do in the bathroom?  Use night lights.  Install grab bars by the toilet and in the tub and shower. Do not use towel bars as grab bars.  Use non-skid mats or decals in the tub or shower.  If you need to sit down in the shower, use a plastic, non-slip stool.  Keep the floor dry. Clean up any water that  spills on the floor as soon as it happens.  Remove soap buildup in the tub or shower regularly.  Attach bath mats securely with double-sided non-slip rug tape.  Do not have throw rugs and other things on the floor that can make you trip. What can I do in the bedroom?  Use night lights.  Make sure that you have a light by your bed that is easy to reach.  Do not use any sheets or blankets that are too big for your bed. They should not hang down onto the floor.  Have a firm chair that has side arms. You can use this for support while you get dressed.  Do not have throw rugs and other things on the floor that can make you trip. What can I do in the kitchen?  Clean up any spills right away.  Avoid walking on wet floors.  Keep items that you use a lot in easy-to-reach places.  If you need to reach something above you, use a strong step stool that has a grab bar.  Keep electrical cords out of the way.  Do not use floor polish or wax that makes floors slippery. If you must use wax, use non-skid floor  wax.  Do not have throw rugs and other things on the floor that can make you trip. What can I do with my stairs?  Do not leave any items on the stairs.  Make sure that there are handrails on both sides of the stairs and use them. Fix handrails that are broken or loose. Make sure that handrails are as long as the stairways.  Check any carpeting to make sure that it is firmly attached to the stairs. Fix any carpet that is loose or worn.  Avoid having throw rugs at the top or bottom of the stairs. If you do have throw rugs, attach them to the floor with carpet tape.  Make sure that you have a light switch at the top of the stairs and the bottom of the stairs. If you do not have them, ask someone to add them for you. What else can I do to help prevent falls?  Wear shoes that:  Do not have high heels.  Have rubber bottoms.  Are comfortable and fit you well.  Are closed at the  toe. Do not wear sandals.  If you use a stepladder:  Make sure that it is fully opened. Do not climb a closed stepladder.  Make sure that both sides of the stepladder are locked into place.  Ask someone to hold it for you, if possible.  Clearly mark and make sure that you can see:  Any grab bars or handrails.  First and last steps.  Where the edge of each step is.  Use tools that help you move around (mobility aids) if they are needed. These include:  Canes.  Walkers.  Scooters.  Crutches.  Turn on the lights when you go into a dark area. Replace any light bulbs as soon as they burn out.  Set up your furniture so you have a clear path. Avoid moving your furniture around.  If any of your floors are uneven, fix them.  If there are any pets around you, be aware of where they are.  Review your medicines with your doctor. Some medicines can make you feel dizzy. This can increase your chance of falling. Ask your doctor what other things that you can do to help prevent falls. This information is not intended to replace advice given to you by your health care provider. Make sure you discuss any questions you have with your health care provider. Document Released: 05/09/2009 Document Revised: 12/19/2015 Document Reviewed: 08/17/2014 Elsevier Interactive Patient Education  2017 [employer].

## 2019-08-23 NOTE — Progress Notes (Signed)
PCP notes:  Health Maintenance: No gaps noted   Abnormal Screenings: none   Patient concerns: Patient complains of ongoing low back and bilateral hip pain.    Nurse concerns: none   Next PCP appt: 08/29/2019 @ 8:30 am

## 2019-08-23 NOTE — Progress Notes (Signed)
Subjective:   TYKESE SALERNO is a 69 y.o. male who presents for Medicare Annual/Subsequent preventive examination.  Review of Systems: N/A   This visit is being conducted through telemedicine via telephone at the nurse health advisor's home address due to the COVID-19 pandemic. This patient has given me verbal consent via doximity to conduct this visit, patient states they are participating from their home address. Patient and myself are on the telephone call. There is no referral for this visit. Some vital signs may be absent or patient reported.    Patient identification: identified by name, DOB, and current address   Cardiac Risk Factors include: advanced age (>59men, >52 women);hypertension;male gender;dyslipidemia     Objective:    Vitals: There were no vitals taken for this visit.  There is no height or weight on file to calculate BMI.  Advanced Directives 08/23/2019 07/01/2018 10/04/2017 08/24/2017  Does Patient Have a Medical Advance Directive? No Yes Yes No  Type of Advance Directive - [place];Living will Living will -  Copy of [place] in Chart? - No - copy requested - -  Would patient like information on creating a medical advance directive? Yes (MAU/Ambulatory/Procedural Areas - Information given) - - No - Patient declined    Tobacco Social History   Tobacco Use  Smoking Status Former Smoker  . Packs/day: 1.00  . Years: 4.00  . Pack years: 4.00  . Types: Pipe  . Quit date: 07/27/1978  . Years since quitting: 41.1  Smokeless Tobacco Never Used     Counseling given: Not Answered   Clinical Intake:  Pre-visit preparation completed: Yes  Pain : 0-10 Pain Score: 9  Pain Type: Chronic pain Pain Location: Back(right hip) Pain Orientation: Right, Lower, Left Pain Descriptors / Indicators: Aching Pain Onset: More than a month ago Pain Frequency: Intermittent Pain Relieving Factors: ibuprofen, pain medications  Pain  Relieving Factors: ibuprofen, pain medications  Nutritional Risks: None Diabetes: No  How often do you need to have someone help you when you read instructions, pamphlets, or other written materials from your doctor or pharmacy?: 1 - Never What is the last grade level you completed in school?: Masters  Interpreter Needed?: No  Information entered by :: CJohnson, LPN  Past Medical History:  Diagnosis Date  . Allergic rhinitis   . Arthritis   . Chest pain    a. 11/2011 reportedly normal stress test performed in [place].  . Chronic pain    L ankle and bilateral hips (remote fracture s/p surgeries), lower back (told has herniated disc)  . Closed avulsion fracture of right talus 06/2016  . Eczema   . Erectile dysfunction 09/14/2012  . Gilbert disease   . Gout 2007  . Hearing loss    otosclerosis  . Heartburn    mild, controlled with pepcid  . History of chicken pox   . 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 HLD (hyperlipidemia)   . HTN (hypertension)    difficult to control - clonidine and beta blockers caused bradycardia  . Morbid obesity ([place])   . 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 OSA (obstructive sleep apnea) 11/2011 sleep study   a. moderate, AHI 37.4 increased to 80 in REM, on BiPAP 14/10, 97% compliance >4 hrs (06/2013); b. Does not tolerate CPAP.  Marland Kitchen RLS (restless legs syndrome)   . 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 Treated with vicodin, prior tried fentanyl and butrans.  . Systolic murmur    Past Surgical History:  Procedure Laterality Date  . COLONOSCOPY  02/2011   ext hem, benign polyp, rpt 5 yrs ([place])  . LEG SURGERY  x5   left - after fall at work (3.5 stories)  . MANDIBLE SURGERY  1965   jaw fracture - horse kick  . TYMPANIC MEMBRANE REPAIR Left 1994     otosclerosis   Family History  Problem Relation Age of Onset  . Cancer Father        prostate  . Cancer Mother        rectal  . Hypertension Mother   . Cancer Paternal Uncle        prostate  . Diabetes Neg Hx   . Stroke Neg Hx   . CAD Neg Hx    Social History   Socioeconomic History  . Marital status: Married    Spouse name: Not on file  . Number of children: Not on file  . Years of education: Not on file  . Highest education level: Not on file  Occupational History  . Not on file  Tobacco Use  . Smoking status: Former Smoker    Packs/day: 1.00    Years: 4.00    Pack years: 4.00    Types: Pipe    Quit date: 07/27/1978    Years since quitting: 41.1  . Smokeless tobacco: Never Used  Substance and Sexual Activity  . Alcohol use: No    Alcohol/week: 0.0 standard drinks  . Drug use: No  . Sexual activity: Not on file  Other Topics Concern  . Not on file  Social History Narrative   Caffeine: 2 cans coke/day   Lives with wife and grown son    Occupation: retired, was Agricultural engineer.   On disability for chronic pain   Edu: MBA   Activity: no regular exercise   Deit: good water, fruits/vegetables daily   Social Determinants of Health   Financial Resource Strain: Low Risk   . Difficulty of Paying Living Expenses: Not hard at all  Food Insecurity: No Food Insecurity  . Worried About Charity fundraiser in the Last Year: Never true  . Ran Out of Food in the Last Year: Never true  Transportation Needs: No Transportation Needs  . Lack of Transportation (Medical): No  . Lack of Transportation (Non-Medical): No  Physical Activity: Inactive  . Days of Exercise per Week: 0 days  . Minutes of Exercise per Session: 0 min  Stress: No Stress Concern Present  . Feeling of Stress : Not at all  Social Connections:   . Frequency of Communication with Friends and Family: Not on file  . Frequency of Social Gatherings with Friends and Family: Not on file  . Attends  Religious Services: Not on file  . Active Member of Clubs or Organizations: Not on file  . Attends Archivist Meetings: Not on file  . Marital Status: Not on file    Outpatient Encounter Medications as of 08/23/2019  Medication Sig  . colchicine 0.6 MG tablet TAKE 1 TABLET BY MOUTH EVERY DAY  . gabapentin (NEURONTIN) 300 MG capsule TAKE 1 CAPSULE BY MOUTH THREE TIMES DAILY  . hydrALAZINE (APRESOLINE) 100 MG tablet TAKE ONE AND ONE-HALF TABLET BY MOUTH AS  DIRECTED THREE TIMES DAILY. MAY INCREASE TO FOUR TIMES DAILY IF NEEDED FOR HIGH BLOOD PRESSURE  . HYDROcodone-acetaminophen (NORCO) 10-325 MG tablet Take 1 tablet by mouth every 6 (six) hours as needed.  . loratadine (CLARITIN) 10 MG tablet Take 1 tablet (10 mg total) by mouth daily.  Marland Kitchen losartan (COZAAR) 100 MG tablet Take 1 tablet by mouth once daily  . meloxicam (MOBIC) 15 MG tablet TAKE 1 TABLET BY MOUTH ONCE DAILY AS NEEDED FOR  GOUT  FLARE  . nitroGLYCERIN (NITROSTAT) 0.4 MG SL tablet Place 1 tablet (0.4 mg total) under the tongue every 5 (five) minutes as needed for chest pain.  . pravastatin (PRAVACHOL) 40 MG tablet TAKE 1 TABLET BY MOUTH AT BEDTIME. NEEDS ANNUAL WELLNESS APPOINTMENT FOR FURTHER REFILLS.  Marland Kitchen predniSONE (DELTASONE) 20 MG tablet Take 1 tablet (20 mg total) by mouth daily. For 1-2 days prn gout flare  . probenecid (BENEMID) 500 MG tablet Take 1/2 (one-half) tablet by mouth twice daily  . vitamin C (ASCORBIC ACID) 500 MG tablet Take 500 mg by mouth daily.  . diclofenac sodium (VOLTAREN) 1 % GEL Apply 2 g topically 3 (three) times daily as needed (joint pains).  . furosemide (LASIX) 20 MG tablet Take 1 tablet (20 mg total) by mouth as directed. Take 1 tablet (20 mg) every Monday and Friday  . potassium chloride SA (K-DUR) 20 MEQ tablet Take 1 tablet (20 mEq total) by mouth as directed. Take 1 tablet (20 mEq) every Monday and Friday   No facility-administered encounter medications on file as of 08/23/2019.     Activities of Daily Living In your present state of health, do you have any difficulty performing the following activities: 08/23/2019  Hearing? Y  Comment some hearing loss  Vision? N  Difficulty concentrating or making decisions? N  Walking or climbing stairs? N  Dressing or bathing? N  Doing errands, shopping? N  Preparing Food and eating ? N  Using the Toilet? N  In the past six months, have you accidently leaked urine? N  Do you have problems with loss of bowel control? N  Managing your Medications? N  Managing your Finances? N  Housekeeping or managing your Housekeeping? N  Some recent data might be hidden    Patient Care Team: [name], MD as PCP - General (Family Medicine)   Assessment:   This is a routine wellness examination for Eliah.  Exercise Activities and Dietary recommendations Current Exercise Habits: The patient does not participate in regular exercise at present, Exercise limited by: orthopedic condition(s)  Goals    . Patient Stated     Starting 07/01/2018, I will continue to take medications as prescribed.     . Patient Stated     08/23/2019, I will maintain and continue medications as prescribed.        Fall Risk Fall Risk  08/23/2019 07/01/2018 01/07/2017 08/14/2015  Falls in the past year? 0 0 Yes No  Number falls in past yr: 0 - 1 -  Injury with Fall? 0 - Yes -  Risk for fall due to : Medication side effect;Impaired balance/gait;Impaired mobility - - -  Follow up Falls evaluation completed;Falls prevention discussed - - -   Is the patient's home free of loose throw rugs in walkways, pet beds, electrical cords, etc?   yes      Grab bars in the bathroom? yes      Handrails on the stairs?   yes      Adequate  lighting?   yes  Timed Get Up and Go Performed: N/A  Depression Screen PHQ 2/9 Scores 08/23/2019 07/01/2018 01/07/2017 08/14/2015  PHQ - 2 Score 0 0 0 0  PHQ- 9 Score 0 0 - -    Cognitive Function MMSE - Mini Mental State Exam  08/23/2019 07/01/2018  Orientation to time 5 5  Orientation to Place 5 5  Registration 3 3  Attention/ Calculation 5 0  Recall 3 1  Recall-comments - unable to recall 2 of 3 words  Language- name 2 objects - 0  Language- repeat 1 1  Language- follow 3 step command - 3  Language- read & follow direction - 0  Write a sentence - 0  Copy design - 0  Total score - 18  Mini Cog  Mini-Cog screen was completed. Maximum score is 22. A value of 0 denotes this part of the MMSE was not completed or the patient failed this part of the Mini-Cog screening.       Immunization History  Administered Date(s) Administered  . Fluad Quad(high Dose 65+) 04/06/2019  . Influenza, Seasonal, Injecte, Preservative Fre 08/02/2012, 04/26/2013  . Influenza,inj,Quad PF,6+ Mos 04/30/2014, 08/14/2015, 03/24/2016, 05/19/2017, 04/20/2018  . Pneumococcal Conjugate-13 01/07/2017  . Pneumococcal Polysaccharide-23 07/01/2018  . Td 07/27/2012  . Zoster 04/26/2013    Qualifies for Shingles Vaccine? Yes   Screening Tests Health Maintenance  Topic Date Due  . DTAP VACCINES (1) 02/04/1951  . COLONOSCOPY  03/12/2021  . TETANUS/TDAP  07/27/2022  . INFLUENZA VACCINE  Completed  . Hepatitis C Screening  Completed  . PNA vac Low Risk Adult  Completed  . DTaP/Tdap/Td  Discontinued   Cancer Screenings: Lung: Low Dose CT Chest recommended if Age 48-80 years, 30 pack-year currently smoking OR have quit w/in 15 years. Patient does not qualify. Colorectal: completed 03/13/2011  Additional Screenings:  Hepatitis C Screening: 01/04/2017      Plan:   Patient will maintain and continue medications as prescribed.   I have personally reviewed and noted the following in the patient's chart:   . Medical and social history . Use of alcohol, tobacco or illicit drugs  . Current medications and supplements . Functional ability and status . Nutritional status . Physical activity . Advanced directives . List of other  physicians . Hospitalizations, surgeries, and ER visits in previous 12 months . Vitals . Screenings to include cognitive, depression, and falls . Referrals and appointments  In addition, I have reviewed and discussed with patient certain preventive protocols, quality metrics, and best practice recommendations. A written personalized care plan for preventive services as well as general preventive health recommendations were provided to patient.     Andrez Grime, LPN  [id number]

## 2019-08-29 ENCOUNTER — Ambulatory Visit (INDEPENDENT_AMBULATORY_CARE_PROVIDER_SITE_OTHER): Payer: PPO | Admitting: Family Medicine

## 2019-08-29 ENCOUNTER — Encounter: Payer: Self-pay | Admitting: Family Medicine

## 2019-08-29 ENCOUNTER — Other Ambulatory Visit: Payer: Self-pay

## 2019-08-29 VITALS — BP 128/64 | HR 49 | Temp 97.7°F | Ht 65.5 in | Wt 200.4 lb

## 2019-08-29 DIAGNOSIS — R011 Cardiac murmur, unspecified: Secondary | ICD-10-CM | POA: Diagnosis not present

## 2019-08-29 DIAGNOSIS — M25551 Pain in right hip: Secondary | ICD-10-CM

## 2019-08-29 DIAGNOSIS — I10 Essential (primary) hypertension: Secondary | ICD-10-CM

## 2019-08-29 DIAGNOSIS — G90522 Complex regional pain syndrome I of left lower limb: Secondary | ICD-10-CM | POA: Diagnosis not present

## 2019-08-29 DIAGNOSIS — M25522 Pain in left elbow: Secondary | ICD-10-CM

## 2019-08-29 DIAGNOSIS — E782 Mixed hyperlipidemia: Secondary | ICD-10-CM | POA: Diagnosis not present

## 2019-08-29 DIAGNOSIS — Z0001 Encounter for general adult medical examination with abnormal findings: Secondary | ICD-10-CM | POA: Diagnosis not present

## 2019-08-29 DIAGNOSIS — D696 Thrombocytopenia, unspecified: Secondary | ICD-10-CM

## 2019-08-29 DIAGNOSIS — Z7189 Other specified counseling: Secondary | ICD-10-CM | POA: Diagnosis not present

## 2019-08-29 DIAGNOSIS — M25422 Effusion, left elbow: Secondary | ICD-10-CM

## 2019-08-29 DIAGNOSIS — G8929 Other chronic pain: Secondary | ICD-10-CM | POA: Diagnosis not present

## 2019-08-29 DIAGNOSIS — F1120 Opioid dependence, uncomplicated: Secondary | ICD-10-CM | POA: Diagnosis not present

## 2019-08-29 DIAGNOSIS — E669 Obesity, unspecified: Secondary | ICD-10-CM

## 2019-08-29 DIAGNOSIS — E66811 Obesity, class 1: Secondary | ICD-10-CM

## 2019-08-29 DIAGNOSIS — M1A09X0 Idiopathic chronic gout, multiple sites, without tophus (tophi): Secondary | ICD-10-CM

## 2019-08-29 DIAGNOSIS — Z0000 Encounter for general adult medical examination without abnormal findings: Secondary | ICD-10-CM

## 2019-08-29 MED ORDER — PROBENECID 500 MG PO TABS: ORAL_TABLET | ORAL | 3 refills | Status: DC

## 2019-08-29 MED ORDER — LOSARTAN POTASSIUM 100 MG PO TABS: 100.0000 mg | ORAL_TABLET | Freq: Every day | ORAL | 3 refills | Status: DC

## 2019-08-29 MED ORDER — GABAPENTIN 300 MG PO CAPS: ORAL_CAPSULE | ORAL | 3 refills | Status: DC

## 2019-08-29 MED ORDER — PRAVASTATIN SODIUM 40 MG PO TABS: 40.0000 mg | ORAL_TABLET | Freq: Every day | ORAL | 3 refills | Status: DC

## 2019-08-29 MED ORDER — COLCHICINE 0.6 MG PO TABS: 0.6000 mg | ORAL_TABLET | Freq: Every day | ORAL | 3 refills | Status: DC

## 2019-08-29 NOTE — Assessment & Plan Note (Addendum)
Stable period. ?Aortic sclerosis.

## 2019-08-29 NOTE — Assessment & Plan Note (Signed)
Advanced directive discussion - has living will at home. Wife is HCPOA Randy Kennedy. Asked to bring copy 

## 2019-08-29 NOTE — Assessment & Plan Note (Signed)
Describes hip bursitis pain - rec schedule appt with Dr Lorelei Pont for consideration of hip bursal steroid injection.

## 2019-08-29 NOTE — Assessment & Plan Note (Signed)
Chronic, stable. Continue pravastatin. The 10-year ASCVD risk score Mikey Bussing DC Brooke Bonito., et al., 2013) is: 13%   Values used to calculate the score:     Age: 69 years     Sex: Male     Is Non-Hispanic African American: No     Diabetic: No     Tobacco smoker: No     Systolic Blood Pressure: 0000000 mmHg     Is BP treated: Yes     HDL Cholesterol: 87.1 mg/dL     Total Cholesterol: 176 mg/dL

## 2019-08-29 NOTE — Patient Instructions (Addendum)
Increase gabapentin to 2 pills at night time (600mg ).  I think you have Right hip bursitis - schedule appointment with Dr Lorelei Pont to discuss steroid injection into the hip. Look at stretching exercises provided today If interested, check with pharmacy about new 2 shot shingles series (shingrix).  Bring [place] a copy of your living will when completed.  You are doing well today - congratulations on weight loss - keep it up! Blood work looking great today.  Return in 3 months for follow up visit.   Health Maintenance After Age 92 After age 3, you are at a higher risk for certain long-term diseases and infections as well as injuries from falls. Falls are a major cause of broken bones and head injuries in people who are older than age 53. Getting regular preventive care can help to keep you healthy and well. Preventive care includes getting regular testing and making lifestyle changes as recommended by your health care provider. Talk with your health care provider about:  Which screenings and tests you should have. A screening is a test that checks for a disease when you have no symptoms.  A diet and exercise plan that is right for you. What should I know about screenings and tests to prevent falls? Screening and testing are the best ways to find a health problem early. Early diagnosis and treatment give you the best chance of managing medical conditions that are common after age 52. Certain conditions and lifestyle choices may make you more likely to have a fall. Your health care provider may recommend:  Regular vision checks. Poor vision and conditions such as cataracts can make you more likely to have a fall. If you wear glasses, make sure to get your prescription updated if your vision changes.  Medicine review. Work with your health care provider to regularly review all of the medicines you are taking, including over-the-counter medicines. Ask your health care provider about any side effects that  may make you more likely to have a fall. Tell your health care provider if any medicines that you take make you feel dizzy or sleepy.  Osteoporosis screening. Osteoporosis is a condition that causes the bones to get weaker. This can make the bones weak and cause them to break more easily.  Blood pressure screening. Blood pressure changes and medicines to control blood pressure can make you feel dizzy.  Strength and balance checks. Your health care provider may recommend certain tests to check your strength and balance while standing, walking, or changing positions.  Foot health exam. Foot pain and numbness, as well as not wearing proper footwear, can make you more likely to have a fall.  Depression screening. You may be more likely to have a fall if you have a fear of falling, feel emotionally low, or feel unable to do activities that you used to do.  Alcohol use screening. Using too much alcohol can affect your balance and may make you more likely to have a fall. What actions can I take to lower my risk of falls? General instructions  Talk with your health care provider about your risks for falling. Tell your health care provider if: ? You fall. Be sure to tell your health care provider about all falls, even ones that seem minor. ? You feel dizzy, sleepy, or off-balance.  Take over-the-counter and prescription medicines only as told by your health care provider. These include any supplements.  Eat a healthy diet and maintain a healthy weight. A healthy diet includes  low-fat dairy products, low-fat (lean) meats, and fiber from whole grains, beans, and lots of fruits and vegetables. Home safety  Remove any tripping hazards, such as rugs, cords, and clutter.  Install safety equipment such as grab bars in bathrooms and safety rails on stairs.  Keep rooms and walkways well-lit. Activity   Follow a regular exercise program to stay fit. This will help you maintain your balance. Ask your  health care provider what types of exercise are appropriate for you.  If you need a cane or walker, use it as recommended by your health care provider.  Wear supportive shoes that have nonskid soles. Lifestyle  Do not drink alcohol if your health care provider tells you not to drink.  If you drink alcohol, limit how much you have: ? 0-1 drink a day for women. ? 0-2 drinks a day for men.  Be aware of how much alcohol is in your drink. In the [place] one drink equals one typical bottle of beer (12 oz), one-half glass of wine (5 oz), or one shot of hard liquor (1 oz).  Do not use any products that contain nicotine or tobacco, such as cigarettes and e-cigarettes. If you need help quitting, ask your health care provider. Summary  Having a healthy lifestyle and getting preventive care can help to protect your health and wellness after age 33.  Screening and testing are the best way to find a health problem early and help you avoid having a fall. Early diagnosis and treatment give you the best chance for managing medical conditions that are more common for people who are older than age 44.  Falls are a major cause of broken bones and head injuries in people who are older than age 50. Take precautions to prevent a fall at home.  Work with your health care provider to learn what changes you can make to improve your health and wellness and to prevent falls. This information is not intended to replace advice given to you by your health care provider. Make sure you discuss any questions you have with your health care provider. Document Revised: 11/03/2018 Document Reviewed: 05/26/2017 Elsevier Patient Education  2020 [employer].

## 2019-08-29 NOTE — Assessment & Plan Note (Signed)
Ongoing

## 2019-08-29 NOTE — Assessment & Plan Note (Signed)
[place] CSRS reviewed.  Increase night time gabapentin.

## 2019-08-29 NOTE — Assessment & Plan Note (Signed)
Preventative protocols reviewed and updated unless pt declined. Discussed healthy diet and lifestyle.  

## 2019-08-29 NOTE — Assessment & Plan Note (Signed)
Increase gabapentin to 600mg  at night continue 300mg  bid.

## 2019-08-29 NOTE — Assessment & Plan Note (Signed)
Chronic, improved with weight loss. Continue current regimen.

## 2019-08-29 NOTE — Assessment & Plan Note (Signed)
Urate stable.

## 2019-08-29 NOTE — Assessment & Plan Note (Signed)
Stable period.  
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
## 2019-08-29 NOTE — Assessment & Plan Note (Signed)
Encouraged ongoing healthy diet and lifestyle changes for sustainable weight loss. Motivated to continue efforts.

## 2019-08-30 ENCOUNTER — Encounter: Payer: Self-pay | Admitting: Family Medicine

## 2019-08-30 ENCOUNTER — Ambulatory Visit (INDEPENDENT_AMBULATORY_CARE_PROVIDER_SITE_OTHER)
Admission: RE | Admit: 2019-08-30 | Discharge: 2019-08-30 | Disposition: A | Discharge status: Home / Self Care | Payer: PPO | Source: Ambulatory Visit | Attending: Family Medicine | Admitting: Family Medicine

## 2019-08-30 ENCOUNTER — Ambulatory Visit (INDEPENDENT_AMBULATORY_CARE_PROVIDER_SITE_OTHER): Payer: PPO | Admitting: Family Medicine

## 2019-08-30 VITALS — BP 160/72 | HR 50 | Temp 97.6°F | Ht 65.5 in | Wt 205.0 lb

## 2019-08-30 DIAGNOSIS — G90522 Complex regional pain syndrome I of left lower limb: Secondary | ICD-10-CM

## 2019-08-30 DIAGNOSIS — M25551 Pain in right hip: Secondary | ICD-10-CM | POA: Diagnosis not present

## 2019-08-30 DIAGNOSIS — M6798 Unspecified disorder of synovium and tendon, other site: Secondary | ICD-10-CM

## 2019-08-30 DIAGNOSIS — M7061 Trochanteric bursitis, right hip: Secondary | ICD-10-CM

## 2019-08-30 DIAGNOSIS — G8929 Other chronic pain: Secondary | ICD-10-CM

## 2019-08-30 DIAGNOSIS — F1120 Opioid dependence, uncomplicated: Secondary | ICD-10-CM

## 2019-08-30 DIAGNOSIS — M51369 Other intervertebral disc degeneration, lumbar region without mention of lumbar back pain or lower extremity pain: Secondary | ICD-10-CM

## 2019-08-30 DIAGNOSIS — Z8781 Personal history of (healed) traumatic fracture: Secondary | ICD-10-CM | POA: Diagnosis not present

## 2019-08-30 DIAGNOSIS — M5136 Other intervertebral disc degeneration, lumbar region: Secondary | ICD-10-CM | POA: Diagnosis not present

## 2019-08-30 DIAGNOSIS — M549 Dorsalgia, unspecified: Secondary | ICD-10-CM

## 2019-08-30 DIAGNOSIS — M1611 Unilateral primary osteoarthritis, right hip: Secondary | ICD-10-CM | POA: Diagnosis not present

## 2019-08-30 DIAGNOSIS — M545 Low back pain: Secondary | ICD-10-CM | POA: Diagnosis not present

## 2019-08-30 DIAGNOSIS — M67951 Unspecified disorder of synovium and tendon, right thigh: Secondary | ICD-10-CM

## 2019-08-30 MED ORDER — METHYLPREDNISOLONE ACETATE 40 MG/ML IJ SUSP
80.0000 mg | Freq: Once | INTRAMUSCULAR | Status: AC
  Administered 2019-08-30: 80 mg via INTRA_ARTICULAR
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
## 2019-09-01 ENCOUNTER — Other Ambulatory Visit: Payer: Self-pay | Admitting: Family Medicine

## 2019-09-01 NOTE — Telephone Encounter (Signed)
Name of Medication: Hydrocodone-APAP Name of Pharmacy: [facility] Last Fill or Written Date and Quantity: 08/08/2019, #90 Last Office Visit and Type: 08/30/19 acute visit Next Office Visit and Type: 11/29/19 for 3 months chronic follow up Last Controlled Substance Agreement Date: 09/29/16 Last UDS:  03/10/19

## 2019-09-03 MED ORDER — HYDROCODONE-ACETAMINOPHEN 10-325 MG PO TABS: 1.0000 | ORAL_TABLET | Freq: Four times a day (QID) | ORAL | 0 refills | Status: DC | PRN

## 2019-09-03 NOTE — Telephone Encounter (Signed)
ERx 

## 2019-09-04 ENCOUNTER — Ambulatory Visit (INDEPENDENT_AMBULATORY_CARE_PROVIDER_SITE_OTHER): Payer: PPO | Admitting: Family Medicine

## 2019-09-04 ENCOUNTER — Encounter: Payer: Self-pay | Admitting: Family Medicine

## 2019-09-04 ENCOUNTER — Other Ambulatory Visit: Payer: Self-pay

## 2019-09-04 VITALS — BP 144/86 | HR 53 | Temp 98.2°F | Ht 65.5 in | Wt 202.2 lb

## 2019-09-04 DIAGNOSIS — M533 Sacrococcygeal disorders, not elsewhere classified: Secondary | ICD-10-CM | POA: Diagnosis not present

## 2019-09-04 DIAGNOSIS — M5416 Radiculopathy, lumbar region: Secondary | ICD-10-CM

## 2019-09-04 MED ORDER — PREDNISONE 20 MG PO TABS: ORAL_TABLET | ORAL | 0 refills | Status: DC

## 2019-09-04 NOTE — Progress Notes (Signed)
Randy Kennedy T. [name], MD Primary Care and Sports Medicine [facility] at [facility] [place], [address] Phone: [phone]  FAX: [place] - 69 y.o. male  MRN [id number]  Date of Birth: 02-06-1951  Visit Date: 09/04/2019  PCP: [name], MD  Referred by: [name], MD  Chief Complaint  Patient presents with  . Follow-up    Right Hip Pain    This visit occurred during the SARS-CoV-2 public health emergency.  Safety protocols were in place, including screening questions prior to the visit, additional usage of staff PPE, and extensive cleaning of exam room while observing appropriate contact time as indicated for disinfecting solutions.   Subjective:   Randy Kennedy is a 69 y.o. very pleasant male patient with Body mass index is 33.14 kg/m. who presents with the following:  I saw the patient 5 days ago for posterolateral hip pain, did a corticosteroid injection which did not help much.  He does have medical comorbidities and is on chronic narcotics from my partner [name].  Previously I did do a deep trochanteric bursal injection and he received no symptomatic relief from this.  Now he is pointing to his SI joint on the right as the primary source of his pain.  He also has some middle buttock tenderness to a lesser degree.  He also describes now some radicular symptoms going down to the calf.  No ESI in the past.   SI joints and posterior buttocks on the R. No numbness or tingling  Radiculopathy all the way to the calf.   Gabapentin 300, 300, 600. Meloxicam with gout flare Norco 10, #90 / mo pred prn for gout flares - 1 or 2 days  R SI Joint  Review of Systems is noted in the HPI, as appropriate   Objective:   BP (!) 144/86   Pulse (!) 53   Temp 98.2 F (36.8 C) (Temporal)   Ht 5' 5.5" (1.664 m)   Wt 202 lb 4 oz (91.7 kg)   SpO2 97%   BMI 33.14 kg/m    GEN: No acute distress;  alert,appropriate. PULM: Breathing comfortably in no respiratory distress PSYCH: Normally interactive.   Range of motion at  the waist: Flexion, extension, lateral bending and rotation:  full  No echymosis or edema Rises to examination table with mild difficulty Gait: minimally antalgic  Inspection/Deformity: N Paraspinus Tenderness: mildly ttp l3-s1 b  B Ankle Dorsiflexion (L5,4): 5/5 B Great Toe Dorsiflexion (L5,4): 5/5 Heel Walk (L5): WNL Toe Walk (S1): WNL Rise/Squat (L4): WNL, mild pain  SENSORY B Medial Foot (L4): WNL B Dorsum (L5): WNL B Lateral (S1): WNL Light Touch: WNL Pinprick: WNL He does have some chronic L LE decreased sensation post trauma and surgery  B SLR, seated: neg B SLR, supine: neg B FABER: neg B Reverse FABER: neg B Greater Troch: NT B Log Roll: neg B Stork: NT B Sciatic Notch: Notably tender on the R  Deep buttocks ttp on the r near piriformis  Radiology: DG Lumbar Spine Complete  Result Date: 08/30/2019 CLINICAL DATA:  Chronic back pain. EXAM: LUMBAR SPINE - COMPLETE 4+ VIEW COMPARISON:  None. FINDINGS: There is no evidence of lumbar spine fracture. Alignment is normal. Mild-to-moderate severity multilevel endplate sclerosis is seen. Mild multilevel intervertebral disc space narrowing is noted. 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 Electronically Signed   By: Virgina Norfolk M.D.   On: 08/30/2019 15:41  DG Hip Unilat W OR W/O Pelvis 2-3 Views Right  Result Date: 08/30/2019 CLINICAL DATA:  Chronic hip pain. EXAM: DG HIP (WITH OR WITHOUT PELVIS) 2-3V RIGHT COMPARISON:  None. FINDINGS: There is no evidence of hip fracture or dislocation. Very mild degenerative changes seen along the lateral aspect of the right acetabulum. The right hip joint is normal in appearance. IMPRESSION: Very mild degenerative changes. Electronically Signed   By: Virgina Norfolk M.D.   On: 08/30/2019 15:42    Assessment and Plan:      ICD-10-CM   1. 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 Lumbar radiculopathy, acute  M54.16    Level of Medical Decision-Making in this case is MODERATE.   This is not an intra-articular hip problem.  This is not bursitis.  This entirely stems from the patient's SI joint on the right as well as some lumbar radiculopathy.  I gave him a rehab protocol for SI joint dysfunction.  If symptoms persist then some formal physical therapy for manipulation and assistance with HEP would be helpful.  He may get relief just from a course of some steroids.  His gout has resolved with some steroids and only 1 or 2 days of treatment.  Follow-up: he will call in 2-3 weeks.  If not improved, then PT.  Meds ordered this encounter  Medications  . predniSONE (DELTASONE) 20 MG tablet    Sig: 2 tabs po for 7 days, then 1 tab po for 7 days    Dispense:  21 tablet    Refill:  0   There are no discontinued medications. No orders of the defined types were placed in this encounter.   Signed,  Maud Deed. Akil Hoos, MD   Outpatient Encounter Medications as of 09/04/2019  Medication Sig  . colchicine 0.6 MG tablet Take 1 tablet (0.6 mg total) by mouth daily.  Marland Kitchen gabapentin (NEURONTIN) 300 MG capsule Take 1 capsule (300 mg total) by mouth 2 (two) times daily AND 2 capsules (600 mg total) at bedtime.  . hydrALAZINE (APRESOLINE) 100 MG tablet TAKE ONE AND ONE-HALF TABLET BY MOUTH AS DIRECTED THREE TIMES DAILY. MAY INCREASE TO FOUR TIMES DAILY IF NEEDED FOR HIGH BLOOD PRESSURE  . HYDROcodone-acetaminophen (NORCO) 10-325 MG tablet Take 1 tablet by mouth every 6 (six) hours as needed.  . loratadine (CLARITIN) 10 MG tablet Take 1 tablet (10 mg total) by mouth daily.  Marland Kitchen losartan (COZAAR) 100 MG tablet Take 1 tablet (100 mg total) by mouth daily.  . meloxicam (MOBIC) 15 MG tablet TAKE 1 TABLET BY MOUTH ONCE DAILY AS NEEDED FOR  GOUT  FLARE  . nitroGLYCERIN (NITROSTAT) 0.4 MG SL tablet Place 1 tablet (0.4  mg total) under the tongue every 5 (five) minutes as needed for chest pain.  . pravastatin (PRAVACHOL) 40 MG tablet Take 1 tablet (40 mg total) by mouth daily.  . predniSONE (DELTASONE) 20 MG tablet Take 1 tablet (20 mg total) by mouth daily. For 1-2 days prn gout flare  . probenecid (BENEMID) 500 MG tablet Take 1/2 (one-half) tablet by mouth twice daily  . vitamin C (ASCORBIC ACID) 500 MG tablet Take 500 mg by mouth daily.  . predniSONE (DELTASONE) 20 MG tablet 2 tabs po for 7 days, then 1 tab po for 7 days   No facility-administered encounter medications on file as of 09/04/2019.

## 2019-09-05 ENCOUNTER — Telehealth: Payer: Self-pay | Admitting: Family Medicine

## 2019-09-05 NOTE — Progress Notes (Signed)
  Chronic Care Management   Outreach Note  09/05/2019 Name: Randy Kennedy MRN: [id number] DOB: 01-Feb-1951  Referred by: [name], MD Reason for referral : No chief complaint on file.   An unsuccessful telephone outreach was attempted today. The patient was referred to the pharmacist for assistance with care management and care coordination.   Follow Up Plan:   Raynicia Dukes [facility] Scheduler

## 2019-09-05 NOTE — Chronic Care Management (AMB) (Signed)
Chronic Care Management   Note  09/05/2019 Name: CAROLE DEERE MRN: [id number] DOB: 1951-03-07  KENDERICK KOBLER is a 69 y.o. year old male who is a primary care patient of [name], MD. I reached out to [name] by phone today in response to a referral sent by Mr. Trust Leh Egner's PCP, [name], MD.   Mr. Gramling was given information about Chronic Care Management services today including:  1. CCM service includes personalized support from designated clinical staff supervised by his physician, including individualized plan of care and coordination with other care providers 2. 24/7 contact phone numbers for assistance for urgent and routine care needs. 3. Service will only be billed when office clinical staff spend 20 minutes or more in a month to coordinate care. 4. Only one practitioner may furnish and bill the service in a calendar month. 5. The patient may stop CCM services at any time (effective at the end of the month) by phone call to the office staff. 6. The patient will be responsible for cost sharing (co-pay) of up to 20% of the service fee (after annual deductible is met).  Patient agreed to services and verbal consent obtained.   Follow up plan:   Raynicia Dukes [facility] Scheduler

## 2019-09-06 NOTE — Chronic Care Management (AMB) (Signed)
Chronic Care Management Pharmacy  Name: Randy Kennedy  MRN: [id number] DOB: Jul 06, 1951  Chief Complaint/ HPI  Randy Kennedy,  69 y.o., male presents for their Initial CCM visit with the clinical pharmacist via telephone.  PCP : Randy Bush, MD  Specialists:   Randy Kennedy, Cardiology   Their chronic conditions include: HTN, allergic rhinitis, HLD, gout, chronic pain  Patient concerns: no medication concerns  Office Visits:  09/03/18: [facility] - follow up right hip pain, given prednisone x 7 days 08/30/19: [facility] - steroid injection 08/27/18: Danise Mina, [facility] - increase gabapentin bedtime dose, referral to [facility], recommend shingles vaccines  Consult Visit: 12/06/18: Rockey Situ, Cardiology - start doxazosin for BP, furosemide 20 mg BID, and potassium  Allergies:  Lisinopril/HCTZ (burning in feet)  Amlodipine (BLE)  Doxazosin (rash)  Nortriptyline (rash)  Allopurinol (GI upset)  Procardia (bradycardia)  Spironolactone (malaise, dizziness)  Uloric (rash)  Medications: Outpatient Encounter Medications as of 09/07/2019  Medication Sig  . colchicine 0.6 MG tablet Take 1 tablet (0.6 mg total) by mouth daily.  Marland Kitchen gabapentin (NEURONTIN) 300 MG capsule Take 1 capsule (300 mg total) by mouth 2 (two) times daily AND 2 capsules (600 mg total) at bedtime.  . hydrALAZINE (APRESOLINE) 100 MG tablet TAKE ONE AND ONE-HALF TABLET BY MOUTH AS DIRECTED THREE TIMES DAILY. MAY INCREASE TO FOUR TIMES DAILY IF NEEDED FOR HIGH BLOOD PRESSURE  . HYDROcodone-acetaminophen (NORCO) 10-325 MG tablet Take 1 tablet by mouth every 6 (six) hours as needed.  . loratadine (CLARITIN) 10 MG tablet Take 1 tablet (10 mg total) by mouth daily.  Marland Kitchen losartan (COZAAR) 100 MG tablet Take 1 tablet (100 mg total) by mouth daily.  . meloxicam (MOBIC) 15 MG tablet TAKE 1 TABLET BY MOUTH ONCE DAILY AS NEEDED FOR  GOUT  FLARE  . nitroGLYCERIN (NITROSTAT) 0.4 MG SL tablet Place 1 tablet (0.4 mg total) under  the tongue every 5 (five) minutes as needed for chest pain.  . pravastatin (PRAVACHOL) 40 MG tablet Take 1 tablet (40 mg total) by mouth daily.  . predniSONE (DELTASONE) 20 MG tablet Take 1 tablet (20 mg total) by mouth daily. For 1-2 days prn gout flare  . predniSONE (DELTASONE) 20 MG tablet 2 tabs po for 7 days, then 1 tab po for 7 days  . probenecid (BENEMID) 500 MG tablet Take 1/2 (one-half) tablet by mouth twice daily  . vitamin C (ASCORBIC ACID) 500 MG tablet Take 500 mg by mouth daily.   No facility-administered encounter medications on file as of 09/07/2019.    Current Diagnosis/Assessment: Goals    . Pharmacy Care Plan     Current Barriers:  . Chronic Disease Management support, education, and care coordination needs related to hypertension, allergic rhinitis, hyperlipidemia, gout, chronic pain  Pharmacist Clinical Goal(s):  Marland Kitchen Pharmacy review of medication safety every 6 months . Continue taking medications as prescribed . Continue to achieve pain control  . Limit cumulative anti-inflammatory use (Advil, naproxen, meloxicam) due to interaction with aspirin, increased risk of bleeding, and reduced kidney function   Interventions: . Comprehensive medication review performed. [facility] pharmacy delivery services for improved adherence  Patient Self Care Activities:  . Fills pillbox weekly . Checks blood pressure every other day . Takes medications as prescribed . Focusing on healthy diet and lifestyle  Initial goal documentation      Hypertension   Office blood pressures are  BP Readings from Last 3 Encounters:  09/04/19 (!) 144/86  08/30/19 (!) 160/72  08/29/19  128/64   CMP Latest Ref Rng & Units 08/22/2019 03/10/2019 07/01/2018  Glucose 70 - 99 mg/dL 97 128(H) 113(H)  BUN 6 - 23 mg/dL 18 21 27(H)  Creatinine 0.40 - 1.50 mg/dL 1.08 1.16 1.34  Sodium 135 - 145 mEq/L 141 139 140  Potassium 3.5 - 5.1 mEq/L 4.0 3.9 4.3  Chloride 96 - 112 mEq/L 102 101 102  CO2 19  - 32 mEq/L 30 30 29   Calcium 8.4 - 10.5 mg/dL 9.3 9.1 9.8  Total Protein 6.0 - 8.3 g/dL 7.2 - 7.5  Total Bilirubin 0.2 - 1.2 mg/dL 0.8 - 1.2  Alkaline Phos 39 - 117 U/L 76 - 74  AST 0 - 37 U/L 18 - 25  ALT 0 - 53 U/L 17 - 28   BP goal < 140/90 mmHg Patient has failed these meds in the past: Lisinopril/HCTZ (burning in feet); Amlodipine (BLE), doxazosin (rash), spironolactone (malaise, dizziness), procardia (bradycardia), avoid thiazides due to gout Patient checks BP at home: every other day Patient home BP readings are ranging: 138/78 mmHg, HR: ranges 40 to 58 (history of bradycardia)  Patient is currently controlled on the following medications:   Hydralazine 100 mg - one and one-half TID ([place])  Losartan 100 mg - one tablet daily   We discussed: patient has a pill cutter for taking half-tablets, confirms adherence, patient is watching diet and has lost some weight since Sept 2020, resulting in improved BP  Plan: Continue current medications   Hyperlipidemia    Lipid Panel     Component Value Date/Time   CHOL 176 08/22/2019 0801   CHOL 130 02/09/2012 0000   TRIG 92.0 08/22/2019 0801   TRIG 142 02/09/2012 0000   HDL 87.10 08/22/2019 0801   CHOLHDL 2 08/22/2019 0801   VLDL 18.4 08/22/2019 0801   LDLCALC 71 08/22/2019 0801   LDLCALC 47 02/09/2012 0000   LDLDIRECT 90.0 07/01/2018 0935   No personal history of ASCVD; LDL goal < 100 Patient has failed these meds in past: none  Patient is currently controlled on the following medications:   Pravastatin 40 mg - 1 tablet daily  Plan: Continue current medications  Gout  Uric acid (02/09/12): 5.9 Location: almost every joint, reports 'cyst' on one location Frequency: Once every 2 weeks to monthly Patient has failed these meds in past: allopurinol (GI upset), Uloric (rash) Patient is currently controlled on the following medications:  Daily:  Probenacid 500 mg - one-half tablet twice daily  Colchicine 0.6 mg - 1 tablet  daily, increase to 2 tablets on first day of flare  Flares:  Prednisone 20 mg - 1 tablet daily at first sign of gout flare  Meloxicam 15 mg - 1 tablet daily at first sign of gout flare We discussed: flares usually only last 1-2 days when promptly treating with prednisone and meloxicam Plan: Continue current medications. Limit OTC Advil use in addition to meloxicam and aspirin.  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, has had multiple surgeries; hip pain, lower back both sides, entire left side, bursitis left shoulder Symptom relief: medication helps but really just takes edge off Side effects: drowsiness with higher daytime dosing of gabapentin Patient is currently controlled on the following medications:   Gabapentin 300 mg - 1 capsule BID and 2 capsules at bedtime   Hydrocodone/APAP 10-325mg  - 1 tablet every 6 hours as needed (takes twice a day)   We discussed:  PCP  increased gabapentin from TID to current 2/1 and patient is happy with current dose so far, discussed optimal dosing of gabapentin if patient is interested in dose increase in the future; orders Norco refills through [facility] and sees PCP every 3 months  Plan: Continue current medications  Allergic Rhinitis  History: allergic to almost everything Patient has failed these meds in past: none reported Patient is currently controlled on the following medications:   Cetirizine (Zytrec)- one tablet daily (uses prn, makes him sleepy)  Plan: Continue current medications  Medication Management  Misc: Nitroglycerin (hasnt used in 5 years, but keeps medication in date)  OTCs: vitamin C 500 mg, aspirin 81 mg, airborne, Advil - sinus headache, often (discussed ADRs of NSAIDs), recommended Tylenol  Pharmacy: [facility], [facility] --> switching to [facility] ([facility] preferred)  Adherence: pillbox weekly  Social support: family, church  Affordability: no cost  concerns, started using [facility] in addition to [facility] because [facility]'s Norco caused some allergy symptoms (different manufacturer - more powdery)  CCM Follow Up: Friday, 12/08/2019 10:00 AM (telephone)  Verbal consent obtained for [facility] enhanced pharmacy services (medication synchronization, adherence packaging, delivery coordination). A medication sync plan was created to allow patient to get all medications delivered once every 30 to 90 days per patient preference. Patient understands they have freedom to choose pharmacy and clinical pharmacist will coordinate care between all prescribers and [facility].  [name], PharmD Clinical Pharmacist [facility] at [facility] [phone]

## 2019-09-07 ENCOUNTER — Other Ambulatory Visit: Payer: Self-pay

## 2019-09-07 ENCOUNTER — Ambulatory Visit: Payer: PPO

## 2019-09-07 DIAGNOSIS — M25551 Pain in right hip: Secondary | ICD-10-CM

## 2019-09-07 DIAGNOSIS — J302 Other seasonal allergic rhinitis: Secondary | ICD-10-CM

## 2019-09-07 DIAGNOSIS — G4733 Obstructive sleep apnea (adult) (pediatric): Secondary | ICD-10-CM

## 2019-09-07 DIAGNOSIS — M1A09X0 Idiopathic chronic gout, multiple sites, without tophus (tophi): Secondary | ICD-10-CM

## 2019-09-07 DIAGNOSIS — I10 Essential (primary) hypertension: Secondary | ICD-10-CM

## 2019-09-07 DIAGNOSIS — M79604 Pain in right leg: Secondary | ICD-10-CM

## 2019-09-07 DIAGNOSIS — E782 Mixed hyperlipidemia: Secondary | ICD-10-CM

## 2019-09-07 NOTE — Patient Instructions (Addendum)
Dear Randy Kennedy,  It was a pleasure meeting you during our initial appointment on September 07, 2019. Below is a summary of the goals we discussed and components of chronic care management. Please contact me anytime with questions or concerns. I have included a handout on the DASH diet to assist with your weight loss efforts. Congrats on your achievement!  Visit Information  Goals Addressed            This Visit's Progress   . COMPLETED: Patient Stated       Starting 07/01/2018, I will continue to take medications as prescribed.     . Pharmacy Care Plan       Current Barriers:  . Chronic Disease Management support, education, and care coordination needs related to hypertension, allergic rhinitis, hyperlipidemia, gout, chronic pain  Pharmacist Clinical Goal(s):  Marland Kitchen Pharmacy review of medication safety every 6 months . Continue taking medications as prescribed . Continue to achieve pain control  . Limit cumulative anti-inflammatory use (Advil, naproxen, meloxicam) due to interaction with aspirin, increased risk of bleeding, and reduced kidney function   Interventions: . Comprehensive medication review performed. [facility] pharmacy delivery services for improved adherence  Patient Self Care Activities:  . Fills pillbox weekly . Checks blood pressure every other day . Takes medications as prescribed . Focusing on healthy diet and lifestyle  Initial goal documentation       Randy Kennedy was given information about Chronic Care Management services today including:  1. CCM service includes personalized support from designated clinical staff supervised by his physician, including individualized plan of care and coordination with other care providers 2. 24/7 contact phone numbers for assistance for urgent and routine care needs. 3. Service will only be billed when office clinical staff spend 20 minutes or more in a month to coordinate care. 4. Only one practitioner may  furnish and bill the service in a calendar month. 5. The patient may stop CCM services at any time (effective at the end of the month) by phone call to the office staff. 6. The patient will be responsible for cost sharing (co-pay) of up to 20% of the service fee (after annual deductible is met).  Patient agreed to services and verbal consent obtained.   Telephone follow up appointment with pharmacy team member scheduled for: Friday, 12/08/2019 at 10:00 AM    [name], PharmD Clinical Pharmacist [facility] [phone]   [place] stands for "Dietary Approaches to Stop Hypertension." The DASH eating plan is a healthy eating plan that has been shown to reduce high blood pressure (hypertension). It may also reduce your risk for type 2 diabetes, heart disease, and stroke. The DASH eating plan may also help with weight loss. What are tips for following this plan?  General guidelines  Avoid eating more than 2,300 mg (milligrams) of salt (sodium) a day. If you have hypertension, you may need to reduce your sodium intake to 1,500 mg a day.  Limit alcohol intake to no more than 1 drink a day for nonpregnant women and 2 drinks a day for men. One drink equals 12 oz of beer, 5 oz of wine, or 1 oz of hard liquor.  Work with your health care provider to maintain a healthy body weight or to lose weight. Ask what an ideal weight is for you.  Get at least 30 minutes of exercise that causes your heart to beat faster (aerobic exercise) most days of the week.  Activities may include walking, swimming, or biking.  Work with your health care provider or diet and nutrition specialist (dietitian) to adjust your eating plan to your individual calorie needs. Reading food labels   Check food labels for the amount of sodium per serving. Choose foods with less than 5 percent of the Daily Value of sodium. Generally, foods with less than 300 mg of sodium per serving fit  into this eating plan.  To find whole grains, look for the word "whole" as the first word in the ingredient list. Shopping  Buy products labeled as "low-sodium" or "no salt added."  Buy fresh foods. Avoid canned foods and premade or frozen meals. Cooking  Avoid adding salt when cooking. Use salt-free seasonings or herbs instead of table salt or sea salt. Check with your health care provider or pharmacist before using salt substitutes.  Do not fry foods. Cook foods using healthy methods such as baking, boiling, grilling, and broiling instead.  Cook with heart-healthy oils, such as olive, canola, soybean, or sunflower oil. Meal planning  Eat a balanced diet that includes: ? 5 or more servings of fruits and vegetables each day. At each meal, try to fill half of your plate with fruits and vegetables. ? Up to 6-8 servings of whole grains each day. ? Less than 6 oz of lean meat, poultry, or fish each day. A 3-oz serving of meat is about the same size as a deck of cards. One egg equals 1 oz. ? 2 servings of low-fat dairy each day. ? A serving of nuts, seeds, or beans 5 times each week. ? Heart-healthy fats. Healthy fats called Omega-3 fatty acids are found in foods such as flaxseeds and coldwater fish, like sardines, salmon, and mackerel.  Limit how much you eat of the following: ? Canned or prepackaged foods. ? Food that is high in trans fat, such as fried foods. ? Food that is high in saturated fat, such as fatty meat. ? Sweets, desserts, sugary drinks, and other foods with added sugar. ? Full-fat dairy products.  Do not salt foods before eating.  Try to eat at least 2 vegetarian meals each week.  Eat more home-cooked food and less restaurant, buffet, and fast food.  When eating at a restaurant, ask that your food be prepared with less salt or no salt, if possible. What foods are recommended? The items listed may not be a complete list. Talk with your dietitian about what dietary  choices are best for you. Grains Whole-grain or whole-wheat bread. Whole-grain or whole-wheat pasta. Brown rice. [place]. Bulgur. Whole-grain and low-sodium cereals. Pita bread. Low-fat, low-sodium crackers. Whole-wheat flour tortillas. Vegetables Fresh or frozen vegetables (raw, steamed, roasted, or grilled). Low-sodium or reduced-sodium tomato and vegetable juice. Low-sodium or reduced-sodium tomato sauce and tomato paste. Low-sodium or reduced-sodium canned vegetables. Fruits All fresh, dried, or frozen fruit. Canned fruit in natural juice (without added sugar). Meat and other protein foods Skinless chicken or [place]. Ground chicken or [place]. Pork with fat trimmed off. Fish and seafood. Egg whites. Dried beans, peas, or lentils. Unsalted nuts, nut butters, and seeds. Unsalted canned beans. Lean cuts of beef with fat trimmed off. Low-sodium, lean deli meat. Dairy Low-fat (1%) or fat-free (skim) milk. Fat-free, low-fat, or reduced-fat cheeses. Nonfat, low-sodium ricotta or cottage cheese. Low-fat or nonfat yogurt. Low-fat, low-sodium cheese. Fats and oils Soft margarine without trans fats. Vegetable oil. Low-fat, reduced-fat, or light mayonnaise and salad dressings (reduced-sodium). Canola, safflower, olive, soybean, and sunflower oils. Avocado.  Seasoning and other foods Herbs. Spices. Seasoning mixes without salt. Unsalted popcorn and pretzels. Fat-free sweets. What foods are not recommended? The items listed may not be a complete list. Talk with your dietitian about what dietary choices are best for you. Grains Baked goods made with fat, such as croissants, muffins, or some breads. Dry pasta or rice meal packs. Vegetables Creamed or fried vegetables. Vegetables in a cheese sauce. Regular canned vegetables (not low-sodium or reduced-sodium). Regular canned tomato sauce and paste (not low-sodium or reduced-sodium). Regular tomato and vegetable juice (not low-sodium or reduced-sodium).  Angie Fava. Olives. Fruits Canned fruit in a light or heavy syrup. Fried fruit. Fruit in cream or butter sauce. Meat and other protein foods Fatty cuts of meat. Ribs. Fried meat. Berniece Salines. Sausage. Bologna and other processed lunch meats. Salami. Fatback. Hotdogs. Bratwurst. Salted nuts and seeds. Canned beans with added salt. Canned or smoked fish. Whole eggs or egg yolks. Chicken or [place] with skin. Dairy Whole or 2% milk, cream, and half-and-half. Whole or full-fat cream cheese. Whole-fat or sweetened yogurt. Full-fat cheese. Nondairy creamers. Whipped toppings. Processed cheese and cheese spreads. Fats and oils Butter. Stick margarine. Lard. Shortening. Ghee. Bacon fat. Tropical oils, such as coconut, palm kernel, or palm oil. Seasoning and other foods Salted popcorn and pretzels. Onion salt, garlic salt, seasoned salt, table salt, and sea salt. Worcestershire sauce. Tartar sauce. Barbecue sauce. Teriyaki sauce. Soy sauce, including reduced-sodium. Steak sauce. Canned and packaged gravies. Fish sauce. Oyster sauce. Cocktail sauce. Horseradish that you find on the shelf. Ketchup. Mustard. Meat flavorings and tenderizers. Bouillon cubes. Hot sauce and Tabasco sauce. Premade or packaged marinades. Premade or packaged taco seasonings. Relishes. Regular salad dressings. Where to find more information:  National Heart, Lung, and [place]: https://wilson-eaton.com/  American Heart Association: www.heart.org Summary  The DASH eating plan is a healthy eating plan that has been shown to reduce high blood pressure (hypertension). It may also reduce your risk for type 2 diabetes, heart disease, and stroke.  With the DASH eating plan, you should limit salt (sodium) intake to 2,300 mg a day. If you have hypertension, you may need to reduce your sodium intake to 1,500 mg a day.  When on the DASH eating plan, aim to eat more fresh fruits and vegetables, whole grains, lean proteins, low-fat dairy, and  heart-healthy fats.  Work with your health care provider or diet and nutrition specialist (dietitian) to adjust your eating plan to your individual calorie needs. This information is not intended to replace advice given to you by your health care provider. Make sure you discuss any questions you have with your health care provider. Document Revised: 06/25/2017 Document Reviewed: 07/06/2016 Elsevier Patient Education  2020 [employer].

## 2019-09-11 ENCOUNTER — Telehealth: Payer: Self-pay

## 2019-09-11 DIAGNOSIS — K219 Gastro-esophageal reflux disease without esophagitis: Secondary | ICD-10-CM

## 2019-09-11 DIAGNOSIS — E782 Mixed hyperlipidemia: Secondary | ICD-10-CM

## 2019-09-11 DIAGNOSIS — I10 Essential (primary) hypertension: Secondary | ICD-10-CM

## 2019-09-11 NOTE — Telephone Encounter (Signed)
I would like to request a referral for Randy Kennedy to chronic care management pharmacy services focusing on the following conditions:   Essential hypertension, benign  [I10]  GERD [K21.9]  Hyperlipidemia [E78.5]  [name], PharmD Clinical Pharmacist [facility] at [facility] [phone]

## 2019-09-26 ENCOUNTER — Other Ambulatory Visit: Payer: Self-pay | Admitting: Family Medicine

## 2019-09-26 NOTE — Telephone Encounter (Signed)
Name of Medication: Hydrocodone-APAP Name of Pharmacy: [facility] Last Fill or Written Date and Quantity: 09/03/19, #90 Last Office Visit and Type: 09/04/19, hip pain f/u; 08/29/19, AWV prt 2 Next Office Visit and Type: 11/29/19, 3 mo chronic pain f/u Last Controlled Substance Agreement Date: 09/29/16 Last UDS: 03/10/19

## 2019-09-29 MED ORDER — HYDROCODONE-ACETAMINOPHEN 10-325 MG PO TABS: 1.0000 | ORAL_TABLET | Freq: Four times a day (QID) | ORAL | 0 refills | Status: DC | PRN

## 2019-09-29 NOTE — Telephone Encounter (Signed)
ERx 

## 2019-10-04 ENCOUNTER — Other Ambulatory Visit: Payer: Self-pay

## 2019-10-04 MED ORDER — HYDRALAZINE HCL 100 MG PO TABS: ORAL_TABLET | ORAL | 3 refills | Status: DC

## 2019-10-13 ENCOUNTER — Telehealth: Payer: Self-pay

## 2019-10-13 NOTE — Telephone Encounter (Signed)
Randy Kennedy is transferring his preferred pharmacy to [facility]. The following medication refills are needed:   Losartan, pravastatin, probenecid, colchicine, gabapentin  Please send refills to [facility] in [place].  Thank you!  [name], PharmD Clinical Pharmacist [facility] at [facility] [phone]

## 2019-10-13 NOTE — Telephone Encounter (Signed)
Randy Kennedy is transferring his preferred pharmacy to [facility]. The following medication refills are needed:   Hydralazine   Please send refills to [facility] in [place].  Thank you!  [name], PharmD Clinical Pharmacist [facility] at [facility] [phone]

## 2019-10-14 NOTE — Telephone Encounter (Signed)
Pam, would you mind refilling  Thx TG

## 2019-10-16 MED ORDER — PROBENECID 500 MG PO TABS: ORAL_TABLET | ORAL | 3 refills | Status: DC

## 2019-10-16 MED ORDER — COLCHICINE 0.6 MG PO TABS: 0.6000 mg | ORAL_TABLET | Freq: Every day | ORAL | 3 refills | Status: DC

## 2019-10-16 MED ORDER — GABAPENTIN 300 MG PO CAPS: ORAL_CAPSULE | ORAL | 3 refills | Status: DC

## 2019-10-16 MED ORDER — LOSARTAN POTASSIUM 100 MG PO TABS: 100.0000 mg | ORAL_TABLET | Freq: Every day | ORAL | 3 refills | Status: DC

## 2019-10-16 MED ORDER — PRAVASTATIN SODIUM 40 MG PO TABS: 40.0000 mg | ORAL_TABLET | Freq: Every day | ORAL | 3 refills | Status: DC

## 2019-10-16 MED ORDER — HYDRALAZINE HCL 100 MG PO TABS: ORAL_TABLET | ORAL | 3 refills | Status: DC

## 2019-10-16 NOTE — Telephone Encounter (Signed)
E-scribed requested refills (per original refills allowed by [name] last month) to [facility].

## 2019-10-16 NOTE — Telephone Encounter (Signed)
Medication refill sent to pharmacy requested.

## 2019-10-18 ENCOUNTER — Telehealth: Payer: Self-pay | Admitting: Family Medicine

## 2019-10-18 ENCOUNTER — Other Ambulatory Visit: Payer: Self-pay | Admitting: Family Medicine

## 2019-10-18 ENCOUNTER — Ambulatory Visit: Payer: Self-pay

## 2019-10-18 DIAGNOSIS — E782 Mixed hyperlipidemia: Secondary | ICD-10-CM

## 2019-10-18 DIAGNOSIS — I10 Essential (primary) hypertension: Secondary | ICD-10-CM

## 2019-10-18 DIAGNOSIS — M79604 Pain in right leg: Secondary | ICD-10-CM

## 2019-10-18 DIAGNOSIS — M25551 Pain in right hip: Secondary | ICD-10-CM

## 2019-10-18 DIAGNOSIS — K219 Gastro-esophageal reflux disease without esophagitis: Secondary | ICD-10-CM

## 2019-10-18 NOTE — Telephone Encounter (Signed)
I spoke with pt and informed that his medication has been sent to [facility].  Gave him the address and phone number.

## 2019-10-18 NOTE — Telephone Encounter (Signed)
Pt called wanting to know why his rx was declined.  He stated he has been taking this for ever Best number [phone]

## 2019-10-18 NOTE — Chronic Care Management (AMB) (Signed)
Medication synchronization and delivery coordination follow up:   Explained patient should contact CCM pharmacist for any medication needs. Medications will be delivered through [facility]. Pharmacist will coordinate care with [facility]. All maintenance medication refills have been received and med sync plan developed.  Refill request: Patient is requesting a refill of hydrocodone/acetaminophen 10-325 mg. He takes it as needed, usually three times a day. Last filled 09/29/19 for 90 tablets. He would like this delivered 10/27/19. Will route to PCP for refill.    [name], PharmD Clinical Pharmacist [facility] at [facility] [phone]

## 2019-10-18 NOTE — Telephone Encounter (Signed)
Last fill 10/16/19 #90/3 To [facility]

## 2019-10-18 NOTE — Telephone Encounter (Signed)
Last fill 10/16/19  [facility] Last OV 08/29/19

## 2019-10-25 ENCOUNTER — Other Ambulatory Visit: Payer: Self-pay

## 2019-10-25 NOTE — Telephone Encounter (Signed)
Name of Medication: Hydrocodone-APAP Name of Pharmacy: [facility] or Written Date and Quantity: 09/29/19, #90 Last Office Visit and Type: 10/18/19, R hip pain f/u; 08/29/19, AWV prt 2 Next Office Visit and Type: 11/29/19, 3 mo chronic pain mngmt Last Controlled Substance Agreement Date: 09/29/16 Last UDS: 03/10/19

## 2019-10-25 NOTE — Telephone Encounter (Signed)
Patient would like refill on hydrocodone/acetaminophen. He is still using [facility].  Thank you,  [name], PharmD Clinical Pharmacist [facility] at [facility] [phone]

## 2019-10-26 MED ORDER — HYDROCODONE-ACETAMINOPHEN 10-325 MG PO TABS: 1.0000 | ORAL_TABLET | Freq: Four times a day (QID) | ORAL | 0 refills | Status: DC | PRN

## 2019-10-26 NOTE — Telephone Encounter (Signed)
ERx 

## 2019-11-29 ENCOUNTER — Other Ambulatory Visit: Payer: Self-pay | Admitting: Family Medicine

## 2019-11-29 ENCOUNTER — Ambulatory Visit: Payer: PPO | Admitting: Family Medicine

## 2019-11-29 MED ORDER — HYDROCODONE-ACETAMINOPHEN 10-325 MG PO TABS: 1.0000 | ORAL_TABLET | Freq: Four times a day (QID) | ORAL | 0 refills | Status: DC | PRN

## 2019-11-29 NOTE — Telephone Encounter (Signed)
ERx 

## 2019-11-29 NOTE — Telephone Encounter (Signed)
Name of Medication: Hydrocodone Last Fill or Written Date and Quantity: 10/26/19 #90 Last Office Visit and Type: 09/04/19 Dr. Lorelei Pont Next Office Visit and Type: Cancelled appointment today 11/29/19 Last Controlled Substance Agreement Date: 09/29/16 Last UDS: 03/10/19

## 2019-12-06 ENCOUNTER — Telehealth: Payer: Self-pay | Admitting: Family Medicine

## 2019-12-06 NOTE — Chronic Care Management (AMB) (Signed)
  Chronic Care Management   Outreach Note  12/06/2019 Name: Randy Kennedy MRN: [id number] DOB: 06-07-1951  Referred by: [name], MD Reason for referral : Chronic Care Management   An unsuccessful telephone outreach was attempted today. The patient was referred to the pharmacist for assistance with care management and care coordination.   Follow Up Plan:

## 2019-12-08 ENCOUNTER — Telehealth: Payer: PPO

## 2019-12-13 ENCOUNTER — Telehealth: Payer: Self-pay | Admitting: Family Medicine

## 2019-12-13 NOTE — Progress Notes (Signed)
°  Chronic Care Management   Outreach Note  12/13/2019 Name: KENYON CRAMER MRN: [id number] DOB: 1950-08-11  Referred by: [name], MD Reason for referral : No chief complaint on file.   An unsuccessful telephone outreach was attempted today. The patient was referred to the pharmacist for assistance with care management and care coordination.   This note is not being shared with the patient for the following reason: To respect privacy (The patient or proxy has requested that the information not be shared).  Follow Up Plan:   Earney [facility] Sched[facility]

## 2020-01-02 ENCOUNTER — Other Ambulatory Visit: Payer: Self-pay | Admitting: Family Medicine

## 2020-01-03 NOTE — Telephone Encounter (Signed)
Rx was last refilled 11/29/19 for #90 with 0 refills. Patient was last seen in the office by Dr.Copland on 09/04/19 - and has no upcoming appts. Ok to refill?

## 2020-01-04 MED ORDER — HYDROCODONE-ACETAMINOPHEN 10-325 MG PO TABS: 1.0000 | ORAL_TABLET | Freq: Four times a day (QID) | ORAL | 0 refills | Status: DC | PRN

## 2020-01-04 NOTE — Telephone Encounter (Signed)
ERx 

## 2020-01-18 DIAGNOSIS — H5203 Hypermetropia, bilateral: Secondary | ICD-10-CM | POA: Diagnosis not present

## 2020-01-31 ENCOUNTER — Other Ambulatory Visit: Payer: Self-pay | Admitting: Family Medicine

## 2020-01-31 NOTE — Telephone Encounter (Signed)
Name of Medication: Hydrocodone-APAP Name of Pharmacy: [facility] Last Fill or Written Date and Quantity: 01/04/20, #90 Last Office Visit and Type: 08/29/19, AWV prt 2 Next Office Visit and Type: none Last Controlled Substance Agreement Date: 09/29/16 Last UDS: 03/10/19

## 2020-02-01 MED ORDER — HYDROCODONE-ACETAMINOPHEN 10-325 MG PO TABS: 1.0000 | ORAL_TABLET | Freq: Four times a day (QID) | ORAL | 0 refills | Status: DC | PRN

## 2020-02-01 NOTE — Telephone Encounter (Signed)
ERx 

## 2020-02-27 ENCOUNTER — Telehealth: Payer: Self-pay

## 2020-02-27 NOTE — Progress Notes (Signed)
Unsuccessful outreach to patient. Left voicemail return call.  Randy Kennedy, [place] Pharmacist Assistant  [phone]

## 2020-03-04 ENCOUNTER — Telehealth: Payer: Self-pay

## 2020-03-04 NOTE — Progress Notes (Signed)
Called patient for medication adherence call. Patient was not available to discuss this at this time. Requested a call back around 1230pm today.   Martinique Uselman, [place] Pharmacist Assistant  [phone]

## 2020-03-05 ENCOUNTER — Telehealth: Payer: Self-pay

## 2020-03-05 NOTE — Progress Notes (Signed)
Unsuccessful outreach to patient for medication adherence call. LVM 03/05/20 at 1029.  Randy Kennedy, [place] Pharmacist Assistant  [phone]

## 2020-03-06 ENCOUNTER — Other Ambulatory Visit: Payer: Self-pay | Admitting: Family Medicine

## 2020-03-07 MED ORDER — HYDROCODONE-ACETAMINOPHEN 10-325 MG PO TABS: 1.0000 | ORAL_TABLET | Freq: Four times a day (QID) | ORAL | 0 refills | Status: DC | PRN

## 2020-03-07 NOTE — Telephone Encounter (Signed)
ERx 

## 2020-03-07 NOTE — Telephone Encounter (Signed)
Name of Medication: Hydrocodone-APAP Name of Pharmacy: [facility] Last Fill or Written Date and Quantity: 02/01/20, #90 Last Office Visit and Type: 08/29/19, AWV prt 2 Next Office Visit and Type: none Last Controlled Substance Agreement Date: 10/09/16 Last UDS: 03/10/19

## 2020-03-21 ENCOUNTER — Telehealth: Payer: Self-pay

## 2020-03-21 NOTE — Progress Notes (Signed)
Chronic Care Management Pharmacy Assistant   Name: CYPRUS KUANG  MRN: [id number] DOB: 1950-11-22  Reason for Encounter: Medication Review  Patient Questions:  1.  Have you seen any other providers since your last visit? No  2.  Any changes in your medicines or health? No     PCP : [name], MD  Allergies:   Allergies  Allergen Reactions  . Allopurinol Other (See Comments)    GI upset  . Lisinopril-Hydrochlorothiazide     Burning in feet.  . Procardia [Nifedipine] Other (See Comments)    Bradycardia, leg swelling  . Spironolactone Other (See Comments)    malaise, dizziness  . Amlodipine Rash    BLE  . Doxazosin Mesylate Rash  . Nortriptyline Rash  . Uloric [Febuxostat] Rash    Medications: Outpatient Encounter Medications as of 03/21/2020  Medication Sig  . aspirin EC 81 MG tablet Take 81 mg by mouth daily.  . cetirizine (ZYRTEC) 10 MG tablet Take 10 mg by mouth at bedtime. PRN  . colchicine 0.6 MG tablet Take 1 tablet (0.6 mg total) by mouth daily.  Marland Kitchen gabapentin (NEURONTIN) 300 MG capsule Take 1 capsule (300 mg total) by mouth 2 (two) times daily AND 2 capsules (600 mg total) at bedtime.  . hydrALAZINE (APRESOLINE) 100 MG tablet TAKE ONE AND ONE-HALF TABLET BY MOUTH AS DIRECTED THREE TIMES DAILY. MAY INCREASE TO FOUR TIMES DAILY IF NEEDED FOR HIGH BLOOD PRESSURE  . HYDROcodone-acetaminophen (NORCO) 10-325 MG tablet Take 1 tablet by mouth every 6 (six) hours as needed.  . loratadine (CLARITIN) 10 MG tablet Take 1 tablet (10 mg total) by mouth daily. (Patient not taking: Reported on 09/07/2019)  . losartan (COZAAR) 100 MG tablet Take 1 tablet (100 mg total) by mouth daily.  . meloxicam (MOBIC) 15 MG tablet TAKE 1 TABLET BY MOUTH ONCE DAILY AS NEEDED FOR  GOUT  FLARE  . nitroGLYCERIN (NITROSTAT) 0.4 MG SL tablet Place 1 tablet (0.4 mg total) under the tongue every 5 (five) minutes as needed for chest pain.  . pravastatin (PRAVACHOL) 40 MG tablet Take 1  tablet (40 mg total) by mouth daily.  . predniSONE (DELTASONE) 20 MG tablet Take 1 tablet (20 mg total) by mouth daily. For 1-2 days prn gout flare  . predniSONE (DELTASONE) 20 MG tablet 2 tabs po for 7 days, then 1 tab po for 7 days  . probenecid (BENEMID) 500 MG tablet Take 1/2 (one-half) tablet by mouth twice daily  . vitamin C (ASCORBIC ACID) 500 MG tablet Take 500 mg by mouth daily.   No facility-administered encounter medications on file as of 03/21/2020.    Current Diagnosis: Patient Active Problem List   Diagnosis Date Noted  . Lateral pain of right hip 08/29/2019  . Pain and swelling of elbow, left 08/04/2018  . Shortness of breath 07/05/2018  . Allergic rhinitis 04/20/2018  . Right leg pain 10/07/2017  . Nasal congestion 05/19/2017  . Obesity, Class I, BMI 30-34.9 01/07/2017  . Encounter for chronic pain management 10/11/2016  . Dizziness 03/24/2016  . Chronic fatigue 03/20/2016  . Narcolepsy   . 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 Bunion 10/22/2015  . Medicare annual wellness visit, subsequent 08/14/2015  . Health maintenance examination 08/14/2015  . Advanced care planning/counseling discussion 08/14/2015  . Thrombocytopenia ([place]) 08/05/2015  . 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 History of ankle fracture 07/19/2013  . Drooping eyelid 03/09/2013  . Leg edema 02/16/2013  . Bradycardia 02/16/2013  . Erectile dysfunction  09/14/2012  . Gout 09/13/2012  . GERD (gastroesophageal reflux disease)   . HTN (hypertension)   . HLD (hyperlipidemia)   . OSA (obstructive sleep apnea)   . RLS (restless legs syndrome)   . Gilbert disease   . Narcotic dependence ([place])   . Systolic murmur     Goals Addressed   None     Follow-Up:  Coordination of Enhanced Pharmacy Services   Reviewed chart for medication changes ahead of medication coordination call.  No OVs, Consults, or hospital visits since last care coordination call/Pharmacist visit.   No medication changes indicated.  BP Readings  from Last 3 Encounters:  09/04/19 (!) 144/86  08/30/19 (!) 160/72  08/29/19 128/64    Lab Results  Component Value Date   HGBA1C 5.3 08/22/2019     Patient obtains medications through V[facility]  90 Days   Last adherence delivery included:  . Colchicine 0.6 mg - PRN . Prednisone 20 mg - PRN . Meloxicam 15 mg - PRN . Cetirizine 10 mg - PRN . Aspirin 81 mg - 1 daily  . Vitamin C 500 mg- 1 daily . Gabapentin 300 mg - 1 AM, 1 noon, 2 PM (has about 2 weeks left - takes PRN due to drowsiness) . Hydralazine 100 mg - 1.5 tabs TID  . Losartan 100 mg - 1 tablet daily  . Pravastatin 40 mg  - 1 tablet qpm  . Probenecid 500 mg  -  tablet BID . Hydrocodone/acetaminophen 10-325 mg - PRN - will call for next fill  Patient declined all medications last month due to 90 Day supply filled in 01/2020.  Patient is not currently due for medication adherence Delivery.  Called patient and reviewed medications.  This delivery to include: No medications to deliver this month. Patient declined the following medications due to 90 DS given 01/2020: . Colchicine 0.6 mg - PRN . Prednisone 20 mg - PRN . Meloxicam 15 mg - PRN . Cetirizine 10 mg - PRN . Aspirin 81 mg - 1 daily  . Vitamin C 500 mg- 1 daily . Gabapentin 300 mg - 1 AM, 1 noon, 2 PM (has about 2 weeks left - takes PRN due to drowsiness) . Hydralazine 100 mg - 1.5 tabs TID  . Losartan 100 mg - 1 tablet daily  . Pravastatin 40 mg  - 1 tablet qpm  . Probenecid 500 mg  -  tablet BID . Hydrocodone/acetaminophen 10-325 mg - PRN - will call for next fill    Advised patient that we will call ahead of next adherence delivery.  Martinique Uselman, [place] Pharmacist Assistant  [phone]

## 2020-03-21 NOTE — Progress Notes (Signed)
Unsuccessful outreach to patient. Left voice mail for patient to return call.  Martinique Uselman, [place] Pharmacist Assistant  [phone]

## 2020-04-02 ENCOUNTER — Other Ambulatory Visit: Payer: Self-pay | Admitting: Family Medicine

## 2020-04-03 NOTE — Telephone Encounter (Signed)
Name of Medication: Hydrocodone-APAP Name of Pharmacy: [facility] Last Fill or Written Date and Quantity: 03/07/20, #90 Last Office Visit and Type: 08/29/19, AWV prt 2 Next Office Visit and Type: none Last Controlled Substance Agreement Date: 09/29/16 Last UDS: 03/10/19

## 2020-04-04 MED ORDER — HYDROCODONE-ACETAMINOPHEN 10-325 MG PO TABS: 1.0000 | ORAL_TABLET | Freq: Four times a day (QID) | ORAL | 0 refills | Status: DC | PRN

## 2020-04-04 NOTE — Telephone Encounter (Signed)
ERx Please schedule chronic pain f/u visit

## 2020-04-19 NOTE — Telephone Encounter (Signed)
Called patient to schedule. No answer or vmail available.

## 2020-04-25 ENCOUNTER — Encounter: Payer: Self-pay | Admitting: Family Medicine

## 2020-04-25 NOTE — Telephone Encounter (Signed)
Mailed letter °

## 2020-04-25 NOTE — Telephone Encounter (Signed)
Noted  

## 2020-04-25 NOTE — Telephone Encounter (Signed)
Left patient vm to call back and schedule the appt.

## 2020-04-26 ENCOUNTER — Telehealth: Payer: Self-pay

## 2020-04-26 ENCOUNTER — Other Ambulatory Visit: Payer: Self-pay

## 2020-04-26 MED ORDER — HYDRALAZINE HCL 100 MG PO TABS: ORAL_TABLET | ORAL | 0 refills | Status: DC

## 2020-04-26 NOTE — Chronic Care Management (AMB) (Signed)
Chronic Care Management Pharmacy Assistant   Name: Randy Kennedy  MRN: [id number] DOB: [birth year]  Reason for Encounter: Medication Review  PCP : [name], MD  Allergies:   Allergies  Allergen Reactions  . Allopurinol Other (See Comments)    GI upset  . Lisinopril-Hydrochlorothiazide     Burning in feet.  . Procardia [Nifedipine] Other (See Comments)    Bradycardia, leg swelling  . Spironolactone Other (See Comments)    malaise, dizziness  . Amlodipine Rash    BLE  . Doxazosin Mesylate Rash  . Nortriptyline Rash  . Uloric [Febuxostat] Rash    Medications: Outpatient Encounter Medications as of 04/26/2020  Medication Sig  . aspirin EC 81 MG tablet Take 81 mg by mouth daily.  . cetirizine (ZYRTEC) 10 MG tablet Take 10 mg by mouth at bedtime. PRN  . colchicine 0.6 MG tablet Take 1 tablet (0.6 mg total) by mouth daily.  Marland Kitchen gabapentin (NEURONTIN) 300 MG capsule Take 1 capsule (300 mg total) by mouth 2 (two) times daily AND 2 capsules (600 mg total) at bedtime.  . hydrALAZINE (APRESOLINE) 100 MG tablet TAKE ONE AND ONE-HALF TABLET BY MOUTH AS DIRECTED THREE TIMES DAILY. MAY INCREASE TO FOUR TIMES DAILY IF NEEDED FOR HIGH BLOOD PRESSURE  . HYDROcodone-acetaminophen (NORCO) 10-325 MG tablet Take 1 tablet by mouth every 6 (six) hours as needed.  . loratadine (CLARITIN) 10 MG tablet Take 1 tablet (10 mg total) by mouth daily. (Patient not taking: Reported on 09/07/2019)  . losartan (COZAAR) 100 MG tablet Take 1 tablet (100 mg total) by mouth daily.  . meloxicam (MOBIC) 15 MG tablet TAKE 1 TABLET BY MOUTH ONCE DAILY AS NEEDED FOR  GOUT  FLARE  . nitroGLYCERIN (NITROSTAT) 0.4 MG SL tablet Place 1 tablet (0.4 mg total) under the tongue every 5 (five) minutes as needed for chest pain.  . pravastatin (PRAVACHOL) 40 MG tablet Take 1 tablet (40 mg total) by mouth daily.  . predniSONE (DELTASONE) 20 MG tablet Take 1 tablet (20 mg total) by mouth daily. For 1-2 days prn gout  flare  . predniSONE (DELTASONE) 20 MG tablet 2 tabs po for 7 days, then 1 tab po for 7 days  . probenecid (BENEMID) 500 MG tablet Take 1/2 (one-half) tablet by mouth twice daily  . vitamin C (ASCORBIC ACID) 500 MG tablet Take 500 mg by mouth daily.   No facility-administered encounter medications on file as of 04/26/2020.    Current Diagnosis: Patient Active Problem List   Diagnosis Date Noted  . Lateral pain of right hip 08/29/2019  . Pain and swelling of elbow, left 08/04/2018  . Shortness of breath 07/05/2018  . Allergic rhinitis 04/20/2018  . Right leg pain 10/07/2017  . Nasal congestion 05/19/2017  . Obesity, Class I, BMI 30-34.9 01/07/2017  . Encounter for chronic pain management 10/11/2016  . Dizziness 03/24/2016  . Chronic fatigue 03/20/2016  . Narcolepsy   . 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 Bunion 10/22/2015  . Medicare annual wellness visit, subsequent 08/14/2015  . Health maintenance examination 08/14/2015  . Advanced care planning/counseling discussion 08/14/2015  . Thrombocytopenia ([place]) 08/05/2015  . 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 History of ankle fracture 07/19/2013  . Drooping eyelid 03/09/2013  . Leg edema 02/16/2013  . Bradycardia 02/16/2013  . Erectile dysfunction 09/14/2012  . Gout 09/13/2012  . GERD (gastroesophageal reflux disease)   . HTN (hypertension)   . HLD (hyperlipidemia)   . OSA (obstructive sleep apnea)   .  RLS (restless legs syndrome)   . Gilbert disease   . Narcotic dependence ([place])   . Systolic murmur     Goals Addressed   None    Reviewed chart for medication changes ahead of medication coordination call.  No OVs, Consults, or hospital visits since last care coordination call/Pharmacist visit.  No medication changes indicated in chart.  BP Readings from Last 3 Encounters:  09/04/19 (!) 144/86  08/30/19 (!) 160/72  08/29/19 128/64    Lab Results  Component Value Date   HGBA1C 5.3 08/22/2019     Patient obtains medications through  Vials  90 Days   Last adherence delivery included: Patient did not need any refills at the last coordination call on 03/21/2020, Patient just received a 90 days supply of medication on 02/05/20.   Patient declined: Colchicine 0.6 mg -1 tablet daily PRN, Prednisone 20 mg - PRN, Meloxicam 15 mg - PRN, Cetirizine 10 mg - PRN, Aspirin 81 mg - 1 daily, Vitamin C 500 mg- 1 daily, Gabapentin 300 mg - 1 AM, 1 noon, 2 PM (has about 2 weeks left - takes PRN due to drowsiness), Hydralazine 100 mg - 1.5 tabs TID, Losartan 100 mg - 1 tablet daily, Pravastatin 40 mg  - 1 tablet qpm, Probenecid 500 mg  -  tablet BID, Hydrocodone/acetaminophen 10-325 mg - PRN - will call for next fill.   Patient is due for next adherence delivery on: 05/04/2020. Called patient and reviewed medications and coordinated delivery. By Wendy Poet, CPA  This delivery to include: Probenecid 500 mg  -  tablet BID, Hydralazine 100 mg - 1.5 tabs TID, Losartan 100 mg - 1 tablet daily, Pravastatin 40 mg  - 1 tablet at bedtime.  Patient declined the following medications :  Colchicine 0.6 mg -1 tablet daily PRN received on 04/23/20,  Gabapentin 300 mg - 1 tablet three times a day received on 03/18/20 for 90 day supply, Hydrocodone/acetaminophen 10-325 mg - 1 tablet every 6 hours as needed received on 04/08/20.  Patient needs refills for: Probenecid 500 mg  -  tablet BID, Hydralazine 100 mg - 1.5 tabs TID, Losartan 100 mg - 1 tablet daily, Pravastatin 40 mg  - 1 tablet at bedtime.  Confirmed delivery date of 05/04/2020, advised patient that pharmacy will contact them the morning of delivery.  Follow-Up:  Coordination of Enhanced Pharmacy Services and Pharmacist Review  Donette Larry, CPP notified of medications needed.  Pattricia Boss, [place] Pharmacist Assistant [phone]

## 2020-04-29 ENCOUNTER — Other Ambulatory Visit: Payer: Self-pay | Admitting: Family Medicine

## 2020-04-30 NOTE — Telephone Encounter (Signed)
Name of Medication: Hydrocodone-APAP Name of Pharmacy: [facility] Last Fill or Written Date and Quantity: 04/04/20, #90 Last Office Visit and Type: 08/29/19, AWV prt 2 Next Office Visit and Type: none Last Controlled Substance Agreement Date: 09/29/16 Last UDS: 03/10/19

## 2020-05-02 MED ORDER — HYDROCODONE-ACETAMINOPHEN 10-325 MG PO TABS
1.0000 | ORAL_TABLET | Freq: Four times a day (QID) | ORAL | 0 refills | Status: DC | PRN
Start: 2020-05-02 — End: 2020-06-03

## 2020-05-02 NOTE — Telephone Encounter (Signed)
ERx 

## 2020-05-24 ENCOUNTER — Telehealth: Payer: Self-pay

## 2020-05-24 NOTE — Chronic Care Management (AMB) (Signed)
Chronic Care Management Pharmacy Assistant   Name: Randy Kennedy  MRN: [id number] DOB: 1950/10/27  Medication Management - Medication Adherence.  PCP : [name], MD  Allergies:   Allergies  Allergen Reactions  . Allopurinol Other (See Comments)    GI upset  . Lisinopril-Hydrochlorothiazide     Burning in feet.  . Procardia [Nifedipine] Other (See Comments)    Bradycardia, leg swelling  . Spironolactone Other (See Comments)    malaise, dizziness  . Amlodipine Rash    BLE  . Doxazosin Mesylate Rash  . Nortriptyline Rash  . Uloric [Febuxostat] Rash    Medications: Outpatient Encounter Medications as of 05/24/2020  Medication Sig  . aspirin EC 81 MG tablet Take 81 mg by mouth daily.  . cetirizine (ZYRTEC) 10 MG tablet Take 10 mg by mouth at bedtime. PRN  . colchicine 0.6 MG tablet Take 1 tablet (0.6 mg total) by mouth daily.  Marland Kitchen gabapentin (NEURONTIN) 300 MG capsule Take 1 capsule (300 mg total) by mouth 2 (two) times daily AND 2 capsules (600 mg total) at bedtime.  . hydrALAZINE (APRESOLINE) 100 MG tablet TAKE ONE AND ONE-HALF TABLET BY MOUTH AS DIRECTED THREE TIMES DAILY. MAY INCREASE TO FOUR TIMES DAILY IF NEEDED FOR HIGH BLOOD PRESSURE  . HYDROcodone-acetaminophen (NORCO) 10-325 MG tablet Take 1 tablet by mouth every 6 (six) hours as needed.  . loratadine (CLARITIN) 10 MG tablet Take 1 tablet (10 mg total) by mouth daily. (Patient not taking: Reported on 09/07/2019)  . losartan (COZAAR) 100 MG tablet Take 1 tablet (100 mg total) by mouth daily.  . meloxicam (MOBIC) 15 MG tablet TAKE 1 TABLET BY MOUTH ONCE DAILY AS NEEDED FOR  GOUT  FLARE  . nitroGLYCERIN (NITROSTAT) 0.4 MG SL tablet Place 1 tablet (0.4 mg total) under the tongue every 5 (five) minutes as needed for chest pain.  . pravastatin (PRAVACHOL) 40 MG tablet Take 1 tablet (40 mg total) by mouth daily.  . predniSONE (DELTASONE) 20 MG tablet Take 1 tablet (20 mg total) by mouth daily. For 1-2 days prn  gout flare  . predniSONE (DELTASONE) 20 MG tablet 2 tabs po for 7 days, then 1 tab po for 7 days  . probenecid (BENEMID) 500 MG tablet Take 1/2 (one-half) tablet by mouth twice daily  . vitamin C (ASCORBIC ACID) 500 MG tablet Take 500 mg by mouth daily.   No facility-administered encounter medications on file as of 05/24/2020.    Current Diagnosis: Patient Active Problem List   Diagnosis Date Noted  . Lateral pain of right hip 08/29/2019  . Pain and swelling of elbow, left 08/04/2018  . Shortness of breath 07/05/2018  . Allergic rhinitis 04/20/2018  . Right leg pain 10/07/2017  . Nasal congestion 05/19/2017  . Obesity, Class I, BMI 30-34.9 01/07/2017  . Encounter for chronic pain management 10/11/2016  . Dizziness 03/24/2016  . Chronic fatigue 03/20/2016  . Narcolepsy   . 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 Bunion 10/22/2015  . Medicare annual wellness visit, subsequent 08/14/2015  . Health maintenance examination 08/14/2015  . Advanced care planning/counseling discussion 08/14/2015  . Thrombocytopenia ([place]) 08/05/2015  . 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 History of ankle fracture 07/19/2013  . Drooping eyelid 03/09/2013  . Leg edema 02/16/2013  . Bradycardia 02/16/2013  . Erectile dysfunction 09/14/2012  . Gout 09/13/2012  . GERD (gastroesophageal reflux disease)   . HTN (hypertension)   . HLD (hyperlipidemia)   . OSA (obstructive sleep apnea)   .  RLS (restless legs syndrome)   . Gilbert disease   . Narcotic dependence ([place])   . Systolic murmur       Follow-Up:  Pharmacist Review - Reviewed chart and adherence measures . Per [employer], medication adherence for cholesterol (statins) 90-99% med compliance . Medication adherence for hypertension 90-99% med compliance.  Elly Modena Brown,CPP notified   Judithann Sheen, [place] Pharmacist Assistant [phone]

## 2020-05-29 ENCOUNTER — Telehealth: Payer: Self-pay

## 2020-05-29 NOTE — Progress Notes (Addendum)
Chronic Care Management Pharmacy Assistant   Name: BRENIN HEIDELBERGER  MRN: [id number] DOB: 1951-03-30  Reason for Encounter: Medication Review  Patient Questions:  1.  Have you seen any other providers since your last visit? No  2.  Any changes in your medicines or health? No   [name],  69 y.o. , male presents for their Follow-Up CCM visit with the clinical pharmacist via telephone.  PCP : [name], MD  Allergies:   Allergies  Allergen Reactions   Allopurinol Other (See Comments)    GI upset   Lisinopril-Hydrochlorothiazide     Burning in feet.   Procardia [Nifedipine] Other (See Comments)    Bradycardia, leg swelling   Spironolactone Other (See Comments)    malaise, dizziness   Amlodipine Rash    BLE   Doxazosin Mesylate Rash   Nortriptyline Rash   Uloric [Febuxostat] Rash    Medications: Outpatient Encounter Medications as of 05/29/2020  Medication Sig   aspirin EC 81 MG tablet Take 81 mg by mouth daily.   cetirizine (ZYRTEC) 10 MG tablet Take 10 mg by mouth at bedtime. PRN   colchicine 0.6 MG tablet Take 1 tablet (0.6 mg total) by mouth daily.   gabapentin (NEURONTIN) 300 MG capsule Take 1 capsule (300 mg total) by mouth 2 (two) times daily AND 2 capsules (600 mg total) at bedtime.   hydrALAZINE (APRESOLINE) 100 MG tablet TAKE ONE AND ONE-HALF TABLET BY MOUTH AS DIRECTED THREE TIMES DAILY. MAY INCREASE TO FOUR TIMES DAILY IF NEEDED FOR HIGH BLOOD PRESSURE   HYDROcodone-acetaminophen (NORCO) 10-325 MG tablet Take 1 tablet by mouth every 6 (six) hours as needed.   loratadine (CLARITIN) 10 MG tablet Take 1 tablet (10 mg total) by mouth daily. (Patient not taking: Reported on 09/07/2019)   losartan (COZAAR) 100 MG tablet Take 1 tablet (100 mg total) by mouth daily.   meloxicam (MOBIC) 15 MG tablet TAKE 1 TABLET BY MOUTH ONCE DAILY AS NEEDED FOR  GOUT  FLARE   nitroGLYCERIN (NITROSTAT) 0.4 MG SL tablet Place 1 tablet (0.4 mg total) under the tongue  every 5 (five) minutes as needed for chest pain.   pravastatin (PRAVACHOL) 40 MG tablet Take 1 tablet (40 mg total) by mouth daily.   predniSONE (DELTASONE) 20 MG tablet Take 1 tablet (20 mg total) by mouth daily. For 1-2 days prn gout flare   predniSONE (DELTASONE) 20 MG tablet 2 tabs po for 7 days, then 1 tab po for 7 days   probenecid (BENEMID) 500 MG tablet Take 1/2 (one-half) tablet by mouth twice daily   vitamin C (ASCORBIC ACID) 500 MG tablet Take 500 mg by mouth daily.   No facility-administered encounter medications on file as of 05/29/2020.   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   Bunion 10/22/2015   Medicare annual wellness visit, subsequent 08/14/2015   Health maintenance examination 08/14/2015   Advanced care planning/counseling discussion 08/14/2015   Thrombocytopenia ([place]) 08/05/2015   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   History of ankle fracture 07/19/2013  Drooping eyelid 03/09/2013   Leg edema 02/16/2013   Bradycardia 02/16/2013   Erectile dysfunction 09/14/2012   Gout 09/13/2012   GERD (gastroesophageal reflux disease)    HTN (hypertension)    HLD (hyperlipidemia)    OSA (obstructive sleep apnea)    RLS (restless legs syndrome)    [name] disease    Narcotic dependence ([place])    Systolic murmur     Follow-Up:  Coordination of Enhanced Pharmacy Services   Reviewed chart for medication changes ahead of medication coordination call.  No OVs, Consults, or hospital visits since last care coordination call/Pharmacist visit  No medication changes  indicated.  BP Readings from Last 3 Encounters:  09/04/19 (!) 144/86  08/30/19 (!) 160/72  08/29/19 128/64    Lab Results  Component Value Date   HGBA1C 5.3 08/22/2019    Patient obtains medications through Vials  90 Days   Last adherence delivery included: (delivered 05/04/20 90 DS) Probenecid 500 mg  -  tablet BID Hydralazine 100 mg - 1.5 tabs TID Losartan 100 mg - 1 tablet daily Pravastatin 40 mg  - 1 tablet at bedtime  Patient declined Colchicine 0.6 mg -1 tablet daily PRN received on 04/23/20,  Gabapentin 300 mg - 1 tablet three times a day received on 03/18/20 for 90 day supply, Hydrocodone/acetaminophen 10-325 mg - 1 tablet every 6 hours as needed received on 04/08/20  Patient is due for next adherence delivery on: 08/05/2019.  Called patient and reviewed medications and coordinated delivery.  This delivery to include: NONE  Unable to reach patient by telephone. No medication due for refill at this time.  I have reviewed the care management and care coordination activities outlined in this encounter and I am certifying that I agree with the content of this note. No further action required.  [name], PharmD Clinical Pharmacist [facility] [phone]

## 2020-06-03 ENCOUNTER — Other Ambulatory Visit: Payer: Self-pay | Admitting: Family Medicine

## 2020-06-04 MED ORDER — HYDROCODONE-ACETAMINOPHEN 10-325 MG PO TABS
1.0000 | ORAL_TABLET | Freq: Four times a day (QID) | ORAL | 0 refills | Status: DC | PRN
Start: 2020-06-04 — End: 2020-06-29

## 2020-06-04 NOTE — Telephone Encounter (Signed)
ERx 

## 2020-06-04 NOTE — Telephone Encounter (Signed)
Name of Medication: Hydrocodone-APAP Name of Pharmacy: [facility] Last Fill or Written Date and Quantity: 05/02/20, #90 Last Office Visit and Type: 08/29/19, AWV prt 2 Next Office Visit and Type: none Last Controlled Substance Agreement Date: 09/29/16 Last UDS: 03/10/19

## 2020-06-06 ENCOUNTER — Telehealth: Payer: Self-pay

## 2020-06-06 NOTE — Telephone Encounter (Signed)
activities as tolerated 

## 2020-06-26 DIAGNOSIS — U071 COVID-19: Secondary | ICD-10-CM

## 2020-06-26 HISTORY — DX: COVID-19: U07.1

## 2020-06-27 ENCOUNTER — Telehealth: Payer: Self-pay

## 2020-06-27 NOTE — Progress Notes (Signed)
06/24/2020 left voice mail to return call 06/27/2020 left voice mail to return call.

## 2020-06-29 ENCOUNTER — Other Ambulatory Visit: Payer: Self-pay | Admitting: Family Medicine

## 2020-07-01 NOTE — Telephone Encounter (Signed)
Name of Medication: Hydrocodone Name of Pharmacy: [facility] Last Fill or Written Date and Quantity: 06/04/20 #90 Last Office Visit and Type: 09/04/19 Dr. Lorelei Pont Next Office Visit and Type: none scheduled Last Controlled Substance Agreement Date: 09/29/16 Last UDS:03/10/19

## 2020-07-02 MED ORDER — HYDROCODONE-ACETAMINOPHEN 10-325 MG PO TABS
1.0000 | ORAL_TABLET | Freq: Four times a day (QID) | ORAL | 0 refills | Status: DC | PRN
Start: 2020-07-02 — End: 2020-07-24

## 2020-07-02 NOTE — Telephone Encounter (Signed)
ERx 

## 2020-07-05 ENCOUNTER — Telehealth: Payer: Self-pay

## 2020-07-05 NOTE — Chronic Care Management (AMB) (Addendum)
Chronic Care Management Pharmacy Assistant   Name: KHI MCMILLEN  MRN: [id number] DOB: 23-Jun-1951  Reason for Encounter: Medication Review  Patient Questions:  1.  Have you seen any other providers since your last visit? No  2.  Any changes in your medicines or health? No   PCP : [name], MD  Allergies:   Allergies  Allergen Reactions   Allopurinol Other (See Comments)    GI upset   Lisinopril-Hydrochlorothiazide     Burning in feet.   Procardia [Nifedipine] Other (See Comments)    Bradycardia, leg swelling   Spironolactone Other (See Comments)    malaise, dizziness   Amlodipine Rash    BLE   Doxazosin Mesylate Rash   Nortriptyline Rash   Uloric [Febuxostat] Rash    Medications: Outpatient Encounter Medications as of 07/05/2020  Medication Sig   aspirin EC 81 MG tablet Take 81 mg by mouth daily.   cetirizine (ZYRTEC) 10 MG tablet Take 10 mg by mouth at bedtime. PRN   colchicine 0.6 MG tablet Take 1 tablet (0.6 mg total) by mouth daily.   gabapentin (NEURONTIN) 300 MG capsule Take 1 capsule (300 mg total) by mouth 2 (two) times daily AND 2 capsules (600 mg total) at bedtime.   hydrALAZINE (APRESOLINE) 100 MG tablet TAKE ONE AND ONE-HALF TABLET BY MOUTH AS DIRECTED THREE TIMES DAILY. MAY INCREASE TO FOUR TIMES DAILY IF NEEDED FOR HIGH BLOOD PRESSURE   HYDROcodone-acetaminophen (NORCO) 10-325 MG tablet Take 1 tablet by mouth every 6 (six) hours as needed.   loratadine (CLARITIN) 10 MG tablet Take 1 tablet (10 mg total) by mouth daily. (Patient not taking: Reported on 09/07/2019)   losartan (COZAAR) 100 MG tablet Take 1 tablet (100 mg total) by mouth daily.   meloxicam (MOBIC) 15 MG tablet TAKE 1 TABLET BY MOUTH ONCE DAILY AS NEEDED FOR  GOUT  FLARE   nitroGLYCERIN (NITROSTAT) 0.4 MG SL tablet Place 1 tablet (0.4 mg total) under the tongue every 5 (five) minutes as needed for chest pain.   pravastatin (PRAVACHOL) 40 MG tablet Take 1 tablet (40 mg total) by  mouth daily.   predniSONE (DELTASONE) 20 MG tablet Take 1 tablet (20 mg total) by mouth daily. For 1-2 days prn gout flare   predniSONE (DELTASONE) 20 MG tablet 2 tabs po for 7 days, then 1 tab po for 7 days   probenecid (BENEMID) 500 MG tablet Take 1/2 (one-half) tablet by mouth twice daily   vitamin C (ASCORBIC ACID) 500 MG tablet Take 500 mg by mouth daily.   No facility-administered encounter medications on file as of 07/05/2020.    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   Bunion 10/22/2015   Medicare annual wellness visit, subsequent 08/14/2015   Health maintenance examination 08/14/2015   Advanced care planning/counseling discussion 08/14/2015   Thrombocytopenia ([place]) 08/05/2015   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   History of ankle fracture 07/19/2013   Drooping eyelid 03/09/2013   Leg edema 02/16/2013   Bradycardia 02/16/2013   Erectile dysfunction 09/14/2012  Gout 09/13/2012   GERD (gastroesophageal reflux disease)    HTN (hypertension)    HLD (hyperlipidemia)    OSA (obstructive sleep apnea)    RLS (restless legs syndrome)    [name] disease    Narcotic dependence ([place])    Systolic murmur    41/75/30 - Contact was attempted again to complete medication adherence and dispensing call. Unable able to reach.  Follow-Up:  Comptroller and Pharmacist Review   [name], CPP notified  Margaretmary Dys, [place] Pharmacy Assistant [phone]

## 2020-07-22 ENCOUNTER — Telehealth: Payer: Self-pay

## 2020-07-22 NOTE — Chronic Care Management (AMB) (Addendum)
Chronic Care Management Pharmacy Assistant   Name: Randy Kennedy  MRN: [id number] DOB: 1951/01/07  Reason for Encounter: Medication Review  Patient Questions:  1.  Have you seen any other providers since your last visit? No  2.  Any changes in your medicines or health? No  PCP : Eustaquio Boyden, MD  Allergies:   Allergies  Allergen Reactions   Allopurinol Other (See Comments)    GI upset   Lisinopril-Hydrochlorothiazide     Burning in feet.   Procardia [Nifedipine] Other (See Comments)    Bradycardia, leg swelling   Spironolactone Other (See Comments)    malaise, dizziness   Amlodipine Rash    BLE   Doxazosin Mesylate Rash   Nortriptyline Rash   Uloric [Febuxostat] Rash    Medications: Outpatient Encounter Medications as of 07/22/2020  Medication Sig   aspirin EC 81 MG tablet Take 81 mg by mouth daily.   cetirizine (ZYRTEC) 10 MG tablet Take 10 mg by mouth at bedtime. PRN   colchicine 0.6 MG tablet Take 1 tablet (0.6 mg total) by mouth daily.   gabapentin (NEURONTIN) 300 MG capsule Take 1 capsule (300 mg total) by mouth 2 (two) times daily AND 2 capsules (600 mg total) at bedtime.   hydrALAZINE (APRESOLINE) 100 MG tablet TAKE ONE AND ONE-HALF TABLET BY MOUTH AS DIRECTED THREE TIMES DAILY. MAY INCREASE TO FOUR TIMES DAILY IF NEEDED FOR HIGH BLOOD PRESSURE   loratadine (CLARITIN) 10 MG tablet Take 1 tablet (10 mg total) by mouth daily. (Patient not taking: Reported on 09/07/2019)   meloxicam (MOBIC) 15 MG tablet TAKE 1 TABLET BY MOUTH ONCE DAILY AS NEEDED FOR  GOUT  FLARE   nitroGLYCERIN (NITROSTAT) 0.4 MG SL tablet Place 1 tablet (0.4 mg total) under the tongue every 5 (five) minutes as needed for chest pain.   pravastatin (PRAVACHOL) 40 MG tablet Take 1 tablet (40 mg total) by mouth daily.   predniSONE (DELTASONE) 20 MG tablet Take 1 tablet (20 mg total) by mouth daily. For 1-2 days prn gout flare   predniSONE (DELTASONE) 20 MG tablet 2 tabs po for 7 days, then 1  tab po for 7 days   probenecid (BENEMID) 500 MG tablet Take 1/2 (one-half) tablet by mouth twice daily   vitamin C (ASCORBIC ACID) 500 MG tablet Take 500 mg by mouth daily.   [DISCONTINUED] HYDROcodone-acetaminophen (NORCO) 10-325 MG tablet Take 1 tablet by mouth every 6 (six) hours as needed.   [DISCONTINUED] losartan (COZAAR) 100 MG tablet Take 1 tablet (100 mg total) by mouth daily.   No facility-administered encounter medications on file as of 07/22/2020.    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   Bunion 10/22/2015   Medicare annual wellness visit, subsequent 08/14/2015   Health maintenance examination 08/14/2015   Advanced care planning/counseling discussion 08/14/2015   Thrombocytopenia (HCC) 08/05/2015   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   History of ankle fracture 07/19/2013   Drooping eyelid 03/09/2013   Leg edema 02/16/2013   Bradycardia 02/16/2013   Erectile dysfunction 09/14/2012  Gout 09/13/2012   GERD (gastroesophageal reflux disease)    HTN (hypertension)    HLD (hyperlipidemia)    OSA (obstructive sleep apnea)    RLS (restless legs syndrome)    [name] disease    Narcotic dependence ([place])    Systolic murmur    Reviewed chart for medication changes ahead of medication coordination call.  No OVs, Consults, or hospital visits since last care coordination call/Pharmacist visit.   No medication changes indicated.  BP Readings from Last 3 Encounters:  09/04/19 (!) 144/86  08/30/19 (!) 160/72  08/29/19 128/64    Lab Results  Component Value Date    HGBA1C 5.3 08/22/2019    Unable to reach patient - coordinated delivery based on meds due for refill.   07/22/20 [facility] 07/25/20 [facility]  07/29/20 [place]  Patient obtains medications through Vials  90 Days   Last adherence delivery included:  05/02/20, 90 DS Probenecid 500 mg  -  tablet BID Losartan 100 mg - 1 tablet daily Pravastatin 40 mg  - 1 tablet at bedtime  05/02/20 30 DS Hydralazine 100 mg - 1.5 tabs TID  07/03/20 23 DS Hydrocodone/acetaminophen 10-325 mg - 1 tablet every 6 hours as needed #23 DS rece  Patient declined the following medications last month: Colchicine 0.6 mg -1 tablet daily PRN received on 04/23/20 Gabapentin 300 mg - 1 tablet three times a day received on 03/18/20 for 90 day supply  Patient is due for next adherence delivery on: 07/31/2020  This delivery to include: 90 DS: Probenecid 500 mg  -  tablet BID Losartan 100 mg - 1 tablet daily Pravastatin 40 mg  - 1 tablet at bedtime Hydrocodone 10/325 mg -1 q6h PRN  Patient needs refills for: Hydralazine 100mg - Dr. Rockey Situ (must see provider for refills) Hydrocodone 10/325 Dr. Danise Mina Losartan 100 mg Dr. Danise Mina  Follow-Up:  Coordination of Enhanced Pharmacy Services and Pharmacist Review   [name], CPP notified  Margaretmary Dys, [place] Assistant [phone]  I have reviewed the care management and care coordination activities outlined in this encounter and I am certifying that I agree with the content of this note. No further action required.  [name], PharmD Clinical Pharmacist [facility] [phone]

## 2020-07-24 ENCOUNTER — Other Ambulatory Visit: Payer: Self-pay

## 2020-07-24 ENCOUNTER — Telehealth: Payer: Self-pay

## 2020-07-24 ENCOUNTER — Encounter: Payer: Self-pay | Admitting: Family Medicine

## 2020-07-24 MED ORDER — LOSARTAN POTASSIUM 100 MG PO TABS
100.0000 mg | ORAL_TABLET | Freq: Every day | ORAL | 0 refills | Status: DC
Start: 2020-07-24 — End: 2020-10-22

## 2020-07-24 NOTE — Telephone Encounter (Signed)
-----   Message from [place], [facility] sent at 07/24/2020 10:29 AM EST ----- Regarding: Refills Patient needs a refill on hydrocodone and losartan for next adherence delivery. Please send to [facility].  Thanks!  Phil Dopp, PharmD Clinical Pharmacist [place] [facility] at [facility] [phone]

## 2020-07-24 NOTE — Telephone Encounter (Signed)
Name of Medication: Hydrocodone-APAP Name of Pharmacy: [facility] Last Fill or Written Date and Quantity: 07/02/20, #90 Last Office Visit and Type: 08/29/19, AWV prt 2 Next Office Visit and Type: none Last Controlled Substance Agreement Date: 09/29/16 Last UDS: 03/10/19  Losartan refill sent.

## 2020-07-24 NOTE — Telephone Encounter (Signed)
Plz schedule wellness, lab and cpe visits.

## 2020-07-24 NOTE — Telephone Encounter (Signed)
Left vm for the patient to call us back. NO details left as no dpr to leave a detailed message on [phone] # . Needs to schedule MWV,CPE and labs. EM

## 2020-07-26 MED ORDER — HYDROCODONE-ACETAMINOPHEN 10-325 MG PO TABS
1.0000 | ORAL_TABLET | Freq: Four times a day (QID) | ORAL | 0 refills | Status: DC | PRN
Start: 2020-07-26 — End: 2020-08-23

## 2020-07-26 NOTE — Telephone Encounter (Signed)
ERx 

## 2020-08-04 ENCOUNTER — Encounter: Payer: Self-pay | Admitting: Family Medicine

## 2020-08-06 NOTE — Telephone Encounter (Signed)
Lvm asking pt to call back.  Need to get more details on pt's sxs.  Schedule OV [see Dr. [name] msg below) or virtual as appropriate.

## 2020-08-06 NOTE — Telephone Encounter (Signed)
Please triage patient and offer appointment today. Could see in office if no significant respiratory symptoms given illness present for 2+ wks.

## 2020-08-08 ENCOUNTER — Telehealth: Payer: Self-pay | Admitting: Family Medicine

## 2020-08-08 NOTE — Telephone Encounter (Signed)
[name], no one has slots available today or tomorrow.  Do you see anywhere we squeeze pt in for OV with you tomorrow?

## 2020-08-08 NOTE — Telephone Encounter (Signed)
Pt called in wanted to get a referral for a neurologist around [facility]

## 2020-08-09 NOTE — Telephone Encounter (Signed)
Please schedule for Monday

## 2020-08-09 NOTE — Telephone Encounter (Signed)
Patient scheduled for Monday at 12:00

## 2020-08-09 NOTE — Telephone Encounter (Signed)
May add him at 1pm

## 2020-08-09 NOTE — Telephone Encounter (Signed)
Noted  

## 2020-08-12 ENCOUNTER — Ambulatory Visit: Payer: PPO | Admitting: Family Medicine

## 2020-08-22 ENCOUNTER — Telehealth: Payer: Self-pay

## 2020-08-22 NOTE — Chronic Care Management (AMB) (Addendum)
Chronic Care Management Pharmacy Assistant   Name: Randy Kennedy  MRN: [id number] DOB: [birth year]  Reason for Encounter: Medication Review- Medication Adherence and Delivery Coordination  Patient Questions:  1.  Have you seen any other providers since your last visit? No  2.  Any changes in your medicines or health? No    PCP : [name], MD  Allergies:   Allergies  Allergen Reactions   Allopurinol Other (See Comments)    GI upset   Lisinopril-Hydrochlorothiazide     Burning in feet.   Procardia [Nifedipine] Other (See Comments)    Bradycardia, leg swelling   Spironolactone Other (See Comments)    malaise, dizziness   Amlodipine Rash    BLE   Doxazosin Mesylate Rash   Nortriptyline Rash   Uloric [Febuxostat] Rash    Medications: Outpatient Encounter Medications as of 08/22/2020  Medication Sig   aspirin EC 81 MG tablet Take 81 mg by mouth daily.   cetirizine (ZYRTEC) 10 MG tablet Take 10 mg by mouth at bedtime. PRN   colchicine 0.6 MG tablet Take 1 tablet (0.6 mg total) by mouth daily.   gabapentin (NEURONTIN) 300 MG capsule Take 1 capsule (300 mg total) by mouth 2 (two) times daily AND 2 capsules (600 mg total) at bedtime.   hydrALAZINE (APRESOLINE) 100 MG tablet TAKE ONE AND ONE-HALF TABLET BY MOUTH AS DIRECTED THREE TIMES DAILY. MAY INCREASE TO FOUR TIMES DAILY IF NEEDED FOR HIGH BLOOD PRESSURE   HYDROcodone-acetaminophen (NORCO) 10-325 MG tablet Take 1 tablet by mouth every 6 (six) hours as needed.   loratadine (CLARITIN) 10 MG tablet Take 1 tablet (10 mg total) by mouth daily. (Patient not taking: Reported on 09/07/2019)   losartan (COZAAR) 100 MG tablet Take 1 tablet (100 mg total) by mouth daily.   meloxicam (MOBIC) 15 MG tablet TAKE 1 TABLET BY MOUTH ONCE DAILY AS NEEDED FOR  GOUT  FLARE   nitroGLYCERIN (NITROSTAT) 0.4 MG SL tablet Place 1 tablet (0.4 mg total) under the tongue every 5 (five) minutes as needed for chest pain.   pravastatin  (PRAVACHOL) 40 MG tablet Take 1 tablet (40 mg total) by mouth daily.   predniSONE (DELTASONE) 20 MG tablet Take 1 tablet (20 mg total) by mouth daily. For 1-2 days prn gout flare   predniSONE (DELTASONE) 20 MG tablet 2 tabs po for 7 days, then 1 tab po for 7 days   probenecid (BENEMID) 500 MG tablet Take 1/2 (one-half) tablet by mouth twice daily   vitamin C (ASCORBIC ACID) 500 MG tablet Take 500 mg by mouth daily.   No facility-administered encounter medications on file as of 08/22/2020.    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   Bunion 10/22/2015   Medicare annual wellness visit, subsequent 08/14/2015   Health maintenance examination 08/14/2015   Advanced care planning/counseling discussion 08/14/2015   Thrombocytopenia ([place]) 08/05/2015   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   History of ankle fracture 07/19/2013   Drooping eyelid 03/09/2013   Leg edema 02/16/2013   Bradycardia 02/16/2013  Erectile dysfunction 09/14/2012   Gout 09/13/2012   GERD (gastroesophageal reflux disease)    HTN (hypertension)    HLD (hyperlipidemia)    OSA (obstructive sleep apnea)    RLS (restless legs syndrome)    [name] disease    Narcotic dependence ([place])    Systolic murmur    Reviewed chart for medication changes ahead of medication coordination call.  No OVs, Consults, or hospital visits since last care coordination call/Pharmacist visit.   No medication changes indicated.  BP Readings from Last 3 Encounters:  09/04/19 (!) 144/86  08/30/19 (!) 160/72  08/29/19 128/64    Lab  Results  Component Value Date   HGBA1C 5.3 08/22/2019    Unable to reach patient despite multiple attempts on 08/22/20, 08/23/20, 08/26/20 [name] reached patient 08/26/20 while he was in office to see [name] - he requested gabapentin and hydralazine refill.   Patient obtains medications through Vials  90 Days   Last adherence delivery included: 90 DS 07/31/20 Losartan 100 mg - 1 tablet daily   Pravastatin 40 mg  - 1 tablet qpm   Probenecid 500 mg  -  tablet BID  PRN Hydrocodone/acetaminophen 10-325 mg - PRN   Patient declined the following medications last month: Aspirin 81 mg - 1 daily OTC  Cetirizine 10 mg - PRN  Colchicine 0.6 mg - PRN  Gabapentin 300 mg - 1 AM, 1 noon, 2 PM (takes PRN) Meloxicam 15 mg - PRN  Prednisone 20 mg - PRN  Vitamin C 500 mg- 1 daily OTC  Hydralazine - pt did not answer and unclear if taking   Patient is due for next adherence delivery on: 08/27/20  This delivery to include:  Hydralazine 100 mg - 1 and 1/2 tablet TID Gabapentin 300 mg - 1 AM, 1 noon, 2 PM (takes PRN)   Follow-Up:  Coordination of Enhanced Pharmacy Services and Pharmacist Review   [name], CPP notified  Margaretmary Dys, [place] Assistant [phone]  I have reviewed the care management and care coordination activities outlined in this encounter and I am certifying that I agree with the content of this note. Patient reports missing mid-day dose hydralazine frequently. He has not answered the past few months adherence calls so we did not catch this sooner. CMA will call for BP review next month.   [name], PharmD Clinical Pharmacist [place] [facility] at [facility] [phone]

## 2020-08-23 ENCOUNTER — Other Ambulatory Visit: Payer: Self-pay

## 2020-08-23 NOTE — Telephone Encounter (Signed)
-----   Message from [name], [facility] sent at 08/23/2020 10:49 AM EST ----- Regarding: Refill Patient requesting hydrocodone 10/325 mg refill to [facility].  Thanks,  [name], PharmD Clinical Pharmacist [place] [facility] at [facility] [phone]

## 2020-08-23 NOTE — Telephone Encounter (Signed)
Name of Medication: Hydrocodone-APAP Name of Pharmacy: [facility] or Written Date and Quantity: 07/26/20, #90 Last Office Visit and Type: 08/29/19, AWV prt 2 Next Office Visit and Type: 08/26/20, nerve damage Last Controlled Substance Agreement Date: 09/29/16 Last UDS: 03/10/19

## 2020-08-26 ENCOUNTER — Other Ambulatory Visit: Payer: Self-pay | Admitting: Cardiovascular Disease

## 2020-08-26 ENCOUNTER — Encounter: Payer: Self-pay | Admitting: Family Medicine

## 2020-08-26 ENCOUNTER — Other Ambulatory Visit: Payer: Self-pay

## 2020-08-26 ENCOUNTER — Ambulatory Visit (INDEPENDENT_AMBULATORY_CARE_PROVIDER_SITE_OTHER)
Admission: RE | Admit: 2020-08-26 | Discharge: 2020-08-26 | Disposition: A | Discharge status: Home / Self Care | Payer: PPO | Source: Ambulatory Visit | Attending: Family Medicine | Admitting: Family Medicine

## 2020-08-26 ENCOUNTER — Ambulatory Visit (INDEPENDENT_AMBULATORY_CARE_PROVIDER_SITE_OTHER): Payer: PPO | Admitting: Family Medicine

## 2020-08-26 ENCOUNTER — Telehealth: Payer: Self-pay

## 2020-08-26 VITALS — BP 170/86 | HR 63 | Temp 97.6°F | Ht 65.5 in | Wt 225.5 lb

## 2020-08-26 DIAGNOSIS — G90522 Complex regional pain syndrome I of left lower limb: Secondary | ICD-10-CM

## 2020-08-26 DIAGNOSIS — M1A09X0 Idiopathic chronic gout, multiple sites, without tophus (tophi): Secondary | ICD-10-CM | POA: Diagnosis not present

## 2020-08-26 DIAGNOSIS — M791 Myalgia, unspecified site: Secondary | ICD-10-CM

## 2020-08-26 DIAGNOSIS — R208 Other disturbances of skin sensation: Secondary | ICD-10-CM

## 2020-08-26 DIAGNOSIS — G8929 Other chronic pain: Secondary | ICD-10-CM | POA: Diagnosis not present

## 2020-08-26 DIAGNOSIS — G894 Chronic pain syndrome: Secondary | ICD-10-CM | POA: Diagnosis not present

## 2020-08-26 DIAGNOSIS — R059 Cough, unspecified: Secondary | ICD-10-CM

## 2020-08-26 DIAGNOSIS — Z8781 Personal history of (healed) traumatic fracture: Secondary | ICD-10-CM

## 2020-08-26 DIAGNOSIS — G2581 Restless legs syndrome: Secondary | ICD-10-CM | POA: Diagnosis not present

## 2020-08-26 DIAGNOSIS — I10 Essential (primary) hypertension: Secondary | ICD-10-CM | POA: Diagnosis not present

## 2020-08-26 DIAGNOSIS — I517 Cardiomegaly: Secondary | ICD-10-CM | POA: Diagnosis not present

## 2020-08-26 LAB — CBC WITH DIFFERENTIAL/PLATELET
Basophils Absolute: 0.1 10*3/uL (ref 0.0–0.1)
Basophils Relative: 1.1 % (ref 0.0–3.0)
Eosinophils Absolute: 0.3 10*3/uL (ref 0.0–0.7)
Eosinophils Relative: 5.1 % — ABNORMAL HIGH (ref 0.0–5.0)
HCT: 42.6 % (ref 39.0–52.0)
Hemoglobin: 14.6 g/dL (ref 13.0–17.0)
Lymphocytes Relative: 26.4 % (ref 12.0–46.0)
Lymphs Abs: 1.6 10*3/uL (ref 0.7–4.0)
MCHC: 34.4 g/dL (ref 30.0–36.0)
MCV: 91.5 fl (ref 78.0–100.0)
Monocytes Absolute: 0.6 10*3/uL (ref 0.1–1.0)
Monocytes Relative: 9.7 % (ref 3.0–12.0)
Neutro Abs: 3.4 10*3/uL (ref 1.4–7.7)
Neutrophils Relative %: 57.7 % (ref 43.0–77.0)
Platelets: 129 10*3/uL — ABNORMAL LOW (ref 150.0–400.0)
RBC: 4.65 Mil/uL (ref 4.22–5.81)
RDW: 14.3 % (ref 11.5–15.5)
WBC: 6 10*3/uL (ref 4.0–10.5)

## 2020-08-26 LAB — COMPREHENSIVE METABOLIC PANEL
ALT: 26 U/L (ref 0–53)
AST: 20 U/L (ref 0–37)
Albumin: 4.3 g/dL (ref 3.5–5.2)
Alkaline Phosphatase: 69 U/L (ref 39–117)
BUN: 20 mg/dL (ref 6–23)
CO2: 30 mEq/L (ref 19–32)
Calcium: 9.7 mg/dL (ref 8.4–10.5)
Chloride: 104 mEq/L (ref 96–112)
Creatinine, Ser: 0.98 mg/dL (ref 0.40–1.50)
GFR: 78.56 mL/min (ref >60.00)
Glucose, Bld: 113 mg/dL — ABNORMAL HIGH (ref 70–99)
Potassium: 4 mEq/L (ref 3.5–5.1)
Sodium: 142 mEq/L (ref 135–145)
Total Bilirubin: 1.1 mg/dL (ref 0.2–1.2)
Total Protein: 7.1 g/dL (ref 6.0–8.3)

## 2020-08-26 LAB — TSH: TSH: 2.82 u[IU]/mL (ref 0.35–4.50)

## 2020-08-26 LAB — IBC PANEL
Iron: 126 ug/dL (ref 42–165)
Saturation Ratios: 32.4 % (ref 20.0–50.0)
Transferrin: 278 mg/dL (ref 212.0–360.0)

## 2020-08-26 LAB — FERRITIN: Ferritin: 180.6 ng/mL (ref 22.0–322.0)

## 2020-08-26 LAB — CK: Total CK: 46 U/L (ref 7–232)

## 2020-08-26 MED ORDER — HYDRALAZINE HCL 100 MG PO TABS: ORAL_TABLET | ORAL | 0 refills | Status: DC

## 2020-08-26 MED ORDER — HYDROCODONE-ACETAMINOPHEN 10-325 MG PO TABS
1.0000 | ORAL_TABLET | Freq: Four times a day (QID) | ORAL | 0 refills | Status: DC | PRN
Start: 2020-08-26 — End: 2020-09-30

## 2020-08-26 NOTE — Telephone Encounter (Signed)
ERx 

## 2020-08-26 NOTE — Assessment & Plan Note (Addendum)
Marked weight gain noted over the past year - this could contribute to worsening joint pains.

## 2020-08-26 NOTE — Assessment & Plan Note (Signed)
Marked diffuse body aches, predominantly on left side (chronic side of leg and arm pain ?RSD).  Acute worsening. In statin use, check CPK r/o statin myopathy.  Update other labs including Cr and electrolytes.

## 2020-08-26 NOTE — Telephone Encounter (Signed)
Patient has been unable to refill hydralazine -- per Dr. Rockey Situ he needed to be seen prior to refills. [name], would you be willing to prescribe for him? He uses [facility].   Thanks,  [name], PharmD Clinical Pharmacist [place] [facility] at [facility] [phone]

## 2020-08-26 NOTE — Telephone Encounter (Signed)
I have refilled 30 d supply for patient. He has cards appt 09/10/2020.

## 2020-08-26 NOTE — Telephone Encounter (Signed)
Rx request sent to pharmacy.  

## 2020-08-26 NOTE — Patient Instructions (Addendum)
Your blood pressure is remaining too high - set alarm in middle of the day to remember hydralazine dose.  Labs today.  Chest xray today.
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
## 2020-08-26 NOTE — Assessment & Plan Note (Addendum)
[place] CSRS reviewed. Hydrocodone refilled.  

## 2020-08-26 NOTE — Assessment & Plan Note (Addendum)
Multiple drug intolerances.  BP remaining too high.  Frequently misses mid-day hydralazine dose - discussed setting phone alarm to try and remember to take daily.

## 2020-08-26 NOTE — Assessment & Plan Note (Signed)
Describes burning pain ?neuropathy worse on left side of body - check labs.

## 2020-08-26 NOTE — Assessment & Plan Note (Signed)
H/o this although current symptoms do not sound consistent with RLS. Regardless, check TSH and iron panel for further evaluation.
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
## 2020-08-26 NOTE — Assessment & Plan Note (Addendum)
Hard to pinpoint given multiple descriptions of current pain - burning pain, muscle aches, restless skin sensation, and chronic arthritis pain.  He is already on gabapentin and hydrocodone. Previously on meloxicam and/or prednisone - consider retrial. a Intolerant to TCA.  He asks about neurology referral given progressive neuropathy symptoms - discussed may start with PM&R eval pending labwork.

## 2020-08-26 NOTE — Assessment & Plan Note (Addendum)
Stable period on probenecid and colchicine daily given allopurinol allergy.  Previously prescribed prednisone and meloxicam to use PRN - not recently.

## 2020-08-26 NOTE — Assessment & Plan Note (Addendum)
Ongoing cough for 4 weeks that presented with what likely was COVID-19 infection, however pt did not get tested and is since then improved. Anticipate post-infectious cough, no indication for abx at this time. He received J&J vaccine so serology would be unrevealing. Check CXR today, consider further treatment pending results.

## 2020-08-27 ENCOUNTER — Other Ambulatory Visit (INDEPENDENT_AMBULATORY_CARE_PROVIDER_SITE_OTHER): Payer: PPO

## 2020-08-27 DIAGNOSIS — R0600 Dyspnea, unspecified: Secondary | ICD-10-CM

## 2020-08-27 DIAGNOSIS — M791 Myalgia, unspecified site: Secondary | ICD-10-CM | POA: Diagnosis not present

## 2020-08-27 DIAGNOSIS — M109 Gout, unspecified: Secondary | ICD-10-CM

## 2020-08-27 LAB — HIGH SENSITIVITY CRP: CRP, High Sensitivity: 0.76 mg/L (ref 0.000–5.000)

## 2020-08-27 LAB — URIC ACID: Uric Acid, Serum: 4.8 mg/dL (ref 4.0–7.8)

## 2020-08-27 LAB — SEDIMENTATION RATE: Sed Rate: 16 mm/hr (ref 0–20)

## 2020-08-30 ENCOUNTER — Encounter: Payer: Self-pay | Admitting: Family Medicine

## 2020-08-30 DIAGNOSIS — M791 Myalgia, unspecified site: Secondary | ICD-10-CM

## 2020-08-30 DIAGNOSIS — G894 Chronic pain syndrome: Secondary | ICD-10-CM

## 2020-08-30 DIAGNOSIS — G90522 Complex regional pain syndrome I of left lower limb: Secondary | ICD-10-CM

## 2020-09-01 ENCOUNTER — Other Ambulatory Visit: Payer: Self-pay | Admitting: Family Medicine

## 2020-09-03 NOTE — Telephone Encounter (Signed)
Pharmacy requests refill on: Hydrocodone-Acetaminophen 10-325 mg   LAST REFILL: 08/26/2020 (Q-90, R-0) LAST OV: 08/26/2020 NEXT OV: 10/28/2020 PHARMACY: [facility]  UDS & Contract: 03/10/2019 Earliest Fill Date: 09/17/2020

## 2020-09-09 NOTE — Progress Notes (Signed)
Date:  09/10/2020   ID:  Randy Kennedy, DOB 04-26-1951, MRN [id number]  Patient Location:  [address] [place] [address]   Provider location:   [facility] [place] office  PCP:  [name], MD  Cardiologist:  Arvid Right [facility]  Chief Complaint  Patient presents with  . Other    12 month f/u C/o elevated BP and fatigue since having Covid back in Dec. Meds reviewed verbally with pt.    History of Present Illness:    Randy Kennedy is a 70 y.o. male  past medical history of RLS,  chronic burning in his feet/neuropathy, Chronic legs and arm numbness and tingling.  hypertension, medication intolerances hyperlipidemia,  OSA  ,not been using his bipap/cpap,  Episodes of atypical chest tightness.  Chronic SOB  Who presents for previous history of bradycardia,  Hypertension  chest pain  January 2020 diagnosed with bursitis in the arm On colchicine  Last visit 11/2018 Weight up,  We started lasix 2x a week, and cardura erports side effects to most meds  covid 06/2020 in to Jan 2022  Home BP 150s /86   takes losartan 100mg  daily and hydralazine 100mg  1.5 tabs TID.   SOB at rest and with exertion No chest pain, "little bit, not big"  Long history of bradycardia, still asymptomatic no regular exercise  Long discussion concerning medication intolerances Reviewed again Headaches, imdur,  Stopped cardura, 4 mg twice daily may have caused a rash Chlorthalidone causes gout Ca channel blocker: Amlodipine and Procardia leg swelling Clonidine /b-blocker: bradycardia spironolactone  Echo 06/11/2017 Normal EF, elevated RVSP  EKG personally reviewed by myself on todays visit NSR rate 57 bpm,. RBBB   Prior CV studies:   The following studies were reviewed today:  Echocardiogram November 2018 Left ventricle: The cavity size was normal. There was moderate   concentric hypertrophy. Systolic function was normal. The    estimated ejection fraction was in the range of 55% to 60%. Wall   motion was normal; there were no regional wall motion   abnormalities. Features are consistent with a pseudonormal left   ventricular filling pattern, with concomitant abnormal relaxation   and increased filling pressure (grade 2 diastolic dysfunction). - Aortic valve: There was trivial regurgitation. - Mitral valve: There was mild regurgitation. - Left atrium: The atrium was mildly dilated. - Right atrium: The atrium was moderately dilated. - Pulmonary arteries: Systolic pressure was moderately increased.   PA peak pressure: 48 mm Hg (S).     Past Medical History:  Diagnosis Date  . Allergic rhinitis   . Arthritis   . Chest pain    a. 11/2011 reportedly normal stress test performed in [place].  . Chronic pain    L ankle and bilateral hips (remote fracture s/p surgeries), lower back (told has herniated disc)  . Closed avulsion fracture of right talus 06/2016  . Eczema   . Erectile dysfunction 09/14/2012  . Gilbert disease   . Gout 2007  . Hearing loss    otosclerosis  . Heartburn    mild, controlled with pepcid  . History of chicken pox   . 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 HLD (hyperlipidemia)   . HTN (hypertension)    difficult to control - clonidine and beta blockers caused bradycardia  . Morbid obesity ([place])   . 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 OSA (obstructive sleep apnea) 11/2011  sleep study   a. moderate, AHI 37.4 increased to 80 in REM, on BiPAP 14/10, 97% compliance >4 hrs (06/2013); b. Does not tolerate CPAP.  Marland Kitchen RLS (restless legs syndrome)   . 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   Reviewed prior workup (saw pain management at [facility]): chronic back pain with RLS + RSD L leg with severe L post-traumatic ankle joint arthosis, no mention of peripheral neuropathy. Treated with vicodin, prior tried fentanyl and butrans.  . Systolic murmur    Past Surgical History:   Procedure Laterality Date  . COLONOSCOPY  02/2011   ext hem, benign polyp, rpt 5 yrs ([place])  . LEG SURGERY  x5   left - after fall at work (3.5 stories)  . MANDIBLE SURGERY  1965   jaw fracture - horse kick  . TYMPANIC MEMBRANE REPAIR Left 1994   otosclerosis     Current Meds  Medication Sig  . aspirin EC 81 MG tablet Take 81 mg by mouth daily.  . cetirizine (ZYRTEC) 10 MG tablet Take 10 mg by mouth at bedtime. PRN  . colchicine 0.6 MG tablet Take 1 tablet (0.6 mg total) by mouth daily.  Marland Kitchen gabapentin (NEURONTIN) 300 MG capsule Take 1 capsule (300 mg total) by mouth 2 (two) times daily AND 2 capsules (600 mg total) at bedtime.  . hydrALAZINE (APRESOLINE) 100 MG tablet TAKE 1 AND 1/2 TABLETS BY MOUTH THREE TIMES DAILY, MAY increase TO FOUR TIMES DAILY AS NEEDED FOR HIGH BLOOD PRESSURE  . HYDROcodone-acetaminophen (NORCO) 10-325 MG tablet Take 1 tablet by mouth every 6 (six) hours as needed.  . loratadine (CLARITIN) 10 MG tablet Take 1 tablet (10 mg total) by mouth daily.  Marland Kitchen losartan (COZAAR) 100 MG tablet Take 1 tablet (100 mg total) by mouth daily.  . meloxicam (MOBIC) 15 MG tablet TAKE 1 TABLET BY MOUTH ONCE DAILY AS NEEDED FOR  GOUT  FLARE  . nitroGLYCERIN (NITROSTAT) 0.4 MG SL tablet Place 1 tablet (0.4 mg total) under the tongue every 5 (five) minutes as needed for chest pain.  . pravastatin (PRAVACHOL) 40 MG tablet Take 1 tablet (40 mg total) by mouth daily.  . predniSONE (DELTASONE) 20 MG tablet Take 1 tablet (20 mg total) by mouth daily. For 1-2 days prn gout flare  . probenecid (BENEMID) 500 MG tablet Take 1/2 (one-half) tablet by mouth twice daily  . vitamin C (ASCORBIC ACID) 500 MG tablet Take 500 mg by mouth daily.     Allergies:   Allopurinol, Lisinopril-hydrochlorothiazide, Procardia [nifedipine], Spironolactone, Amlodipine, Doxazosin mesylate, Nortriptyline, and Uloric [febuxostat]   Social History   Tobacco Use  . Smoking status: Former Smoker    Packs/day: 1.00     Years: 4.00    Pack years: 4.00    Types: Pipe    Quit date: 07/27/1978    Years since quitting: 42.1  . Smokeless tobacco: Never Used  Vaping Use  . Vaping Use: Never used  Substance Use Topics  . Alcohol use: No    Alcohol/week: 0.0 standard drinks  . Drug use: No     Current Outpatient Medications on File Prior to Visit  Medication Sig Dispense Refill  . aspirin EC 81 MG tablet Take 81 mg by mouth daily.    . cetirizine (ZYRTEC) 10 MG tablet Take 10 mg by mouth at bedtime. PRN    . colchicine 0.6 MG tablet Take 1 tablet (0.6 mg total) by mouth daily. 90 tablet 3  . gabapentin (NEURONTIN) 300 MG  capsule Take 1 capsule (300 mg total) by mouth 2 (two) times daily AND 2 capsules (600 mg total) at bedtime. 360 capsule 3  . hydrALAZINE (APRESOLINE) 100 MG tablet TAKE 1 AND 1/2 TABLETS BY MOUTH THREE TIMES DAILY, MAY increase TO FOUR TIMES DAILY AS NEEDED FOR HIGH BLOOD PRESSURE 135 tablet 0  . HYDROcodone-acetaminophen (NORCO) 10-325 MG tablet Take 1 tablet by mouth every 6 (six) hours as needed. 90 tablet 0  . loratadine (CLARITIN) 10 MG tablet Take 1 tablet (10 mg total) by mouth daily.    Marland Kitchen losartan (COZAAR) 100 MG tablet Take 1 tablet (100 mg total) by mouth daily. 90 tablet 0  . meloxicam (MOBIC) 15 MG tablet TAKE 1 TABLET BY MOUTH ONCE DAILY AS NEEDED FOR  GOUT  FLARE 30 tablet 0  . nitroGLYCERIN (NITROSTAT) 0.4 MG SL tablet Place 1 tablet (0.4 mg total) under the tongue every 5 (five) minutes as needed for chest pain. 25 tablet 3  . pravastatin (PRAVACHOL) 40 MG tablet Take 1 tablet (40 mg total) by mouth daily. 90 tablet 3  . predniSONE (DELTASONE) 20 MG tablet Take 1 tablet (20 mg total) by mouth daily. For 1-2 days prn gout flare 20 tablet 0  . probenecid (BENEMID) 500 MG tablet Take 1/2 (one-half) tablet by mouth twice daily 90 tablet 3  . vitamin C (ASCORBIC ACID) 500 MG tablet Take 500 mg by mouth daily.     No current facility-administered medications on file prior to visit.      Family Hx: The patient's family history includes Cancer in his mother and paternal uncle; Cancer (age of onset: 35) in his father; Hypertension in his mother. There is no history of Diabetes, Stroke, or CAD.  ROS:   Please see the history of present illness.    Review of Systems  Constitutional: Negative.   Respiratory: Negative.   Cardiovascular: Negative.   Gastrointestinal: Negative.   Musculoskeletal: Negative.   Neurological: Negative.        Numb and tingling  Psychiatric/Behavioral: Negative.   All other systems reviewed and are negative.    Labs/Other Tests and Data Reviewed:    Recent Labs: 08/26/2020: ALT 26; BUN 20; Creatinine, Ser 0.98; Hemoglobin 14.6; Platelets 129.0; Potassium 4.0; Sodium 142; TSH 2.82   Recent Lipid Panel Lab Results  Component Value Date/Time   CHOL 176 08/22/2019 08:01 AM   CHOL 130 02/09/2012 12:00 AM   TRIG 92.0 08/22/2019 08:01 AM   TRIG 142 02/09/2012 12:00 AM   HDL 87.10 08/22/2019 08:01 AM   CHOLHDL 2 08/22/2019 08:01 AM   LDLCALC 71 08/22/2019 08:01 AM   LDLCALC 47 02/09/2012 12:00 AM   LDLDIRECT 90.0 07/01/2018 09:35 AM    Wt Readings from Last 3 Encounters:  09/10/20 227 lb (103 kg)  08/26/20 225 lb 8 oz (102.3 kg)  09/04/19 202 lb 4 oz (91.7 kg)     Exam:    BP (!) 178/90 (BP Location: Left Arm, Patient Position: Sitting, Cuff Size: Large)   Pulse (!) 57   Ht 5\' 7"  (1.702 m)   Wt 227 lb (103 kg)   SpO2 98%   BMI 35.55 kg/m  Constitutional:  oriented to person, place, and time. No distress.  HENT:  Head: Grossly normal Eyes:  no discharge. No scleral icterus.  Neck: No JVD, no carotid bruits  Cardiovascular: Regular rate and rhythm, no murmurs appreciated Pulmonary/Chest: Clear to auscultation bilaterally, no wheezes or rails Abdominal: Soft.  no distension.  no  tenderness.  Musculoskeletal: Normal range of motion Neurological:  normal muscle tone. Coordination normal. No atrophy Skin: Skin warm and  dry Psychiatric: normal affect, pleasant   ASSESSMENT & PLAN:    Morbid obesity ([place]) We have encouraged continued exercise, careful diet management in an effort to lose weight.  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  Chronic fatigue Not wearing his CPAP at night no regular exercise  RLS (restless legs syndrome) Chronic stable sx  SOB (shortness of breath) Likely from deconditioning, obesity,  Lasix as above    Total encounter time more than 25 minutes  Greater than 50% was spent in counseling and coordination of care with the patient    Signed, Ida Rogue, MD  09/10/2020 10:24 AM    [facility] [facility] [address], [place], [place] [address]

## 2020-09-10 ENCOUNTER — Encounter: Payer: Self-pay | Admitting: Cardiovascular Disease

## 2020-09-10 ENCOUNTER — Ambulatory Visit (INDEPENDENT_AMBULATORY_CARE_PROVIDER_SITE_OTHER): Payer: PPO | Admitting: Cardiovascular Disease

## 2020-09-10 ENCOUNTER — Other Ambulatory Visit: Payer: Self-pay

## 2020-09-10 ENCOUNTER — Telehealth: Payer: Self-pay

## 2020-09-10 VITALS — BP 178/90 | HR 57 | Ht 67.0 in | Wt 227.0 lb

## 2020-09-10 DIAGNOSIS — G4733 Obstructive sleep apnea (adult) (pediatric): Secondary | ICD-10-CM

## 2020-09-10 DIAGNOSIS — I10 Essential (primary) hypertension: Secondary | ICD-10-CM

## 2020-09-10 DIAGNOSIS — R0602 Shortness of breath: Secondary | ICD-10-CM | POA: Diagnosis not present

## 2020-09-10 DIAGNOSIS — G2581 Restless legs syndrome: Secondary | ICD-10-CM | POA: Diagnosis not present

## 2020-09-10 MED ORDER — FUROSEMIDE 20 MG PO TABS: 20.0000 mg | ORAL_TABLET | ORAL | 3 refills | Status: DC

## 2020-09-10 NOTE — Chronic Care Management (AMB) (Addendum)
Chronic Care Management Pharmacy Assistant   Name: Randy Kennedy  MRN: [id number] DOB: 1950/08/10  Reason for Encounter: Disease State- Hypertension    1.  Has patient seen any other providers or had any medication changes since last visit with [name], Pharm. D? Yes- as documented in chart. 09/10/20- Dr. Ida Rogue- Cardiology- Started patient on furosemide 20 mg 3 times a week. 08/26/20- Dr. [name]- PCP  PCP : [name], MD  Allergies:   Allergies  Allergen Reactions   Allopurinol Other (See Comments)    GI upset   Lisinopril-Hydrochlorothiazide     Burning in feet.   Procardia [Nifedipine] Other (See Comments)    Bradycardia, leg swelling   Spironolactone Other (See Comments)    malaise, dizziness   Amlodipine Rash    BLE   Doxazosin Mesylate Rash   Nortriptyline Rash   Uloric [Febuxostat] Rash    Medications: Outpatient Encounter Medications as of 09/10/2020  Medication Sig   aspirin EC 81 MG tablet Take 81 mg by mouth daily.   cetirizine (ZYRTEC) 10 MG tablet Take 10 mg by mouth at bedtime. PRN   colchicine 0.6 MG tablet Take 1 tablet (0.6 mg total) by mouth daily.   [START ON 09/11/2020] furosemide (LASIX) 20 MG tablet Take 1 tablet (20 mg total) by mouth 3 (three) times a week.   gabapentin (NEURONTIN) 300 MG capsule Take 1 capsule (300 mg total) by mouth 2 (two) times daily AND 2 capsules (600 mg total) at bedtime.   hydrALAZINE (APRESOLINE) 100 MG tablet TAKE 1 AND 1/2 TABLETS BY MOUTH THREE TIMES DAILY, MAY increase TO FOUR TIMES DAILY AS NEEDED FOR HIGH BLOOD PRESSURE   HYDROcodone-acetaminophen (NORCO) 10-325 MG tablet Take 1 tablet by mouth every 6 (six) hours as needed.   loratadine (CLARITIN) 10 MG tablet Take 1 tablet (10 mg total) by mouth daily.   losartan (COZAAR) 100 MG tablet Take 1 tablet (100 mg total) by mouth daily.   meloxicam (MOBIC) 15 MG tablet TAKE 1 TABLET BY MOUTH ONCE DAILY AS NEEDED FOR  GOUT  FLARE    nitroGLYCERIN (NITROSTAT) 0.4 MG SL tablet Place 1 tablet (0.4 mg total) under the tongue every 5 (five) minutes as needed for chest pain.   pravastatin (PRAVACHOL) 40 MG tablet Take 1 tablet (40 mg total) by mouth daily.   predniSONE (DELTASONE) 20 MG tablet Take 1 tablet (20 mg total) by mouth daily. For 1-2 days prn gout flare   probenecid (BENEMID) 500 MG tablet Take 1/2 (one-half) tablet by mouth twice daily   vitamin C (ASCORBIC ACID) 500 MG tablet Take 500 mg by mouth daily.   No facility-administered encounter medications on file as of 09/10/2020.    Current Diagnosis: Patient Active Problem List   Diagnosis Date Noted   Myalgia 08/26/2020   Burning sensation of skin 08/26/2020   Chronic pain disorder 08/26/2020   Pain and swelling of elbow, left 08/04/2018   Shortness of breath 07/05/2018   Allergic rhinitis 04/20/2018   Nasal congestion 05/19/2017   Severe obesity (BMI 35.0-39.9) with comorbidity ([place]) 01/07/2017   Encounter for chronic pain management 10/11/2016   Dizziness 03/24/2016   Chronic fatigue 03/20/2016   Narcolepsy    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   Bunion 10/22/2015   Cough 10/22/2015   Medicare annual wellness visit, subsequent 08/14/2015   Health maintenance examination 08/14/2015   Advanced care planning/counseling discussion 08/14/2015   Thrombocytopenia ([place]) 08/05/2015   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   History  of ankle fracture 07/19/2013   Drooping eyelid 03/09/2013   Leg edema 02/16/2013   Bradycardia 02/16/2013   Erectile dysfunction 09/14/2012   Gout 09/13/2012   GERD (gastroesophageal reflux disease)    HTN (hypertension)    HLD (hyperlipidemia)    OSA (obstructive sleep apnea)    RLS (restless legs syndrome)    [name] disease    Narcotic dependence ([place])    Systolic murmur      Reviewed chart prior to disease state call. Spoke with patient regarding BP  Recent Office Vitals: BP Readings from Last 3 Encounters:  09/10/20 (!) 178/90  08/26/20  (!) 170/86  09/04/19 (!) 144/86   Pulse Readings from Last 3 Encounters:  09/10/20 (!) 57  08/26/20 63  09/04/19 (!) 53    Wt Readings from Last 3 Encounters:  09/10/20 227 lb (103 kg)  08/26/20 225 lb 8 oz (102.3 kg)  09/04/19 202 lb 4 oz (91.7 kg)     Kidney Function Lab Results  Component Value Date/Time   CREATININE 0.98 08/26/2020 09:51 AM   CREATININE 1.08 08/22/2019 08:01 AM   CREATININE 1.22 03/25/2014 09:27 AM   CREATININE 1.06 02/09/2012 12:00 AM   GFR 78.56 08/26/2020 09:51 AM   GFRNONAA >60 10/04/2017 08:05 AM   GFRNONAA >60 03/25/2014 09:27 AM   GFRAA >60 10/04/2017 08:05 AM   GFRAA >60 03/25/2014 09:27 AM    BMP Latest Ref Rng & Units 08/26/2020 08/22/2019 03/10/2019  Glucose 70 - 99 mg/dL 113(H) 97 128(H)  BUN 6 - 23 mg/dL 20 18 21   Creatinine 0.40 - 1.50 mg/dL 0.98 1.08 1.16  Sodium 135 - 145 mEq/L 142 141 139  Potassium 3.5 - 5.1 mEq/L 4.0 4.0 3.9  Chloride 96 - 112 mEq/L 104 102 101  CO2 19 - 32 mEq/L 30 30 30   Calcium 8.4 - 10.5 mg/dL 9.7 9.3 9.1   Multiple attempts made to reach patient for hypertension disease state call. Unsuccessful outreach. Will follow up next month.  Current antihypertensive regimen:  Lasix 20 mg- 1 tablet Mon, Weds, Friday Losartan 100 mg- 1 tablet daily Hydralazine 100 mg- 1 and 1/2 tablets TID  What recent interventions/DTPs have been made by any provider to improve Blood Pressure control since last CPP Visit:  Dr. Rockey Situ started patient on furosemide 20 mg three days a week.   Any recent hospitalizations or ED visits since last visit with CPP? No   Adherence Review: Is the patient currently on ACE/ARB medication? Yes Does the patient have >5 day gap between last estimated fill dates? No - refills timely   Follow-Up:  Pharmacist Review  [name], CPP notified  Margaretmary Dys, [place] Assistant [phone]  I have reviewed the care management and care coordination activities outlined in  this encounter and I am certifying that I agree with the content of this note. No further action required.  [name], PharmD Clinical Pharmacist [facility] [phone]

## 2020-09-10 NOTE — Patient Instructions (Addendum)
Medication Instructions:  Lasix 20 mg ( shortness of breath and high blood pressure)  3 days a week   Mon/wed/friday  Lab work: No new labs needed  Testing/Procedures: No new testing needed   Follow-Up:  . You will need a follow up appointment in 6 months  . Providers on your designated Care Team:   . Murray Hodgkins, NP . Christell Faith, PA-C . Marrianne Mood, PA-C

## 2020-09-20 ENCOUNTER — Other Ambulatory Visit: Payer: Self-pay | Admitting: Physical Medicine & Rehabilitation

## 2020-09-20 DIAGNOSIS — M5416 Radiculopathy, lumbar region: Secondary | ICD-10-CM

## 2020-09-20 DIAGNOSIS — M5442 Lumbago with sciatica, left side: Secondary | ICD-10-CM | POA: Diagnosis not present

## 2020-09-20 DIAGNOSIS — M5412 Radiculopathy, cervical region: Secondary | ICD-10-CM

## 2020-09-20 DIAGNOSIS — G8929 Other chronic pain: Secondary | ICD-10-CM | POA: Diagnosis not present

## 2020-09-30 ENCOUNTER — Other Ambulatory Visit: Payer: Self-pay | Admitting: Family Medicine

## 2020-10-01 ENCOUNTER — Ambulatory Visit (INDEPENDENT_AMBULATORY_CARE_PROVIDER_SITE_OTHER): Payer: PPO

## 2020-10-01 ENCOUNTER — Other Ambulatory Visit: Payer: Self-pay

## 2020-10-01 DIAGNOSIS — Z0000 Encounter for general adult medical examination without abnormal findings: Secondary | ICD-10-CM

## 2020-10-01 MED ORDER — HYDROCODONE-ACETAMINOPHEN 10-325 MG PO TABS: 1.0000 | ORAL_TABLET | Freq: Four times a day (QID) | ORAL | 0 refills | Status: DC | PRN

## 2020-10-01 NOTE — Telephone Encounter (Signed)
ERx 

## 2020-10-01 NOTE — Progress Notes (Signed)
PCP notes:  Health Maintenance: Flu- declined Covid booster- declined   Abnormal Screenings: none   Patient concerns: none   Nurse concerns: none   Next PCP appt.: 10/28/2020 @ 9 am

## 2020-10-01 NOTE — Progress Notes (Signed)
Subjective:   Randy Kennedy is a 70 y.o. male who presents for Medicare Annual/Subsequent preventive examination.  Review of Systems: N/A      I connected with the patient today by telephone and verified that I am speaking with the correct person using two identifiers. Location patient: home Location nurse: work Persons participating in the telephone visit: patient, nurse.   I discussed the limitations, risks, security and privacy concerns of performing an evaluation and management service by telephone and the availability of in person appointments. I also discussed with the patient that there may be a patient responsible charge related to this service. The patient expressed understanding and verbally consented to this telephonic visit.        Cardiac Risk Factors include: advanced age (>26men, >70 women);male gender;hypertension     Objective:    Today's Vitals   10/01/20 1303  PainSc: 9    There is no height or weight on file to calculate BMI.  Advanced Directives 10/01/2020 08/23/2019 07/01/2018 10/04/2017 08/24/2017  Does Patient Have a Medical Advance Directive? No No Yes Yes No  Type of Advance Directive - Public librarian;Living will Living will -  Copy of [place] in Chart? - - No - copy requested - -  Would patient like information on creating a medical advance directive? Yes (MAU/Ambulatory/Procedural Areas - Information given) Yes (MAU/Ambulatory/Procedural Areas - Information given) - - No - Patient declined    Current Medications (verified) Outpatient Encounter Medications as of 10/01/2020  Medication Sig  . aspirin EC 81 MG tablet Take 81 mg by mouth daily.  . cetirizine (ZYRTEC) 10 MG tablet Take 10 mg by mouth at bedtime. PRN  . colchicine 0.6 MG tablet Take 1 tablet (0.6 mg total) by mouth daily.  Marland Kitchen gabapentin (NEURONTIN) 300 MG capsule Take 1 capsule (300 mg total) by mouth 2 (two) times daily AND 2 capsules (600 mg total) at  bedtime.  . hydrALAZINE (APRESOLINE) 100 MG tablet TAKE 1 AND 1/2 TABLETS BY MOUTH THREE TIMES DAILY, MAY increase TO FOUR TIMES DAILY AS NEEDED FOR HIGH BLOOD PRESSURE  . HYDROcodone-acetaminophen (NORCO) 10-325 MG tablet Take 1 tablet by mouth every 6 (six) hours as needed.  . loratadine (CLARITIN) 10 MG tablet Take 1 tablet (10 mg total) by mouth daily.  Marland Kitchen losartan (COZAAR) 100 MG tablet Take 1 tablet (100 mg total) by mouth daily.  . meloxicam (MOBIC) 15 MG tablet TAKE 1 TABLET BY MOUTH ONCE DAILY AS NEEDED FOR  GOUT  FLARE  . nitroGLYCERIN (NITROSTAT) 0.4 MG SL tablet Place 1 tablet (0.4 mg total) under the tongue every 5 (five) minutes as needed for chest pain.  . pravastatin (PRAVACHOL) 40 MG tablet Take 1 tablet (40 mg total) by mouth daily.  . predniSONE (DELTASONE) 20 MG tablet Take 1 tablet (20 mg total) by mouth daily. For 1-2 days prn gout flare  . probenecid (BENEMID) 500 MG tablet Take 1/2 (one-half) tablet by mouth twice daily  . vitamin C (ASCORBIC ACID) 500 MG tablet Take 500 mg by mouth daily.  . furosemide (LASIX) 20 MG tablet Take 1 tablet (20 mg total) by mouth 3 (three) times a week. (Patient not taking: Reported on 10/01/2020)   No facility-administered encounter medications on file as of 10/01/2020.    Allergies (verified) Allopurinol, Lisinopril-hydrochlorothiazide, Procardia [nifedipine], Spironolactone, Amlodipine, Doxazosin mesylate, Nortriptyline, and Uloric [febuxostat]   History: Past Medical History:  Diagnosis Date  . Allergic rhinitis   .  Arthritis   . Chest pain    a. 11/2011 reportedly normal stress test performed in [place].  . Chronic pain    L ankle and bilateral hips (remote fracture s/p surgeries), lower back (told has herniated disc)  . Closed avulsion fracture of right talus 06/2016  . Eczema   . Erectile dysfunction 09/14/2012  . Gilbert disease   . Gout 2007  . Hearing loss    otosclerosis  . Heartburn    mild, controlled with pepcid  .  History of chicken pox   . 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 HLD (hyperlipidemia)   . HTN (hypertension)    difficult to control - clonidine and beta blockers caused bradycardia  . Morbid obesity ([place])   . 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 OSA (obstructive sleep apnea) 11/2011 sleep study   a. moderate, AHI 37.4 increased to 80 in REM, on BiPAP 14/10, 97% compliance >4 hrs (06/2013); b. Does not tolerate CPAP.  Marland Kitchen RLS (restless legs syndrome)   . 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   Reviewed prior workup (saw pain management at [place]): chronic back pain with RLS + RSD L leg with severe L post-traumatic ankle joint arthosis, no mention of peripheral neuropathy. Treated with vicodin, prior tried fentanyl and butrans.  . Systolic murmur    Past Surgical History:  Procedure Laterality Date  . COLONOSCOPY  02/2011   ext hem, benign polyp, rpt 5 yrs ([place])  . LEG SURGERY  x5   left - after fall at work (3.5 stories)  . MANDIBLE SURGERY  1965   jaw fracture - horse kick  . TYMPANIC MEMBRANE REPAIR Left 1994   otosclerosis   Family History  Problem Relation Age of Onset  . Cancer Father 110       prostate  . Cancer Mother        rectal  . Hypertension Mother   . Cancer Paternal Uncle        prostate  . Diabetes Neg Hx   . Stroke Neg Hx   . CAD Neg Hx    Social History   Socioeconomic History  . Marital status: Married    Spouse name: Not on file  . Number of children: Not on file  . Years of education: Not on file  . Highest education level: Not on file  Occupational History  . Not on file  Tobacco Use  . Smoking status: Former Smoker    Packs/day: 1.00    Years: 4.00    Pack years: 4.00    Types: Pipe    Quit date: 07/27/1978    Years since quitting: 42.2  . Smokeless tobacco: Never Used  Vaping Use  . Vaping Use: Never used  Substance and Sexual Activity  . Alcohol use: No    Alcohol/week: 0.0 standard drinks  . Drug use: No   . Sexual activity: Not on file  Other Topics Concern  . Not on file  Social History Narrative   Caffeine: 2 cans coke/day   Lives with wife and grown son    Occupation: retired, was Agricultural engineer.   On disability for chronic pain   Edu: MBA   Activity: no regular exercise   Deit: good water, fruits/vegetables daily   Social Determinants of Health   Financial Resource Strain: Low Risk   . Difficulty of Paying Living Expenses: Not hard at all  Food Insecurity: No  Food Insecurity  . Worried About Charity fundraiser in the Last Year: Never true  . Ran Out of Food in the Last Year: Never true  Transportation Needs: No Transportation Needs  . Lack of Transportation (Medical): No  . Lack of Transportation (Non-Medical): No  Physical Activity: Inactive  . Days of Exercise per Week: 0 days  . Minutes of Exercise per Session: 0 min  Stress: No Stress Concern Present  . Feeling of Stress : Not at all  Social Connections: Not on file    Tobacco Counseling Counseling given: Not Answered   Clinical Intake:  Pre-visit preparation completed: Yes  Pain : 0-10 Pain Score: 9  Pain Type: Chronic pain Pain Location: Back Pain Descriptors / Indicators: Aching Pain Onset: More than a month ago     Nutritional Risks: None Diabetes: No  How often do you need to have someone help you when you read instructions, pamphlets, or other written materials from your doctor or pharmacy?: 1 - Never  Diabetic: No Nutrition Risk Assessment:  Has the patient had any N/V/D within the last 2 months?  No  Does the patient have any non-healing wounds?  No  Has the patient had any unintentional weight loss or weight gain?  No   Diabetes:  Is the patient diabetic?  No  If diabetic, was a CBG obtained today?  N/A Did the patient bring in their glucometer from home?  N/A How often do you monitor your CBG's? N/A.   Financial Strains and Diabetes Management:  Are you having any  financial strains with the device, your supplies or your medication? N/A.  Does the patient want to be seen by Chronic Care Management for management of their diabetes?  N/A Would the patient like to be referred to a Nutritionist or for Diabetic Management?  N/A   Interpreter Needed?: No  Information entered by :: CJohnson, LPN   Activities of Daily Living In your present state of health, do you have any difficulty performing the following activities: 10/01/2020  Hearing? Y  Comment hearing loss in both ears  Vision? Y  Difficulty concentrating or making decisions? N  Walking or climbing stairs? N  Dressing or bathing? N  Doing errands, shopping? N  Preparing Food and eating ? N  Using the Toilet? N  In the past six months, have you accidently leaked urine? N  Do you have problems with loss of bowel control? N  Managing your Medications? N  Managing your Finances? N  Housekeeping or managing your Housekeeping? N  Some recent data might be hidden    Patient Care Team: [name], MD as PCP - General (Family Medicine) [name], [facility] as Pharmacist (Pharmacist)  Indicate any recent Medical Services you may have received from other than [facility] providers in the past year (date may be approximate).     Assessment:   This is a routine wellness examination for Randy Kennedy.  Hearing/Vision screen  Hearing Screening   125Hz  250Hz  500Hz  1000Hz  2000Hz  3000Hz  4000Hz  6000Hz  8000Hz   Right ear:           Left ear:           Vision Screening Comments: Patient gets annual eye exams   Dietary issues and exercise activities discussed: Current Exercise Habits: The patient does not participate in regular exercise at present, Exercise limited by: None identified  Goals    . Patient Stated     10/01/2020, I will maintain and continue medications as prescribed.    Marland Kitchen  Pharmacy Care Plan     Current Barriers:  . Chronic Disease Management support, education, and care coordination needs  related to hypertension, allergic rhinitis, hyperlipidemia, gout, chronic pain  Pharmacist Clinical Goal(s):  Marland Kitchen Pharmacy review of medication safety every 6 months . Continue taking medications as prescribed . Continue to achieve pain control  . Limit cumulative anti-inflammatory use (Advil, naproxen, meloxicam) due to interaction with aspirin, increased risk of bleeding, and reduced kidney function   Interventions: . Comprehensive medication review performed. [facility] pharmacy delivery services for improved adherence  Patient Self Care Activities:  . Fills pillbox weekly . Checks blood pressure every other day . Takes medications as prescribed . Focusing on healthy diet and lifestyle  Initial goal documentation      Depression Screen PHQ 2/9 Scores 10/01/2020 08/23/2019 07/01/2018 01/07/2017 08/14/2015  PHQ - 2 Score 0 0 0 0 0  PHQ- 9 Score 0 0 0 - -    Fall Risk Fall Risk  10/01/2020 08/23/2019 07/01/2018 01/07/2017 08/14/2015  Falls in the past year? 0 0 0 Yes No  Number falls in past yr: 0 0 - 1 -  Injury with Fall? 0 0 - Yes -  Risk for fall due to : Medication side effect Medication side effect;Impaired balance/gait;Impaired mobility - - -  Follow up Falls evaluation completed;Falls prevention discussed Falls evaluation completed;Falls prevention discussed - - -    FALL RISK PREVENTION PERTAINING TO THE HOME:  Any stairs in or around the home? Yes  If so, are there any without handrails? No  Home free of loose throw rugs in walkways, pet beds, electrical cords, etc? Yes  Adequate lighting in your home to reduce risk of falls? Yes   ASSISTIVE DEVICES UTILIZED TO PREVENT FALLS:  Life alert? No  Use of a cane, walker or w/c? Yes  Grab bars in the bathroom? No  Shower chair or bench in shower? Yes  Elevated toilet seat or a handicapped toilet? No   TIMED UP AND GO:  Was the test performed? N/A telephone visit .    Cognitive Function: MMSE - Mini Mental State Exam  10/01/2020 08/23/2019 07/01/2018  Not completed: Refused - -  Orientation to time - 5 5  Orientation to Place - 5 5  Registration - 3 3  Attention/ Calculation - 5 0  Recall - 3 1  Recall-comments - - unable to recall 2 of 3 words  Language- name 2 objects - - 0  Language- repeat - 1 1  Language- follow 3 step command - - 3  Language- read & follow direction - - 0  Write a sentence - - 0  Copy design - - 0  Total score - - 18  Mini Cog  Mini-Cog screen was not completed. Patient refused. Maximum score is 22. A value of 0 denotes this part of the MMSE was not completed or the patient failed this part of the Mini-Cog screening.       Immunizations Immunization History  Administered Date(s) Administered  . Fluad Quad(high Dose 65+) 04/06/2019  . Influenza, Seasonal, Injecte, Preservative Fre 08/02/2012, 04/26/2013  . Influenza,inj,Quad PF,6+ Mos 04/30/2014, 08/14/2015, 03/24/2016, 05/19/2017, 04/20/2018  . Influenza-Unspecified 04/30/2014, 08/14/2015, 03/24/2016, 05/19/2017, 04/20/2018  . Janssen (J&J) SARS-COV-2 Vaccination 11/01/2019  . Pneumococcal Conjugate-13 01/07/2017  . Pneumococcal Polysaccharide-23 07/01/2018  . Td 07/27/2012  . Zoster 04/26/2013    TDAP status: Up to date  Flu Vaccine status: Declined, Education has been provided regarding the importance of this vaccine  but patient still declined. Advised may receive this vaccine at local pharmacy or Health Dept. Aware to provide a copy of the vaccination record if obtained from local pharmacy or Health Dept. Verbalized acceptance and understanding.  Pneumococcal vaccine status: Up to date  Covid-19 vaccine status: 1 dose of J&J completed 11/01/2019, declined booster   Qualifies for Shingles Vaccine? Yes   Zostavax completed Yes   Shingrix Completed?: No.    Education has been provided regarding the importance of this vaccine. Patient has been advised to call insurance company to determine out of pocket expense if they  have not yet received this vaccine. Advised may also receive vaccine at local pharmacy or Health Dept. Verbalized acceptance and understanding.  Screening Tests Health Maintenance  Topic Date Due  . INFLUENZA VACCINE  10/24/2020 (Originally 02/25/2020)  . COVID-19 Vaccine (2 - Booster for Janssen series) 11/17/2020 (Originally 12/27/2019)  . COLONOSCOPY (Pts 45-15yrs Insurance coverage will need to be confirmed)  03/12/2021  . TETANUS/TDAP  07/27/2022  . Hepatitis C Screening  Completed  . PNA vac Low Risk Adult  Completed  . HPV VACCINES  Aged Out    Health Maintenance  There are no preventive care reminders to display for this patient.  Colorectal cancer screening: Type of screening: Colonoscopy. Completed 03/13/2011. Repeat every 10 years  Lung Cancer Screening: (Low Dose CT Chest recommended if Age 67-80 years, 30 pack-year currently smoking OR have quit w/in 15years.) does not qualify.    Additional Screening:  Hepatitis C Screening: does qualify; Completed 01/04/2017  Vision Screening: Recommended annual ophthalmology exams for early detection of glaucoma and other disorders of the eye. Is the patient up to date with their annual eye exam?  Yes  Who is the provider or what is the name of the office in which the patient attends annual eye exams? [facility]  If pt is not established with a provider, would they like to be referred to a provider to establish care? No .   Dental Screening: Recommended annual dental exams for proper oral hygiene  Community Resource Referral / Chronic Care Management: CRR required this visit?  No   CCM required this visit?  No      Plan:     I have personally reviewed and noted the following in the patient's chart:   . Medical and social history . Use of alcohol, tobacco or illicit drugs  . Current medications and supplements . Functional ability and status . Nutritional status . Physical activity . Advanced directives . List of  other physicians . Hospitalizations, surgeries, and ER visits in previous 12 months . Vitals . Screenings to include cognitive, depression, and falls . Referrals and appointments  In addition, I have reviewed and discussed with patient certain preventive protocols, quality metrics, and best practice recommendations. A written personalized care plan for preventive services as well as general preventive health recommendations were provided to patient.   Due to this being a telephonic visit, the after visit summary with patients personalized plan was offered to patient via office or my-chart. Patient preferred to pick up at office at next visit or via mychart.   Andrez Grime, LPN   [phone]

## 2020-10-01 NOTE — Telephone Encounter (Signed)
Pharmacy requests refill on: Hydrocodone-Acetaminophen 10-325 mg   LAST REFILL: 08/26/2020 (Q-90, R-0) LAST OV: 08/26/2020 NEXT OV: 10/28/2020 PHARMACY: [facility]  UDS & Contract: 03/10/2019

## 2020-10-01 NOTE — Patient Instructions (Signed)
Randy Kennedy , Thank you for taking time to come for your Medicare Wellness Visit. I appreciate your ongoing commitment to your health goals. Please review the following plan we discussed and let me know if I can assist you in the future.   Screening recommendations/referrals: Colonoscopy: Up to date, completed 03/13/2011, due 02/2021 Recommended yearly ophthalmology/optometry visit for glaucoma screening and checkup Recommended yearly dental visit for hygiene and checkup  Vaccinations: Influenza vaccine: declined Pneumococcal vaccine: Completed series Tdap vaccine: Up to date, completed 07/27/2012, due 07/2022 Shingles vaccine: due, check with your insurance regarding coverage if interested    Covid-19:  1 dose of J&J completed 11/01/2019, declined booster   Advanced directives: Advance directive discussed with you today.You state you are in the process of having this completed. Once this is complete please bring a copy in to our office so we can scan it into your chart.   Conditions/risks identified: hypertension  Next appointment: Follow up in one year for your annual wellness visit.   Preventive Care 67 Years and Older, Male Preventive care refers to lifestyle choices and visits with your health care provider that can promote health and wellness. What does preventive care include?  A yearly physical exam. This is also called an annual well check.  Dental exams once or twice a year.  Routine eye exams. Ask your health care provider how often you should have your eyes checked.  Personal lifestyle choices, including:  Daily care of your teeth and gums.  Regular physical activity.  Eating a healthy diet.  Avoiding tobacco and drug use.  Limiting alcohol use.  Practicing safe sex.  Taking low doses of aspirin every day.  Taking vitamin and mineral supplements as recommended by your health care provider. What happens during an annual well check? The services and screenings  done by your health care provider during your annual well check will depend on your age, overall health, lifestyle risk factors, and family history of disease. Counseling  Your health care provider may ask you questions about your:  Alcohol use.  Tobacco use.  Drug use.  Emotional well-being.  Home and relationship well-being.  Sexual activity.  Eating habits.  History of falls.  Memory and ability to understand (cognition).  Work and work Statistician. Screening  You may have the following tests or measurements:  Height, weight, and BMI.  Blood pressure.  Lipid and cholesterol levels. These may be checked every 5 years, or more frequently if you are over 63 years old.  Skin check.  Lung cancer screening. You may have this screening every year starting at age 107 if you have a 30-pack-year history of smoking and currently smoke or have quit within the past 15 years.  Fecal occult blood test (FOBT) of the stool. You may have this test every year starting at age 64.  Flexible sigmoidoscopy or colonoscopy. You may have a sigmoidoscopy every 5 years or a colonoscopy every 10 years starting at age 34.  Prostate cancer screening. Recommendations will vary depending on your family history and other risks.  Hepatitis C blood test.  Hepatitis B blood test.  Sexually transmitted disease (STD) testing.  Diabetes screening. This is done by checking your blood sugar (glucose) after you have not eaten for a while (fasting). You may have this done every 1-3 years.  Abdominal aortic aneurysm (AAA) screening. You may need this if you are a current or former smoker.  Osteoporosis. You may be screened starting at age 3 if you are  at high risk. Talk with your health care provider about your test results, treatment options, and if necessary, the need for more tests. Vaccines  Your health care provider may recommend certain vaccines, such as:  Influenza vaccine. This is recommended  every year.  Tetanus, diphtheria, and acellular pertussis (Tdap, Td) vaccine. You may need a Td booster every 10 years.  Zoster vaccine. You may need this after age 47.  Pneumococcal 13-valent conjugate (PCV13) vaccine. One dose is recommended after age 75.  Pneumococcal polysaccharide (PPSV23) vaccine. One dose is recommended after age 27. Talk to your health care provider about which screenings and vaccines you need and how often you need them. This information is not intended to replace advice given to you by your health care provider. Make sure you discuss any questions you have with your health care provider. Document Released: 08/09/2015 Document Revised: 04/01/2016 Document Reviewed: 05/14/2015 Elsevier Interactive Patient Education  2017 [employer] Prevention in the Home Falls can cause injuries. They can happen to people of all ages. There are many things you can do to make your home safe and to help prevent falls. What can I do on the outside of my home?  Regularly fix the edges of walkways and driveways and fix any cracks.  Remove anything that might make you trip as you walk through a door, such as a raised step or threshold.  Trim any bushes or trees on the path to your home.  Use bright outdoor lighting.  Clear any walking paths of anything that might make someone trip, such as rocks or tools.  Regularly check to see if handrails are loose or broken. Make sure that both sides of any steps have handrails.  Any raised decks and porches should have guardrails on the edges.  Have any leaves, snow, or ice cleared regularly.  Use sand or salt on walking paths during winter.  Clean up any spills in your garage right away. This includes oil or grease spills. What can I do in the bathroom?  Use night lights.  Install grab bars by the toilet and in the tub and shower. Do not use towel bars as grab bars.  Use non-skid mats or decals in the tub or shower.  If  you need to sit down in the shower, use a plastic, non-slip stool.  Keep the floor dry. Clean up any water that spills on the floor as soon as it happens.  Remove soap buildup in the tub or shower regularly.  Attach bath mats securely with double-sided non-slip rug tape.  Do not have throw rugs and other things on the floor that can make you trip. What can I do in the bedroom?  Use night lights.  Make sure that you have a light by your bed that is easy to reach.  Do not use any sheets or blankets that are too big for your bed. They should not hang down onto the floor.  Have a firm chair that has side arms. You can use this for support while you get dressed.  Do not have throw rugs and other things on the floor that can make you trip. What can I do in the kitchen?  Clean up any spills right away.  Avoid walking on wet floors.  Keep items that you use a lot in easy-to-reach places.  If you need to reach something above you, use a strong step stool that has a grab bar.  Keep electrical cords out of  the way.  Do not use floor polish or wax that makes floors slippery. If you must use wax, use non-skid floor wax.  Do not have throw rugs and other things on the floor that can make you trip. What can I do with my stairs?  Do not leave any items on the stairs.  Make sure that there are handrails on both sides of the stairs and use them. Fix handrails that are broken or loose. Make sure that handrails are as long as the stairways.  Check any carpeting to make sure that it is firmly attached to the stairs. Fix any carpet that is loose or worn.  Avoid having throw rugs at the top or bottom of the stairs. If you do have throw rugs, attach them to the floor with carpet tape.  Make sure that you have a light switch at the top of the stairs and the bottom of the stairs. If you do not have them, ask someone to add them for you. What else can I do to help prevent falls?  Wear shoes  that:  Do not have high heels.  Have rubber bottoms.  Are comfortable and fit you well.  Are closed at the toe. Do not wear sandals.  If you use a stepladder:  Make sure that it is fully opened. Do not climb a closed stepladder.  Make sure that both sides of the stepladder are locked into place.  Ask someone to hold it for you, if possible.  Clearly mark and make sure that you can see:  Any grab bars or handrails.  First and last steps.  Where the edge of each step is.  Use tools that help you move around (mobility aids) if they are needed. These include:  Canes.  Walkers.  Scooters.  Crutches.  Turn on the lights when you go into a dark area. Replace any light bulbs as soon as they burn out.  Set up your furniture so you have a clear path. Avoid moving your furniture around.  If any of your floors are uneven, fix them.  If there are any pets around you, be aware of where they are.  Review your medicines with your doctor. Some medicines can make you feel dizzy. This can increase your chance of falling. Ask your doctor what other things that you can do to help prevent falls. This information is not intended to replace advice given to you by your health care provider. Make sure you discuss any questions you have with your health care provider. Document Released: 05/09/2009 Document Revised: 12/19/2015 Document Reviewed: 08/17/2014 Elsevier Interactive Patient Education  2017 [employer].

## 2020-10-02 ENCOUNTER — Telehealth: Payer: Self-pay | Admitting: Family Medicine

## 2020-10-02 DIAGNOSIS — Z8781 Personal history of (healed) traumatic fracture: Secondary | ICD-10-CM

## 2020-10-02 DIAGNOSIS — G90522 Complex regional pain syndrome I of left lower limb: Secondary | ICD-10-CM

## 2020-10-02 DIAGNOSIS — G894 Chronic pain syndrome: Secondary | ICD-10-CM

## 2020-10-02 DIAGNOSIS — F1120 Opioid dependence, uncomplicated: Secondary | ICD-10-CM

## 2020-10-02 NOTE — Addendum Note (Signed)
Addended by: [name] on: 10/02/2020 06:01 PM   Modules accepted: Orders

## 2020-10-02 NOTE — Telephone Encounter (Signed)
Referral placed.

## 2020-10-02 NOTE — Telephone Encounter (Signed)
Pt called and left a voicemail requesting a referral to pain management.   Please advise, thanks.

## 2020-10-03 NOTE — Telephone Encounter (Signed)
Lvm asking pt to call back.  Need to inform pt pain referral was placed and he will be contacted.

## 2020-10-04 NOTE — Telephone Encounter (Signed)
Spoke with patient and informed him that the pain referral was placed and that someone should be in contact with him soon to get this set-up. Patient verbalized understanding.

## 2020-10-07 ENCOUNTER — Ambulatory Visit
Admission: RE | Admit: 2020-10-07 | Discharge: 2020-10-07 | Disposition: A | Discharge status: Home / Self Care | Payer: PPO | Source: Ambulatory Visit | Attending: Physical Medicine & Rehabilitation | Admitting: Physical Medicine & Rehabilitation

## 2020-10-07 ENCOUNTER — Other Ambulatory Visit: Payer: Self-pay

## 2020-10-07 DIAGNOSIS — M5416 Radiculopathy, lumbar region: Secondary | ICD-10-CM | POA: Diagnosis not present

## 2020-10-07 DIAGNOSIS — M5412 Radiculopathy, cervical region: Secondary | ICD-10-CM | POA: Diagnosis not present

## 2020-10-07 DIAGNOSIS — Q7649 Other congenital malformations of spine, not associated with scoliosis: Secondary | ICD-10-CM | POA: Diagnosis not present

## 2020-10-07 DIAGNOSIS — M48061 Spinal stenosis, lumbar region without neurogenic claudication: Secondary | ICD-10-CM | POA: Diagnosis not present

## 2020-10-07 DIAGNOSIS — M5116 Intervertebral disc disorders with radiculopathy, lumbar region: Secondary | ICD-10-CM | POA: Diagnosis not present

## 2020-10-07 DIAGNOSIS — M4802 Spinal stenosis, cervical region: Secondary | ICD-10-CM | POA: Diagnosis not present

## 2020-10-09 DIAGNOSIS — M542 Cervicalgia: Secondary | ICD-10-CM | POA: Diagnosis not present

## 2020-10-09 DIAGNOSIS — G8929 Other chronic pain: Secondary | ICD-10-CM | POA: Diagnosis not present

## 2020-10-09 DIAGNOSIS — M5412 Radiculopathy, cervical region: Secondary | ICD-10-CM | POA: Diagnosis not present

## 2020-10-09 DIAGNOSIS — M5442 Lumbago with sciatica, left side: Secondary | ICD-10-CM | POA: Diagnosis not present

## 2020-10-09 DIAGNOSIS — M4802 Spinal stenosis, cervical region: Secondary | ICD-10-CM | POA: Diagnosis not present

## 2020-10-09 DIAGNOSIS — M48062 Spinal stenosis, lumbar region with neurogenic claudication: Secondary | ICD-10-CM | POA: Diagnosis not present

## 2020-10-10 DIAGNOSIS — M48062 Spinal stenosis, lumbar region with neurogenic claudication: Secondary | ICD-10-CM | POA: Diagnosis not present

## 2020-10-10 DIAGNOSIS — G8929 Other chronic pain: Secondary | ICD-10-CM | POA: Diagnosis not present

## 2020-10-10 DIAGNOSIS — M5442 Lumbago with sciatica, left side: Secondary | ICD-10-CM | POA: Diagnosis not present

## 2020-10-15 ENCOUNTER — Other Ambulatory Visit: Payer: Self-pay | Admitting: *Deleted

## 2020-10-15 NOTE — Telephone Encounter (Signed)
Received a faxed refill request from pharmacy for Colchicine

## 2020-10-17 MED ORDER — COLCHICINE 0.6 MG PO TABS: 0.6000 mg | ORAL_TABLET | Freq: Every day | ORAL | 1 refills | Status: DC

## 2020-10-20 ENCOUNTER — Other Ambulatory Visit: Payer: Self-pay | Admitting: Family Medicine

## 2020-10-20 DIAGNOSIS — Z125 Encounter for screening for malignant neoplasm of prostate: Secondary | ICD-10-CM

## 2020-10-20 DIAGNOSIS — R739 Hyperglycemia, unspecified: Secondary | ICD-10-CM

## 2020-10-20 DIAGNOSIS — E782 Mixed hyperlipidemia: Secondary | ICD-10-CM

## 2020-10-21 ENCOUNTER — Telehealth: Payer: Self-pay

## 2020-10-21 NOTE — Chronic Care Management (AMB) (Addendum)
Chronic Care Management Pharmacy Assistant   Name: Randy Kennedy  MRN: [id number] DOB: [birth year]   Reason for Encounter: Medication Review CCM/ Medication adherence and delivery coordination    Recent office visits: None  Recent consult visits:  10/10/2020  Trans[facility]am[facility]  Dr.Morales  10/09/2020  Dr.Chasnis  PM&R 09/20/2020  [facility] visits:  None in previous 6 months  Medications: Outpatient Encounter Medications as of 10/21/2020  Medication Sig   aspirin EC 81 MG tablet Take 81 mg by mouth daily.   cetirizine (ZYRTEC) 10 MG tablet Take 10 mg by mouth at bedtime. PRN   colchicine 0.6 MG tablet Take 1 tablet (0.6 mg total) by mouth daily.   furosemide (LASIX) 20 MG tablet Take 1 tablet (20 mg total) by mouth 3 (three) times a week. (Patient not taking: Reported on 10/01/2020)   gabapentin (NEURONTIN) 300 MG capsule Take 1 capsule (300 mg total) by mouth 2 (two) times daily AND 2 capsules (600 mg total) at bedtime.   hydrALAZINE (APRESOLINE) 100 MG tablet TAKE 1 AND 1/2 TABLETS BY MOUTH THREE TIMES DAILY, MAY increase TO FOUR TIMES DAILY AS NEEDED FOR HIGH BLOOD PRESSURE   HYDROcodone-acetaminophen (NORCO) 10-325 MG tablet Take 1 tablet by mouth every 6 (six) hours as needed.   loratadine (CLARITIN) 10 MG tablet Take 1 tablet (10 mg total) by mouth daily.   losartan (COZAAR) 100 MG tablet Take 1 tablet (100 mg total) by mouth daily.   meloxicam (MOBIC) 15 MG tablet TAKE 1 TABLET BY MOUTH ONCE DAILY AS NEEDED FOR  GOUT  FLARE   nitroGLYCERIN (NITROSTAT) 0.4 MG SL tablet Place 1 tablet (0.4 mg total) under the tongue every 5 (five) minutes as needed for chest pain.   pravastatin (PRAVACHOL) 40 MG tablet Take 1 tablet (40 mg total) by mouth daily.   predniSONE (DELTASONE) 20 MG tablet Take 1 tablet (20 mg total) by mouth daily. For 1-2 days prn gout flare   probenecid (BENEMID) 500 MG tablet Take 1/2 (one-half) tablet by mouth twice daily   vitamin C (ASCORBIC ACID)  500 MG tablet Take 500 mg by mouth daily.   No facility-administered encounter medications on file as of 10/21/2020.   Reviewed chart for medication changes ahead of medication coordination call.  No OVs, Consults, or hospital visits since last care coordination call/Pharmacist visit.   No medication changes indicated OR if recent visit, treatment plan here.  BP Readings from Last 3 Encounters:  09/10/20 (!) 178/90  08/26/20 (!) 170/86  09/04/19 (!) 144/86    Lab Results  Component Value Date   HGBA1C 5.3 08/22/2019    Multiple attempts made to contact patient. Unsuccessful out reach.   Patient obtains medications through Vials  90 Days   Last adherence delivery included: 08/27/2020 Hydralazine (sync to other maintenance meds due 10/28/20) Gabapentin (takes PRN)  Patient is due for next adherence delivery on:10/28/2020 Attempted to reach patient and review medications and coordinate delivery.  This delivery to include: Losartan 100 mg - 1 tablet daily  Pravastatin 40 mg  - 1 tablet every evening  Probenecid 500 mg  -  tablet BID Hydralazine 100 mg - 1 and 1/2 tablets TID   Not due for the following medications  Colchicine 0.6 mg - PRN Meloxicam 15 mg - PRN Furosemide 20 mg. 1 tab. 3 times a day - patient reported he was not taking 10/01/20 Gabapentin (takes PRN)- received 90 DS on 08/26/20 Hydrocodone PRN- unable to reach patient  to discuss if he needs. Aspirin 81 mg - 1 daily OTC  Vitamin C 500 mg- 1 daily OTC Cetirizine 10 mg - PRN OTC  Patient needs refills for  Losartan 100 mg - 1 tablet daily   Pravastatin 40 mg  - 1 tablet qpm   Probenecid 500 mg  -  tablet BID  Hydralazine   All above medications have been requested from Dr. Danise Mina   Confirmed delivery date of  10/28/2020  advised patient that pharmacy will contact them the morning of delivery.  Star Rating Drugs: Pravastatin 40 mg. 07/30/2020 90DS Losartan 100 mg.07/30/2020 90 DS  Unable to reach patient to  discuss BP  Readings.   Follow-Up:  Coordination of Enhanced Pharmacy Services and Pharmacist Review  [name], CPP notified  Margaretmary Dys, [place] Pharmacy Assistant [phone]  I have reviewed the care management and care coordination activities outlined in this encounter and I am certifying that I agree with the content of this note. No further action required.  [name], PharmD Clinical Pharmacist [facility] [phone]

## 2020-10-22 ENCOUNTER — Other Ambulatory Visit: Payer: Self-pay

## 2020-10-22 MED ORDER — LOSARTAN POTASSIUM 100 MG PO TABS: 100.0000 mg | ORAL_TABLET | Freq: Every day | ORAL | 0 refills | Status: DC

## 2020-10-22 MED ORDER — PROBENECID 500 MG PO TABS: ORAL_TABLET | ORAL | 0 refills | Status: DC

## 2020-10-22 MED ORDER — PRAVASTATIN SODIUM 40 MG PO TABS: 40.0000 mg | ORAL_TABLET | Freq: Every day | ORAL | 0 refills | Status: DC

## 2020-10-22 NOTE — Telephone Encounter (Signed)
Pt has MCR cpe on 10/28/20.  E-scribed 90-day refills for losartan, pravastatin and probenecid.  Forwarded refill request for hydralazine to Dr. Rockey Situ to address.

## 2020-10-22 NOTE — Telephone Encounter (Signed)
-----   Message from [address] sent at 10/22/2020  9:27 AM EDT ----- Regarding: Refills Please send the following refills if appropriate to [facility].   Losartan 100 mg - 1 tablet daily  Pravastatin 40 mg  - 1 tablet qpm  Probenecid 500 mg  -  tablet BID Hydralazine  Thank you, Margaretmary Dys

## 2020-10-23 DIAGNOSIS — H2513 Age-related nuclear cataract, bilateral: Secondary | ICD-10-CM | POA: Diagnosis not present

## 2020-10-24 ENCOUNTER — Other Ambulatory Visit: Payer: Self-pay

## 2020-10-24 ENCOUNTER — Other Ambulatory Visit (INDEPENDENT_AMBULATORY_CARE_PROVIDER_SITE_OTHER): Payer: PPO

## 2020-10-24 DIAGNOSIS — R739 Hyperglycemia, unspecified: Secondary | ICD-10-CM

## 2020-10-24 DIAGNOSIS — E782 Mixed hyperlipidemia: Secondary | ICD-10-CM | POA: Diagnosis not present

## 2020-10-24 DIAGNOSIS — Z125 Encounter for screening for malignant neoplasm of prostate: Secondary | ICD-10-CM | POA: Diagnosis not present

## 2020-10-24 LAB — BASIC METABOLIC PANEL
BUN: 23 mg/dL (ref 6–23)
CO2: 27 mEq/L (ref 19–32)
Calcium: 9.6 mg/dL (ref 8.4–10.5)
Chloride: 104 mEq/L (ref 96–112)
Creatinine, Ser: 1.08 mg/dL (ref 0.40–1.50)
GFR: 69.83 mL/min (ref >60.00)
Glucose, Bld: 102 mg/dL — ABNORMAL HIGH (ref 70–99)
Potassium: 3.8 mEq/L (ref 3.5–5.1)
Sodium: 140 mEq/L (ref 135–145)

## 2020-10-24 LAB — HEMOGLOBIN A1C: Hgb A1c MFr Bld: 5.6 % (ref 4.6–6.5)

## 2020-10-24 LAB — LIPID PANEL
Cholesterol: 186 mg/dL (ref 0–200)
HDL: 76.9 mg/dL (ref >39.00)
LDL Cholesterol: 76 mg/dL (ref 0–99)
NonHDL: 108.74
Total CHOL/HDL Ratio: 2
Triglycerides: 165 mg/dL — ABNORMAL HIGH (ref 0.0–149.0)
VLDL: 33 mg/dL (ref 0.0–40.0)

## 2020-10-24 LAB — PSA: PSA: 0.42 ng/mL (ref 0.10–4.00)

## 2020-10-28 ENCOUNTER — Encounter: Payer: Self-pay | Admitting: Family Medicine

## 2020-10-28 ENCOUNTER — Ambulatory Visit (INDEPENDENT_AMBULATORY_CARE_PROVIDER_SITE_OTHER): Payer: PPO | Admitting: Family Medicine

## 2020-10-28 ENCOUNTER — Other Ambulatory Visit: Payer: Self-pay

## 2020-10-28 VITALS — BP 174/80 | HR 67 | Temp 97.8°F | Ht 66.5 in | Wt 225.2 lb

## 2020-10-28 DIAGNOSIS — G90522 Complex regional pain syndrome I of left lower limb: Secondary | ICD-10-CM

## 2020-10-28 DIAGNOSIS — H9190 Unspecified hearing loss, unspecified ear: Secondary | ICD-10-CM | POA: Diagnosis not present

## 2020-10-28 DIAGNOSIS — F1120 Opioid dependence, uncomplicated: Secondary | ICD-10-CM | POA: Diagnosis not present

## 2020-10-28 DIAGNOSIS — D696 Thrombocytopenia, unspecified: Secondary | ICD-10-CM | POA: Diagnosis not present

## 2020-10-28 DIAGNOSIS — Z7189 Other specified counseling: Secondary | ICD-10-CM

## 2020-10-28 DIAGNOSIS — M1A09X0 Idiopathic chronic gout, multiple sites, without tophus (tophi): Secondary | ICD-10-CM | POA: Diagnosis not present

## 2020-10-28 DIAGNOSIS — G2581 Restless legs syndrome: Secondary | ICD-10-CM

## 2020-10-28 DIAGNOSIS — Z0000 Encounter for general adult medical examination without abnormal findings: Secondary | ICD-10-CM

## 2020-10-28 DIAGNOSIS — N50819 Testicular pain, unspecified: Secondary | ICD-10-CM | POA: Diagnosis not present

## 2020-10-28 DIAGNOSIS — Z1211 Encounter for screening for malignant neoplasm of colon: Secondary | ICD-10-CM | POA: Diagnosis not present

## 2020-10-28 DIAGNOSIS — R011 Cardiac murmur, unspecified: Secondary | ICD-10-CM

## 2020-10-28 DIAGNOSIS — I10 Essential (primary) hypertension: Secondary | ICD-10-CM | POA: Diagnosis not present

## 2020-10-28 DIAGNOSIS — N50812 Left testicular pain: Secondary | ICD-10-CM

## 2020-10-28 DIAGNOSIS — S01301A Unspecified open wound of right ear, initial encounter: Secondary | ICD-10-CM

## 2020-10-28 DIAGNOSIS — E782 Mixed hyperlipidemia: Secondary | ICD-10-CM | POA: Diagnosis not present

## 2020-10-28 DIAGNOSIS — R5382 Chronic fatigue, unspecified: Secondary | ICD-10-CM

## 2020-10-28 LAB — POC URINALSYSI DIPSTICK (AUTOMATED)
Bilirubin, UA: NEGATIVE
Blood, UA: NEGATIVE
Glucose, UA: NEGATIVE
Ketones, UA: NEGATIVE
Leukocytes, UA: NEGATIVE
Nitrite, UA: NEGATIVE
Protein, UA: POSITIVE — AB
Spec Grav, UA: 1.025 (ref 1.010–1.025)
Urobilinogen, UA: 0.2 E.U./dL
pH, UA: 5.5 (ref 5.0–8.0)

## 2020-10-28 MED ORDER — VALSARTAN 320 MG PO TABS: 320.0000 mg | ORAL_TABLET | Freq: Every day | ORAL | 3 refills | Status: DC

## 2020-10-28 NOTE — Assessment & Plan Note (Signed)
Preventative protocols reviewed and updated unless pt declined. Discussed healthy diet and lifestyle.  

## 2020-10-28 NOTE — Progress Notes (Signed)
Patient ID: Randy Kennedy, male    DOB: 02-08-1951, 70 y.o.   MRN: [id number]  This visit was conducted in person.  BP (!) 174/80   Pulse 67   Temp 97.8 F (36.6 C) (Temporal)   Ht 5' 6.5" (1.689 m)   Wt 225 lb 3 oz (102.1 kg)   SpO2 95%   BMI 35.80 kg/m    CC: AMW  Subjective:   HPI: Randy Kennedy is a 70 y.o. male presenting on 10/28/2020 for Medicare Wellness   Saw health advisor last month for medicare wellness visit. Note reviewed.    Hearing Screening   _0  _1  _2  _3  _4  _5  _6  _7  _8   Right ear:   20 40 20  0    Left ear:   0 0 0  0    Comments: Pt is supposed to wear bilateral hearing aids but was not able to get them due ot cost.  He is aware of decreased hearing in left ear.   Vision Screening Comments: Last eye exam, 09/2020.  [place] Visit from 10/28/2020 in [place] at [facility] Total Score 0    Used to wear hearing aides, had to stop due to cost (about 10 yrs ago). 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  Fall Risk  10/28/2020 10/01/2020 08/23/2019 07/01/2018 01/07/2017  Falls in the past year? 0 0 0 0 Yes  Number falls in past yr: - 0 0 - 1  Injury with Fall? - 0 0 - Yes  Risk for fall due to : - Medication side effect Medication side effect;Impaired balance/gait;Impaired mobility - -  Follow up - Falls evaluation completed;Falls prevention discussed Falls evaluation completed;Falls prevention discussed - -   Saw PM&R last month - s/p L TF ESI to L4/5. Ongoing lower back pain but left leg pain is doing some better.   HTN - has started taking mid day hydralazine (set alarm on phone). Home readings also too high 503-888 systolic.   Thinks he had COVID 07/2020 - didn't get tested.   Poorly healing wound to right ear of 4 months duration.  Chronic L testicular ache for 1-2 years - can keep him awake. Manages with NSAID  Preventative: COLONOSCOPY Date: 02/2011 ext  hem, benignpath in chart, rpt 10 yrs ([place]). No blood in stool. Discussed options, requests rpt iFOB.  Prostate cancer screening - rec screening given fmhx (father and uncle).  Lung cancer screening - doesn't qualify  Flu shotyearly COVID vaccine J&J 10/2019 Td- 2014 prevnar 2018, pneumovax 06/2018  Shingles shot - 04/2013 Shingrix - discussed  Advanced directive discussion - has living will at home. Wife is Occupational psychologist. Asked to bring copy.  Seat belt use discussed Sunscreen use and skin screen discussed. No changing moles on skin.  Ex smoker quit 1980 Alcohol - rare Dentist - every 1-2 years  Eye exam yearly Bowel - no constipation  Bladder - no incontinence  Caffeine: 2 cans coke/day Lives with wife and grown son  Occupation: retired, was Agricultural engineer.  On disability for chronic pain Edu: MBA Activity: no regular exercise Deit: good water, fruits/vegetables daily     Relevant past medical, surgical, family and social history reviewed and updated as indicated. Interim medical history since our last visit reviewed. Allergies and medications reviewed and updated. Outpatient Medications Prior to Visit  Medication Sig Dispense Refill  . aspirin EC 81 MG tablet Take 81 mg by  mouth daily.    . cetirizine (ZYRTEC) 10 MG tablet Take 10 mg by mouth at bedtime. PRN    . colchicine 0.6 MG tablet Take 1 tablet (0.6 mg total) by mouth daily. 90 tablet 1  . furosemide (LASIX) 20 MG tablet Take 1 tablet (20 mg total) by mouth 3 (three) times a week. 39 tablet 3  . gabapentin (NEURONTIN) 300 MG capsule Take 1 capsule (300 mg total) by mouth 2 (two) times daily AND 2 capsules (600 mg total) at bedtime. 360 capsule 3  . hydrALAZINE (APRESOLINE) 100 MG tablet TAKE 1 AND 1/2 TABLETS BY MOUTH THREE TIMES DAILY, MAY increase TO FOUR TIMES DAILY AS NEEDED FOR HIGH BLOOD PRESSURE 135 tablet 0  . HYDROcodone-acetaminophen (NORCO) 10-325 MG tablet Take 1 tablet by mouth every 6 (six)  hours as needed. 90 tablet 0  . meloxicam (MOBIC) 15 MG tablet TAKE 1 TABLET BY MOUTH ONCE DAILY AS NEEDED FOR  GOUT  FLARE 30 tablet 0  . nitroGLYCERIN (NITROSTAT) 0.4 MG SL tablet Place 1 tablet (0.4 mg total) under the tongue every 5 (five) minutes as needed for chest pain. 25 tablet 3  . pravastatin (PRAVACHOL) 40 MG tablet Take 1 tablet (40 mg total) by mouth daily. 90 tablet 0  . predniSONE (DELTASONE) 20 MG tablet Take 1 tablet (20 mg total) by mouth daily. For 1-2 days prn gout flare 20 tablet 0  . probenecid (BENEMID) 500 MG tablet Take 1/2 (one-half) tablet by mouth twice daily 90 tablet 0  . vitamin C (ASCORBIC ACID) 500 MG tablet Take 500 mg by mouth daily.    Marland Kitchen losartan (COZAAR) 100 MG tablet Take 1 tablet (100 mg total) by mouth daily. 90 tablet 0  . loratadine (CLARITIN) 10 MG tablet Take 1 tablet (10 mg total) by mouth daily.     No facility-administered medications prior to visit.     Per HPI unless specifically indicated in ROS section below Review of Systems  Constitutional: Negative for activity change, appetite change, chills, fatigue, fever and unexpected weight change.  HENT: Negative for hearing loss.   Eyes: Negative for visual disturbance.  Respiratory: Negative for cough, chest tightness, shortness of breath and wheezing.   Cardiovascular: Positive for chest pain and leg swelling (left). Negative for palpitations.  Gastrointestinal: Positive for constipation. Negative for abdominal distention, abdominal pain, blood in stool, diarrhea, nausea and vomiting.  Genitourinary: Negative for difficulty urinating and hematuria.  Musculoskeletal: Negative for arthralgias, myalgias and neck pain.  Skin: Negative for rash.  Neurological: Negative for dizziness, seizures, syncope and headaches.  Hematological: Negative for adenopathy. Does not bruise/bleed easily.  Psychiatric/Behavioral: Negative for dysphoric mood. The patient is not nervous/anxious.    Objective:  BP (!)  174/80   Pulse 67   Temp 97.8 F (36.6 C) (Temporal)   Ht 5' 6.5" (1.689 m)   Wt 225 lb 3 oz (102.1 kg)   SpO2 95%   BMI 35.80 kg/m   Wt Readings from Last 3 Encounters:  10/28/20 225 lb 3 oz (102.1 kg)  09/10/20 227 lb (103 kg)  08/26/20 225 lb 8 oz (102.3 kg)      Physical Exam Vitals and nursing note reviewed.  Constitutional:      General: He is not in acute distress.    Appearance: Normal appearance. He is well-developed. He is not ill-appearing.  HENT:     Head: Normocephalic and atraumatic.     Right Ear: Hearing, tympanic membrane, ear canal and external  ear normal.     Left Ear: Hearing, tympanic membrane, ear canal and external ear normal.     Ears:     Comments:  R external ear with poorly healing wound  Posterior R pinna with verrucous growths Eyes:     General: No scleral icterus.    Extraocular Movements: Extraocular movements intact.     Conjunctiva/sclera: Conjunctivae normal.     Pupils: Pupils are equal, round, and reactive to light.  Neck:     Thyroid: No thyroid mass or thyromegaly.     Vascular: No carotid bruit.  Cardiovascular:     Rate and Rhythm: Normal rate and regular rhythm.     Pulses: Normal pulses.          Radial pulses are 2+ on the right side and 2+ on the left side.     Heart sounds: Murmur (3/6 systolic USB with radiation to L carotid) heard.    Pulmonary:     Effort: Pulmonary effort is normal. No respiratory distress.     Breath sounds: Normal breath sounds. No wheezing, rhonchi or rales.  Abdominal:     General: Bowel sounds are normal. There is no distension.     Palpations: Abdomen is soft. There is no mass.     Tenderness: There is no abdominal tenderness. There is no guarding or rebound.     Hernia: No hernia is present.  Genitourinary:    Pubic Area: No rash.      Penis: Normal and uncircumcised.      Testes: Normal.        Right: Mass, tenderness, swelling or testicular hydrocele not present. 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        Left: Mass, tenderness, swelling or testicular hydrocele not present. Left testis is descended.     Epididymis:     Right: Normal.     Left: Normal.  Musculoskeletal:        General: Normal range of motion.     Cervical back: Normal range of motion and neck supple.     Right lower leg: No edema.     Left lower leg: No edema.  Lymphadenopathy:     Cervical: No cervical adenopathy.  Skin:    General: Skin is warm and dry.     Findings: No rash.  Neurological:     General: No focal deficit present.     Mental Status: He is alert and oriented to person, place, and time.     Comments: CN grossly intact, station and gait intact  Psychiatric:        Mood and Affect: Mood normal.        Behavior: Behavior normal.        Thought Content: Thought content normal.        Judgment: Judgment normal.       Results for orders placed or performed in visit on 10/28/20  POCT Urinalysis Dipstick (Automated)  Result Value Ref Range   Color, UA yellow    Clarity, UA clear    Glucose, UA Negative Negative   Bilirubin, UA negative    Ketones, UA negative    Spec Grav, UA 1.025 1.010 - 1.025   Blood, UA negative    pH, UA 5.5 5.0 - 8.0   Protein, UA Positive (A) Negative   Urobilinogen, UA 0.2 0.2 or 1.0 E.U./dL   Nitrite, UA negative    Leukocytes, UA Negative Negative   Lab Results  Component Value Date   CHOL  186 10/24/2020   HDL 76.90 10/24/2020   LDLCALC 76 10/24/2020   LDLDIRECT 90.0 07/01/2018   TRIG 165.0 (H) 10/24/2020   CHOLHDL 2 10/24/2020    Lab Results  Component Value Date   HGBA1C 5.6 10/24/2020    Lab Results  Component Value Date   LABURIC 4.8 08/27/2020   Lab Results  Component Value Date   CREATININE 1.08 10/24/2020   BUN 23 10/24/2020   NA 140 10/24/2020   K 3.8 10/24/2020   CL 104 10/24/2020   CO2 27 10/24/2020    Lab Results  Component Value Date   ALT 26 08/26/2020   AST 20 08/26/2020   ALKPHOS 69 08/26/2020   BILITOT 1.1  08/26/2020    Lab Results  Component Value Date   PSA 0.42 10/24/2020   PSA 0.45 08/22/2019   PSA 0.64 07/01/2018   Iron/TIBC/Ferritin/ %Sat    Component Value Date/Time   IRON 126 08/26/2020 0951   FERRITIN 180.6 08/26/2020 0951   IRONPCTSAT 32.4 08/26/2020 0951   Assessment & Plan:  This visit occurred during the SARS-CoV-2 public health emergency.  Safety protocols were in place, including screening questions prior to the visit, additional usage of staff PPE, and extensive cleaning of exam room while observing appropriate contact time as indicated for disinfecting solutions.   Problem List Items Addressed This Visit    HTN (hypertension)    Chronic, uncontrolled. Multiple drug intolerances including lisinopril/hctz, amlodipine, spironolactone and doxazosin.  Complaint with current regimen of losartan and hydralazine. Will change losartan to valsartan for more potent ARB. Update with effect after monitoring at home for 2-3 wks.       Relevant Medications   valsartan (DIOVAN) 320 MG tablet   HLD (hyperlipidemia)    Chronic, stable. Continue pravastatin. The 10-year ASCVD risk score Mikey Bussing DC Brooke Bonito., et al., 2013) is: 23.1%   Values used to calculate the score:     Age: 29 years     Sex: Male     Is Non-Hispanic African American: No     Diabetic: No     Tobacco smoker: No     Systolic Blood Pressure: 494 mmHg     Is BP treated: Yes     HDL Cholesterol: 76.9 mg/dL     Total Cholesterol: 186 mg/dL       Relevant Medications   valsartan (DIOVAN) 320 MG tablet   RLS (restless legs syndrome)    Recent iron levels stable.       Narcotic dependence (HCC)   Systolic murmur    Echo showing mild MR.       Gout    Allopurinol allergy - continue probenecid and colchicine daily, with prednisone RPN gout flare.       RSD lower limb    Established with PM&R - appreciate their care. Continue gabapentin 300/300/600, hydrocodone 10/329m, meloxicam 167mdaily.        Thrombocytopenia (HCC)    Stable period - continue to monitor.        Health maintenance examination - Primary    Preventative protocols reviewed and updated unless pt declined. Discussed healthy diet and lifestyle.       Advanced care planning/counseling discussion    Advanced directive discussion - has living will at home. Wife is HCOccupational psychologistAsked to bring copy      Chronic fatigue   Severe obesity (BMI 35.0-39.9) with comorbidity ([place]   Encouraged healthy diet for goal sustainable weight loss.  Hearing loss    Previously hearing aides unaffordable. Will refer to audiologist for re eval.       Relevant Orders   Ambulatory referral to Audiology   Open wound of right external ear    Poorly healing wound to R ear present for months- will refer to derm r/o skin cancer      Relevant Orders   Ambulatory referral to Dermatology   Left testicular pain    Chronic, present for 1-2 years. Benign exam. Check scrotal US.       Relevant Orders   US SCROTUM W/DOPPLER    Other Visit Diagnoses    Pain in testicle, unspecified laterality       Relevant Orders   POCT Urinalysis Dipstick (Automated) (Completed)   Special screening for malignant neoplasms, colon       Relevant Orders   Fecal occult blood, imunochemical       Meds ordered this encounter  Medications  . valsartan (DIOVAN) 320 MG tablet    Sig: Take 1 tablet (320 mg total) by mouth daily.    Dispense:  90 tablet    Refill:  3    To replace losartan   Orders Placed This Encounter  Procedures  . Fecal occult blood, imunochemical    Standing Status:   Future    Standing Expiration Date:   10/28/2021  . US SCROTUM W/DOPPLER    Standing Status:   Future    Standing Expiration Date:   10/31/2021    Order Specific Question:   Reason for Exam (SYMPTOM  OR DIAGNOSIS REQUIRED)    Answer:   chronic left testicular pain    Order Specific Question:   Preferred imaging location?    Answer:   [facility]  .  Ambulatory referral to Audiology    Referral Priority:   Routine    Referral Type:   Audiology Exam    Referral Reason:   Specialty Services Required    Number of Visits Requested:   1  . Ambulatory referral to Dermatology    Referral Priority:   Routine    Referral Type:   Consultation    Referral Reason:   Specialty Services Required    Requested Specialty:   Dermatology    Number of Visits Requested:   1  . POCT Urinalysis Dipstick (Automated)    Patient instructions: Pass by lab to pick up stool kit.  For blood pressure - change losartan to valsartan 386m daily. Continue hydralazine and lasix. Update u[place]ith BP readings at home after 2-3 weeks.  Bring uKoreacopy of your living will to update chart.  If interested, check with pharmacy about new 2 shot shingles series (shingrix). For testicular pain - we will check ultrasound and urine test today.  Return as needed or in 3 months for chronic pain visit.  We will refer you to audiologist for hearing evaluation.   Follow up plan: Return in about 3 months (around 01/27/2021) for follow up visit.  JRia Bush MD

## 2020-10-28 NOTE — Patient Instructions (Addendum)
Pass by lab to pick up stool kit.  For blood pressure - change losartan to valsartan 347m daily. Continue hydralazine and lasix. Update u[place]ith BP readings at home after 2-3 weeks.  Bring uKoreacopy of your living will to update chart.  If interested, check with pharmacy about new 2 shot shingles series (shingrix). For testicular pain - we will check ultrasound and urine test today.  Return as needed or in 3 months for chronic pain visit.  We will refer you to audiologist for hearing evaluation.   Health Maintenance After Age 3236After age [address] you are at a higher risk for certain long-term diseases and infections as well as injuries from falls. Falls are a major cause of broken bones and head injuries in people who are older than age 94113 Getting regular preventive care can help to keep you healthy and well. Preventive care includes getting regular testing and making lifestyle changes as recommended by your health care provider. Talk with your health care provider about:  Which screenings and tests you should have. A screening is a test that checks for a disease when you have no symptoms.  A diet and exercise plan that is right for you. What should I know about screenings and tests to prevent falls? Screening and testing are the best ways to find a health problem early. Early diagnosis and treatment give you the best chance of managing medical conditions that are common after age 94[address] Certain conditions and lifestyle choices may make you more likely to have a fall. Your health care provider may recommend:  Regular vision checks. Poor vision and conditions such as cataracts can make you more likely to have a fall. If you wear glasses, make sure to get your prescription updated if your vision changes.  Medicine review. Work with your health care provider to regularly review all of the medicines you are taking, including over-the-counter medicines. Ask your health care provider about any side effects that  may make you more likely to have a fall. Tell your health care provider if any medicines that you take make you feel dizzy or sleepy.  Osteoporosis screening. Osteoporosis is a condition that causes the bones to get weaker. This can make the bones weak and cause them to break more easily.  Blood pressure screening. Blood pressure changes and medicines to control blood pressure can make you feel dizzy.  Strength and balance checks. Your health care provider may recommend certain tests to check your strength and balance while standing, walking, or changing positions.  Foot health exam. Foot pain and numbness, as well as not wearing proper footwear, can make you more likely to have a fall.  Depression screening. You may be more likely to have a fall if you have a fear of falling, feel emotionally low, or feel unable to do activities that you used to do.  Alcohol use screening. Using too much alcohol can affect your balance and may make you more likely to have a fall. What actions can I take to lower my risk of falls? General instructions  Talk with your health care provider about your risks for falling. Tell your health care provider if: ? You fall. Be sure to tell your health care provider about all falls, even ones that seem minor. ? You feel dizzy, sleepy, or off-balance.  Take over-the-counter and prescription medicines only as told by your health care provider. These include any supplements.  Eat a healthy diet and maintain a healthy weight. A  healthy diet includes low-fat dairy products, low-fat (lean) meats, and fiber from whole grains, beans, and lots of fruits and vegetables. Home safety  Remove any tripping hazards, such as rugs, cords, and clutter.  Install safety equipment such as grab bars in bathrooms and safety rails on stairs.  Keep rooms and walkways well-lit. Activity  Follow a regular exercise program to stay fit. This will help you maintain your balance. Ask your  health care provider what types of exercise are appropriate for you.  If you need a cane or walker, use it as recommended by your health care provider.  Wear supportive shoes that have nonskid soles.   Lifestyle  Do not drink alcohol if your health care provider tells you not to drink.  If you drink alcohol, limit how much you have: ? 0-1 drink a day for women. ? 0-2 drinks a day for men.  Be aware of how much alcohol is in your drink. In the [place] one drink equals one typical bottle of beer (12 oz), one-half glass of wine (5 oz), or one shot of hard liquor (1 oz).  Do not use any products that contain nicotine or tobacco, such as cigarettes and e-cigarettes. If you need help quitting, ask your health care provider. Summary  Having a healthy lifestyle and getting preventive care can help to protect your health and wellness after age 49.  Screening and testing are the best way to find a health problem early and help you avoid having a fall. Early diagnosis and treatment give you the best chance for managing medical conditions that are more common for people who are older than age 66.  Falls are a major cause of broken bones and head injuries in people who are older than age 77. Take precautions to prevent a fall at home.  Work with your health care provider to learn what changes you can make to improve your health and wellness and to prevent falls. This information is not intended to replace advice given to you by your health care provider. Make sure you discuss any questions you have with your health care provider. Document Revised: 11/03/2018 Document Reviewed: 05/26/2017 Elsevier Patient Education  2021 [employer].

## 2020-10-28 NOTE — Assessment & Plan Note (Signed)
Advanced directive discussion - has living will at home. Wife is Occupational psychologist. Asked to bring copy

## 2020-10-29 ENCOUNTER — Other Ambulatory Visit: Payer: Self-pay | Admitting: Family Medicine

## 2020-10-29 DIAGNOSIS — M542 Cervicalgia: Secondary | ICD-10-CM | POA: Diagnosis not present

## 2020-10-29 DIAGNOSIS — M48062 Spinal stenosis, lumbar region with neurogenic claudication: Secondary | ICD-10-CM | POA: Diagnosis not present

## 2020-10-29 DIAGNOSIS — M5412 Radiculopathy, cervical region: Secondary | ICD-10-CM | POA: Diagnosis not present

## 2020-10-29 DIAGNOSIS — M5442 Lumbago with sciatica, left side: Secondary | ICD-10-CM | POA: Diagnosis not present

## 2020-10-29 DIAGNOSIS — G8929 Other chronic pain: Secondary | ICD-10-CM | POA: Diagnosis not present

## 2020-10-29 NOTE — Telephone Encounter (Signed)
Name of Medication: Hydrocodone-APAP Name of Pharmacy: [facility] Date and Quantity: 10/01/20, #90 Last Office Visit and Type: 10/28/20, AWV prt 2 Next Office Visit and Type: none Last Controlled Substance Agreement Date: 09/29/16 Last UDS: 03/10/19

## 2020-10-31 ENCOUNTER — Encounter: Payer: Self-pay | Admitting: Family Medicine

## 2020-10-31 DIAGNOSIS — N50812 Left testicular pain: Secondary | ICD-10-CM | POA: Insufficient documentation

## 2020-10-31 DIAGNOSIS — H9190 Unspecified hearing loss, unspecified ear: Secondary | ICD-10-CM | POA: Insufficient documentation

## 2020-10-31 DIAGNOSIS — S01301A Unspecified open wound of right ear, initial encounter: Secondary | ICD-10-CM | POA: Insufficient documentation

## 2020-10-31 MED ORDER — HYDROCODONE-ACETAMINOPHEN 10-325 MG PO TABS: 1.0000 | ORAL_TABLET | Freq: Four times a day (QID) | ORAL | 0 refills | Status: DC | PRN

## 2020-10-31 NOTE — Assessment & Plan Note (Signed)
Chronic, uncontrolled. Multiple drug intolerances including lisinopril/hctz, amlodipine, spironolactone and doxazosin.  Complaint with current regimen of losartan and hydralazine. Will change losartan to valsartan for more potent ARB. Update with effect after monitoring at home for 2-3 wks.

## 2020-10-31 NOTE — Assessment & Plan Note (Addendum)
Chronic, present for 1-2 years. Benign exam. Check scrotal US. UA today reassuring

## 2020-10-31 NOTE — Assessment & Plan Note (Signed)
Recent iron levels stable.

## 2020-10-31 NOTE — Assessment & Plan Note (Signed)
Previously hearing aides unaffordable. Will refer to audiologist for re eval.

## 2020-10-31 NOTE — Assessment & Plan Note (Signed)
Stable period - continue to monitor. 

## 2020-10-31 NOTE — Assessment & Plan Note (Signed)
Echo showing mild MR.

## 2020-10-31 NOTE — Telephone Encounter (Signed)
ERx 

## 2020-10-31 NOTE — Assessment & Plan Note (Signed)
Established with PM&R - appreciate their care. Continue gabapentin 300/300/600, hydrocodone 10/325mg , meloxicam 15mg  daily.

## 2020-10-31 NOTE — Assessment & Plan Note (Signed)
Chronic, stable. Continue pravastatin. The 10-year ASCVD risk score Randy Kennedy Randy Kennedy Randy Kennedy., et al., 2013) is: 23.1%   Values used to calculate the score:     Age: 70 years     Sex: Male     Is Non-Hispanic African American: No     Diabetic: No     Tobacco smoker: No     Systolic Blood Pressure: 175 mmHg     Is BP treated: Yes     HDL Cholesterol: 76.9 mg/dL     Total Cholesterol: 186 mg/dL

## 2020-10-31 NOTE — Assessment & Plan Note (Signed)
Encouraged healthy diet for goal sustainable weight loss.

## 2020-10-31 NOTE — Assessment & Plan Note (Signed)
Poorly healing wound to R ear present for months- will refer to derm r/o skin cancer

## 2020-10-31 NOTE — Assessment & Plan Note (Addendum)
Allopurinol allergy - continue probenecid and colchicine daily, with prednisone RPN gout flare.

## 2020-11-04 ENCOUNTER — Other Ambulatory Visit (INDEPENDENT_AMBULATORY_CARE_PROVIDER_SITE_OTHER): Payer: PPO

## 2020-11-04 ENCOUNTER — Telehealth: Payer: Self-pay | Admitting: Radiology

## 2020-11-04 DIAGNOSIS — Z1211 Encounter for screening for malignant neoplasm of colon: Secondary | ICD-10-CM | POA: Diagnosis not present

## 2020-11-04 DIAGNOSIS — R195 Other fecal abnormalities: Secondary | ICD-10-CM

## 2020-11-04 LAB — FECAL OCCULT BLOOD, IMMUNOCHEMICAL: Fecal Occult Bld: POSITIVE — AB

## 2020-11-04 NOTE — Telephone Encounter (Signed)
[facility] called a POSITIVE ifob result. Result given to Dr Danise Mina

## 2020-11-05 ENCOUNTER — Telehealth: Payer: Self-pay

## 2020-11-05 MED ORDER — HYDROCODONE-ACETAMINOPHEN 10-325 MG PO TABS: 1.0000 | ORAL_TABLET | Freq: Four times a day (QID) | ORAL | 0 refills | Status: DC | PRN

## 2020-11-05 NOTE — Telephone Encounter (Signed)
[facility] is having shipping delays for our C2 order. We discussed with patient and he would like his hydrocodone/acetaminophen sent to [facility] in [place]. [facility] has discontinued the original prescription sent on 10/31/20. Please send a new prescription if approved.  [name], PharmD Clinical Pharmacist [facility] [phone]

## 2020-11-05 NOTE — Telephone Encounter (Signed)
Notified pt by phn.  Expresses his thanks.

## 2020-11-05 NOTE — Telephone Encounter (Signed)
Amy from [facility] called and stated that the medication Hydrocodone-Acetaminophen 10-325 mg is on back order and will not be at the pharmacy until Friday. Amy has contacted the patient and he would like it sent to [facility] on [address]. Amy has cancelled the prescription at the pharmacy. Please advise.

## 2020-11-05 NOTE — Addendum Note (Signed)
Addended by: [name] on: 11/05/2020 01:50 PM   Modules accepted: Orders

## 2020-11-05 NOTE — Telephone Encounter (Signed)
Spoke with pt relaying results and Dr. G's message.  Pt verbalizes understanding.  

## 2020-11-05 NOTE — Telephone Encounter (Signed)
Already replied via refill request thanks.

## 2020-11-05 NOTE — Telephone Encounter (Signed)
plz notify ifob returned positive for blood For this reason recommend proceed with colonoscopy. Referral placed to [facility].

## 2020-11-05 NOTE — Addendum Note (Signed)
Addended by: [name] on: 11/05/2020 11:14 AM   Modules accepted: Orders

## 2020-11-05 NOTE — Telephone Encounter (Signed)
ERx to [facility]. plz notify pt.

## 2020-11-06 DIAGNOSIS — H2511 Age-related nuclear cataract, right eye: Secondary | ICD-10-CM | POA: Diagnosis not present

## 2020-11-07 ENCOUNTER — Other Ambulatory Visit: Payer: Self-pay | Admitting: Family Medicine

## 2020-11-11 ENCOUNTER — Other Ambulatory Visit: Payer: Self-pay | Admitting: Family Medicine

## 2020-11-13 ENCOUNTER — Telehealth: Payer: Self-pay

## 2020-11-13 NOTE — Chronic Care Management (AMB) (Addendum)
Chronic Care Management Pharmacy Assistant   Name: Randy Kennedy  MRN: [id number] DOB: Jun 13, 1951  Reason for Encounter: Medication Review- Medication Adherence and Delivery Coordination  Recent office visits:  10/28/20- Dr. Danise Mina- PCP- Discontinued losartan and started Valsartan 320 mg. Patient no longer taking loratadine 10 mg.   Recent consult visits:  10/29/20 Dr. Girtha Hake- PM&R- No Changes  Hospital visits:  None in previous 6 months  Medications: Outpatient Encounter Medications as of 11/13/2020  Medication Sig   aspirin EC 81 MG tablet Take 81 mg by mouth daily.   cetirizine (ZYRTEC) 10 MG tablet Take 10 mg by mouth at bedtime. PRN   colchicine 0.6 MG tablet Take 1 tablet (0.6 mg total) by mouth daily.   furosemide (LASIX) 20 MG tablet Take 1 tablet (20 mg total) by mouth 3 (three) times a week.   gabapentin (NEURONTIN) 300 MG capsule Take 1 capsule (300 mg total) by mouth 2 (two) times daily AND 2 capsules (600 mg total) at bedtime.   hydrALAZINE (APRESOLINE) 100 MG tablet TAKE 1 AND 1/2 TABLETS BY MOUTH THREE TIMES DAILY, MAY increase TO FOUR TIMES DAILY AS NEEDED FOR HIGH BLOOD PRESSURE   HYDROcodone-acetaminophen (NORCO) 10-325 MG tablet Take 1 tablet by mouth every 6 (six) hours as needed.   meloxicam (MOBIC) 15 MG tablet TAKE 1 TABLET BY MOUTH ONCE DAILY AS NEEDED FOR  GOUT  FLARE   nitroGLYCERIN (NITROSTAT) 0.4 MG SL tablet Place 1 tablet (0.4 mg total) under the tongue every 5 (five) minutes as needed for chest pain.   pravastatin (PRAVACHOL) 40 MG tablet Take 1 tablet (40 mg total) by mouth daily.   predniSONE (DELTASONE) 20 MG tablet Take 1 tablet (20 mg total) by mouth daily. For 1-2 days prn gout flare   probenecid (BENEMID) 500 MG tablet Take 1/2 (one-half) tablet by mouth twice daily   valsartan (DIOVAN) 320 MG tablet Take 1 tablet (320 mg total) by mouth daily.   vitamin C (ASCORBIC ACID) 500 MG tablet Take 500 mg by mouth daily.   No  facility-administered encounter medications on file as of 11/13/2020.   BP Readings from Last 3 Encounters:  10/28/20 (!) 174/80  09/10/20 (!) 178/90  08/26/20 (!) 170/86    Lab Results  Component Value Date   HGBA1C 5.6 10/24/2020     Recent OV, Consult or Hospital visit:  10/28/20- Dr. Danise Mina- PCP, 10/29/20 Dr. Girtha Hake- PM&R Recent medication changes indicated:  Discontinued losartan and started Valsartan 320 mg. Patient no longer taking loratadine 10 mg.   Patient obtains medications through Vials  90 Days   Last adherence delivery date: 10/28/20    Losartan 100 mg - 1 tablet daily  Pravastatin 40 mg  - 1 tablet qpm  Probenecid 500 mg  -  tablet BID Hydralazine 100 mg- Take 1.5 tablets TID may increase to QID if needed for blood pressure  Patient declined the following medications last delivery: Colchicine 0.6 mg - PRN Furosemide 20 mg. 1 tab. 3 times a day - patient reported he was not taking 10/01/20 Gabapentin (takes PRN)- received 90 DS on 08/26/20 Hydrocodone PRN- unable to reach patient to discuss if he needs. OTC Aspirin 81 mg - 1 daily OTC  Vitamin C 500 mg- 1 daily OTC Cetirizine 10 mg - PRN OTC  Patient is due for next adherence delivery on: 01/26/21  Spoke with patient on 11/13/20 and reviewed medications to ensure patient does not need any medications at this time.  This delivery to include: Vials  90 Days  No medications needed this month.  Patient declined the following medications this month: Due to 90 DS 10/28/20 Losartan 100 mg - 1 tablet daily  (discontinued. Replaced with valsartan) Pravastatin 40 mg  - 1 tablet qpm  Probenecid 500 mg  -  tablet BID Hydralazine 100 mg- Take 1.5 tablets TID may increase to QID if needed for blood pressure PRN Colchicine 0.6 mg - PRN Furosemide 20 mg. 1 tab. 3 times a day - patient reported he was not taking 10/01/20 Gabapentin (takes PRN)- received 90 DS on 08/26/20- States as of 11/13/20 he still has full bottle at  this time. Hydrocodone PRN OTC Aspirin 81 mg - 1 daily OTC  Vitamin C 500 mg- 1 daily OTC Cetirizine 10 mg - PRN OTC  Patient does not need refills on any medications.  No medications needed for delivery date of 11/24/20.  Recent blood pressure readings are as follows: Patient only had 1 reading 180/79. Reading was done 11/13/20  Follow-Up:  Pharmacist Review  [name], CPP notified  Margaretmary Dys, [place] Assistant [phone]  I have reviewed the care management and care coordination activities outlined in this encounter and I am certifying that I agree with the content of this note. BP review again next month.  [name], PharmD Clinical Pharmacist [facility] [phone]

## 2020-11-14 ENCOUNTER — Encounter: Payer: Self-pay | Admitting: Ophthalmology

## 2020-11-15 ENCOUNTER — Other Ambulatory Visit: Payer: Self-pay

## 2020-11-15 DIAGNOSIS — M5442 Lumbago with sciatica, left side: Secondary | ICD-10-CM | POA: Diagnosis not present

## 2020-11-15 DIAGNOSIS — G8929 Other chronic pain: Secondary | ICD-10-CM | POA: Diagnosis not present

## 2020-11-15 DIAGNOSIS — M48062 Spinal stenosis, lumbar region with neurogenic claudication: Secondary | ICD-10-CM | POA: Diagnosis not present

## 2020-11-15 NOTE — Telephone Encounter (Signed)
Gabapentin Last rx:  10/16/19, #360/3 Last OV:  10/28/20, AWV prt 2 Next OV:  none

## 2020-11-17 MED ORDER — GABAPENTIN 300 MG PO CAPS
ORAL_CAPSULE | ORAL | 3 refills | Status: DC
Start: 2020-11-17 — End: 2022-01-14

## 2020-11-19 ENCOUNTER — Other Ambulatory Visit: Payer: Self-pay

## 2020-11-19 ENCOUNTER — Encounter: Payer: Self-pay | Admitting: Ophthalmology

## 2020-11-22 NOTE — Discharge Instructions (Signed)

## 2020-11-24 DIAGNOSIS — Z9841 Cataract extraction status, right eye: Secondary | ICD-10-CM

## 2020-11-24 HISTORY — DX: Cataract extraction status, right eye: Z98.41

## 2020-11-25 ENCOUNTER — Other Ambulatory Visit: Payer: Self-pay | Admitting: Family Medicine

## 2020-11-25 NOTE — Telephone Encounter (Signed)
Name of Medication: Hydrocodone-APAP Name of Pharmacy: [facility] Last Fill or Written Date and Quantity: 11/05/20, #90 Last Office Visit and Type: 10/28/20, AWV prt 2 Next Office Visit and Type: none Last Controlled Substance Agreement Date: 09/29/16 Last UDS: 03/10/19

## 2020-11-26 ENCOUNTER — Ambulatory Visit
Admission: RE | Admit: 2020-11-26 | Discharge: 2020-11-26 | Disposition: A | Discharge status: Home / Self Care | Payer: PPO | Attending: Ophthalmology | Admitting: Ophthalmology

## 2020-11-26 ENCOUNTER — Encounter: Payer: Self-pay | Admitting: Ophthalmology

## 2020-11-26 ENCOUNTER — Encounter
Admission: RE | Disposition: A | Discharge status: Home / Self Care | Payer: Self-pay | Source: Home / Self Care | Attending: Ophthalmology

## 2020-11-26 ENCOUNTER — Ambulatory Visit: Payer: PPO | Admitting: Anesthesiology

## 2020-11-26 ENCOUNTER — Other Ambulatory Visit: Payer: Self-pay

## 2020-11-26 DIAGNOSIS — Z888 Allergy status to other drugs, medicaments and biological substances status: Secondary | ICD-10-CM | POA: Insufficient documentation

## 2020-11-26 DIAGNOSIS — H25811 Combined forms of age-related cataract, right eye: Secondary | ICD-10-CM | POA: Diagnosis not present

## 2020-11-26 DIAGNOSIS — Z7982 Long term (current) use of aspirin: Secondary | ICD-10-CM | POA: Diagnosis not present

## 2020-11-26 DIAGNOSIS — Z8616 Personal history of COVID-19: Secondary | ICD-10-CM | POA: Diagnosis not present

## 2020-11-26 DIAGNOSIS — Z79899 Other long term (current) drug therapy: Secondary | ICD-10-CM | POA: Insufficient documentation

## 2020-11-26 DIAGNOSIS — Z87891 Personal history of nicotine dependence: Secondary | ICD-10-CM | POA: Insufficient documentation

## 2020-11-26 DIAGNOSIS — H2511 Age-related nuclear cataract, right eye: Secondary | ICD-10-CM | POA: Insufficient documentation

## 2020-11-26 HISTORY — DX: Presence of dental prosthetic device (complete) (partial): Z97.2

## 2020-11-26 HISTORY — PX: CATARACT EXTRACTION W/PHACO: SHX586

## 2020-11-26 HISTORY — DX: Motion sickness, initial encounter: T75.3XXA

## 2020-11-26 HISTORY — DX: Presence of external hearing-aid: Z97.4

## 2020-11-26 SURGERY — PHACOEMULSIFICATION, CATARACT, WITH IOL INSERTION
Anesthesia: Monitor Anesthesia Care | Site: Eye | Laterality: Right

## 2020-11-26 MED ORDER — MIDAZOLAM HCL 2 MG/2ML IJ SOLN
INTRAMUSCULAR | Status: DC | PRN
  Administered 2020-11-26 (×2): 1 mg via INTRAVENOUS

## 2020-11-26 MED ORDER — ARMC OPHTHALMIC DILATING DROPS
1.0000 "application " | OPHTHALMIC | Status: DC | PRN
  Administered 2020-11-26 (×3): 1 via OPHTHALMIC

## 2020-11-26 MED ORDER — NA CHONDROIT SULF-NA HYALURON 40-17 MG/ML IO SOLN
INTRAOCULAR | Status: DC | PRN
  Administered 2020-11-26: 1 mL via INTRAOCULAR

## 2020-11-26 MED ORDER — LIDOCAINE HCL (PF) 2 % IJ SOLN
INTRAOCULAR | Status: DC | PRN
  Administered 2020-11-26: 1 mL

## 2020-11-26 MED ORDER — TETRACAINE HCL 0.5 % OP SOLN
1.0000 [drp] | OPHTHALMIC | Status: DC | PRN
  Administered 2020-11-26 (×3): 1 [drp] via OPHTHALMIC

## 2020-11-26 MED ORDER — ACETAMINOPHEN 160 MG/5ML PO SOLN: 325.0000 mg | ORAL | Status: DC | PRN

## 2020-11-26 MED ORDER — ACETAMINOPHEN 325 MG PO TABS: 325.0000 mg | ORAL_TABLET | ORAL | Status: DC | PRN

## 2020-11-26 MED ORDER — EPINEPHRINE PF 1 MG/ML IJ SOLN
INTRAOCULAR | Status: DC | PRN
  Administered 2020-11-26: 92 mL via OPHTHALMIC

## 2020-11-26 MED ORDER — ONDANSETRON HCL 4 MG/2ML IJ SOLN: 4.0000 mg | Freq: Once | INTRAMUSCULAR | Status: DC | PRN

## 2020-11-26 MED ORDER — BRIMONIDINE TARTRATE-TIMOLOL 0.2-0.5 % OP SOLN
OPHTHALMIC | Status: DC | PRN
  Administered 2020-11-26: 1 [drp] via OPHTHALMIC

## 2020-11-26 MED ORDER — MOXIFLOXACIN HCL 0.5 % OP SOLN
OPHTHALMIC | Status: DC | PRN
  Administered 2020-11-26: 0.2 mL via OPHTHALMIC

## 2020-11-26 MED ORDER — FENTANYL CITRATE (PF) 100 MCG/2ML IJ SOLN
INTRAMUSCULAR | Status: DC | PRN
  Administered 2020-11-26 (×2): 50 ug via INTRAVENOUS

## 2020-11-26 SURGICAL SUPPLY — 17 items
CANNULA ANT/CHMB 27GA (MISCELLANEOUS) ×4 IMPLANT
GLOVE SURG TRIUMPH 8.0 PF LTX (GLOVE) ×2 IMPLANT
GOWN STRL REUS W/ TWL LRG LVL3 (GOWN DISPOSABLE) ×2 IMPLANT
GOWN STRL REUS W/TWL LRG LVL3 (GOWN DISPOSABLE) ×4
LENS IOL ACRSF VT TRC 315 25.5 ×1 IMPLANT
LENS IOL ACRYSOF VIVITY 25.5 ×2 IMPLANT
LENS IOL VIVITY 315 25.5 ×1 IMPLANT
MARKER SKIN DUAL TIP RULER LAB (MISCELLANEOUS) ×2 IMPLANT
NEEDLE FILTER BLUNT 18X 1/2SAF (NEEDLE) ×1
NEEDLE FILTER BLUNT 18X1 1/2 (NEEDLE) ×1 IMPLANT
PACK EYE AFTER SURG (MISCELLANEOUS) ×2 IMPLANT
PACK OPTHALMIC (MISCELLANEOUS) ×2 IMPLANT
PACK PORFILIO (MISCELLANEOUS) ×2 IMPLANT
SYR 3ML LL SCALE MARK (SYRINGE) ×2 IMPLANT
SYR TB 1ML LUER SLIP (SYRINGE) ×2 IMPLANT
WATER STERILE IRR 250ML POUR (IV SOLUTION) ×2 IMPLANT
WIPE NON LINTING 3.25X3.25 (MISCELLANEOUS) ×2 IMPLANT

## 2020-11-26 NOTE — Anesthesia Postprocedure Evaluation (Signed)
Anesthesia Post Note  Patient: Randy Kennedy  Procedure(s) Performed: CATARACT EXTRACTION PHACO AND INTRAOCULAR LENS PLACEMENT (IOC) RIGHT VIVITY TORIC LENS 7.17 00:49.6 (Right Eye)     Patient location during evaluation: PACU Anesthesia Type: MAC Level of consciousness: awake Pain management: pain level controlled Vital Signs Assessment: post-procedure vital signs reviewed and stable Respiratory status: respiratory function stable Cardiovascular status: stable Postop Assessment: no apparent nausea or vomiting Anesthetic complications: no   No complications documented.  Veda Canning

## 2020-11-26 NOTE — Transfer of Care (Signed)
Immediate Anesthesia Transfer of Care Note  Patient: Randy Kennedy  Procedure(s) Performed: CATARACT EXTRACTION PHACO AND INTRAOCULAR LENS PLACEMENT (IOC) RIGHT VIVITY TORIC LENS 7.17 00:49.6 (Right Eye)  Patient Location: PACU  Anesthesia Type: MAC  Level of Consciousness: awake, alert  and patient cooperative  Airway and Oxygen Therapy: Patient Spontanous Breathing  Post-op Assessment: Post-op Vital signs reviewed, Patient's Cardiovascular Status Stable, Respiratory Function Stable, Patent Airway and No signs of Nausea or vomiting  Post-op Vital Signs: Reviewed and stable  Complications: No complications documented.

## 2020-11-26 NOTE — Anesthesia Preprocedure Evaluation (Signed)
Anesthesia Evaluation  Patient identified by MRN, date of birth, ID band Patient awake    Reviewed: Allergy & Precautions, NPO status   Airway Mallampati: II  TM Distance: >3 FB     Dental   Pulmonary shortness of breath, sleep apnea (no cpap) , former smoker,    Pulmonary exam normal        Cardiovascular hypertension,  Rhythm:Regular Rate:Normal  HLD   Neuro/Psych  Neuromuscular disease (RSD lower limb)    GI/Hepatic GERD  ,  Endo/Other  Obesity  Renal/GU      Musculoskeletal  (+) Arthritis ,   Abdominal   Peds  Hematology   Anesthesia Other Findings   Reproductive/Obstetrics                             Anesthesia Physical Anesthesia Plan  ASA: III  Anesthesia Plan: MAC   Post-op Pain Management:    Induction: Intravenous  PONV Risk Score and Plan: TIVA, Midazolam and Treatment may vary due to age or medical condition  Airway Management Planned: Natural Airway and Nasal Cannula  Additional Equipment:   Intra-op Plan:   Post-operative Plan:   Informed Consent: I have reviewed the patients History and Physical, chart, labs and discussed the procedure including the risks, benefits and alternatives for the proposed anesthesia with the patient or authorized representative who has indicated his/her understanding and acceptance.       Plan Discussed with: CRNA  Anesthesia Plan Comments:         Anesthesia Quick Evaluation

## 2020-11-26 NOTE — Anesthesia Procedure Notes (Signed)
Procedure Name: MAC Date/Time: 11/26/2020 12:00 PM Performed by: Vanetta Shawl, CRNA Pre-anesthesia Checklist: Patient identified, Emergency Drugs available, Suction available, Timeout performed and Patient being monitored Patient Re-evaluated:Patient Re-evaluated prior to induction Oxygen Delivery Method: Nasal cannula Placement Confirmation: positive ETCO2

## 2020-11-26 NOTE — Op Note (Signed)
PREOPERATIVE DIAGNOSIS:  Nuclear sclerotic cataract of the right eye.   POSTOPERATIVE DIAGNOSIS:  Nuclear sclerotic cataract of the right eye.   OPERATIVE PROCEDURE: Procedure(s): CATARACT EXTRACTION PHACO AND INTRAOCULAR LENS PLACEMENT (IOC) RIGHT VIVITY TORIC LENS 7.17 00:49.6   SURGEON:  Birder Robson, MD.   ANESTHESIA: 1.      Managed anesthesia care. 2.     0.27ml of Shugarcaine was instilled following the paracentesis  Anesthesiologist: Veda Canning, MD CRNA: Vanetta Shawl, CRNA  COMPLICATIONS:  None.   TECHNIQUE:   Stop and chop    DESCRIPTION OF PROCEDURE:  The patient was examined and consented in the preoperative holding area where the aforementioned topical anesthesia was applied to the right eye.  The patient was brought back to the Operating Room where he was sat upright on the gurney and given a target to fixate upon while the eye was marked at the 3:00 and 9:00 position.  The patient was then reclined on the operating table.  The eye was prepped and draped in the usual sterile ophthalmic fashion and a lid speculum was placed. A paracentesis was created with the side port blade and the anterior chamber was filled with viscoelastic. A near clear corneal incision was performed with the steel keratome. A continuous curvilinear capsulorrhexis was performed with a cystotome followed by the capsulorrhexis forceps. Hydrodissection and hydrodelineation were carried out with BSS on a blunt cannula. The lens was removed in a stop and chop technique and the remaining cortical material was removed with the irrigation-aspiration handpiece. The eye was inflated with viscoelastic and the [id number]  lens  was placed in the eye and rotated to within a few degrees of the predetermined orientation.  The remaining viscoelastic was removed from the eye.  The Sinskey hook was used to rotate the toric lens into its final resting place at 010 degrees.  . The eye was inflated to a physiologic pressure and  found to be watertight. 0.77ml of Vigamox was placed in the anterior chamber.  The eye was dressed with Vigamox. The patient was given protective glasses to wear throughout the day and a shield with which to sleep tonight. The patient was also given drops with which to begin a drop regimen today and will follow-up with me in one day. Implant Name Type Inv. Item Serial No. Manufacturer Lot No. LRB No. Used Action  LENS IOL ACRYSOF VIVITY 25.5 - [id number]  LENS IOL ACRYSOF VIVITY 25.5 [id number] ALCON  Right 1 Implanted   Procedure(s) with comments: CATARACT EXTRACTION PHACO AND INTRAOCULAR LENS PLACEMENT (IOC) RIGHT VIVITY TORIC LENS 7.17 00:49.6 (Right) - Latex Sleep Apnea  Electronically signed: Birder Robson 11/26/2020 12:23 PM

## 2020-11-26 NOTE — H&P (Signed)
[facility]   Primary Care Physician:  [name], MD Ophthalmologist: Dr. George Ina  Pre-Procedure History & Physical: HPI:  Randy Kennedy is a 70 y.o. male here for cataract surgery.   Past Medical History:  Diagnosis Date  . Allergic rhinitis   . Arthritis   . Chest pain    a. 11/2011 reportedly normal stress test performed in [place].  . Chronic pain    L ankle and bilateral hips (remote fracture s/p surgeries), lower back (told has herniated disc)  . Closed avulsion fracture of right talus 06/2016  . COVID-19 06/2020  . Eczema   . Erectile dysfunction 09/14/2012  . Gilbert disease   . Gout 2007  . Hearing loss    otosclerosis  . Heartburn    mild, controlled with pepcid  . History of chicken pox   . 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 HLD (hyperlipidemia)   . HTN (hypertension)    difficult to control - clonidine and beta blockers caused bradycardia  . Morbid obesity ([place])   . Motion sickness    Most moving vehicles  . 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 OSA (obstructive sleep apnea) 11/2011 sleep study   a. moderate, AHI 37.4 increased to 80 in REM, on BiPAP 14/10, 97% compliance >4 hrs (06/2013); b. Does not tolerate CPAP.  Marland Kitchen RLS (restless legs syndrome)   . 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 Treated with vicodin, prior tried fentanyl and butrans.  . Systolic murmur   . Wears dentures    Partial lower  . Wears hearing aid in both ears    Does not wear all the time    Past Surgical History:  Procedure Laterality Date  . COLONOSCOPY  02/2011   ext hem, benign polyp, rpt 5 yrs ([place])  . LEG SURGERY  x5   left - after fall at work (3.5 stories)  . MANDIBLE SURGERY  1965   jaw fracture - horse kick  . TYMPANIC MEMBRANE REPAIR Left  1994   otosclerosis    Prior to Admission medications   Medication Sig Start Date End Date Taking? Authorizing Provider  aspirin EC 81 MG tablet Take 81 mg by mouth daily.   Yes [provider]  cetirizine (ZYRTEC) 10 MG tablet Take 10 mg by mouth at bedtime. PRN   Yes [provider]  colchicine 0.6 MG tablet Take 1 tablet (0.6 mg total) by mouth daily. 10/17/20  Yes [name], MD  furosemide (LASIX) 20 MG tablet Take 1 tablet (20 mg total) by mouth 3 (three) times a week. Patient taking differently: Take 20 mg by mouth 3 (three) times a week. M,W,F 09/11/20 12/10/20 Yes Gollan, Kathlene November, MD  gabapentin (NEURONTIN) 300 MG capsule Take 1 capsule (300 mg total) by mouth 2 (two) times daily AND 2 capsules (600 mg total) at bedtime. 11/17/20  Yes [name], MD  hydrALAZINE (APRESOLINE) 100 MG tablet TAKE 1 AND 1/2 TABLETS BY MOUTH THREE TIMES DAILY, MAY increase TO FOUR TIMES DAILY AS NEEDED FOR HIGH BLOOD PRESSURE 11/07/20  Yes [name], MD  HYDROcodone-acetaminophen [facility]) 10-325 MG tablet Take 1 tablet by mouth every 6 (six) hours as needed. 11/05/20  Yes [name], MD  meloxicam (MOBIC) 15 MG  tablet TAKE 1 TABLET BY MOUTH ONCE DAILY AS NEEDED FOR  GOUT  FLARE 04/05/19  Yes [name], MD  nitroGLYCERIN (NITROSTAT) 0.4 MG SL tablet Place 1 tablet (0.4 mg total) under the tongue every 5 (five) minutes as needed for chest pain. 02/16/13  Yes Minna Merritts, MD  pravastatin (PRAVACHOL) 40 MG tablet Take 1 tablet (40 mg total) by mouth daily. Patient taking differently: Take 40 mg by mouth at bedtime. 10/22/20  Yes [name], MD  predniSONE (DELTASONE) 20 MG tablet Take 1 tablet (20 mg total) by mouth daily. For 1-2 days prn gout flare 04/06/19  Yes [name], MD  probenecid (BENEMID) 500 MG tablet Take 1/2 (one-half) tablet by mouth twice daily Patient taking differently: 2 (two) times daily. Take 1/2 (one-half) tablet by mouth  twice daily/ breakfast and bedtime 10/22/20  Yes [name], MD  valsartan (DIOVAN) 320 MG tablet Take 1 tablet (320 mg total) by mouth daily. 10/28/20  Yes [name], MD  vitamin C (ASCORBIC ACID) 500 MG tablet Take 500 mg by mouth daily.   Yes [provider]    Allergies as of 10/24/2020 - Review Complete 10/01/2020  Allergen Reaction Noted  . Allopurinol Other (See Comments) 09/13/2012  . Lisinopril-hydrochlorothiazide  02/16/2013  . Procardia [nifedipine] Other (See Comments) 07/12/2013  . Spironolactone Other (See Comments) 05/28/2017  . Amlodipine Rash 08/01/2013  . Doxazosin mesylate Rash 10/07/2017  . Nortriptyline Rash 07/01/2018  . Uloric [febuxostat] Rash 05/07/2017    Family History  Problem Relation Age of Onset  . Cancer Father 62       prostate  . Cancer Mother        rectal  . Hypertension Mother   . Cancer Paternal Uncle        prostate  . Diabetes Neg Hx   . Stroke Neg Hx   . CAD Neg Hx     Social History   Socioeconomic History  . Marital status: Married    Spouse name: Not on file  . Number of children: Not on file  . Years of education: Not on file  . Highest education level: Not on file  Occupational History  . Not on file  Tobacco Use  . Smoking status: Former Smoker    Packs/day: 1.00    Years: 4.00    Pack years: 4.00    Types: Pipe    Quit date: 07/27/1978    Years since quitting: 42.3  . Smokeless tobacco: Never Used  Vaping Use  . Vaping Use: Never used  Substance and Sexual Activity  . Alcohol use: No    Alcohol/week: 0.0 standard drinks  . Drug use: No  . Sexual activity: Not on file  Other Topics Concern  . Not on file  Social History Narrative   Caffeine: 2 cans coke/day   Lives with wife and grown son    Occupation: retired, was Agricultural engineer.   On disability for chronic pain   Edu: MBA   Activity: no regular exercise   Deit: good water, fruits/vegetables daily   Social Determinants of  Health   Financial Resource Strain: Low Risk   . Difficulty of Paying Living Expenses: Not hard at all  Food Insecurity: No Food Insecurity  . Worried About Charity fundraiser in the Last Year: Never true  . Ran Out of Food in the Last Year: Never true  Transportation Needs: No Transportation Needs  . Lack of Transportation (Medical): No  .  Lack of Transportation (Non-Medical): No  Physical Activity: Inactive  . Days of Exercise per Week: 0 days  . Minutes of Exercise per Session: 0 min  Stress: No Stress Concern Present  . Feeling of Stress : Not at all  Social Connections: Not on file  Intimate Partner Violence: Not At Risk  . Fear of Current or Ex-Partner: No  . Emotionally Abused: No  . Physically Abused: No  . Sexually Abused: No    Review of Systems: See HPI, otherwise negative ROS  Physical Exam: BP (!) 171/76   Pulse (!) 49   Temp (!) 97.1 F (36.2 C) (Temporal)   Resp 18   Ht 5\' 7"  (1.702 m)   Wt 99.8 kg   SpO2 94%   BMI 34.46 kg/m  General:   Alert,  pleasant and cooperative in NAD Head:  Normocephalic and atraumatic. Respiratory:  Normal work of breathing. Cardiovascular:  RRR  Impression/Plan: Randy Kennedy is here for cataract surgery.  Risks, benefits, limitations, and alternatives regarding cataract surgery have been reviewed with the patient.  Questions have been answered.  All parties agreeable.   Birder Robson, MD  11/26/2020, 11:50 AM

## 2020-11-27 ENCOUNTER — Encounter: Payer: Self-pay | Admitting: Ophthalmology

## 2020-11-27 ENCOUNTER — Ambulatory Visit: Payer: PPO

## 2020-11-27 MED ORDER — HYDROCODONE-ACETAMINOPHEN 10-325 MG PO TABS: 1.0000 | ORAL_TABLET | Freq: Four times a day (QID) | ORAL | 0 refills | Status: DC | PRN

## 2020-11-27 NOTE — Addendum Note (Signed)
Addended by: [name] on: 11/27/2020 06:30 PM   Modules accepted: Orders

## 2020-11-27 NOTE — Telephone Encounter (Signed)
ERx 

## 2020-11-27 NOTE — Telephone Encounter (Signed)
Multiple E prescribing failures. Printed out and placed in Lisa's box, pt will need to come pick up.

## 2020-11-28 ENCOUNTER — Other Ambulatory Visit: Payer: Self-pay

## 2020-11-28 ENCOUNTER — Ambulatory Visit
Admission: RE | Admit: 2020-11-28 | Discharge: 2020-11-28 | Disposition: A | Discharge status: Home / Self Care | Payer: PPO | Source: Ambulatory Visit | Attending: Family Medicine | Admitting: Family Medicine

## 2020-11-28 DIAGNOSIS — N433 Hydrocele, unspecified: Secondary | ICD-10-CM | POA: Diagnosis not present

## 2020-11-28 DIAGNOSIS — N50812 Left testicular pain: Secondary | ICD-10-CM | POA: Diagnosis not present

## 2020-11-28 DIAGNOSIS — N503 Cyst of epididymis: Secondary | ICD-10-CM | POA: Diagnosis not present

## 2020-11-28 DIAGNOSIS — I861 Scrotal varices: Secondary | ICD-10-CM | POA: Diagnosis not present

## 2020-11-29 ENCOUNTER — Other Ambulatory Visit: Payer: Self-pay

## 2020-11-29 ENCOUNTER — Telehealth (INDEPENDENT_AMBULATORY_CARE_PROVIDER_SITE_OTHER): Payer: Self-pay | Admitting: Gastroenterology

## 2020-11-29 DIAGNOSIS — Z800 Family history of malignant neoplasm of digestive organs: Secondary | ICD-10-CM

## 2020-11-29 DIAGNOSIS — R195 Other fecal abnormalities: Secondary | ICD-10-CM

## 2020-11-29 MED ORDER — HYDROCODONE-ACETAMINOPHEN 10-325 MG PO TABS: 1.0000 | ORAL_TABLET | Freq: Four times a day (QID) | ORAL | 0 refills | Status: DC | PRN

## 2020-11-29 MED ORDER — NA SULFATE-K SULFATE-MG SULF 17.5-3.13-1.6 GM/177ML PO SOLN: 1.0000 | Freq: Once | ORAL | 0 refills | Status: AC

## 2020-11-29 NOTE — Progress Notes (Signed)
Gastroenterology Pre-Procedure Review  Request Date: Tuesday 12/17/20 Requesting Physician: Dr. Bonna Gains  PATIENT REVIEW QUESTIONS: The patient responded to the following health history questions as indicated:    1. Are you having any GI issues? no referral noted occult blood in stool 2. Do you have a personal history of Polyps? no 3. Do you have a family history of Colon Cancer or Polyps? yes (Mother colon cancer) 4. Diabetes Mellitus? no 5. Joint replacements in the past 12 months?no 6. Major health problems in the past 3 months?no 7. Any artificial heart valves, MVP, or defibrillator?no    MEDICATIONS & ALLERGIES:    Patient reports the following regarding taking any anticoagulation/antiplatelet therapy:   Plavix, Coumadin, Eliquis, Xarelto, Lovenox, Pradaxa, Brilinta, or Effient? no Aspirin? yes (81 mg daily)  Patient confirms/reports the following medications:  Current Outpatient Medications  Medication Sig Dispense Refill  . aspirin EC 81 MG tablet Take 81 mg by mouth daily.    . cetirizine (ZYRTEC) 10 MG tablet Take 10 mg by mouth at bedtime. PRN    . colchicine 0.6 MG tablet Take 1 tablet (0.6 mg total) by mouth daily. 90 tablet 1  . furosemide (LASIX) 20 MG tablet Take 1 tablet (20 mg total) by mouth 3 (three) times a week. (Patient taking differently: Take 20 mg by mouth 3 (three) times a week. M,W,F) 39 tablet 3  . gabapentin (NEURONTIN) 300 MG capsule Take 1 capsule (300 mg total) by mouth 2 (two) times daily AND 2 capsules (600 mg total) at bedtime. 360 capsule 3  . hydrALAZINE (APRESOLINE) 100 MG tablet TAKE 1 AND 1/2 TABLETS BY MOUTH THREE TIMES DAILY, MAY increase TO FOUR TIMES DAILY AS NEEDED FOR HIGH BLOOD PRESSURE 135 tablet 3  . HYDROcodone-acetaminophen (NORCO) 10-325 MG tablet Take 1 tablet by mouth every 6 (six) hours as needed. 90 tablet 0  . meloxicam (MOBIC) 15 MG tablet TAKE 1 TABLET BY MOUTH ONCE DAILY AS NEEDED FOR  GOUT  FLARE 30 tablet 0  . nitroGLYCERIN  (NITROSTAT) 0.4 MG SL tablet Place 1 tablet (0.4 mg total) under the tongue every 5 (five) minutes as needed for chest pain. 25 tablet 3  . pravastatin (PRAVACHOL) 40 MG tablet Take 1 tablet (40 mg total) by mouth daily. (Patient taking differently: Take 40 mg by mouth at bedtime.) 90 tablet 0  . predniSONE (DELTASONE) 20 MG tablet Take 1 tablet (20 mg total) by mouth daily. For 1-2 days prn gout flare 20 tablet 0  . probenecid (BENEMID) 500 MG tablet Take 1/2 (one-half) tablet by mouth twice daily (Patient taking differently: 2 (two) times daily. Take 1/2 (one-half) tablet by mouth twice daily/ breakfast and bedtime) 90 tablet 0  . valsartan (DIOVAN) 320 MG tablet Take 1 tablet (320 mg total) by mouth daily. 90 tablet 3  . vitamin C (ASCORBIC ACID) 500 MG tablet Take 500 mg by mouth daily.     No current facility-administered medications for this visit.    Patient confirms/reports the following allergies:  Allergies  Allergen Reactions  . Allopurinol Other (See Comments)    GI upset  . Lisinopril-Hydrochlorothiazide     Burning in feet.  . Procardia [Nifedipine] Other (See Comments)    Bradycardia, leg swelling  . Spironolactone Other (See Comments)    malaise, dizziness  . Amlodipine Rash    BLE  . Doxazosin Mesylate Rash  . Latex Rash  . Nortriptyline Rash  . Uloric [Febuxostat] Rash    No orders of the defined  types were placed in this encounter.   AUTHORIZATION INFORMATION Primary Insurance: 1D#: Group #:  Secondary Insurance: 1D#: Group #:  SCHEDULE INFORMATION: Date: 12/17/20 Time: Location:[facility]

## 2020-11-29 NOTE — Telephone Encounter (Signed)
[facility] does not accept electronically prescribed controlled substances.  I've sent this in to [facility].

## 2020-11-29 NOTE — Addendum Note (Signed)
Addended by: [name] on: 11/29/2020 07:20 AM   Modules accepted: Orders

## 2020-11-29 NOTE — Telephone Encounter (Signed)
Spoke with pt relaying Dr. G's message. Pt verbalizes understanding.  

## 2020-12-02 ENCOUNTER — Telehealth: Payer: Self-pay

## 2020-12-02 DIAGNOSIS — H2512 Age-related nuclear cataract, left eye: Secondary | ICD-10-CM | POA: Diagnosis not present

## 2020-12-02 NOTE — Telephone Encounter (Signed)
Error

## 2020-12-03 DIAGNOSIS — M5442 Lumbago with sciatica, left side: Secondary | ICD-10-CM | POA: Diagnosis not present

## 2020-12-03 DIAGNOSIS — M48062 Spinal stenosis, lumbar region with neurogenic claudication: Secondary | ICD-10-CM | POA: Diagnosis not present

## 2020-12-03 DIAGNOSIS — M9931 Osseous stenosis of neural canal of cervical region: Secondary | ICD-10-CM | POA: Diagnosis not present

## 2020-12-03 DIAGNOSIS — M5412 Radiculopathy, cervical region: Secondary | ICD-10-CM | POA: Diagnosis not present

## 2020-12-03 DIAGNOSIS — G8929 Other chronic pain: Secondary | ICD-10-CM | POA: Diagnosis not present

## 2020-12-03 DIAGNOSIS — M542 Cervicalgia: Secondary | ICD-10-CM | POA: Diagnosis not present

## 2020-12-09 NOTE — Discharge Instructions (Signed)

## 2020-12-10 ENCOUNTER — Encounter
Admission: RE | Disposition: A | Discharge status: Home / Self Care | Payer: Self-pay | Source: Home / Self Care | Attending: Ophthalmology

## 2020-12-10 ENCOUNTER — Ambulatory Visit: Payer: PPO | Admitting: Anesthesiology

## 2020-12-10 ENCOUNTER — Other Ambulatory Visit: Payer: Self-pay

## 2020-12-10 ENCOUNTER — Ambulatory Visit
Admission: RE | Admit: 2020-12-10 | Discharge: 2020-12-10 | Disposition: A | Discharge status: Home / Self Care | Payer: PPO | Attending: Ophthalmology | Admitting: Ophthalmology

## 2020-12-10 ENCOUNTER — Encounter: Payer: Self-pay | Admitting: Ophthalmology

## 2020-12-10 DIAGNOSIS — G4730 Sleep apnea, unspecified: Secondary | ICD-10-CM | POA: Insufficient documentation

## 2020-12-10 DIAGNOSIS — Z87891 Personal history of nicotine dependence: Secondary | ICD-10-CM | POA: Diagnosis not present

## 2020-12-10 DIAGNOSIS — Z881 Allergy status to other antibiotic agents status: Secondary | ICD-10-CM | POA: Insufficient documentation

## 2020-12-10 DIAGNOSIS — Z79899 Other long term (current) drug therapy: Secondary | ICD-10-CM | POA: Insufficient documentation

## 2020-12-10 DIAGNOSIS — Z7982 Long term (current) use of aspirin: Secondary | ICD-10-CM | POA: Diagnosis not present

## 2020-12-10 DIAGNOSIS — H2512 Age-related nuclear cataract, left eye: Secondary | ICD-10-CM | POA: Diagnosis not present

## 2020-12-10 DIAGNOSIS — Z888 Allergy status to other drugs, medicaments and biological substances status: Secondary | ICD-10-CM | POA: Insufficient documentation

## 2020-12-10 DIAGNOSIS — H25812 Combined forms of age-related cataract, left eye: Secondary | ICD-10-CM | POA: Diagnosis not present

## 2020-12-10 DIAGNOSIS — Z8616 Personal history of COVID-19: Secondary | ICD-10-CM | POA: Insufficient documentation

## 2020-12-10 HISTORY — PX: CATARACT EXTRACTION W/PHACO: SHX586

## 2020-12-10 SURGERY — PHACOEMULSIFICATION, CATARACT, WITH IOL INSERTION
Anesthesia: Monitor Anesthesia Care | Site: Eye | Laterality: Left

## 2020-12-10 MED ORDER — MIDAZOLAM HCL 2 MG/2ML IJ SOLN
INTRAMUSCULAR | Status: DC | PRN
  Administered 2020-12-10: 2 mg via INTRAVENOUS

## 2020-12-10 MED ORDER — TETRACAINE HCL 0.5 % OP SOLN
1.0000 [drp] | OPHTHALMIC | Status: DC | PRN
  Administered 2020-12-10 (×3): 1 [drp] via OPHTHALMIC

## 2020-12-10 MED ORDER — BRIMONIDINE TARTRATE-TIMOLOL 0.2-0.5 % OP SOLN
OPHTHALMIC | Status: DC | PRN
  Administered 2020-12-10: 1 [drp] via OPHTHALMIC

## 2020-12-10 MED ORDER — ARMC OPHTHALMIC DILATING DROPS
1.0000 "application " | OPHTHALMIC | Status: DC | PRN
  Administered 2020-12-10 (×3): 1 via OPHTHALMIC

## 2020-12-10 MED ORDER — FENTANYL CITRATE (PF) 100 MCG/2ML IJ SOLN
INTRAMUSCULAR | Status: DC | PRN
  Administered 2020-12-10: 100 ug via INTRAVENOUS

## 2020-12-10 MED ORDER — ACETAMINOPHEN 325 MG PO TABS: 325.0000 mg | ORAL_TABLET | ORAL | Status: DC | PRN

## 2020-12-10 MED ORDER — LACTATED RINGERS IV SOLN: INTRAVENOUS | Status: DC

## 2020-12-10 MED ORDER — NA CHONDROIT SULF-NA HYALURON 40-17 MG/ML IO SOLN
INTRAOCULAR | Status: DC | PRN
  Administered 2020-12-10: 1 mL via INTRAOCULAR

## 2020-12-10 MED ORDER — ACETAMINOPHEN 160 MG/5ML PO SOLN: 325.0000 mg | ORAL | Status: DC | PRN

## 2020-12-10 MED ORDER — LIDOCAINE HCL (PF) 2 % IJ SOLN
INTRAOCULAR | Status: DC | PRN
  Administered 2020-12-10: 1 mL via INTRAMUSCULAR

## 2020-12-10 MED ORDER — MOXIFLOXACIN HCL 0.5 % OP SOLN
OPHTHALMIC | Status: DC | PRN
  Administered 2020-12-10: 0.2 mL via OPHTHALMIC

## 2020-12-10 MED ORDER — EPINEPHRINE PF 1 MG/ML IJ SOLN
INTRAOCULAR | Status: DC | PRN
  Administered 2020-12-10: 76 mL via OPHTHALMIC

## 2020-12-10 SURGICAL SUPPLY — 19 items
CANNULA ANT/CHMB 27GA (MISCELLANEOUS) ×4 IMPLANT
GLOVE SKINSENSE NS SZ8.0 LF (GLOVE) ×1
GLOVE SKINSENSE STRL SZ8.0 LF (GLOVE) ×1 IMPLANT
GLOVE SURG TRIUMPH 8.0 PF LTX (GLOVE) ×2 IMPLANT
GOWN STRL REUS W/ TWL LRG LVL3 (GOWN DISPOSABLE) ×2 IMPLANT
GOWN STRL REUS W/TWL LRG LVL3 (GOWN DISPOSABLE) ×4
LENS IOL ACRSF IQ VT 15 25.5 ×1 IMPLANT
LENS IOL ACRYSOF VIVITY 25.5 ×2 IMPLANT
LENS IOL VIVITY 015 25.5 ×1 IMPLANT
MARKER SKIN DUAL TIP RULER LAB (MISCELLANEOUS) ×2 IMPLANT
NEEDLE FILTER BLUNT 18X 1/2SAF (NEEDLE) ×1
NEEDLE FILTER BLUNT 18X1 1/2 (NEEDLE) ×1 IMPLANT
PACK EYE AFTER SURG (MISCELLANEOUS) ×2 IMPLANT
PACK OPTHALMIC (MISCELLANEOUS) ×2 IMPLANT
PACK PORFILIO (MISCELLANEOUS) ×2 IMPLANT
SYR 3ML LL SCALE MARK (SYRINGE) ×2 IMPLANT
SYR TB 1ML LUER SLIP (SYRINGE) ×2 IMPLANT
WATER STERILE IRR 250ML POUR (IV SOLUTION) ×2 IMPLANT
WIPE NON LINTING 3.25X3.25 (MISCELLANEOUS) ×2 IMPLANT

## 2020-12-10 NOTE — Anesthesia Preprocedure Evaluation (Signed)
Anesthesia Evaluation  Patient identified by MRN, date of birth, ID band Patient awake    Reviewed: Allergy & Precautions, H&P , NPO status , Patient's Chart, lab work & pertinent test results, reviewed documented beta blocker date and time   Airway Mallampati: III  TM Distance: >3 FB Neck ROM: full    Dental no notable dental hx.    Pulmonary sleep apnea , former smoker,    Pulmonary exam normal breath sounds clear to auscultation       Cardiovascular Exercise Tolerance: Good hypertension, Normal cardiovascular exam Rhythm:regular Rate:Normal     Neuro/Psych negative neurological ROS  negative psych ROS   GI/Hepatic Neg liver ROS, GERD  ,  Endo/Other  negative endocrine ROS  Renal/GU negative Renal ROS  negative genitourinary   Musculoskeletal   Abdominal   Peds  Hematology negative hematology ROS (+)   Anesthesia Other Findings   Reproductive/Obstetrics negative OB ROS                             Anesthesia Physical Anesthesia Plan  ASA: III  Anesthesia Plan: MAC   Post-op Pain Management:    Induction:   PONV Risk Score and Plan:   Airway Management Planned:   Additional Equipment:   Intra-op Plan:   Post-operative Plan:   Informed Consent: I have reviewed the patients History and Physical, chart, labs and discussed the procedure including the risks, benefits and alternatives for the proposed anesthesia with the patient or authorized representative who has indicated his/her understanding and acceptance.     Dental Advisory Given  Plan Discussed with: CRNA and Anesthesiologist  Anesthesia Plan Comments:         Anesthesia Quick Evaluation

## 2020-12-10 NOTE — H&P (Signed)
[facility]   Primary Care Physician:  [name], MD Ophthalmologist: Dr. George Ina   Pre-Procedure History & Physical: HPI:  Randy Kennedy is a 70 y.o. male here for cataract surgery.   Past Medical History:  Diagnosis Date  . Allergic rhinitis   . Arthritis   . Chest pain    a. 11/2011 reportedly normal stress test performed in [place].  . Chronic pain    L ankle and bilateral hips (remote fracture s/p surgeries), lower back (told has herniated disc)  . Closed avulsion fracture of right talus 06/2016  . COVID-19 06/2020  . Eczema   . Erectile dysfunction 09/14/2012  . Gilbert disease   . Gout 2007  . Hearing loss    otosclerosis  . Heartburn    mild, controlled with pepcid  . History of chicken pox   . 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 HLD (hyperlipidemia)   . HTN (hypertension)    difficult to control - clonidine and beta blockers caused bradycardia  . Morbid obesity ([place])   . Motion sickness    Most moving vehicles  . 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 OSA (obstructive sleep apnea) 11/2011 sleep study   a. moderate, AHI 37.4 increased to 80 in REM, on BiPAP 14/10, 97% compliance >4 hrs (06/2013); b. Does not tolerate CPAP.  Marland Kitchen RLS (restless legs syndrome)   . 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 Treated with vicodin, prior tried fentanyl and butrans.  . Systolic murmur   . Wears dentures    Partial lower  . Wears hearing aid in both ears    Does not wear all the time    Past Surgical History:  Procedure Laterality Date  . CATARACT EXTRACTION W/PHACO Right 11/26/2020   Procedure: CATARACT EXTRACTION PHACO AND INTRAOCULAR LENS PLACEMENT (IOC) RIGHT VIVITY TORIC LENS 7.17 00:49.6;  Surgeon: Birder Robson, MD;  Location: [place] [facility];  Service: Ophthalmology;  Laterality: Right;  Latex Sleep Apnea  . COLONOSCOPY  02/2011   ext hem, benign polyp, rpt 5 yrs ([place])  . LEG SURGERY  x5   left - after fall at work (3.5 stories)  . MANDIBLE SURGERY  1965   jaw fracture - horse kick  . TYMPANIC MEMBRANE REPAIR Left 1994   otosclerosis    Prior to Admission medications   Medication Sig Start Date End Date Taking? Authorizing Provider  aspirin EC 81 MG tablet Take 81 mg by mouth daily.   Yes [provider]  cetirizine (ZYRTEC) 10 MG tablet Take 10 mg by mouth at bedtime. PRN   Yes [provider]  colchicine 0.6 MG tablet Take 1 tablet (0.6 mg total) by mouth daily. 10/17/20  Yes [name], MD  furosemide (LASIX) 20 MG tablet Take 1 tablet (20 mg total) by mouth 3 (three) times a week. Patient taking differently: Take 20 mg by mouth 3 (three) times a week. M,W,F 09/11/20 12/10/20 Yes Gollan, Kathlene November, MD  gabapentin (NEURONTIN) 300 MG capsule Take 1 capsule (300 mg total) by mouth 2 (two) times daily AND 2 capsules (600 mg total) at bedtime. 11/17/20  Yes [name], MD  hydrALAZINE (APRESOLINE) 100 MG tablet TAKE 1 AND 1/2 TABLETS BY MOUTH THREE TIMES DAILY, MAY  increase TO FOUR TIMES DAILY AS NEEDED FOR HIGH BLOOD PRESSURE 11/07/20  Yes [name], MD  HYDROcodone-acetaminophen [facility]) 10-325 MG tablet Take 1 tablet by mouth every 6 (six) hours as needed. 11/29/20  Yes [name], MD  meloxicam (MOBIC) 15 MG tablet TAKE 1 TABLET BY MOUTH ONCE DAILY AS NEEDED FOR  GOUT  FLARE 04/05/19  Yes [name], MD  pravastatin (PRAVACHOL) 40 MG tablet Take 1 tablet (40 mg total) by mouth daily. Patient taking differently: Take 40 mg by mouth at bedtime. 10/22/20  Yes [name], MD  predniSONE (DELTASONE) 20 MG tablet Take 1 tablet (20 mg total) by mouth daily. For 1-2 days prn gout flare 04/06/19  Yes [name], MD  probenecid (BENEMID) 500 MG tablet Take 1/2 (one-half)  tablet by mouth twice daily Patient taking differently: 2 (two) times daily. Take 1/2 (one-half) tablet by mouth twice daily/ breakfast and bedtime 10/22/20  Yes [name], MD  valsartan (DIOVAN) 320 MG tablet Take 1 tablet (320 mg total) by mouth daily. 10/28/20  Yes [name], MD  vitamin C (ASCORBIC ACID) 500 MG tablet Take 500 mg by mouth daily.   Yes [provider]  nitroGLYCERIN (NITROSTAT) 0.4 MG SL tablet Place 1 tablet (0.4 mg total) under the tongue every 5 (five) minutes as needed for chest pain. 02/16/13   Minna Merritts, MD    Allergies as of 10/24/2020 - Review Complete 10/01/2020  Allergen Reaction Noted  . Allopurinol Other (See Comments) 09/13/2012  . Lisinopril-hydrochlorothiazide  02/16/2013  . Procardia [nifedipine] Other (See Comments) 07/12/2013  . Spironolactone Other (See Comments) 05/28/2017  . Amlodipine Rash 08/01/2013  . Doxazosin mesylate Rash 10/07/2017  . Nortriptyline Rash 07/01/2018  . Uloric [febuxostat] Rash 05/07/2017    Family History  Problem Relation Age of Onset  . Cancer Father 50       prostate  . Cancer Mother        rectal  . Hypertension Mother   . Cancer Paternal Uncle        prostate  . Diabetes Neg Hx   . Stroke Neg Hx   . CAD Neg Hx     Social History   Socioeconomic History  . Marital status: Married    Spouse name: Not on file  . Number of children: Not on file  . Years of education: Not on file  . Highest education level: Not on file  Occupational History  . Not on file  Tobacco Use  . Smoking status: Former Smoker    Packs/day: 1.00    Years: 4.00    Pack years: 4.00    Types: Pipe    Quit date: 07/27/1978    Years since quitting: 42.4  . Smokeless tobacco: Never Used  Vaping Use  . Vaping Use: Never used  Substance and Sexual Activity  . Alcohol use: No    Alcohol/week: 0.0 standard drinks  . Drug use: No  . Sexual activity: Not on file  Other Topics Concern  . Not on file  Social  History Narrative   Caffeine: 2 cans coke/day   Lives with wife and grown son    Occupation: retired, was Agricultural engineer.   On disability for chronic pain   Edu: MBA   Activity: no regular exercise   Deit: good water, fruits/vegetables daily   Social Determinants of Health   Financial Resource Strain: Low Risk   . Difficulty of Paying Living Expenses: Not hard at all  Food  Insecurity: No Food Insecurity  . Worried About Charity fundraiser in the Last Year: Never true  . Ran Out of Food in the Last Year: Never true  Transportation Needs: No Transportation Needs  . Lack of Transportation (Medical): No  . Lack of Transportation (Non-Medical): No  Physical Activity: Inactive  . Days of Exercise per Week: 0 days  . Minutes of Exercise per Session: 0 min  Stress: No Stress Concern Present  . Feeling of Stress : Not at all  Social Connections: Not on file  Intimate Partner Violence: Not At Risk  . Fear of Current or Ex-Partner: No  . Emotionally Abused: No  . Physically Abused: No  . Sexually Abused: No    Review of Systems: See HPI, otherwise negative ROS  Physical Exam: BP (!) 155/75   Pulse (!) 43   Temp 97.6 F (36.4 C) (Temporal)   Wt 102.1 kg   SpO2 95%   BMI 35.24 kg/m  General:   Alert,  pleasant and cooperative in NAD Head:  Normocephalic and atraumatic. Respiratory:  Normal work of breathing. Cardiovascular:  RRR  Impression/Plan: Randy Kennedy is here for cataract surgery.  Risks, benefits, limitations, and alternatives regarding cataract surgery have been reviewed with the patient.  Questions have been answered.  All parties agreeable.   Birder Robson, MD  12/10/2020, 12:02 PM

## 2020-12-10 NOTE — Op Note (Signed)
PREOPERATIVE DIAGNOSIS:  Nuclear sclerotic cataract of the left eye.   POSTOPERATIVE DIAGNOSIS:  Nuclear sclerotic cataract of the left eye.   OPERATIVE PROCEDURE:@   SURGEON:  Birder Robson, MD.   ANESTHESIA:  Anesthesiologist: Rochel Brome, MD CRNA: Silvana Newness, CRNA  1.      Managed anesthesia care. 2.     0.55ml of Shugarcaine was instilled following the paracentesis   COMPLICATIONS:  None.   TECHNIQUE:   Stop and chop   DESCRIPTION OF PROCEDURE:  The patient was examined and consented in the preoperative holding area where the aforementioned topical anesthesia was applied to the left eye and then brought back to the Operating Room where the left eye was prepped and draped in the usual sterile ophthalmic fashion and a lid speculum was placed. A paracentesis was created with the side port blade and the anterior chamber was filled with viscoelastic. A near clear corneal incision was performed with the steel keratome. A continuous curvilinear capsulorrhexis was performed with a cystotome followed by the capsulorrhexis forceps. Hydrodissection and hydrodelineation were carried out with BSS on a blunt cannula. The lens was removed in a stop and chop  technique and the remaining cortical material was removed with the irrigation-aspiration handpiece. The capsular bag was inflated with viscoelastic and the Technis ZCB00 lens was placed in the capsular bag without complication. The remaining viscoelastic was removed from the eye with the irrigation-aspiration handpiece. The wounds were hydrated. The anterior chamber was flushed with BSS and the eye was inflated to physiologic pressure. 0.48ml Vigamox was placed in the anterior chamber. The wounds were found to be water tight. The eye was dressed with Combigan. The patient was given protective glasses to wear throughout the day and a shield with which to sleep tonight. The patient was also given drops with which to begin a drop regimen today and  will follow-up with me in one day. Implant Name Type Inv. Item Serial No. Manufacturer Lot No. LRB No. Used Action  LENS IOL ACRYSOF VIVITY 25.5 - [id number]  LENS IOL ACRYSOF VIVITY 25.5 [id number] ALCON  Left 1 Implanted    Procedure(s) with comments: CATARACT EXTRACTION PHACO AND INTRAOCULAR LENS PLACEMENT (IOC) LEFT VIVITY (Left) - Latex Sleep apnea 5.37 00:32.7  Electronically signed: Birder Robson 12/10/2020 12:27 PM

## 2020-12-10 NOTE — Transfer of Care (Signed)
Immediate Anesthesia Transfer of Care Note  Patient: Randy Kennedy  Procedure(s) Performed: CATARACT EXTRACTION PHACO AND INTRAOCULAR LENS PLACEMENT (IOC) LEFT VIVITY (Left Eye)  Patient Location: PACU  Anesthesia Type: MAC  Level of Consciousness: awake, alert  and patient cooperative  Airway and Oxygen Therapy: Patient Spontanous Breathing and Patient connected to supplemental oxygen  Post-op Assessment: Post-op Vital signs reviewed, Patient's Cardiovascular Status Stable, Respiratory Function Stable, Patent Airway and No signs of Nausea or vomiting  Post-op Vital Signs: Reviewed and stable  Complications: No complications documented.

## 2020-12-10 NOTE — Anesthesia Postprocedure Evaluation (Signed)
Anesthesia Post Note  Patient: Randy Kennedy  Procedure(s) Performed: CATARACT EXTRACTION PHACO AND INTRAOCULAR LENS PLACEMENT (IOC) LEFT VIVITY (Left Eye)     Patient location during evaluation: PACU Anesthesia Type: MAC Level of consciousness: awake and alert Pain management: pain level controlled Vital Signs Assessment: post-procedure vital signs reviewed and stable Respiratory status: spontaneous breathing, nonlabored ventilation, respiratory function stable and patient connected to nasal cannula oxygen Cardiovascular status: stable and blood pressure returned to baseline Postop Assessment: no apparent nausea or vomiting Anesthetic complications: no   No complications documented.  Trecia Rogers

## 2020-12-10 NOTE — Anesthesia Procedure Notes (Signed)
Procedure Name: MAC Date/Time: 12/10/2020 12:12 PM Performed by: Silvana Newness, CRNA Pre-anesthesia Checklist: Patient identified, Emergency Drugs available, Suction available, Patient being monitored and Timeout performed Patient Re-evaluated:Patient Re-evaluated prior to induction Oxygen Delivery Method: Nasal cannula Placement Confirmation: positive ETCO2

## 2020-12-11 ENCOUNTER — Encounter: Payer: Self-pay | Admitting: Ophthalmology

## 2020-12-16 ENCOUNTER — Encounter: Payer: Self-pay | Admitting: Gastroenterology

## 2020-12-17 ENCOUNTER — Encounter: Payer: Self-pay | Admitting: Gastroenterology

## 2020-12-17 ENCOUNTER — Ambulatory Visit
Admission: RE | Admit: 2020-12-17 | Discharge: 2020-12-17 | Disposition: A | Discharge status: Home / Self Care | Payer: PPO | Source: Ambulatory Visit | Attending: Gastroenterology | Admitting: Gastroenterology

## 2020-12-17 ENCOUNTER — Ambulatory Visit: Payer: PPO | Admitting: Registered Nurse

## 2020-12-17 ENCOUNTER — Encounter
Admission: RE | Disposition: A | Discharge status: Home / Self Care | Payer: Self-pay | Source: Ambulatory Visit | Attending: Gastroenterology

## 2020-12-17 DIAGNOSIS — E785 Hyperlipidemia, unspecified: Secondary | ICD-10-CM | POA: Insufficient documentation

## 2020-12-17 DIAGNOSIS — G4733 Obstructive sleep apnea (adult) (pediatric): Secondary | ICD-10-CM | POA: Insufficient documentation

## 2020-12-17 DIAGNOSIS — D123 Benign neoplasm of transverse colon: Secondary | ICD-10-CM | POA: Diagnosis not present

## 2020-12-17 DIAGNOSIS — Z8042 Family history of malignant neoplasm of prostate: Secondary | ICD-10-CM | POA: Insufficient documentation

## 2020-12-17 DIAGNOSIS — Z91040 Latex allergy status: Secondary | ICD-10-CM | POA: Insufficient documentation

## 2020-12-17 DIAGNOSIS — I10 Essential (primary) hypertension: Secondary | ICD-10-CM | POA: Insufficient documentation

## 2020-12-17 DIAGNOSIS — D122 Benign neoplasm of ascending colon: Secondary | ICD-10-CM | POA: Diagnosis not present

## 2020-12-17 DIAGNOSIS — D120 Benign neoplasm of cecum: Secondary | ICD-10-CM | POA: Diagnosis not present

## 2020-12-17 DIAGNOSIS — Z1211 Encounter for screening for malignant neoplasm of colon: Secondary | ICD-10-CM | POA: Diagnosis not present

## 2020-12-17 DIAGNOSIS — Z888 Allergy status to other drugs, medicaments and biological substances status: Secondary | ICD-10-CM | POA: Insufficient documentation

## 2020-12-17 DIAGNOSIS — K644 Residual hemorrhoidal skin tags: Secondary | ICD-10-CM | POA: Insufficient documentation

## 2020-12-17 DIAGNOSIS — Z7982 Long term (current) use of aspirin: Secondary | ICD-10-CM | POA: Diagnosis not present

## 2020-12-17 DIAGNOSIS — Z8619 Personal history of other infectious and parasitic diseases: Secondary | ICD-10-CM | POA: Insufficient documentation

## 2020-12-17 DIAGNOSIS — K635 Polyp of colon: Secondary | ICD-10-CM | POA: Diagnosis not present

## 2020-12-17 DIAGNOSIS — Z6833 Body mass index (BMI) 33.0-33.9, adult: Secondary | ICD-10-CM | POA: Diagnosis not present

## 2020-12-17 DIAGNOSIS — G47419 Narcolepsy without cataplexy: Secondary | ICD-10-CM | POA: Diagnosis not present

## 2020-12-17 DIAGNOSIS — R195 Other fecal abnormalities: Secondary | ICD-10-CM | POA: Diagnosis not present

## 2020-12-17 DIAGNOSIS — Z79899 Other long term (current) drug therapy: Secondary | ICD-10-CM | POA: Insufficient documentation

## 2020-12-17 DIAGNOSIS — Z800 Family history of malignant neoplasm of digestive organs: Secondary | ICD-10-CM | POA: Diagnosis not present

## 2020-12-17 DIAGNOSIS — Z7689 Persons encountering health services in other specified circumstances: Secondary | ICD-10-CM | POA: Diagnosis not present

## 2020-12-17 DIAGNOSIS — Z8616 Personal history of COVID-19: Secondary | ICD-10-CM | POA: Insufficient documentation

## 2020-12-17 DIAGNOSIS — H9190 Unspecified hearing loss, unspecified ear: Secondary | ICD-10-CM | POA: Insufficient documentation

## 2020-12-17 DIAGNOSIS — Z87891 Personal history of nicotine dependence: Secondary | ICD-10-CM | POA: Diagnosis not present

## 2020-12-17 DIAGNOSIS — Z8249 Family history of ischemic heart disease and other diseases of the circulatory system: Secondary | ICD-10-CM | POA: Diagnosis not present

## 2020-12-17 HISTORY — PX: COLONOSCOPY WITH PROPOFOL: SHX5780

## 2020-12-17 HISTORY — DX: Dyspnea, unspecified: R06.00

## 2020-12-17 SURGERY — COLONOSCOPY WITH PROPOFOL
Anesthesia: General

## 2020-12-17 MED ORDER — GLYCOPYRROLATE 0.2 MG/ML IJ SOLN
INTRAMUSCULAR | Status: AC
  Filled 2020-12-17: qty 1

## 2020-12-17 MED ORDER — PROPOFOL 500 MG/50ML IV EMUL
INTRAVENOUS | Status: DC | PRN
  Administered 2020-12-17: 150 ug/kg/min via INTRAVENOUS

## 2020-12-17 MED ORDER — LIDOCAINE HCL (CARDIAC) PF 100 MG/5ML IV SOSY
PREFILLED_SYRINGE | INTRAVENOUS | Status: DC | PRN
  Administered 2020-12-17: 100 mg via INTRAVENOUS

## 2020-12-17 MED ORDER — SODIUM CHLORIDE 0.9 % IV SOLN
INTRAVENOUS | Status: DC
Start: 2020-12-17 — End: 2020-12-17

## 2020-12-17 MED ORDER — PROPOFOL 10 MG/ML IV BOLUS
INTRAVENOUS | Status: DC | PRN
  Administered 2020-12-17: 80 mg via INTRAVENOUS

## 2020-12-17 NOTE — Anesthesia Preprocedure Evaluation (Signed)
Anesthesia Evaluation  Patient identified by MRN, date of birth, ID band Patient awake    Reviewed: Allergy & Precautions, H&P , NPO status , Patient's Chart, lab work & pertinent test results, reviewed documented beta blocker date and time   Airway Mallampati: III  TM Distance: >3 FB Neck ROM: full    Dental no notable dental hx.    Pulmonary shortness of breath, sleep apnea , former smoker,    Pulmonary exam normal breath sounds clear to auscultation       Cardiovascular Exercise Tolerance: Good hypertension, Normal cardiovascular exam+ Valvular Problems/Murmurs MR  Rhythm:regular Rate:Normal     Neuro/Psych  Neuromuscular disease negative psych ROS   GI/Hepatic Neg liver ROS, GERD  Controlled,  Endo/Other  negative endocrine ROS  Renal/GU negative Renal ROS  negative genitourinary   Musculoskeletal  (+) Arthritis , Osteoarthritis,    Abdominal   Peds  Hematology negative hematology ROS (+)   Anesthesia Other Findings . Chronic pain syndrome  . Gout, joint  . Hepatitis  . History of ankle fracture  . HTN (hypertension)  . Hypertension  . Myalgia  . RLS (restless legs syndrome)  . RSD lower limb  . Severe obesity (BMI 35.0-35.9 with comorbidity) (CMS-HCC)  . Sleep apnea    Reproductive/Obstetrics negative OB ROS                            Anesthesia Physical  Anesthesia Plan  ASA: III  Anesthesia Plan: General   Post-op Pain Management:    Induction: Intravenous  PONV Risk Score and Plan: 2 and Propofol infusion and TIVA  Airway Management Planned: Natural Airway and Nasal Cannula  Additional Equipment:   Intra-op Plan:   Post-operative Plan:   Informed Consent: I have reviewed the patients History and Physical, chart, labs and discussed the procedure including the risks, benefits and alternatives for the proposed anesthesia with the patient or authorized  representative who has indicated his/her understanding and acceptance.     Dental Advisory Given  Plan Discussed with: CRNA, Anesthesiologist and Surgeon  Anesthesia Plan Comments:         Anesthesia Quick Evaluation

## 2020-12-17 NOTE — Transfer of Care (Signed)
Immediate Anesthesia Transfer of Care Note  Patient: Randy Kennedy  Procedure(s) Performed: COLONOSCOPY WITH PROPOFOL (N/A )  Patient Location: Endoscopy Unit  Anesthesia Type:General  Level of Consciousness: drowsy  Airway & Oxygen Therapy: Patient Spontanous Breathing  Post-op Assessment: Report given to RN and Post -op Vital signs reviewed and stable  Post vital signs: Reviewed and stable  Last Vitals:  Vitals Value Taken Time  BP    Temp    Pulse 61 12/17/20 0954  Resp 14 12/17/20 0954  SpO2 97 % 12/17/20 0954  Vitals shown include unvalidated device data.  Last Pain:  Vitals:   12/17/20 0839  TempSrc: Temporal  PainSc: 6          Complications: No complications documented.

## 2020-12-17 NOTE — H&P (Signed)
Cephas Darby, MD [address]  [place]  [place], [place] [address]  Main: [phone]  Fax: [phone] Pager: [phone]  Primary Care Physician:  [name], MD Primary Gastroenterologist:  Dr. Cephas Darby  Pre-Procedure History & Physical: HPI:  Randy Kennedy is a 70 y.o. male is here for an colonoscopy.   Past Medical History:  Diagnosis Date  . Allergic rhinitis   . Arthritis   . Chest pain    a. 11/2011 reportedly normal stress test performed in [place].  . Chronic pain    L ankle and bilateral hips (remote fracture s/p surgeries), lower back (told has herniated disc)  . Closed avulsion fracture of right talus 06/2016  . COVID-19 06/2020  . Dyspnea   . Eczema   . Erectile dysfunction 09/14/2012  . Gilbert disease   . Gout 2007  . Hearing loss    otosclerosis  . Heartburn    mild, controlled with pepcid  . History of chicken pox   . 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 HLD (hyperlipidemia)   . HTN (hypertension)    difficult to control - clonidine and beta blockers caused bradycardia  . Morbid obesity ([place])   . Motion sickness    Most moving vehicles  . 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 OSA (obstructive sleep apnea) 11/2011 sleep study   a. moderate, AHI 37.4 increased to 80 in REM, on BiPAP 14/10, 97% compliance >4 hrs (06/2013); b. Does not tolerate CPAP.  Marland Kitchen RLS (restless legs syndrome)   . 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 Treated with vicodin, prior tried fentanyl and butrans.  . Systolic murmur   . Wears dentures    Partial lower  . Wears hearing aid in both ears    Does not wear all the time    Past Surgical History:  Procedure Laterality Date  . CATARACT EXTRACTION W/PHACO Right 11/26/2020   Procedure: CATARACT  EXTRACTION PHACO AND INTRAOCULAR LENS PLACEMENT (IOC) RIGHT VIVITY TORIC LENS 7.17 00:49.6;  Surgeon: Birder Robson, MD;  Location: [facility];  Service: Ophthalmology;  Laterality: Right;  Latex Sleep Apnea  . CATARACT EXTRACTION W/PHACO Left 12/10/2020   Procedure: CATARACT EXTRACTION PHACO AND INTRAOCULAR LENS PLACEMENT ([place]) LEFT VIVITY;  Surgeon: Birder Robson, MD;  Location: [facility];  Service: Ophthalmology;  Laterality: Left;  Latex Sleep apnea 5.37 00:32.7  . COLONOSCOPY  02/2011   ext hem, benign polyp, rpt 5 yrs ([place])  . LEG SURGERY  x5   left - after fall at work (3.5 stories)  . MANDIBLE SURGERY  1965   jaw fracture - horse kick  . TYMPANIC MEMBRANE REPAIR Left 1994   otosclerosis    Prior to Admission medications   Medication Sig Start Date End Date Taking? Authorizing Provider  aspirin EC 81 MG tablet Take 81 mg by mouth daily.    [provider]  cetirizine (ZYRTEC) 10 MG tablet Take 10 mg by mouth at bedtime. PRN    [provider]  colchicine 0.6 MG tablet Take 1 tablet (0.6 mg total) by mouth daily. 10/17/20   [name], MD  furosemide (LASIX) 20 MG tablet Take 1 tablet (20 mg total) by mouth 3 (three) times a week. Patient  taking differently: Take 20 mg by mouth 3 (three) times a week. M,W,F 09/11/20 12/10/20  Minna Merritts, MD  gabapentin (NEURONTIN) 300 MG capsule Take 1 capsule (300 mg total) by mouth 2 (two) times daily AND 2 capsules (600 mg total) at bedtime. 11/17/20   [name], MD  hydrALAZINE (APRESOLINE) 100 MG tablet TAKE 1 AND 1/2 TABLETS BY MOUTH THREE TIMES DAILY, MAY increase TO FOUR TIMES DAILY AS NEEDED FOR HIGH BLOOD PRESSURE 11/07/20   [name], MD  HYDROcodone-acetaminophen [facility]) 10-325 MG tablet Take 1 tablet by mouth every 6 (six) hours as needed. 11/29/20   [name], MD  meloxicam (MOBIC) 15 MG tablet TAKE 1 TABLET BY MOUTH ONCE DAILY AS NEEDED FOR  GOUT  FLARE 04/05/19    [name], MD  nitroGLYCERIN (NITROSTAT) 0.4 MG SL tablet Place 1 tablet (0.4 mg total) under the tongue every 5 (five) minutes as needed for chest pain. 02/16/13   Minna Merritts, MD  pravastatin (PRAVACHOL) 40 MG tablet Take 1 tablet (40 mg total) by mouth daily. Patient taking differently: Take 40 mg by mouth at bedtime. 10/22/20   [name], MD  predniSONE (DELTASONE) 20 MG tablet Take 1 tablet (20 mg total) by mouth daily. For 1-2 days prn gout flare 04/06/19   [name], MD  probenecid (BENEMID) 500 MG tablet Take 1/2 (one-half) tablet by mouth twice daily Patient taking differently: 2 (two) times daily. Take 1/2 (one-half) tablet by mouth twice daily/ breakfast and bedtime 10/22/20   [name], MD  valsartan (DIOVAN) 320 MG tablet Take 1 tablet (320 mg total) by mouth daily. 10/28/20   [name], MD  vitamin C (ASCORBIC ACID) 500 MG tablet Take 500 mg by mouth daily.    [provider]    Allergies as of 11/29/2020 - Review Complete 11/29/2020  Allergen Reaction Noted  . Allopurinol Other (See Comments) 09/13/2012  . Lisinopril-hydrochlorothiazide  02/16/2013  . Procardia [nifedipine] Other (See Comments) 07/12/2013  . Spironolactone Other (See Comments) 05/28/2017  . Amlodipine Rash 08/01/2013  . Doxazosin mesylate Rash 10/07/2017  . Latex Rash 11/14/2020  . Nortriptyline Rash 07/01/2018  . Uloric [febuxostat] Rash 05/07/2017    Family History  Problem Relation Age of Onset  . Cancer Father 28       prostate  . Cancer Mother        rectal  . Hypertension Mother   . Cancer Paternal Uncle        prostate  . Diabetes Neg Hx   . Stroke Neg Hx   . CAD Neg Hx     Social History   Socioeconomic History  . Marital status: Married    Spouse name: Not on file  . Number of children: Not on file  . Years of education: Not on file  . Highest education level: Not on file  Occupational History  . Not on file  Tobacco Use  .  Smoking status: Former Smoker    Packs/day: 1.00    Years: 4.00    Pack years: 4.00    Types: Pipe    Quit date: 07/27/1978    Years since quitting: 42.4  . Smokeless tobacco: Never Used  Vaping Use  . Vaping Use: Never used  Substance and Sexual Activity  . Alcohol use: No    Alcohol/week: 0.0 standard drinks  . Drug use: No  . Sexual activity: Not on file  Other Topics Concern  . Not on file  Social History Narrative  Caffeine: 2 cans coke/day   Lives with wife and grown son    Occupation: retired, was Agricultural engineer.   On disability for chronic pain   Edu: MBA   Activity: no regular exercise   Deit: good water, fruits/vegetables daily   Social Determinants of Health   Financial Resource Strain: Low Risk   . Difficulty of Paying Living Expenses: Not hard at all  Food Insecurity: No Food Insecurity  . Worried About Charity fundraiser in the Last Year: Never true  . Ran Out of Food in the Last Year: Never true  Transportation Needs: No Transportation Needs  . Lack of Transportation (Medical): No  . Lack of Transportation (Non-Medical): No  Physical Activity: Inactive  . Days of Exercise per Week: 0 days  . Minutes of Exercise per Session: 0 min  Stress: No Stress Concern Present  . Feeling of Stress : Not at all  Social Connections: Not on file  Intimate Partner Violence: Not At Risk  . Fear of Current or Ex-Partner: No  . Emotionally Abused: No  . Physically Abused: No  . Sexually Abused: No    Review of Systems: See HPI, otherwise negative ROS  Physical Exam: BP (!) 175/80   Pulse (!) 45   Temp (!) 96.9 F (36.1 C) (Temporal)   Resp 18   Ht 5\' 7"  (1.702 m)   Wt 97.5 kg   SpO2 98%   BMI 33.67 kg/m  General:   Alert,  pleasant and cooperative in NAD Head:  Normocephalic and atraumatic. Neck:  Supple; no masses or thyromegaly. Lungs:  Clear throughout to auscultation.    Heart:  Regular rate and rhythm. Abdomen:  Soft, nontender and  nondistended. Normal bowel sounds, without guarding, and without rebound.   Neurologic:  Alert and  oriented x4;  grossly normal neurologically.  Impression/Plan: Randy Kennedy is here for an colonoscopy to be performed for colon cancer screening  Risks, benefits, limitations, and alternatives regarding  colonoscopy have been reviewed with the patient.  Questions have been answered.  All parties agreeable.   Sherri Sear, MD  12/17/2020, 9:25 AM

## 2020-12-17 NOTE — Op Note (Signed)
[facility] Gastroenterology Patient Name: Randy Kennedy Procedure Date: 12/17/2020 9:11 AM MRN: [id number] Account #: [id number] Date of Birth: 1951/02/04 Admit Type: Outpatient Age: 69 Room: [facility] ENDO ROOM 3 Gender: Male Note Status: Finalized Procedure:             Colonoscopy Indications:           Screening in patient at increased risk: Colorectal                         cancer in mother before age 18, Last colonoscopy 10                         years ago Providers:             Lin Landsman MD, MD Medicines:             General Anesthesia Complications:         No immediate complications. Estimated blood loss: None. Procedure:             Pre-Anesthesia Assessment:                        - Prior to the procedure, a History and Physical was                         performed, and patient medications and allergies were                         reviewed. The patient is competent. The risks and                         benefits of the procedure and the sedation options and                         risks were discussed with the patient. All questions                         were answered and informed consent was obtained.                         Patient identification and proposed procedure were                         verified by the physician, the nurse, the                         anesthesiologist, the anesthetist and the technician                         in the pre-procedure area in the procedure room in the                         endoscopy suite. Mental Status Examination: alert and                         oriented. Airway Examination: normal oropharyngeal                         airway and neck mobility. Respiratory Examination:  clear to auscultation. CV Examination: normal.                         Prophylactic Antibiotics: The patient does not require                         prophylactic antibiotics. Prior Anticoagulants: The                          patient has taken no previous anticoagulant or                         antiplatelet agents. ASA Grade Assessment: III - A                         patient with severe systemic disease. After reviewing                         the risks and benefits, the patient was deemed in                         satisfactory condition to undergo the procedure. The                         anesthesia plan was to use general anesthesia.                         Immediately prior to administration of medications,                         the patient was re-assessed for adequacy to receive                         sedatives. The heart rate, respiratory rate, oxygen                         saturations, blood pressure, adequacy of pulmonary                         ventilation, and response to care were monitored                         throughout the procedure. The physical status of the                         patient was re-assessed after the procedure.                        After obtaining informed consent, the colonoscope was                         passed under direct vision. Throughout the procedure,                         the patient's blood pressure, pulse, and oxygen                         saturations were monitored continuously. The  Colonoscope was introduced through the anus and                         advanced to the the cecum, identified by appendiceal                         orifice and ileocecal valve. The colonoscopy was                         performed without difficulty. The patient tolerated                         the procedure well. The quality of the bowel                         preparation was evaluated using the BBPS [facility] Bowel                         Preparation Scale) with scores of: Right Colon = 3,                         Transverse Colon = 3 and Left Colon = 3 (entire mucosa                         seen well with no residual  staining, small fragments                         of stool or opaque liquid). The total BBPS score                         equals 9. Findings:      The perianal and digital rectal examinations were normal. Pertinent       negatives include normal sphincter tone and no palpable rectal lesions.      Five sessile polyps were found in the transverse colon, ascending colon       and cecum. The polyps were 3 to 6 mm in size. These polyps were removed       with a cold snare. Resection and retrieval were complete.      Non-bleeding external hemorrhoids were found during retroflexion and       during perianal exam. The hemorrhoids were medium-sized. Impression:            - Five 3 to 6 mm polyps in the transverse colon, in                         the ascending colon and in the cecum, removed with a                         cold snare. Resected and retrieved.                        - Non-bleeding external hemorrhoids. Recommendation:        - Discharge patient to home (with escort).                        - Resume previous diet today.                        -  Continue present medications.                        - Await pathology results.                        - Repeat colonoscopy in 3 years for surveillance of                         multiple polyps. Procedure Code(s):     --- Professional ---                        [phone], Colonoscopy, flexible; with removal of                         tumor(s), polyp(s), or other lesion(s) by snare                         technique Diagnosis Code(s):     --- Professional ---                        Z80.0, Family history of malignant neoplasm of                         digestive organs                        K63.5, Polyp of colon                        K64.4, Residual hemorrhoidal skin tags CPT copyright 2019 American Medical Association. All rights reserved. The codes documented in this report are preliminary and upon coder review may  be revised to meet  current compliance requirements. Dr. Ulyess Mort Lin Landsman MD, MD 12/17/2020 9:51:12 AM This report has been signed electronically. Number of Addenda: 0 Note Initiated On: 12/17/2020 9:11 AM Scope Withdrawal Time: 0 hours 12 minutes 48 seconds  Total Procedure Duration: 0 hours 18 minutes 45 seconds  Estimated Blood Loss:  Estimated blood loss: none.      [facility]

## 2020-12-17 NOTE — Anesthesia Postprocedure Evaluation (Signed)
Anesthesia Post Note  Patient: Randy Kennedy  Procedure(s) Performed: COLONOSCOPY WITH PROPOFOL (N/A )  Patient location during evaluation: Phase II Anesthesia Type: General Level of consciousness: awake and alert, awake and oriented Pain management: pain level controlled Vital Signs Assessment: post-procedure vital signs reviewed and stable Respiratory status: spontaneous breathing, nonlabored ventilation and respiratory function stable Cardiovascular status: blood pressure returned to baseline and stable Postop Assessment: no apparent nausea or vomiting Anesthetic complications: no   No complications documented.   Last Vitals:  Vitals:   12/17/20 1004 12/17/20 1005  BP: 133/75 (!) 141/76  Pulse: (!) 46 (!) 44  Resp:    Temp:    SpO2: 97% 100%    Last Pain:  Vitals:   12/17/20 1005  TempSrc:   PainSc: 0-No pain                 Phill Mutter

## 2020-12-18 ENCOUNTER — Encounter: Payer: Self-pay | Admitting: Gastroenterology

## 2020-12-18 LAB — SURGICAL PATHOLOGY

## 2020-12-21 ENCOUNTER — Encounter: Payer: Self-pay | Admitting: Family Medicine

## 2021-01-01 ENCOUNTER — Ambulatory Visit: Payer: PPO | Admitting: Gastroenterology

## 2021-01-04 ENCOUNTER — Other Ambulatory Visit: Payer: Self-pay | Admitting: Family Medicine

## 2021-01-06 NOTE — Telephone Encounter (Signed)
Name of Medication: Hydrocodone-APAP Name of Pharmacy: [facility] Last Fill or Written Date and Quantity: 11/29/20, #90 Last Office Visit and Type: 10/28/20, AWV prt 2 Next Office Visit and Type: none Last Controlled Substance Agreement Date: 09/29/16 Last UDS: 03/10/19

## 2021-01-08 MED ORDER — HYDROCODONE-ACETAMINOPHEN 10-325 MG PO TABS: 1.0000 | ORAL_TABLET | Freq: Four times a day (QID) | ORAL | 0 refills | Status: DC | PRN

## 2021-01-08 NOTE — Telephone Encounter (Signed)
ERx 

## 2021-01-13 ENCOUNTER — Other Ambulatory Visit: Payer: Self-pay | Admitting: Family Medicine

## 2021-01-14 ENCOUNTER — Telehealth: Payer: Self-pay

## 2021-01-14 NOTE — Chronic Care Management (AMB) (Addendum)
Chronic Care Management Pharmacy Assistant   Name: Randy Kennedy  MRN: [id number] DOB: Jun 13, 1951  Reason for Encounter: Medication Adherence and Delivery Coordination    Recent office visits:  None since last CCM contact  Recent consult visits:  12/17/20 - Gastroenterology - Colonoscopy 12/10/20 - Ophthalmology - No medication changes 12/03/20 - [facility] - Steroid injection cervical spine, lumbar spine, PT ordered 11/29/20 - Gastroenterology, telemedicine - No medication changes 11/26/20 - Ophthalmology - No medication changes  11/15/20 - [facility] - Steroid injection lumbar spine  Hospital visits:  None in previous 6 months  Medications: Outpatient Encounter Medications as of 01/14/2021  Medication Sig   HYDROcodone-acetaminophen (NORCO) 10-325 MG tablet Take 1 tablet by mouth every 6 (six) hours as needed.   aspirin EC 81 MG tablet Take 81 mg by mouth daily.   cetirizine (ZYRTEC) 10 MG tablet Take 10 mg by mouth at bedtime. PRN   colchicine 0.6 MG tablet Take 1 tablet (0.6 mg total) by mouth daily.   furosemide (LASIX) 20 MG tablet Take 1 tablet (20 mg total) by mouth 3 (three) times a week. (Patient taking differently: Take 20 mg by mouth 3 (three) times a week. M,W,F)   gabapentin (NEURONTIN) 300 MG capsule Take 1 capsule (300 mg total) by mouth 2 (two) times daily AND 2 capsules (600 mg total) at bedtime.   hydrALAZINE (APRESOLINE) 100 MG tablet TAKE 1 AND 1/2 TABLETS BY MOUTH THREE TIMES DAILY, MAY increase TO FOUR TIMES DAILY AS NEEDED FOR HIGH BLOOD PRESSURE   meloxicam (MOBIC) 15 MG tablet TAKE 1 TABLET BY MOUTH ONCE DAILY AS NEEDED FOR  GOUT  FLARE   nitroGLYCERIN (NITROSTAT) 0.4 MG SL tablet Place 1 tablet (0.4 mg total) under the tongue every 5 (five) minutes as needed for chest pain.   pravastatin (PRAVACHOL) 40 MG tablet TAKE ONE TABLET BY MOUTH ONCE DAILY   predniSONE (DELTASONE) 20 MG tablet Take 1 tablet (20 mg total) by mouth daily. For 1-2 days prn  gout flare   probenecid (BENEMID) 500 MG tablet TAKE 1/2 TABLET BY MOUTH TWICE DAILY   valsartan (DIOVAN) 320 MG tablet Take 1 tablet (320 mg total) by mouth daily.   vitamin C (ASCORBIC ACID) 500 MG tablet Take 500 mg by mouth daily.   No facility-administered encounter medications on file as of 01/14/2021.   BP Readings from Last 3 Encounters:  12/17/20 (!) 141/76  12/10/20 138/65  11/26/20 137/71    Lab Results  Component Value Date   HGBA1C 5.6 10/24/2020    Patient obtains medications through Vials  90 Days   Last adherence delivery date:10/31/2020      Patient is due for next adherence delivery on: 01/24/2021  Multiple attempts made to reach patient on 01/15/21, 01/17/21, 01/20/21 . Unsuccessful outreach. Will refill based off of last adherence fill.   This delivery to include: Vials  90 Days  Pravastatin 40 mg  - 1 tablet qpm Probenecid 500 mg  -  tablet BID Hydralazine 100 mg- Take 1.5 tablets TID may increase to QID if needed for blood pressure Valsartan 320 mg take  1 tablet at breakfast  Patient is not due the following medications this month: Losartan 100 mg - 1 tablet daily(replaced with valsartan) PRN -  Gabapentin 300mg  take 1 tablet at breakfast 1 tablet evening meal, 2 tablets at bedtime Colchicine 0.6mg  take 1 tablet at breakfast  Hydrocodone 10/325mg  take 1 tablet every 6 hours prn pain   No  refill request needed from PCP.  Unable to confirm delivery date of 01/24/2021. Pharmacy will contact him the morning of delivery. Unable to verify medications   Follow-Up:  Coordination of Enhanced Pharmacy Services and Pharmacist Review  [name], CPP notified  [name], [place] Assistant [phone]  I have reviewed the care management and care coordination activities outlined in this encounter and I am certifying that I agree with the content of this note. No further action required.  [name], PharmD Clinical Pharmacist [place]  [facility] at [facility] [phone]

## 2021-01-14 NOTE — Telephone Encounter (Signed)
Losartan changed to valsartan 320 mg tablet, 1 tab daily, per 10/28/20 AWV prt 2.  New rx sent to [facility].  Denied losartan refill request.

## 2021-01-16 ENCOUNTER — Telehealth: Payer: PPO

## 2021-01-21 ENCOUNTER — Other Ambulatory Visit: Payer: Self-pay | Admitting: Family Medicine

## 2021-01-22 ENCOUNTER — Encounter: Payer: Self-pay | Admitting: Family Medicine

## 2021-01-22 MED ORDER — HYDRALAZINE HCL 100 MG PO TABS: ORAL_TABLET | ORAL | 11 refills | Status: DC

## 2021-01-22 MED ORDER — HYDRALAZINE HCL 100 MG PO TABS: ORAL_TABLET | ORAL | 3 refills | Status: DC

## 2021-01-22 NOTE — Telephone Encounter (Signed)
Patient left a voicemail stating that he is trying to find out why his medication was denied. Patient stated that he has already sent [name] a my chart message. Patient stated that he is almost out of the medication.

## 2021-01-22 NOTE — Telephone Encounter (Signed)
Duplicate request.  Sent in 01/08/21, #90/0.

## 2021-01-24 NOTE — Telephone Encounter (Signed)
This was sent to [facility] however I don't think they fill controlled substances - can you call pharmacy to verify where refill needs to go?

## 2021-01-24 NOTE — Telephone Encounter (Signed)
Spoke with [facility] asking about med.  States they can get it to pt when it's due.

## 2021-01-24 NOTE — Telephone Encounter (Signed)
ERx 

## 2021-03-17 ENCOUNTER — Ambulatory Visit: Payer: PPO | Admitting: Cardiovascular Disease

## 2021-03-18 ENCOUNTER — Telehealth: Payer: Self-pay

## 2021-03-18 NOTE — Chronic Care Management (AMB) (Addendum)
    Chronic Care Management Pharmacy Assistant   Name: Randy Kennedy  MRN: [id number] DOB: November 03, 1950  Reason for Encounter: General Adherence  Recent office visits:  01/22/21 - PCP telephone call - Patient concerned about denial for hydralazine. Dr.G did not receive a refill request, refill approved.  Recent consult visits:  None since last CCM contact  Hospital visits:  12/17/20 [facility] - Patient presented for colonoscopy. 12/10/20 - [place] - Patient presented for cataract extraction.  Medications: Outpatient Encounter Medications as of 03/18/2021  Medication Sig   HYDROcodone-acetaminophen (NORCO) 10-325 MG tablet Take 1 tablet by mouth every 6 (six) hours as needed.   aspirin EC 81 MG tablet Take 81 mg by mouth daily.   cetirizine (ZYRTEC) 10 MG tablet Take 10 mg by mouth at bedtime. PRN   colchicine 0.6 MG tablet Take 1 tablet (0.6 mg total) by mouth daily.   furosemide (LASIX) 20 MG tablet Take 1 tablet (20 mg total) by mouth 3 (three) times a week. (Patient taking differently: Take 20 mg by mouth 3 (three) times a week. M,W,F)   gabapentin (NEURONTIN) 300 MG capsule Take 1 capsule (300 mg total) by mouth 2 (two) times daily AND 2 capsules (600 mg total) at bedtime.   hydrALAZINE (APRESOLINE) 100 MG tablet TAKE 1 AND 1/2 TABLETS BY MOUTH THREE TIMES DAILY, MAY increase TO FOUR TIMES DAILY AS NEEDED FOR HIGH BLOOD PRESSURE   meloxicam (MOBIC) 15 MG tablet TAKE 1 TABLET BY MOUTH ONCE DAILY AS NEEDED FOR  GOUT  FLARE   nitroGLYCERIN (NITROSTAT) 0.4 MG SL tablet Place 1 tablet (0.4 mg total) under the tongue every 5 (five) minutes as needed for chest pain.   pravastatin (PRAVACHOL) 40 MG tablet TAKE ONE TABLET BY MOUTH ONCE DAILY   predniSONE (DELTASONE) 20 MG tablet Take 1 tablet (20 mg total) by mouth daily. For 1-2 days prn gout flare   probenecid (BENEMID) 500 MG tablet TAKE 1/2 TABLET BY MOUTH TWICE DAILY   valsartan (DIOVAN) 320 MG tablet Take 1 tablet (320 mg  total) by mouth daily.   vitamin C (ASCORBIC ACID) 500 MG tablet Take 500 mg by mouth daily.   No facility-administered encounter medications on file as of 03/18/2021.    Attempted contact with [name] 3 times on 03/18/21, 03/21/21, 03/24/21. Unsuccessful outreach.   Patient is not > 5 days past due for refill on the following medications per chart history:  [facility] Medications: Medication Name/mg Last Fill Days Supply Pravastatin '40mg'$   01/21/21 90 Valsartan '320mg'$   01/21/21 90  Care Gaps:  None Last annual wellness visit: 10/28/20  No appointments scheduled within the next 30 days.  [name], CPP notified  [name], [place] Assistant [phone]  Scheduled CCM visit for October due to multiple failed telephone contacts.  I have reviewed the care management and care coordination activities outlined in this encounter and I am certifying that I agree with the content of this note. No further action required.  [name], PharmD Clinical Pharmacist [place] [facility] [phone]

## 2021-03-27 ENCOUNTER — Other Ambulatory Visit: Payer: Self-pay | Admitting: Family Medicine

## 2021-03-27 MED ORDER — HYDROCODONE-ACETAMINOPHEN 10-325 MG PO TABS: 1.0000 | ORAL_TABLET | Freq: Four times a day (QID) | ORAL | 0 refills | Status: DC | PRN

## 2021-03-27 NOTE — Telephone Encounter (Signed)
ERx 

## 2021-03-27 NOTE — Telephone Encounter (Signed)
Name of Medication: Hydrocodone-APAP Name of Pharmacy: [facility] Last Fill or Written Date and Quantity: 01/24/21, #90 Last Office Visit and Type: 10/28/20, AWV prt 2 Next Office Visit and Type: none Last Controlled Substance Agreement Date: 09/29/16 Last UDS: 03/10/19

## 2021-03-28 ENCOUNTER — Encounter: Payer: Self-pay | Admitting: Nurse Practitioner

## 2021-03-28 ENCOUNTER — Ambulatory Visit
Admission: RE | Admit: 2021-03-28 | Discharge: 2021-03-28 | Disposition: A | Discharge status: Home / Self Care | Payer: PPO | Attending: Nurse Practitioner | Admitting: Nurse Practitioner

## 2021-03-28 ENCOUNTER — Other Ambulatory Visit: Payer: Self-pay

## 2021-03-28 ENCOUNTER — Ambulatory Visit
Admission: RE | Admit: 2021-03-28 | Discharge: 2021-03-28 | Disposition: A | Discharge status: Home / Self Care | Payer: PPO | Source: Ambulatory Visit | Attending: Nurse Practitioner | Admitting: Nurse Practitioner

## 2021-03-28 ENCOUNTER — Ambulatory Visit (INDEPENDENT_AMBULATORY_CARE_PROVIDER_SITE_OTHER): Payer: PPO | Admitting: Nurse Practitioner

## 2021-03-28 VITALS — BP 170/74 | HR 59 | Temp 98.2°F | Resp 14 | Ht 66.5 in | Wt 236.8 lb

## 2021-03-28 DIAGNOSIS — M25572 Pain in left ankle and joints of left foot: Secondary | ICD-10-CM | POA: Diagnosis not present

## 2021-03-28 DIAGNOSIS — M25472 Effusion, left ankle: Secondary | ICD-10-CM | POA: Insufficient documentation

## 2021-03-28 DIAGNOSIS — M7989 Other specified soft tissue disorders: Secondary | ICD-10-CM | POA: Diagnosis not present

## 2021-03-28 LAB — COMPREHENSIVE METABOLIC PANEL
ALT: 33 U/L (ref 0–53)
AST: 26 U/L (ref 0–37)
Albumin: 4.4 g/dL (ref 3.5–5.2)
Alkaline Phosphatase: 56 U/L (ref 39–117)
BUN: 16 mg/dL (ref 6–23)
CO2: 30 mEq/L (ref 19–32)
Calcium: 9.5 mg/dL (ref 8.4–10.5)
Chloride: 103 mEq/L (ref 96–112)
Creatinine, Ser: 1.25 mg/dL (ref 0.40–1.50)
GFR: 58.42 mL/min — ABNORMAL LOW (ref >60.00)
Glucose, Bld: 111 mg/dL — ABNORMAL HIGH (ref 70–99)
Potassium: 4 mEq/L (ref 3.5–5.1)
Sodium: 142 mEq/L (ref 135–145)
Total Bilirubin: 1.2 mg/dL (ref 0.2–1.2)
Total Protein: 7.5 g/dL (ref 6.0–8.3)

## 2021-03-28 LAB — CBC
HCT: 43 % (ref 39.0–52.0)
Hemoglobin: 14.8 g/dL (ref 13.0–17.0)
MCHC: 34.4 g/dL (ref 30.0–36.0)
MCV: 91.2 fl (ref 78.0–100.0)
Platelets: 143 10*3/uL — ABNORMAL LOW (ref 150.0–400.0)
RBC: 4.71 Mil/uL (ref 4.22–5.81)
RDW: 13.2 % (ref 11.5–15.5)
WBC: 6.4 10*3/uL (ref 4.0–10.5)

## 2021-03-28 LAB — BRAIN NATRIURETIC PEPTIDE: Pro B Natriuretic peptide (BNP): 36 pg/mL (ref 0.0–100.0)

## 2021-03-28 NOTE — Progress Notes (Signed)
Acute Office Visit  Subjective:    Patient ID: Randy Kennedy, male    DOB: 07-Sep-1950, 70 y.o.   MRN: [id number]  Chief Complaint  Patient presents with   Ankle Pain    X 1 week. Left ankle. Noticed it after mowing his yard. No injury that he knows of, he has had 5 surgeries in the past on that ankle. Pain radiates to the lower left leg. Pain is described as burning pain.    HPI Patient is in today for ankle pain:  States that he mowed his lawn with a push mower approx 1 week. More swollen then normal, burning pain, swelling decreases with elevation. History of gout. Does not feel like that. Has neuropathy different pain. History of many surgeries to the left ankle. He denies history of DVT or blood clot.   Past Medical History:  Diagnosis Date   Allergic rhinitis    Arthritis    Chest pain    a. 11/2011 reportedly normal stress test performed in [place].   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 moderate, AHI 37.4 increased to 80 in REM, on BiPAP 14/10, 97% compliance >4 hrs (06/2013); b. Does not tolerate CPAP.   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 Treated with vicodin, prior tried fentanyl and butrans.   Systolic murmur    Wears dentures    Partial lower   Wears hearing aid in both ears    Does not wear all the time    Past Surgical History:  Procedure Laterality Date   CATARACT EXTRACTION W/PHACO Right 11/26/2020   Procedure: CATARACT EXTRACTION PHACO AND INTRAOCULAR LENS PLACEMENT ([facility]) RIGHT VIVITY TORIC LENS 7.17 00:49.6;  Surgeon: Birder Robson, MD;  Location: [facility];  Service: Ophthalmology;  Laterality: Right;  Latex Sleep Apnea   CATARACT EXTRACTION W/PHACO Left 12/10/2020   Procedure: CATARACT EXTRACTION PHACO AND INTRAOCULAR LENS PLACEMENT ([facility]) LEFT VIVITY;  Surgeon: Birder Robson, MD;  Location: [facility];  Service: Ophthalmology;  Laterality: Left;  Latex Sleep apnea 5.37 00:32.7   COLONOSCOPY  02/2011   ext hem, benign polyp, rpt 5 yrs ([place])   COLONOSCOPY WITH PROPOFOL N/A 12/17/2020   multiple TA, ext hem, rpt 3 yrs ([place], Tally Due, MD)   LEG SURGERY  x5   left - after fall at work (3.5 stories)   [place]   jaw fracture - horse kick  TYMPANIC MEMBRANE REPAIR Left 1994   otosclerosis    Family History  Problem Relation Age of Onset   Cancer Father 39       prostate   Cancer Mother        rectal   Hypertension Mother    Cancer Paternal Uncle        prostate   Diabetes Neg Hx    Stroke Neg Hx    CAD Neg Hx     Social History   Socioeconomic History   Marital status: Married    Spouse name: Not on file   Number of children: Not on file   Years of education: Not on file   Highest education level: Not on file  Occupational History   Not on file  Tobacco Use   Smoking status: Former    Packs/day: 1.00    Years: 4.00    Pack years: 4.00    Types: Pipe, Cigarettes    Quit date: 07/27/1978    Years since quitting: 42.6   Smokeless tobacco: Never  Vaping Use   Vaping Use:  Never used  Substance and Sexual Activity   Alcohol use: No    Alcohol/week: 0.0 standard drinks   Drug use: No   Sexual activity: Not on file  Other Topics Concern   Not on file  Social History Narrative   Caffeine: 2 cans coke/day   Lives with wife and grown son    Occupation: retired, was Agricultural engineer.   On disability for chronic pain   Edu: MBA   Activity: no regular exercise   Deit: good water, fruits/vegetables daily   Social Determinants of Health   Financial Resource Strain: Low Risk    Difficulty of Paying Living Expenses: Not hard at all  Food Insecurity: No Food Insecurity   Worried About Charity fundraiser in the Last Year: Never true   Ran Out of Food in the Last Year: Never true  Transportation Needs: No Transportation Needs   Lack of Transportation (Medical): No   Lack of Transportation (Non-Medical): No  Physical Activity: Inactive   Days of Exercise per Week: 0 days   Minutes of Exercise per Session: 0 min  Stress: No Stress Concern Present   Feeling of Stress : Not at all  Social Connections: Not on file  Intimate Partner Violence: Not At Risk   Fear of Current or Ex-Partner: No   Emotionally Abused: No   Physically Abused: No   Sexually Abused: No    Outpatient Medications Prior to Visit  Medication Sig Dispense Refill   aspirin EC 81 MG tablet Take 81 mg by mouth daily.     cetirizine (ZYRTEC) 10 MG tablet Take 10 mg by mouth at bedtime. PRN     colchicine 0.6 MG tablet Take 1 tablet (0.6 mg total) by mouth daily. 90 tablet 1   gabapentin (NEURONTIN) 300 MG capsule Take 1 capsule (300 mg total) by mouth 2 (two) times daily AND 2 capsules (600 mg total) at bedtime. 360 capsule 3   hydrALAZINE (APRESOLINE) 100 MG tablet TAKE 1 AND 1/2 TABLETS BY MOUTH THREE TIMES DAILY, MAY increase TO FOUR TIMES DAILY AS NEEDED FOR HIGH BLOOD PRESSURE 150 tablet 11   HYDROcodone-acetaminophen (NORCO) 10-325 MG tablet Take 1 tablet by mouth every 6 (six)  hours as needed. 90 tablet 0   meloxicam (MOBIC) 15 MG tablet TAKE 1 TABLET BY MOUTH ONCE DAILY AS NEEDED FOR  GOUT  FLARE 30 tablet  0   nitroGLYCERIN (NITROSTAT) 0.4 MG SL tablet Place 1 tablet (0.4 mg total) under the tongue every 5 (five) minutes as needed for chest pain. 25 tablet 3   pravastatin (PRAVACHOL) 40 MG tablet TAKE ONE TABLET BY MOUTH ONCE DAILY 90 tablet 3   predniSONE (DELTASONE) 20 MG tablet Take 1 tablet (20 mg total) by mouth daily. For 1-2 days prn gout flare 20 tablet 0   probenecid (BENEMID) 500 MG tablet TAKE 1/2 TABLET BY MOUTH TWICE DAILY 90 tablet 3   valsartan (DIOVAN) 320 MG tablet Take 1 tablet (320 mg total) by mouth daily. 90 tablet 3   vitamin C (ASCORBIC ACID) 500 MG tablet Take 500 mg by mouth daily.     furosemide (LASIX) 20 MG tablet Take 1 tablet (20 mg total) by mouth 3 (three) times a week. (Patient taking differently: Take 20 mg by mouth 3 (three) times a week. M,W,F) 39 tablet 3   No facility-administered medications prior to visit.    Allergies  Allergen Reactions   Allopurinol Other (See Comments)    GI upset   Lisinopril-Hydrochlorothiazide     Burning in feet.   Procardia [Nifedipine] Other (See Comments)    Bradycardia, leg swelling   Spironolactone Other (See Comments)    malaise, dizziness   Amlodipine Rash    BLE   Doxazosin Mesylate Rash   Latex Rash   Nortriptyline Rash   Uloric [Febuxostat] Rash    Review of Systems  Constitutional:  Negative for chills and fever.  Respiratory:  Negative for shortness of breath.   Cardiovascular:  Negative for chest pain, palpitations and leg swelling.  Gastrointestinal:  Negative for nausea and vomiting.  Musculoskeletal:  Positive for arthralgias and joint swelling.  Skin:  Positive for color change.  Neurological:  Negative for weakness and numbness.      Objective:    Physical Exam Vitals and nursing note reviewed.  Constitutional:      Appearance: He is obese.  Cardiovascular:      Rate and Rhythm: Normal rate and regular rhythm.     Pulses:          Dorsalis pedis pulses are 2+ on the right side and 2+ on the left side.  Pulmonary:     Effort: Pulmonary effort is normal.     Breath sounds: Normal breath sounds.  Abdominal:     General: Bowel sounds are normal.  Musculoskeletal:        General: Swelling and tenderness present. No deformity or signs of injury.       Feet:     Comments: Left leg 39cm Right leg: 41cm  Skin:    General: Skin is warm.     Findings: No bruising or erythema.     Comments: Bilaterally lower extremity brownish discoloration   Neurological:     Mental Status: He is alert.  Psychiatric:        Mood and Affect: Mood normal.        Behavior: Behavior normal.        Thought Content: Thought content normal.        Judgment: Judgment normal.    BP (!) 170/74   Pulse (!) 59   Temp 98.2 F (36.8 C)   Resp 14   Ht 5' 6.5" (1.689 m)   Wt 236 lb 12 oz (107.4 kg)   SpO2 95%   BMI 37.64 kg/m  Wt Readings from Last 3 Encounters:  03/28/21 236 lb 12 oz (  107.4 kg)  12/17/20 215 lb (97.5 kg)  12/10/20 225 lb (102.1 kg)    Health Maintenance Due  Topic Date Due   Zoster Vaccines- Shingrix (1 of 2) Never done   COVID-19 Vaccine (2 - Booster for Janssen series) 12/27/2019   INFLUENZA VACCINE  02/24/2021    There are no preventive care reminders to display for this patient.   Lab Results  Component Value Date   TSH 2.82 08/26/2020   Lab Results  Component Value Date   WBC 6.0 08/26/2020   HGB 14.6 08/26/2020   HCT 42.6 08/26/2020   MCV 91.5 08/26/2020   PLT 129.0 (L) 08/26/2020   Lab Results  Component Value Date   NA 140 10/24/2020   K 3.8 10/24/2020   CO2 27 10/24/2020   GLUCOSE 102 (H) 10/24/2020   BUN 23 10/24/2020   CREATININE 1.08 10/24/2020   BILITOT 1.1 08/26/2020   ALKPHOS 69 08/26/2020   AST 20 08/26/2020   ALT 26 08/26/2020   PROT 7.1 08/26/2020   ALBUMIN 4.3 08/26/2020   CALCIUM 9.6 10/24/2020    ANIONGAP 11 10/04/2017   GFR 69.83 10/24/2020   Lab Results  Component Value Date   CHOL 186 10/24/2020   Lab Results  Component Value Date   HDL 76.90 10/24/2020   Lab Results  Component Value Date   LDLCALC 76 10/24/2020   Lab Results  Component Value Date   TRIG 165.0 (H) 10/24/2020   Lab Results  Component Value Date   CHOLHDL 2 10/24/2020   Lab Results  Component Value Date   HGBA1C 5.6 10/24/2020       Assessment & Plan:   Problem List Items Addressed This Visit       Other   Pain and swelling of left ankle - Primary    actue pain and swelling the left lower ankle after he got done push mowing his lawn. Hx of many surgeries to the ankle. Hx of gout. No hx of DVT. Ordered xray that showed no acute abnormality. Pending lab results. S&S discussed with patient that would require immediate intervention. He acknowledged      Relevant Orders   CBC   Comprehensive metabolic panel   Brain natriuretic peptide   DG Ankle Complete Left (Completed)     No orders of the defined types were placed in this encounter.  This visit occurred during the SARS-CoV-2 public health emergency.  Safety protocols were in place, including screening questions prior to the visit, additional usage of staff PPE, and extensive cleaning of exam room while observing appropriate contact time as indicated for disinfecting solutions.   Romilda Garret, NP

## 2021-03-28 NOTE — Patient Instructions (Signed)
Nice to see you today.  Will be in touch in regards to xray results and labs Follow up if symptoms do not improve or worsen

## 2021-03-28 NOTE — Assessment & Plan Note (Addendum)
actue pain and swelling the left lower ankle after he got done push mowing his lawn. Hx of many surgeries to the ankle. Hx of gout. No hx of DVT. Ordered xray that showed no acute abnormality. Pending lab results. S&S discussed with patient that would require immediate intervention. He acknowledged.  Modified wells score shows unlikely a DVT

## 2021-04-03 ENCOUNTER — Telehealth: Payer: Self-pay

## 2021-04-03 NOTE — Telephone Encounter (Signed)
I spoke with pt; last BP ck in office; pt denied calling about any BP issues. Pt said he feels fine and does not know who called our office. Advised pt if has problem to please give our office a call and I apologized for bothering him. Pt said that was OK and thanked me and hung up. Will send note to Dr [name] who is out of office, Dr Damita Dunnings who is in office and Lattie Haw CMA.

## 2021-04-03 NOTE — Telephone Encounter (Signed)
[facility] - Client TELEPHONE ADVICE RECORD [facility] Patient Name: Randy Kennedy [facility] Gender: Male DOB: [birth year] Age: 70 Y 3 M 28 D Return Phone Number: [phone] (Primary) Address: City/ State/ Zip: [place] [address] Client [name] - Client Client Site [place] Physician [name] - MD Contact Type Call Who Is Calling Patient / Member / Family / Caregiver Call Type Triage / Clinical Relationship To Patient Self Return Phone Number [phone] (Primary) Chief Complaint Blood Pressure High Reason for Call Symptomatic / Request for [place] states Cindy w/office; Caller states pt called about BP; when called the office back to verify she doesn't know if high or low; Called pt back 2x; goes to voice mail. Translation No Disp. Time Eilene Ghazi Time) Disposition Final User 04/03/2021 12:42:27 PM Attempt made - message left Raphael Gibney, RN, Vanita Ingles 04/03/2021 1:17:42 PM Attempt made - message left Raphael Gibney, RN, Vanita Ingles 04/03/2021 1:33:54 PM FINAL ATTEMPT MADE - message left Raphael Gibney, RN, Vera 04/03/2021 1:34:06 PM Send to RN Final Attempt Irene Limbo, RN, Vanita Ingles 04/03/2021 2:08:36 PM FINAL ATTEMPT MADE - no message left Yes Markus Daft,

## 2021-04-04 NOTE — Telephone Encounter (Signed)
Noted. Thanks.

## 2021-04-08 ENCOUNTER — Ambulatory Visit: Payer: PPO | Admitting: Nurse Practitioner

## 2021-04-09 ENCOUNTER — Telehealth: Payer: Self-pay

## 2021-04-09 NOTE — Progress Notes (Addendum)
Chronic Care Management Pharmacy Assistant   Name: Randy Kennedy  MRN: [id number] DOB: 02-Oct-1950  Reason for Encounter: Medication Adherence and Delivery Coordination   Recent office visits:  03/28/2021 - [name], NP - Patient presented for ankle pain and swelling. Labs: CBC, CMP, Brain natriuretic peptide and DG Ankle Complete Left.   Recent consult visits:  None since last CCM contact  Hospital visits:  None in previous 6 months  Medications: Outpatient Encounter Medications as of 04/09/2021  Medication Sig   aspirin EC 81 MG tablet Take 81 mg by mouth daily.   cetirizine (ZYRTEC) 10 MG tablet Take 10 mg by mouth at bedtime. PRN   colchicine 0.6 MG tablet Take 1 tablet (0.6 mg total) by mouth daily.   furosemide (LASIX) 20 MG tablet Take 1 tablet (20 mg total) by mouth 3 (three) times a week. (Patient taking differently: Take 20 mg by mouth 3 (three) times a week. M,W,F)   gabapentin (NEURONTIN) 300 MG capsule Take 1 capsule (300 mg total) by mouth 2 (two) times daily AND 2 capsules (600 mg total) at bedtime.   hydrALAZINE (APRESOLINE) 100 MG tablet TAKE 1 AND 1/2 TABLETS BY MOUTH THREE TIMES DAILY, MAY increase TO FOUR TIMES DAILY AS NEEDED FOR HIGH BLOOD PRESSURE   HYDROcodone-acetaminophen (NORCO) 10-325 MG tablet Take 1 tablet by mouth every 6 (six) hours as needed.   meloxicam (MOBIC) 15 MG tablet TAKE 1 TABLET BY MOUTH ONCE DAILY AS NEEDED FOR  GOUT  FLARE   nitroGLYCERIN (NITROSTAT) 0.4 MG SL tablet Place 1 tablet (0.4 mg total) under the tongue every 5 (five) minutes as needed for chest pain.   pravastatin (PRAVACHOL) 40 MG tablet TAKE ONE TABLET BY MOUTH ONCE DAILY   predniSONE (DELTASONE) 20 MG tablet Take 1 tablet (20 mg total) by mouth daily. For 1-2 days prn gout flare   probenecid (BENEMID) 500 MG tablet TAKE 1/2 TABLET BY MOUTH TWICE DAILY   valsartan (DIOVAN) 320 MG tablet Take 1 tablet (320 mg total) by mouth daily.   vitamin C (ASCORBIC ACID) 500 MG  tablet Take 500 mg by mouth daily.   No facility-administered encounter medications on file as of 04/09/2021.   BP Readings from Last 3 Encounters:  03/28/21 (!) 170/74  12/17/20 (!) 141/76  12/10/20 138/65    Lab Results  Component Value Date   HGBA1C 5.6 10/24/2020    Recent OV, Consult or Hospital visit:  No medication changes indicated  Last adherence delivery date: 01/24/2021  Patient is due for next adherence delivery on: 04/18/2021  Multiple attempts made to reach patient. Unsuccessful outreach. Will refill based off of last adherence fill.   This delivery to include: Vials  90 Days  Hydralazine 100 mg- Take 1.5 tablets TID may increase to QID if needed for blood pressure Probenecid 500 mg  -  tablet BID Pravastatin 40 mg  - 1 tablet qpm Valsartan 320 mg take  1 tablet at breakfast Colchicine 0.'6mg'$  take 1 tablet at breakfast  Hydrocodone 10/'325mg'$  take 1 tablet every 6 hours prn pain  Gabapentin '300mg'$  take 1 tablet at breakfast 1 tablet evening meal, 2 tablets at bedtime   Annual wellness visit in last year? Yes 10/28/2020 Most Recent BP reading: 170/74 on 03/28/2021  [name], CPP notified  Marijean Niemann, [place] Assistant [phone]   Unable to reach patient after several attempts  I have reviewed the care management and care coordination activities outlined in this encounter and  I am certifying that I agree with the content of this note. Patient has CCM visit 04/2021 to discuss blood pressure and adherence.  [name], PharmD Clinical Pharmacist [facility] at [facility] [phone]

## 2021-04-12 ENCOUNTER — Other Ambulatory Visit: Payer: Self-pay | Admitting: Family Medicine

## 2021-04-16 ENCOUNTER — Other Ambulatory Visit: Payer: Self-pay | Admitting: Family Medicine

## 2021-04-16 NOTE — Telephone Encounter (Signed)
Denied as seems refill request too early (last refilled 03/27/2021)

## 2021-04-16 NOTE — Telephone Encounter (Signed)
Name of Medication: Hydrocodone Name of Pharmacy: [facility] or Written Date and Quantity: 03/27/21 #90 Last Office Visit and Type:03/28/21 acute   Next Office Visit and Type: none scheduled Last Controlled Substance Agreement Date: 09/29/16 Last UDS:03/10/19

## 2021-04-18 ENCOUNTER — Other Ambulatory Visit: Payer: Self-pay | Admitting: Family Medicine

## 2021-04-19 NOTE — Telephone Encounter (Signed)
ERx 

## 2021-04-28 ENCOUNTER — Telehealth: Payer: Self-pay

## 2021-04-28 NOTE — Chronic Care Management (AMB) (Addendum)
    Chronic Care Management Pharmacy Assistant   Name: Randy Kennedy  MRN: [id number] DOB: 02/02/1951   Reason for Encounter: CCM Reminder Call   Conditions to be addressed/monitored:HTN and HLD   Medications: Outpatient Encounter Medications as of 04/28/2021  Medication Sig   aspirin EC 81 MG tablet Take 81 mg by mouth daily.   cetirizine (ZYRTEC) 10 MG tablet Take 10 mg by mouth at bedtime. PRN   colchicine 0.6 MG tablet TAKE ONE TABLET BY MOUTH DAILY   furosemide (LASIX) 20 MG tablet Take 1 tablet (20 mg total) by mouth 3 (three) times a week. (Patient taking differently: Take 20 mg by mouth 3 (three) times a week. M,W,F)   gabapentin (NEURONTIN) 300 MG capsule Take 1 capsule (300 mg total) by mouth 2 (two) times daily AND 2 capsules (600 mg total) at bedtime.   hydrALAZINE (APRESOLINE) 100 MG tablet TAKE 1 AND 1/2 TABLETS BY MOUTH THREE TIMES DAILY, MAY increase TO FOUR TIMES DAILY AS NEEDED FOR HIGH BLOOD PRESSURE   HYDROcodone-acetaminophen (NORCO) 10-325 MG tablet TAKE ONE TABLET BY MOUTH EVERY 6 HOURS AS NEEDED   meloxicam (MOBIC) 15 MG tablet TAKE 1 TABLET BY MOUTH ONCE DAILY AS NEEDED FOR  GOUT  FLARE   nitroGLYCERIN (NITROSTAT) 0.4 MG SL tablet Place 1 tablet (0.4 mg total) under the tongue every 5 (five) minutes as needed for chest pain.   pravastatin (PRAVACHOL) 40 MG tablet TAKE ONE TABLET BY MOUTH ONCE DAILY   predniSONE (DELTASONE) 20 MG tablet Take 1 tablet (20 mg total) by mouth daily. For 1-2 days prn gout flare   probenecid (BENEMID) 500 MG tablet TAKE 1/2 TABLET BY MOUTH TWICE DAILY   valsartan (DIOVAN) 320 MG tablet Take 1 tablet (320 mg total) by mouth daily.   vitamin C (ASCORBIC ACID) 500 MG tablet Take 500 mg by mouth daily.   No facility-administered encounter medications on file as of 04/28/2021.    [name] did not answer the telephone to remind him of his upcoming telephone visit with [name] on 04/29/21 at 8:30am. Patient was reminded  to have all medications, supplements and any blood glucose and blood pressure readings available for review at appointment by voicemail.   Star Rating Drugs: Medication:  Last Fill: Day Supply Pravastatin 40mg                     04/16/21            90 Valsartan 320mg                      04/16/21            [name], CPP notified  [name], [place] Assistant [phone]  I have reviewed the care management and care coordination activities outlined in this encounter and I am certifying that I agree with the content of this note. No further action required.  [name], PharmD Clinical Pharmacist [facility] at [facility] [phone]

## 2021-04-29 ENCOUNTER — Ambulatory Visit (INDEPENDENT_AMBULATORY_CARE_PROVIDER_SITE_OTHER): Payer: PPO

## 2021-04-29 ENCOUNTER — Other Ambulatory Visit: Payer: Self-pay

## 2021-04-29 DIAGNOSIS — I10 Essential (primary) hypertension: Secondary | ICD-10-CM

## 2021-04-29 DIAGNOSIS — E782 Mixed hyperlipidemia: Secondary | ICD-10-CM

## 2021-04-29 NOTE — Patient Instructions (Signed)
Dear Randy Kennedy,  Below is a summary of the goals we discussed during our follow up appointment on April 29, 2021. Please contact me anytime with questions or concerns.   Visit Information  Patient Care Plan: CCM Pharmacy Care Plan     Problem Identified: CHL AMB "PATIENT-SPECIFIC PROBLEM"      Long-Range Goal: Disease Management   Start Date: 04/29/2021  Priority: High  Note:    Current Barriers:  Unable to achieve control of blood pressure  Untreated sleep apnea   Pharmacist Clinical Goal(s):  Patient will contact provider office for questions/concerns as evidenced notation of same in electronic health record through collaboration with PharmD and provider.   Interventions: 1:1 collaboration with [name], MD regarding development and update of comprehensive plan of care as evidenced by provider attestation and co-signature Inter-disciplinary care team collaboration (see longitudinal plan of care) Comprehensive medication review performed; medication list updated in electronic medical record  Hypertension (BP goal <140/90) -Not ideally Controlled - home BP 170/70s on home readings and 170/75 at last office visit (dentist) -See cardio about once a year, last visit with Dr. Rockey Situ (08/2020), increased Lasix to TID. PCP switched losartan to valsartan April 2022. Multiple med intolerances. Bradycardia limits options as well. -Current treatment: Hydralazine 100 mg - 1 and 1/2 tablet TID (up to QID) Valsartan 320 mg - 1 tablet daily (morning) Lasix 20 mg - 1 tablet three days per week (M,W,F) -Medications previously tried: hctz (Gout), amlodipine (swelling), spironolactone (dizziness, rash), doxazosin (dizziness), metoprolol (bradycardia) -Current home readings: he has a home BP monitor (arm cuff), running ~170/75 at home (checked twice in the past month). Pt reports he has verified accuracy at office. HR running 43-45, increases to 50 mid-day (reports this is normal  for him). -Reports family history of HTN. He has sleep apnea - sleeps in recliner, unable to wear  CPAP due to rash/allergic reaction. Reports failed multiple sleep studies due to no REM sleep. He has been diagnosed with narcolepsy. Reports he tried the CPAP off and on for about a year and unable to tolerate due to allergic reaction. States he even tried maskless CPAP. Naps most afternoons. He takes gabapentin at night and it makes him drowsy. No issue falling asleep. Sleeps from 2 AM - 10 AM. States there are a lot of nights he does not sleep at all due to thinking (not anxiety) just thinking. Previously tried Ambien and did not like it due to grogginess. He declines interested in referral to sleep specialist today. -Current dietary habits: adds a lot table salt -Current exercise habits: Chases grandchildren around 3-4 days week -No alcohol or tobacco.  -Reports his weight fluctuates between 210-230.  -Denies hypotensive/hypertensive symptoms.  No dizziness, headaches, chest pain. -Educated on BP goals and benefits of medications for prevention of heart attack, stroke and kidney damage; -Counseled to monitor BP at home weekly, document, and provide log at future appointments -Counseled on diet and exercise extensively; Recommend avoiding table salt and limiting the salty six. Discussed importance of treatment of sleep apnea in BP control - pt declines further sleep study or referral to sleep specialist. Follow up 1 month for BP log and diet review. Limit NSAID use.  Hyperlipidemia: (LDL goal < 100) -Controlled  -Pt affirms adherence daily in evening -Current treatment: Pravastatin 40 mg - 1 tablet daily -Medications previously tried: none  -Educated on Cholesterol goals;  Exercise goal of 150 minutes per week; -Recommended to continue current medication  Other -  No gout flare in 8 months Got an hearing aid, works well  Gabapentin - unable to take during day due to drowsiness Takes 3  gabapentin every night Hydrocodone/APAP - takes TID Uses ibuprofen twice a day since dentist appt, usually takes about once/week Uses meloxicam and prednisone PRN gout flare up (takes 1 of each once daily until flare resolves) Nitroglycerin - has not used recently (> 6 months)  Patient Goals/Self-Care Activities Patient will:  - take medications as prescribed check blood pressure weekly, document, and provide at future appointments  Follow Up Plan: Telephone follow up appointment with care management team member scheduled for:  30 days  - lifestyle review for HTN control      Patient verbalizes understanding of instructions provided today and agrees to view in [place].   [name], PharmD Clinical Pharmacist [facility] [phone]

## 2021-04-29 NOTE — Progress Notes (Signed)
Chronic Care Management Pharmacy Note  04/29/2021 Name:  Randy Kennedy MRN:  [id number] DOB:  1950-12-03  Summary: Discussed HTN and HLD. Pt is adherent to medications as prescribed. BP is elevated. His home BP has been running 170s/70 on home check about twice/month. Denies s/s of HTN. He does admit to high salt intake. He has failed CPAP therapy, discussed in detail. Erratic sleep schedule. Declines interest in sleep specialist. LDL controlled on current therapy.  Recommendations/Changes made from today's visit: Recommended sleep specialist. Patient politely declined due to multiple failed sleep studies in the past. Recommend limiting salt and increasing activity for HTN. Discussed sleep hygiene.   Plan: CCM follow up 1 month - review salt intake/BP log   Subjective: Randy Kennedy is an 70 y.o. year old male who is a primary patient of Randy Bush, MD.  The CCM team was consulted for assistance with disease management and care coordination needs.    Engaged with patient by telephone for follow up visit in response to provider referral for pharmacy case management and/or care coordination services.   Consent to Services:  The patient was given information about Chronic Care Management services, agreed to services, and gave verbal consent prior to initiation of services.  Please see initial visit note for detailed documentation.   Patient Care Team: Randy Bush, MD as PCP - General (Family Medicine) Randy Kennedy, Randy Kennedy as Pharmacist (Pharmacist)  Recent office visits: 03/28/21 - Randy Ito, NP - Pt presented for pain and swelling. X-ray today. Follow up symptoms do not improve or worsen. 10/28/20 Randy Mina, MD, PCP - Pt presented for HTN. Chronic, uncontrolled. Multiple drug intolerances including lisinopril/hctz, amlodipine, spironolactone and doxazosin. Complaint with current regimen of losartan and hydralazine. Will change losartan to valsartan for more potent  ARB. Update with effect after monitoring at home for 2-3 wks. Refer to audiologist. Return in 3 months.  Recent consult visits: 12/17/20 - Colonoscopy -  polyps removed  Hospital visits: None in previous 6 months   Objective: Lab Results  Component Value Date   CREATININE 1.25 03/28/2021   BUN 16 03/28/2021   GFR 58.42 (L) 03/28/2021   GFRNONAA >60 10/04/2017   GFRAA >60 10/04/2017   NA 142 03/28/2021   K 4.0 03/28/2021   CALCIUM 9.5 03/28/2021   CO2 30 03/28/2021   GLUCOSE 111 (H) 03/28/2021    Lab Results  Component Value Date/Time   HGBA1C 5.6 10/24/2020 08:54 AM   HGBA1C 5.3 08/22/2019 08:01 AM   GFR 58.42 (L) 03/28/2021 09:33 AM   GFR 69.83 10/24/2020 08:54 AM     Lab Results  Component Value Date   CHOL 186 10/24/2020   HDL 76.90 10/24/2020   LDLCALC 76 10/24/2020   LDLDIRECT 90.0 07/01/2018   TRIG 165.0 (H) 10/24/2020   CHOLHDL 2 10/24/2020    Hepatic Function Latest Ref Rng & Units 03/28/2021 08/26/2020 08/22/2019  Total Protein 6.0 - 8.3 g/dL 7.5 7.1 7.2  Albumin 3.5 - 5.2 g/dL 4.4 4.3 4.5  AST 0 - 37 U/L '26 20 18  ' ALT 0 - 53 U/L 33 26 17  Alk Phosphatase 39 - 117 U/L 56 69 76  Total Bilirubin 0.2 - 1.2 mg/dL 1.2 1.1 0.8    Lab Results  Component Value Date/Time   TSH 2.82 08/26/2020 09:51 AM   TSH 0.80 03/24/2016 12:01 PM    CBC Latest Ref Rng & Units 03/28/2021 08/26/2020 08/22/2019  WBC 4.0 - 10.5 K/uL 6.4 6.0 5.6  Hemoglobin  13.0 - 17.0 g/dL 14.8 14.6 14.8  Hematocrit 39.0 - 52.0 % 43.0 42.6 42.8  Platelets 150.0 - 400.0 K/uL 143.0(L) 129.0(L) 145.0(L)    No results found for: VD25OH  Clinical ASCVD: No  The 10-year ASCVD risk score (Arnett DK, et al., 2019) is: 26.3%   Values used to calculate the score:     Age: 10 years     Sex: Male     Is Non-Hispanic African American: No     Diabetic: No     Tobacco smoker: No     Systolic Blood Pressure: 423 mmHg     Is BP treated: Yes     HDL Cholesterol: 76.9 mg/dL     Total Cholesterol: 186  mg/dL    Depression screen [facility] Kennedy 2/9 10/28/2020 10/01/2020 08/23/2019  Decreased Interest 0 0 0  Down, Depressed, Hopeless 0 0 0  PHQ - 2 Score 0 0 0  Altered sleeping 3 0 0  Tired, decreased energy 2 0 0  Change in appetite 0 0 0  Feeling bad or failure about yourself  0 0 0  Trouble concentrating 0 0 0  Moving slowly or fidgety/restless 0 0 0  Suicidal thoughts 0 0 0  PHQ-9 Score 5 0 0  Difficult doing work/chores - Not difficult at all Not difficult at all  Some recent data might be hidden    Social History   Tobacco Use  Smoking Status Former   Packs/day: 1.00   Years: 4.00   Pack years: 4.00   Types: Pipe, Cigarettes   Quit date: 07/27/1978   Years since quitting: 42.7  Smokeless Tobacco Never   BP Readings from Last 3 Encounters:  03/28/21 (!) 170/74  12/17/20 (!) 141/76  12/10/20 138/65   Pulse Readings from Last 3 Encounters:  03/28/21 (!) 59  12/17/20 (!) 44  12/10/20 (!) 44   Wt Readings from Last 3 Encounters:  03/28/21 236 lb 12 oz (107.4 kg)  12/17/20 215 lb (97.5 kg)  12/10/20 225 lb (102.1 kg)   BMI Readings from Last 3 Encounters:  03/28/21 37.64 kg/m  12/17/20 33.67 kg/m  12/10/20 35.24 kg/m    Assessment/Interventions: Review of patient past medical history, allergies, medications, health status, including review of consultants reports, laboratory and other test data, was performed as part of comprehensive evaluation and provision of chronic care management services.   SDOH:  (Social Determinants of Health) assessments and interventions performed: Yes SDOH Interventions    Flowsheet Row Most Recent Value  SDOH Interventions   Financial Strain Interventions Intervention Not Indicated      SDOH Screenings   Alcohol Screen: Low Risk    Last Alcohol Screening Score (AUDIT): 0  Depression (PHQ2-9): Medium Risk   PHQ-2 Score: 5  Financial Resource Strain: Low Risk    Difficulty of Paying Living Expenses: Not very hard  Food Insecurity: No  Food Insecurity   Worried About Charity fundraiser in the Last Year: Never true   Ran Out of Food in the Last Year: Never true  Housing: Low Risk    Last Housing Risk Score: 0  Physical Activity: Inactive   Days of Exercise per Week: 0 days   Minutes of Exercise per Session: 0 min  Social Connections: Not on file  Stress: No Stress Concern Present   Feeling of Stress : Not at all  Tobacco Use: Medium Risk   Smoking Tobacco Use: Former   Smokeless Tobacco Use: Never  Transportation Needs: No Transportation Needs  Lack of Transportation (Medical): No   Lack of Transportation (Non-Medical): No    CCM Care Plan  Allergies  Allergen Reactions   Allopurinol Other (See Comments)    GI upset   Lisinopril-Hydrochlorothiazide     Burning in feet.   Procardia [Nifedipine] Other (See Comments)    Bradycardia, leg swelling   Spironolactone Other (See Comments)    malaise, dizziness   Amlodipine Rash    BLE   Doxazosin Mesylate Rash   Latex Rash   Nortriptyline Rash   Uloric [Febuxostat] Rash    Medications Reviewed Today     Reviewed by Randy Kennedy, [facility] (Pharmacist) on 04/29/21 at [place] List Status: <None>   Medication Order Taking? Sig Documenting Provider Last Dose Status Informant  aspirin EC 81 MG tablet [id number] Yes Take 81 mg by mouth daily. [provider] Taking Active   colchicine 0.6 MG tablet [id number] Yes TAKE ONE TABLET BY MOUTH DAILY Randy Bush, MD Taking Active   fexofenadine [facility]) 180 MG tablet [id number] Yes Take 180 mg by mouth daily. [provider] Taking Active   furosemide (LASIX) 20 MG tablet [id number] Yes Take 1 tablet (20 mg total) by mouth 3 (three) times a week.  Patient taking differently: Take 20 mg by mouth 3 (three) times a week. M,W,F   Minna Merritts, MD Taking Active Self  gabapentin (NEURONTIN) 300 MG capsule [id number] Yes Take 1 capsule (300 mg total) by mouth 2 (two) times daily AND 2 capsules (600 mg  total) at bedtime. Randy Bush, MD Taking Active   hydrALAZINE (APRESOLINE) 100 MG tablet [id number] Yes TAKE 1 AND 1/2 TABLETS BY MOUTH THREE TIMES DAILY, MAY increase TO FOUR TIMES DAILY AS NEEDED FOR HIGH BLOOD PRESSURE Randy Bush, MD Taking Active   HYDROcodone-acetaminophen [facility]) 10-325 MG tablet [id number] Yes TAKE ONE TABLET BY MOUTH EVERY 6 HOURS AS NEEDED Randy Bush, MD Taking Active   meloxicam (MOBIC) 15 MG tablet [id number] Yes TAKE 1 TABLET BY MOUTH ONCE DAILY AS NEEDED FOR  GOUT  FLARE Randy Bush, MD Taking Active   nitroGLYCERIN (NITROSTAT) 0.4 MG SL tablet [id number] Yes Place 1 tablet (0.4 mg total) under the tongue every 5 (five) minutes as needed for chest pain. Minna Merritts, MD Taking Active   pravastatin (PRAVACHOL) 40 MG tablet [id number] Yes TAKE ONE TABLET BY MOUTH ONCE DAILY Randy Bush, MD Taking Active   predniSONE (DELTASONE) 20 MG tablet [id number] Yes Take 1 tablet (20 mg total) by mouth daily. For 1-2 days prn gout flare Randy Bush, MD Taking Active   probenecid (BENEMID) 500 MG tablet [id number] Yes TAKE 1/2 TABLET BY MOUTH TWICE DAILY Randy Bush, MD Taking Active   valsartan (DIOVAN) 320 MG tablet [id number] Yes Take 1 tablet (320 mg total) by mouth daily. Randy Bush, MD Taking Active   vitamin C (ASCORBIC ACID) 500 MG tablet [id number] Yes Take 500 mg by mouth daily. [provider] Taking Active             Patient Active Problem List   Diagnosis Date Noted   Pain and swelling of left ankle 03/28/2021   Family history of colon cancer in mother    Occult blood positive stool    Hearing loss 10/31/2020   Open wound of right external ear 10/31/2020   Left testicular pain 10/31/2020   Myalgia 08/26/2020   Burning sensation of skin 08/26/2020   Chronic pain disorder 08/26/2020   Pain and swelling of elbow, left 08/04/2018  Shortness of breath 07/05/2018   Allergic rhinitis 04/20/2018   Nasal  congestion 05/19/2017   Severe obesity (BMI 35.0-39.9) with comorbidity ([place]) 01/07/2017   Encounter for chronic pain management 10/11/2016   Chronic fatigue 03/20/2016   Narcolepsy    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   Bunion 10/22/2015   Cough 10/22/2015   Medicare annual wellness visit, subsequent 08/14/2015   Health maintenance examination 08/14/2015   Advanced care planning/counseling discussion 08/14/2015   Thrombocytopenia ([place]) 08/05/2015   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   History of ankle fracture 07/19/2013   Drooping eyelid 03/09/2013   Leg edema 02/16/2013   Bradycardia 02/16/2013   Erectile dysfunction 09/14/2012   Gout 09/13/2012   GERD (gastroesophageal reflux disease)    HTN (hypertension)    HLD (hyperlipidemia)    OSA (obstructive sleep apnea)    RLS (restless legs syndrome)    [name] disease    Narcotic dependence ([place])    Systolic murmur     Immunization History  Administered Date(s) Administered   Fluad Quad(high Dose 65+) 04/06/2019   Influenza, Seasonal, Injecte, Preservative Fre 08/02/2012, 04/26/2013   Influenza,inj,Quad PF,6+ Mos 04/30/2014, 08/14/2015, 03/24/2016, 05/19/2017, 04/20/2018   Influenza-Unspecified 04/30/2014, 08/14/2015, 03/24/2016, 05/19/2017, 04/20/2018   Janssen (J&J) SARS-COV-2 Vaccination 11/01/2019   Pneumococcal Conjugate-13 01/07/2017   Pneumococcal Polysaccharide-23 07/01/2018   Td 07/27/2012   Zoster, Live 04/26/2013    Conditions to be addressed/monitored:  Hypertension and Hyperlipidemia   Patient Care Plan: CCM Pharmacy Care Plan     Problem Identified: CHL AMB "PATIENT-SPECIFIC PROBLEM"      Long-Range Goal: Disease Management   Start Date: 04/29/2021  Priority: High  Note:    Current Barriers:  Unable to achieve control of blood pressure  Untreated sleep apnea   Pharmacist Clinical Goal(s):  Patient will contact provider office for questions/concerns as evidenced notation of same in electronic health record through  collaboration with PharmD and provider.   Interventions: 1:1 collaboration with Randy Bush, MD regarding development and update of comprehensive plan of care as evidenced by provider attestation and co-signature Inter-disciplinary care team collaboration (see longitudinal plan of care) Comprehensive medication review performed; medication list updated in electronic medical record  Hypertension (BP goal <140/90) -Not ideally Controlled - home BP 170/70s on home readings and 170/75 at last office visit (dentist) -See cardio about once a year, last visit with Dr. Rockey Situ (08/2020), increased Lasix to TID. PCP switched losartan to valsartan April 2022. Multiple med intolerances. Bradycardia limits options as well. -Current treatment: Hydralazine 100 mg - 1 and 1/2 tablet TID (up to QID) Valsartan 320 mg - 1 tablet daily (morning) Lasix 20 mg - 1 tablet three days per week (M,W,F) -Medications previously tried: hctz (Gout), amlodipine (swelling), spironolactone (dizziness, rash), doxazosin (dizziness), metoprolol (bradycardia) -Current home readings: he has a home BP monitor (arm cuff), running ~170/75 at home (checked twice in the past month). Pt reports he has verified accuracy at office. HR running 43-45, increases to 50 mid-day (reports this is normal for him). -Reports family history of HTN. He has sleep apnea - sleeps in recliner, unable to wear  CPAP due to rash/allergic reaction. Reports failed multiple sleep studies due to no REM sleep. He has been diagnosed with narcolepsy. Reports he tried the CPAP off and on for about a year and unable to tolerate due to allergic reaction. States he even tried maskless CPAP. Naps most afternoons. He takes gabapentin at night and it makes him drowsy. No issue falling asleep. Sleeps from 2 AM -  10 AM. States there are a lot of nights he does not sleep at all due to thinking (not anxiety) just thinking. Previously tried Ambien and did not like it due to  grogginess. He declines interested in referral to sleep specialist today. -Current dietary habits: adds a lot table salt -Current exercise habits: Chases grandchildren around 3-4 days week -No alcohol or tobacco.  -Reports his weight fluctuates between 210-230.  -Denies hypotensive/hypertensive symptoms.  No dizziness, headaches, chest pain. -Educated on BP goals and benefits of medications for prevention of heart attack, stroke and kidney damage; -Counseled to monitor BP at home weekly, document, and provide log at future appointments -Counseled on diet and exercise extensively; Recommend avoiding table salt and limiting the salty six. Discussed importance of treatment of sleep apnea in BP control - pt declines further sleep study or referral to sleep specialist. Follow up 1 month for BP log and diet review. Limit NSAID use.  Hyperlipidemia: (LDL goal < 100) -Controlled  -Pt affirms adherence daily in evening -Current treatment: Pravastatin 40 mg - 1 tablet daily -Medications previously tried: none  -Educated on Cholesterol goals;  Exercise goal of 150 minutes per week; -Recommended to continue current medication  Other -  No gout flare in 8 months Got an hearing aid, works well  Gabapentin - unable to take during day due to drowsiness Takes 3 gabapentin every night Hydrocodone/APAP - takes TID Uses ibuprofen twice a day since dentist appt, usually takes about once/week Uses meloxicam and prednisone PRN gout flare up (takes 1 of each once daily until flare resolves) Nitroglycerin - has not used recently (> 6 months)  Patient Goals/Self-Care Activities Patient will:  - take medications as prescribed check blood pressure weekly, document, and provide at future appointments  Follow Up Plan: Telephone follow up appointment with care management team member scheduled for:  30 days  - lifestyle review for HTN control    Medication Assistance: None required.  Patient affirms current  coverage meets needs.  Compliance/Adherence/Medication fill history: Care Gaps: Last BP > 140/90  Star-Rating Drugs: Medication:                Last Fill:         Day Supply Pravastatin 20m                    04/16/21            90 Valsartan 3231m                    04/16/21            90  Patient's preferred pharmacy is:  [facility] [place] [address] [address] [place] [place]one: [phone]: [phone] [address]: [phone]: [phone] pill box? No Pt endorses 100% compliance - reports he set an alarm on phone for hydralazine mid-day He reports he has 9 daily including aspirin. OTCs - vitamin C and MVI  Care Plan and Follow Up Patient Decision:  Patient agrees to Care Plan and Follow-up.  MiDebbora DusPharmD Clinical Pharmacist [phone]

## 2021-05-12 ENCOUNTER — Telehealth: Payer: Self-pay

## 2021-05-12 NOTE — Progress Notes (Signed)
Refill request for Gabapentin sent in today to [facility].  [name], CPP notified  [name], [place] Assistant [phone]  Total time spent for month CPA: 10 min

## 2021-05-14 ENCOUNTER — Other Ambulatory Visit: Payer: Self-pay | Admitting: Family Medicine

## 2021-05-14 NOTE — Telephone Encounter (Signed)
Name of Medication: Hydrocodone-APAP Name of Pharmacy: [facility] Last Fill or Written Date and Quantity: 04/21/21, #90 Last Office Visit and Type: 10/28/20, AWV Next Office Visit and Type: none Last Controlled Substance Agreement Date: 09/29/16 Last UDS: 03/10/19

## 2021-05-16 NOTE — Telephone Encounter (Signed)
ERx 

## 2021-05-16 NOTE — Telephone Encounter (Signed)
Hydralazine was refilled by [name] on 01/12/2021 for #150 with 11 refills.

## 2021-05-26 DIAGNOSIS — E782 Mixed hyperlipidemia: Secondary | ICD-10-CM

## 2021-05-26 DIAGNOSIS — I10 Essential (primary) hypertension: Secondary | ICD-10-CM | POA: Diagnosis not present

## 2021-05-29 ENCOUNTER — Telehealth: Payer: Self-pay

## 2021-05-29 NOTE — Chronic Care Management (AMB) (Signed)
    Chronic Care Management Pharmacy Assistant   Name: Randy Kennedy  MRN: [id number] DOB: 1950/08/08   Reason for Encounter: Reminder Call   Conditions to be addressed/monitored: HTN and HLD    Medications: Outpatient Encounter Medications as of 05/29/2021  Medication Sig   aspirin EC 81 MG tablet Take 81 mg by mouth daily.   colchicine 0.6 MG tablet TAKE ONE TABLET BY MOUTH DAILY   fexofenadine (ALLEGRA) 180 MG tablet Take 180 mg by mouth daily.   furosemide (LASIX) 20 MG tablet Take 1 tablet (20 mg total) by mouth 3 (three) times a week. (Patient taking differently: Take 20 mg by mouth 3 (three) times a week. M,W,F)   gabapentin (NEURONTIN) 300 MG capsule Take 1 capsule (300 mg total) by mouth 2 (two) times daily AND 2 capsules (600 mg total) at bedtime.   hydrALAZINE (APRESOLINE) 100 MG tablet TAKE 1 AND 1/2 TABLETS BY MOUTH THREE TIMES DAILY, MAY increase TO FOUR TIMES DAILY AS NEEDED FOR HIGH BLOOD PRESSURE   HYDROcodone-acetaminophen (NORCO) 10-325 MG tablet TAKE ONE TABLET BY MOUTH EVERY 6 HOURS AS NEEDED   meloxicam (MOBIC) 15 MG tablet TAKE 1 TABLET BY MOUTH ONCE DAILY AS NEEDED FOR  GOUT  FLARE   nitroGLYCERIN (NITROSTAT) 0.4 MG SL tablet Place 1 tablet (0.4 mg total) under the tongue every 5 (five) minutes as needed for chest pain.   pravastatin (PRAVACHOL) 40 MG tablet TAKE ONE TABLET BY MOUTH ONCE DAILY   predniSONE (DELTASONE) 20 MG tablet Take 1 tablet (20 mg total) by mouth daily. For 1-2 days prn gout flare   probenecid (BENEMID) 500 MG tablet TAKE 1/2 TABLET BY MOUTH TWICE DAILY   valsartan (DIOVAN) 320 MG tablet Take 1 tablet (320 mg total) by mouth daily.   vitamin C (ASCORBIC ACID) 500 MG tablet Take 500 mg by mouth daily.   No facility-administered encounter medications on file as of 05/29/2021.   [name] did not answer the telephone to remind him of his upcoming telephone visit with [name] on 06/02/21 at 3:00pm. Patient was reminded to  have all medications, supplements and any blood glucose and blood pressure readings available for review at appointment. Detailed message left on machine    Star Rating Drugs: Medication:   Last Fill: Day Supply Pravastatin 40mg                     04/16/21            90 Valsartan 320mg                      04/16/21            [place], CPP notified  [name], [place] Assistant [phone]  Total time spent for month CPA: 10 min.

## 2021-06-02 ENCOUNTER — Telehealth: Payer: Self-pay

## 2021-06-02 ENCOUNTER — Telehealth: Payer: PPO

## 2021-06-02 ENCOUNTER — Encounter: Payer: Self-pay | Admitting: Family Medicine

## 2021-06-02 NOTE — Telephone Encounter (Signed)
Contacted patient for CCM visit. He has not had a chance to check home BP this month so we will reschedule for 2 weeks. Advised to limit salt intake. He has not taken NSAIDs recently, rare Advil use.   He also inquired about follow up on referral for dermatology that was placed in April 2022. States he needs to see dermatology due to possible skin cancer on his ear and has not heard anything about scheduling.  [name], PharmD Clinical Pharmacist [facility] at [facility] [phone]

## 2021-06-02 NOTE — Progress Notes (Deleted)
Chronic Care Management Pharmacy Note  06/02/2021 Name:  Randy Kennedy MRN:  [id number] DOB:  1951-04-28  Summary: Discussed HTN and HLD. Pt is adherent to medications as prescribed. BP is elevated. His home BP has been running 170s/70 on home check about twice/month. Denies s/s of HTN. He does admit to high salt intake. He has failed CPAP therapy, discussed in detail. Erratic sleep schedule. Declines interest in sleep specialist. LDL controlled on current therapy.  Recommendations/Changes made from today's visit: Recommended sleep specialist. Patient politely declined due to multiple failed sleep studies in the past. Recommend limiting salt and increasing activity for HTN. Discussed sleep hygiene.   Plan: CCM follow up 1 month - review salt intake/BP log   Subjective: Randy Kennedy is an 70 y.o. year old male who is a primary patient of [name], MD.  The CCM team was consulted for assistance with disease management and care coordination needs.    Engaged with patient by telephone for follow up visit in response to provider referral for pharmacy case management and/or care coordination services.   Consent to Services:  The patient was given information about Chronic Care Management services, agreed to services, and gave verbal consent prior to initiation of services.  Please see initial visit note for detailed documentation.   Patient Care Team: [name], MD as PCP - General (Family Medicine) [name], [facility] as Pharmacist (Pharmacist)  Recent office visits: 03/28/21 - [name], NP - Pt presented for pain and swelling. X-ray today. Follow up symptoms do not improve or worsen. 10/28/20 Danise Mina, MD, PCP - Pt presented for HTN. Chronic, uncontrolled. Multiple drug intolerances including lisinopril/hctz, amlodipine, spironolactone and doxazosin. Complaint with current regimen of losartan and hydralazine. Will change losartan to valsartan for more potent  ARB. Update with effect after monitoring at home for 2-3 wks. Refer to audiologist. Return in 3 months.  Recent consult visits: 12/17/20 - Colonoscopy -  polyps removed  Hospital visits: None in previous 6 months   Objective: Lab Results  Component Value Date   CREATININE 1.25 03/28/2021   BUN 16 03/28/2021   GFR 58.42 (L) 03/28/2021   GFRNONAA >60 10/04/2017   GFRAA >60 10/04/2017   NA 142 03/28/2021   K 4.0 03/28/2021   CALCIUM 9.5 03/28/2021   CO2 30 03/28/2021   GLUCOSE 111 (H) 03/28/2021    Lab Results  Component Value Date/Time   HGBA1C 5.6 10/24/2020 08:54 AM   HGBA1C 5.3 08/22/2019 08:01 AM   GFR 58.42 (L) 03/28/2021 09:33 AM   GFR 69.83 10/24/2020 08:54 AM     Lab Results  Component Value Date   CHOL 186 10/24/2020   HDL 76.90 10/24/2020   LDLCALC 76 10/24/2020   LDLDIRECT 90.0 07/01/2018   TRIG 165.0 (H) 10/24/2020   CHOLHDL 2 10/24/2020    Hepatic Function Latest Ref Rng & Units 03/28/2021 08/26/2020 08/22/2019  Total Protein 6.0 - 8.3 g/dL 7.5 7.1 7.2  Albumin 3.5 - 5.2 g/dL 4.4 4.3 4.5  AST 0 - 37 U/L '26 20 18  ' ALT 0 - 53 U/L 33 26 17  Alk Phosphatase 39 - 117 U/L 56 69 76  Total Bilirubin 0.2 - 1.2 mg/dL 1.2 1.1 0.8    Lab Results  Component Value Date/Time   TSH 2.82 08/26/2020 09:51 AM   TSH 0.80 03/24/2016 12:01 PM    CBC Latest Ref Rng & Units 03/28/2021 08/26/2020 08/22/2019  WBC 4.0 - 10.5 K/uL 6.4 6.0 5.6  Hemoglobin  13.0 - 17.0 g/dL 14.8 14.6 14.8  Hematocrit 39.0 - 52.0 % 43.0 42.6 42.8  Platelets 150.0 - 400.0 K/uL 143.0(L) 129.0(L) 145.0(L)    No results found for: VD25OH  Clinical ASCVD: No  The 10-year ASCVD risk score (Arnett DK, et al., 2019) is: 26.3%   Values used to calculate the score:     Age: 53 years     Sex: Male     Is Non-Hispanic African American: No     Diabetic: No     Tobacco smoker: No     Systolic Blood Pressure: 093 mmHg     Is BP treated: Yes     HDL Cholesterol: 76.9 mg/dL     Total Cholesterol: 186  mg/dL    Depression screen [facility] 2/9 10/28/2020 10/01/2020 08/23/2019  Decreased Interest 0 0 0  Down, Depressed, Hopeless 0 0 0  PHQ - 2 Score 0 0 0  Altered sleeping 3 0 0  Tired, decreased energy 2 0 0  Change in appetite 0 0 0  Feeling bad or failure about yourself  0 0 0  Trouble concentrating 0 0 0  Moving slowly or fidgety/restless 0 0 0  Suicidal thoughts 0 0 0  PHQ-9 Score 5 0 0  Difficult doing work/chores - Not difficult at all Not difficult at all  Some recent data might be hidden    Social History   Tobacco Use  Smoking Status Former   Packs/day: 1.00   Years: 4.00   Pack years: 4.00   Types: Pipe, Cigarettes   Quit date: 07/27/1978   Years since quitting: 42.8  Smokeless Tobacco Never   BP Readings from Last 3 Encounters:  03/28/21 (!) 170/74  12/17/20 (!) 141/76  12/10/20 138/65   Pulse Readings from Last 3 Encounters:  03/28/21 (!) 59  12/17/20 (!) 44  12/10/20 (!) 44   Wt Readings from Last 3 Encounters:  03/28/21 236 lb 12 oz (107.4 kg)  12/17/20 215 lb (97.5 kg)  12/10/20 225 lb (102.1 kg)   BMI Readings from Last 3 Encounters:  03/28/21 37.64 kg/m  12/17/20 33.67 kg/m  12/10/20 35.24 kg/m    Assessment/Interventions: Review of patient past medical history, allergies, medications, health status, including review of consultants reports, laboratory and other test data, was performed as part of comprehensive evaluation and provision of chronic care management services.   SDOH:  (Social Determinants of Health) assessments and interventions performed: Yes   SDOH Screenings   Alcohol Screen: Low Risk    Last Alcohol Screening Score (AUDIT): 0  Depression (PHQ2-9): Medium Risk   PHQ-2 Score: 5  Financial Resource Strain: Low Risk    Difficulty of Paying Living Expenses: Not very hard  Food Insecurity: No Food Insecurity   Worried About Charity fundraiser in the Last Year: Never true   Ran Out of Food in the Last Year: Never true  Housing: Low  Risk    Last Housing Risk Score: 0  Physical Activity: Inactive   Days of Exercise per Week: 0 days   Minutes of Exercise per Session: 0 min  Social Connections: Not on file  Stress: No Stress Concern Present   Feeling of Stress : Not at all  Tobacco Use: Medium Risk   Smoking Tobacco Use: Former   Smokeless Tobacco Use: Never   Passive Exposure: Not on file  Transportation Needs: No Transportation Needs   Lack of Transportation (Medical): No   Lack of Transportation (Non-Medical): No  CCM Care Plan  Allergies  Allergen Reactions   Allopurinol Other (See Comments)    GI upset   Lisinopril-Hydrochlorothiazide     Burning in feet.   Procardia [Nifedipine] Other (See Comments)    Bradycardia, leg swelling   Spironolactone Other (See Comments)    malaise, dizziness   Amlodipine Rash    BLE   Doxazosin Mesylate Rash   Latex Rash   Nortriptyline Rash   Uloric [Febuxostat] Rash    Medications Reviewed Today     Reviewed by [name], [facility] (Pharmacist) on 04/29/21 at [place] List Status: <None>   Medication Order Taking? Sig Documenting Provider Last Dose Status Informant  aspirin EC 81 MG tablet [id number] Yes Take 81 mg by mouth daily. [provider] Taking Active   colchicine 0.6 MG tablet [id number] Yes TAKE ONE TABLET BY MOUTH DAILY [name], MD Taking Active   fexofenadine [facility]) 180 MG tablet [id number] Yes Take 180 mg by mouth daily. [provider] Taking Active   furosemide (LASIX) 20 MG tablet [id number] Yes Take 1 tablet (20 mg total) by mouth 3 (three) times a week.  Patient taking differently: Take 20 mg by mouth 3 (three) times a week. M,W,F   Minna Merritts, MD Taking Active Self  gabapentin (NEURONTIN) 300 MG capsule [id number] Yes Take 1 capsule (300 mg total) by mouth 2 (two) times daily AND 2 capsules (600 mg total) at bedtime. [name], MD Taking Active   hydrALAZINE (APRESOLINE) 100 MG tablet [id number] Yes  TAKE 1 AND 1/2 TABLETS BY MOUTH THREE TIMES DAILY, MAY increase TO FOUR TIMES DAILY AS NEEDED FOR HIGH BLOOD PRESSURE [name], MD Taking Active   HYDROcodone-acetaminophen [facility]) 10-325 MG tablet [id number] Yes TAKE ONE TABLET BY MOUTH EVERY 6 HOURS AS NEEDED [name], MD Taking Active   meloxicam (MOBIC) 15 MG tablet [id number] Yes TAKE 1 TABLET BY MOUTH ONCE DAILY AS NEEDED FOR  GOUT  FLARE [name], MD Taking Active   nitroGLYCERIN (NITROSTAT) 0.4 MG SL tablet [id number] Yes Place 1 tablet (0.4 mg total) under the tongue every 5 (five) minutes as needed for chest pain. Minna Merritts, MD Taking Active   pravastatin (PRAVACHOL) 40 MG tablet [id number] Yes TAKE ONE TABLET BY MOUTH ONCE DAILY [name], MD Taking Active   predniSONE (DELTASONE) 20 MG tablet [id number] Yes Take 1 tablet (20 mg total) by mouth daily. For 1-2 days prn gout flare [name], MD Taking Active   probenecid (BENEMID) 500 MG tablet [id number] Yes TAKE 1/2 TABLET BY MOUTH TWICE DAILY [name], MD Taking Active   valsartan (DIOVAN) 320 MG tablet [id number] Yes Take 1 tablet (320 mg total) by mouth daily. [name], MD Taking Active   vitamin C (ASCORBIC ACID) 500 MG tablet [id number] Yes Take 500 mg by mouth daily. [provider] Taking Active             Patient Active Problem List   Diagnosis Date Noted   Pain and swelling of left ankle 03/28/2021   Family history of colon cancer in mother    Occult blood positive stool    Hearing loss 10/31/2020   Open wound of right external ear 10/31/2020   Left testicular pain 10/31/2020   Myalgia 08/26/2020   Burning sensation of skin 08/26/2020   Chronic pain disorder 08/26/2020   Pain and swelling of elbow, left 08/04/2018   Shortness of breath 07/05/2018   Allergic rhinitis 04/20/2018   Nasal congestion  05/19/2017   Severe obesity (BMI 35.0-39.9) with comorbidity ([place]) 01/07/2017   Encounter for chronic  pain management 10/11/2016   Chronic fatigue 03/20/2016   Narcolepsy    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   Bunion 10/22/2015   Cough 10/22/2015   Medicare annual wellness visit, subsequent 08/14/2015   Health maintenance examination 08/14/2015   Advanced care planning/counseling discussion 08/14/2015   Thrombocytopenia ([place]) 08/05/2015   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   History of ankle fracture 07/19/2013   Drooping eyelid 03/09/2013   Leg edema 02/16/2013   Bradycardia 02/16/2013   Erectile dysfunction 09/14/2012   Gout 09/13/2012   GERD (gastroesophageal reflux disease)    HTN (hypertension)    HLD (hyperlipidemia)    OSA (obstructive sleep apnea)    RLS (restless legs syndrome)    [name] disease    Narcotic dependence ([place])    Systolic murmur     Immunization History  Administered Date(s) Administered   Fluad Quad(high Dose 65+) 04/06/2019   Influenza, Seasonal, Injecte, Preservative Fre 08/02/2012, 04/26/2013   Influenza,inj,Quad PF,6+ Mos 04/30/2014, 08/14/2015, 03/24/2016, 05/19/2017, 04/20/2018   Influenza-Unspecified 04/30/2014, 08/14/2015, 03/24/2016, 05/19/2017, 04/20/2018   Janssen (J&J) SARS-COV-2 Vaccination 11/01/2019   Pneumococcal Conjugate-13 01/07/2017   Pneumococcal Polysaccharide-23 07/01/2018   Td 07/27/2012   Zoster, Live 04/26/2013    Conditions to be addressed/monitored:  Hypertension and Hyperlipidemia   Patient Care Plan: CCM Pharmacy Care Plan     Problem Identified: CHL AMB "PATIENT-SPECIFIC PROBLEM"      Long-Range Goal: Disease Management   Start Date: 04/29/2021  Priority: High  Note:    Current Barriers:  Unable to achieve control of blood pressure  Untreated sleep apnea   Pharmacist Clinical Goal(s):  Patient will contact provider office for questions/concerns as evidenced notation of same in electronic health record through collaboration with PharmD and provider.   Interventions: 1:1 collaboration with [name], MD regarding  development and update of comprehensive plan of care as evidenced by provider attestation and co-signature Inter-disciplinary care team collaboration (see longitudinal plan of care) Comprehensive medication review performed; medication list updated in electronic medical record  Hypertension (BP goal <140/90) -Not ideally Controlled - home BP 170/70s on home readings and 170/75 at last office visit (dentist) -See cardio about once a year, last visit with Dr. Rockey Situ (08/2020), increased Lasix to TID. PCP switched losartan to valsartan April 2022. Multiple med intolerances. Bradycardia limits options as well. -Current treatment: Hydralazine 100 mg - 1 and 1/2 tablet TID (up to QID) Valsartan 320 mg - 1 tablet daily (morning) Lasix 20 mg - 1 tablet three days per week (M,W,F) -Medications previously tried: hctz (Gout), amlodipine (swelling), spironolactone (dizziness, rash), doxazosin (dizziness), metoprolol (bradycardia) -Current home readings: he has a home BP monitor (arm cuff), running ~170/75 at home (checked twice in the past month). Pt reports he has verified accuracy at office. HR running 43-45, increases to 50 mid-day (reports this is normal for him). -Reports family history of HTN. He has sleep apnea - sleeps in recliner, unable to wear  CPAP due to rash/allergic reaction. Reports failed multiple sleep studies due to no REM sleep. He has been diagnosed with narcolepsy. Reports he tried the CPAP off and on for about a year and unable to tolerate due to allergic reaction. States he even tried maskless CPAP. Naps most afternoons. He takes gabapentin at night and it makes him drowsy. No issue falling asleep. Sleeps from 2 AM - 10 AM. States there are a lot of nights he does not sleep  at all due to thinking (not anxiety) just thinking. Previously tried Ambien and did not like it due to grogginess. He declines interested in referral to sleep specialist today. -Current dietary habits: adds a lot table  salt -Current exercise habits: Chases grandchildren around 3-4 days week -No alcohol or tobacco.  -Reports his weight fluctuates between 210-230.  -Denies hypotensive/hypertensive symptoms.  No dizziness, headaches, chest pain. -Educated on BP goals and benefits of medications for prevention of heart attack, stroke and kidney damage; -Counseled to monitor BP at home weekly, document, and provide log at future appointments -Counseled on diet and exercise extensively; Recommend avoiding table salt and limiting the salty six. Discussed importance of treatment of sleep apnea in BP control - pt declines further sleep study or referral to sleep specialist. Follow up 1 month for BP log and diet review. Limit NSAID use.  Hyperlipidemia: (LDL goal < 100) -Controlled  -Pt affirms adherence daily in evening -Current treatment: Pravastatin 40 mg - 1 tablet daily -Medications previously tried: none  -Educated on Cholesterol goals;  Exercise goal of 150 minutes per week; -Recommended to continue current medication  Other -  No gout flare in 8 months Got an hearing aid, works well  Gabapentin - unable to take during day due to drowsiness Takes 3 gabapentin every night Hydrocodone/APAP - takes TID Uses ibuprofen twice a day since dentist appt, usually takes about once/week Uses meloxicam and prednisone PRN gout flare up (takes 1 of each once daily until flare resolves) Nitroglycerin - has not used recently (> 6 months)  Patient Goals/Self-Care Activities Patient will:  - take medications as prescribed check blood pressure weekly, document, and provide at future appointments  Follow Up Plan: Telephone follow up appointment with care management team member scheduled for:  30 days  - lifestyle review for HTN control    Medication Assistance: None required.  Patient affirms current coverage meets needs.  Compliance/Adherence/Medication fill history: Care Gaps: Last BP > 140/90  Star-Rating  Drugs: Medication:                Last Fill:         Day Supply Pravastatin 54m                    04/16/21            90 Valsartan 3268m                    04/16/21            90  Patient's preferred pharmacy is:  [facility] [place] [address] [place]one: [phone]: [phone]: [phone]: [phone] pill box? No Pt endorses 100% compliance - reports he set an alarm on phone for hydralazine mid-day He reports he has 9 daily including aspirin. OTCs - vitamin C and MVI  Care Plan and Follow Up Patient Decision:  Patient agrees to Care Plan and Follow-up.  MiDebbora DusPharmD Clinical Pharmacist [facility] MiDebbora DusPharmD Clinical Pharmacist [phone]

## 2021-06-02 NOTE — Telephone Encounter (Signed)
Referral was sent to Dr Evorn Gong - [facility] PA: [name] MD Address: [address], [place], [place] [address] Phone: [phone]   Referral was sent via Prof[facility] to Dr Evorn Gong office 10/31/20 and it looks like the referral was declined and no one notified our office. They tried to reach him to schedule his appt and he never called them back.   "Rejection Reason - Other - REFERRAL SENT IN [place], NEVER HEARD BK FROM PT" Randy Kennedy said on Apr 22, 2021 10:04 AM  I am not sure how many times they tried to call him.   The referral is still good, does not expire until 10/2021.   He can call to schedule.  I will re-fax the referral since its been so long. I also updated referral in Prof[employer] so that we can track it.

## 2021-06-02 NOTE — Telephone Encounter (Addendum)
Referral was sent to Dr Evorn Gong - [facility] PA: [name] MD Address: [address], [place], [place] [address] Phone: [phone]   Referral was sent via Prof[facility] to Dr Evorn Gong office 10/31/20 and it looks like the referral was declined and no one notified our office. They tried to reach him to schedule his appt and he never called them back.   "Rejection Reason - Other - REFERRAL SENT IN APRIL, NEVER HEARD BK FROM PT" Randy Kennedy said on Apr 22, 2021 10:04 AM  I am not sure how many times they tried to call him.   The referral is still good, does not expire until 10/2021.   He can call to schedule.  I will re-fax the referral since its been so long.

## 2021-06-04 ENCOUNTER — Other Ambulatory Visit: Payer: Self-pay | Admitting: Family Medicine

## 2021-06-04 DIAGNOSIS — D225 Melanocytic nevi of trunk: Secondary | ICD-10-CM | POA: Diagnosis not present

## 2021-06-04 DIAGNOSIS — D2262 Melanocytic nevi of left upper limb, including shoulder: Secondary | ICD-10-CM | POA: Diagnosis not present

## 2021-06-04 DIAGNOSIS — D2272 Melanocytic nevi of left lower limb, including hip: Secondary | ICD-10-CM | POA: Diagnosis not present

## 2021-06-04 DIAGNOSIS — L821 Other seborrheic keratosis: Secondary | ICD-10-CM | POA: Diagnosis not present

## 2021-06-04 DIAGNOSIS — C44212 Basal cell carcinoma of skin of right ear and external auricular canal: Secondary | ICD-10-CM | POA: Diagnosis not present

## 2021-06-04 DIAGNOSIS — D485 Neoplasm of uncertain behavior of skin: Secondary | ICD-10-CM | POA: Diagnosis not present

## 2021-06-04 DIAGNOSIS — D2261 Melanocytic nevi of right upper limb, including shoulder: Secondary | ICD-10-CM | POA: Diagnosis not present

## 2021-06-04 DIAGNOSIS — D2271 Melanocytic nevi of right lower limb, including hip: Secondary | ICD-10-CM | POA: Diagnosis not present

## 2021-06-05 NOTE — Telephone Encounter (Signed)
Name of Medication: Hydrocodone-APAP Name of Pharmacy: [facility] Last Fill or Written Date and Quantity: 05/19/21, #90 Last Office Visit and Type: 10/28/20, AWV prt 2 Next Office Visit and Type: none Last Controlled Substance Agreement Date: 09/29/16 Last UDS: 03/10/19

## 2021-06-06 NOTE — Telephone Encounter (Signed)
ERx 

## 2021-06-09 ENCOUNTER — Telehealth: Payer: Self-pay

## 2021-06-09 NOTE — Progress Notes (Signed)
    Chronic Care Management Pharmacy Assistant   Name: HOBART MARTE  MRN: [id number] DOB: [birth year]  Reason for Encounter: CCM (Appointment Reminder)   Medications: Outpatient Encounter Medications as of 06/09/2021  Medication Sig   HYDROcodone-acetaminophen (NORCO) 10-325 MG tablet TAKE ONE TABLET BY MOUTH EVERY 6 HOURS AS NEEDED   aspirin EC 81 MG tablet Take 81 mg by mouth daily.   colchicine 0.6 MG tablet TAKE ONE TABLET BY MOUTH DAILY   fexofenadine (ALLEGRA) 180 MG tablet Take 180 mg by mouth daily.   furosemide (LASIX) 20 MG tablet Take 1 tablet (20 mg total) by mouth 3 (three) times a week. (Patient taking differently: Take 20 mg by mouth 3 (three) times a week. M,W,F)   gabapentin (NEURONTIN) 300 MG capsule Take 1 capsule (300 mg total) by mouth 2 (two) times daily AND 2 capsules (600 mg total) at bedtime.   hydrALAZINE (APRESOLINE) 100 MG tablet TAKE 1 AND 1/2 TABLETS BY MOUTH THREE TIMES DAILY, MAY increase TO FOUR TIMES DAILY AS NEEDED FOR HIGH BLOOD PRESSURE   meloxicam (MOBIC) 15 MG tablet TAKE 1 TABLET BY MOUTH ONCE DAILY AS NEEDED FOR  GOUT  FLARE   nitroGLYCERIN (NITROSTAT) 0.4 MG SL tablet Place 1 tablet (0.4 mg total) under the tongue every 5 (five) minutes as needed for chest pain.   pravastatin (PRAVACHOL) 40 MG tablet TAKE ONE TABLET BY MOUTH ONCE DAILY   predniSONE (DELTASONE) 20 MG tablet Take 1 tablet (20 mg total) by mouth daily. For 1-2 days prn gout flare   probenecid (BENEMID) 500 MG tablet TAKE 1/2 TABLET BY MOUTH TWICE DAILY   valsartan (DIOVAN) 320 MG tablet Take 1 tablet (320 mg total) by mouth daily.   vitamin C (ASCORBIC ACID) 500 MG tablet Take 500 mg by mouth daily.   No facility-administered encounter medications on file as of 06/09/2021.   Left a message for SALIF TAY to remind him of his upcoming telephone visit with [name] on 06/16/2021 at 1:00 pm. Patient was reminded to have all medications, supplements and any blood  glucose and blood pressure readings available for review at appointment.  Star Rating Drugs: Medication:  Last Fill: Day Supply Pravastatin 40 mg 09/21/20225 90 Valsartan 320 mg 04/16/2021 [place], CPP notified  Marijean Niemann, [place] Clinical Pharmacy Assistant [phone]  Time Spent: 10 Minutes

## 2021-06-09 NOTE — Telephone Encounter (Signed)
Please call to ensure patient has schedule dermatology appointment.

## 2021-06-09 NOTE — Telephone Encounter (Signed)
Lvm asking pt to call back.  Need to know if he scheduled derm appt.  Also, sent MyChart message.

## 2021-06-16 ENCOUNTER — Ambulatory Visit (INDEPENDENT_AMBULATORY_CARE_PROVIDER_SITE_OTHER): Payer: PPO

## 2021-06-16 ENCOUNTER — Telehealth: Payer: Self-pay

## 2021-06-16 DIAGNOSIS — I10 Essential (primary) hypertension: Secondary | ICD-10-CM

## 2021-06-16 DIAGNOSIS — E782 Mixed hyperlipidemia: Secondary | ICD-10-CM

## 2021-06-16 NOTE — Telephone Encounter (Signed)
Patients blood pressure remains elevated. He has checked at home for the past week. The lowest reading he recorded was 178/76. Highest 211/93. States its running 200/80s most evenings. 170s/80s during the day. He is taking all meds as prescribed. He is not using the PRN 4th dose hydralazine - encouraged him to take this in evenings.  Denies abnormal symptoms. He has headache today which he attributes to sinus drainage. I wonder if we need to retrial some of his medication intolerances - particularly low dose amlodipine (prior BLE but now on Lasix could help?). Pt also plans to work on weight loss.  Current treatment: Hydralazine 100 mg - 1 and 1/2 tablet TID (up to QID)  Valsartan 320 mg - 1 tablet daily (morning) Lasix 20 mg - 1 tablet three days per week (M,W,F) (unless going somewhere) most of the time MWF   [name], PharmD Clinical Pharmacist [facility] at [facility] [phone]

## 2021-06-16 NOTE — Progress Notes (Signed)
Chronic Care Management Pharmacy Note  06/17/2021 Name:  Randy Kennedy MRN:  [id number] DOB:  Sep 13, 1950  Summary: Follow up on HTN. Home BP remains elevated - see telephone note 06/16/21  Recommendations/Changes made from today's visit: Take fourth dose hydralazine in evening for BP readings > 140/90  Plan: Collaborate with PCP for medication management - consider amlodipine retrial (see phone note) Patient would like to focus on weight loss with calorie restrictive diet   Subjective: Randy Kennedy is an 70 y.o. year old male who is a primary patient of Randy Bush, MD.  The CCM team was consulted for assistance with disease management and care coordination needs.    Engaged with patient by telephone for follow up visit in response to provider referral for pharmacy case management and/or care coordination services.   Consent to Services:  The patient was given information about Chronic Care Management services, agreed to services, and gave verbal consent prior to initiation of services.  Please see initial visit note for detailed documentation.   Patient Care Team: Randy Bush, MD as PCP - General (Family Medicine) Randy Kennedy, [facility] as Pharmacist (Pharmacist)  Recent office visits: 03/28/21 - Randy Ito, NP - Pt presented for pain and swelling. X-ray today. Follow up symptoms do not improve or worsen. 10/28/20 Randy Mina, MD, PCP - Pt presented for HTN. Chronic, uncontrolled. Multiple drug intolerances including lisinopril/hctz, amlodipine, spironolactone and doxazosin. Complaint with current regimen of losartan and hydralazine. Will change losartan to valsartan for more potent ARB. Update with effect after monitoring at home for 2-3 wks. Refer to audiologist. Return in 3 months.  Recent consult visits: 12/17/20 - Colonoscopy -  polyps removed  Hospital visits: None in previous 6 months   Objective: Lab Results  Component Value Date   CREATININE  1.25 03/28/2021   BUN 16 03/28/2021   GFR 58.42 (L) 03/28/2021   GFRNONAA >60 10/04/2017   GFRAA >60 10/04/2017   NA 142 03/28/2021   K 4.0 03/28/2021   CALCIUM 9.5 03/28/2021   CO2 30 03/28/2021   GLUCOSE 111 (H) 03/28/2021   Lab Results  Component Value Date/Time   HGBA1C 5.6 10/24/2020 08:54 AM   HGBA1C 5.3 08/22/2019 08:01 AM   GFR 58.42 (L) 03/28/2021 09:33 AM   GFR 69.83 10/24/2020 08:54 AM     Lab Results  Component Value Date   CHOL 186 10/24/2020   HDL 76.90 10/24/2020   LDLCALC 76 10/24/2020   LDLDIRECT 90.0 07/01/2018   TRIG 165.0 (H) 10/24/2020   CHOLHDL 2 10/24/2020    Hepatic Function Latest Ref Rng & Units 03/28/2021 08/26/2020 08/22/2019  Total Protein 6.0 - 8.3 g/dL 7.5 7.1 7.2  Albumin 3.5 - 5.2 g/dL 4.4 4.3 4.5  AST 0 - 37 U/L _0 ALT 0 - 53 U/L 33 26 17  Alk Phosphatase 39 - 117 U/L 56 69 76  Total Bilirubin 0.2 - 1.2 mg/dL 1.2 1.1 0.8    Lab Results  Component Value Date/Time   TSH 2.82 08/26/2020 09:51 AM   TSH 0.80 03/24/2016 12:01 PM    CBC Latest Ref Rng & Units 03/28/2021 08/26/2020 08/22/2019  WBC 4.0 - 10.5 K/uL 6.4 6.0 5.6  Hemoglobin 13.0 - 17.0 g/dL 14.8 14.6 14.8  Hematocrit 39.0 - 52.0 % 43.0 42.6 42.8  Platelets 150.0 - 400.0 K/uL 143.0(L) 129.0(L) 145.0(L)    No results found for: VD25OH  Clinical ASCVD: No  The 10-year ASCVD risk score (Arnett DK, et  al., 2019) is: 26.3%   Values used to calculate the score:     Age: 65 years     Sex: Male     Is Non-Hispanic African American: No     Diabetic: No     Tobacco smoker: No     Systolic Blood Pressure: 195 mmHg     Is BP treated: Yes     HDL Cholesterol: 76.9 mg/dL     Total Cholesterol: 186 mg/dL    Depression screen [facility] 2/9 10/28/2020 10/01/2020 08/23/2019  Decreased Interest 0 0 0  Down, Depressed, Hopeless 0 0 0  PHQ - 2 Score 0 0 0  Altered sleeping 3 0 0  Tired, decreased energy 2 0 0  Change in appetite 0 0 0  Feeling bad or failure about yourself  0 0 0  Trouble  concentrating 0 0 0  Moving slowly or fidgety/restless 0 0 0  Suicidal thoughts 0 0 0  PHQ-9 Score 5 0 0  Difficult doing work/chores - Not difficult at all Not difficult at all  Some recent data might be hidden    Social History   Tobacco Use  Smoking Status Former   Packs/day: 1.00   Years: 4.00   Pack years: 4.00   Types: Pipe, Cigarettes   Quit date: 07/27/1978   Years since quitting: 42.9  Smokeless Tobacco Never   BP Readings from Last 3 Encounters:  03/28/21 (!) 170/74  12/17/20 (!) 141/76  12/10/20 138/65   Pulse Readings from Last 3 Encounters:  03/28/21 (!) 59  12/17/20 (!) 44  12/10/20 (!) 44   Wt Readings from Last 3 Encounters:  03/28/21 236 lb 12 oz (107.4 kg)  12/17/20 215 lb (97.5 kg)  12/10/20 225 lb (102.1 kg)   BMI Readings from Last 3 Encounters:  03/28/21 37.64 kg/m  12/17/20 33.67 kg/m  12/10/20 35.24 kg/m    Assessment/Interventions: Review of patient past medical history, allergies, medications, health status, including review of consultants reports, laboratory and other test data, was performed as part of comprehensive evaluation and provision of chronic care management services.   SDOH:  (Social Determinants of Health) assessments and interventions performed: Yes   SDOH Screenings   Alcohol Screen: Low Risk    Last Alcohol Screening Score (AUDIT): 0  Depression (PHQ2-9): Medium Risk   PHQ-2 Score: 5  Financial Resource Strain: Low Risk    Difficulty of Paying Living Expenses: Not very hard  Food Insecurity: No Food Insecurity   Worried About Charity fundraiser in the Last Year: Never true   Ran Out of Food in the Last Year: Never true  Housing: Low Risk    Last Housing Risk Score: 0  Physical Activity: Inactive   Days of Exercise per Week: 0 days   Minutes of Exercise per Session: 0 min  Social Connections: Not on file  Stress: No Stress Concern Present   Feeling of Stress : Not at all  Tobacco Use: Medium Risk   Smoking  Tobacco Use: Former   Smokeless Tobacco Use: Never   Passive Exposure: Not on file  Transportation Needs: No Transportation Needs   Lack of Transportation (Medical): No   Lack of Transportation (Non-Medical): No    CCM Care Plan  Allergies  Allergen Reactions   Allopurinol Other (See Comments)    GI upset   Lisinopril-Hydrochlorothiazide     Burning in feet.   Procardia [Nifedipine] Other (See Comments)    Bradycardia, leg swelling   Spironolactone Other (  See Comments)    malaise, dizziness   Amlodipine Rash    BLE   Doxazosin Mesylate Rash   Latex Rash   Nortriptyline Rash   Uloric [Febuxostat] Rash    Medications Reviewed Today     Reviewed by Randy Kennedy, [facility] (Pharmacist) on 04/29/21 at [place] List Status: <None>   Medication Order Taking? Sig Documenting Provider Last Dose Status Informant  aspirin EC 81 MG tablet [id number] Yes Take 81 mg by mouth daily. [provider] Taking Active   colchicine 0.6 MG tablet [id number] Yes TAKE ONE TABLET BY MOUTH DAILY Randy Bush, MD Taking Active   fexofenadine [facility]) 180 MG tablet [id number] Yes Take 180 mg by mouth daily. [provider] Taking Active   furosemide (LASIX) 20 MG tablet [id number] Yes Take 1 tablet (20 mg total) by mouth 3 (three) times a week.  Patient taking differently: Take 20 mg by mouth 3 (three) times a week. M,W,F   Minna Merritts, MD Taking Active Self  gabapentin (NEURONTIN) 300 MG capsule [id number] Yes Take 1 capsule (300 mg total) by mouth 2 (two) times daily AND 2 capsules (600 mg total) at bedtime. Randy Bush, MD Taking Active   hydrALAZINE (APRESOLINE) 100 MG tablet [id number] Yes TAKE 1 AND 1/2 TABLETS BY MOUTH THREE TIMES DAILY, MAY increase TO FOUR TIMES DAILY AS NEEDED FOR HIGH BLOOD PRESSURE Randy Bush, MD Taking Active   HYDROcodone-acetaminophen [facility]) 10-325 MG tablet [id number] Yes TAKE ONE TABLET BY MOUTH EVERY 6 HOURS AS NEEDED Randy Bush, MD Taking Active   meloxicam (MOBIC) 15 MG tablet [id number] Yes TAKE 1 TABLET BY MOUTH ONCE DAILY AS NEEDED FOR  GOUT  FLARE Randy Bush, MD Taking Active   nitroGLYCERIN (NITROSTAT) 0.4 MG SL tablet [id number] Yes Place 1 tablet (0.4 mg total) under the tongue every 5 (five) minutes as needed for chest pain. Minna Merritts, MD Taking Active   pravastatin (PRAVACHOL) 40 MG tablet [id number] Yes TAKE ONE TABLET BY MOUTH ONCE DAILY Randy Bush, MD Taking Active   predniSONE (DELTASONE) 20 MG tablet [id number] Yes Take 1 tablet (20 mg total) by mouth daily. For 1-2 days prn gout flare Randy Bush, MD Taking Active   probenecid (BENEMID) 500 MG tablet [id number] Yes TAKE 1/2 TABLET BY MOUTH TWICE DAILY Randy Bush, MD Taking Active   valsartan (DIOVAN) 320 MG tablet [id number] Yes Take 1 tablet (320 mg total) by mouth daily. Randy Bush, MD Taking Active   vitamin C (ASCORBIC ACID) 500 MG tablet [id number] Yes Take 500 mg by mouth daily. [provider] Taking Active             Patient Active Problem List   Diagnosis Date Noted   Pain and swelling of left ankle 03/28/2021   Family history of colon cancer in mother    Occult blood positive stool    Hearing loss 10/31/2020   Open wound of right external ear 10/31/2020   Left testicular pain 10/31/2020   Myalgia 08/26/2020   Burning sensation of skin 08/26/2020   Chronic pain disorder 08/26/2020   Pain and swelling of elbow, left 08/04/2018   Shortness of breath 07/05/2018   Allergic rhinitis 04/20/2018   Nasal congestion 05/19/2017   Severe obesity (BMI 35.0-39.9) with comorbidity ([place]) 01/07/2017   Encounter for chronic pain management 10/11/2016   Chronic fatigue 03/20/2016   Narcolepsy    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   Bunion 10/22/2015   Cough 10/22/2015   Medicare annual wellness visit,  subsequent 08/14/2015   Health maintenance examination 08/14/2015   Advanced care planning/counseling  discussion 08/14/2015   Thrombocytopenia ([place]) 08/05/2015   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   History of ankle fracture 07/19/2013   Drooping eyelid 03/09/2013   Leg edema 02/16/2013   Bradycardia 02/16/2013   Erectile dysfunction 09/14/2012   Gout 09/13/2012   GERD (gastroesophageal reflux disease)    HTN (hypertension)    HLD (hyperlipidemia)    OSA (obstructive sleep apnea)    RLS (restless legs syndrome)    [name] disease    Narcotic dependence ([place])    Systolic murmur     Immunization History  Administered Date(s) Administered   Fluad Quad(high Dose 65+) 04/06/2019   Influenza, Seasonal, Injecte, Preservative Fre 08/02/2012, 04/26/2013   Influenza,inj,Quad PF,6+ Mos 04/30/2014, 08/14/2015, 03/24/2016, 05/19/2017, 04/20/2018   Influenza-Unspecified 04/30/2014, 08/14/2015, 03/24/2016, 05/19/2017, 04/20/2018   Janssen (J&J) SARS-COV-2 Vaccination 11/01/2019   Pneumococcal Conjugate-13 01/07/2017   Pneumococcal Polysaccharide-23 07/01/2018   Td 07/27/2012   Zoster, Live 04/26/2013    Conditions to be addressed/monitored:  Hypertension and Hyperlipidemia  Patient Care Plan: CCM Pharmacy Care Plan     Problem Identified: CHL AMB "PATIENT-SPECIFIC PROBLEM"      Long-Range Goal: Disease Management   Start Date: 04/29/2021  This Visit's Progress: Not on track  Priority: High  Note:   Current Barriers:  Unable to achieve control of blood pressure  Untreated sleep apnea   Pharmacist Clinical Goal(s):  Patient will contact provider office for questions/concerns as evidenced notation of same in electronic health record through collaboration with PharmD and provider.   Interventions: 1:1 collaboration with Randy Bush, MD regarding development and update of comprehensive plan of care as evidenced by provider attestation and co-signature Inter-disciplinary care team collaboration (see longitudinal plan of care) Comprehensive medication review performed; medication list  updated in electronic medical record  Hypertension (BP goal <140/90) -Uncontrolled - per home and clinic readings  -See cardio about once a year, last visit with Dr. Rockey Situ (08/2020), increased Lasix to TID. PCP switched losartan to valsartan April 2022. Multiple med intolerances. Bradycardia limits options as well. -Current treatment: Hydralazine 100 mg - 1 and 1/2 tablet TID (up to QID PRN) Valsartan 320 mg - 1 tablet daily (morning) Lasix 20 mg - 1 tablet three days per week (M,W,F) -Medications previously tried: hctz, chlorthalidone (gout), amlodipine (swelling), spironolactone (dizziness), doxazosin (rash), metoprolol, clonidine (bradycardia) -Current home readings: Patients blood pressure remains elevated. He has checked at home for the past week. The lowest reading he recorded was 178/76. Highest 211/93. States its running 200/80s most evenings. 170s/80s during the day. He has a home BP monitor (arm cuff). Pt reports he has verified accuracy at office. -Adherence - He is taking all meds as prescribed. He is not using the PRN dose hydrazine - encouraged him to take this in evenings.  -Current dietary habits: he uses table salt, but has tried to decrease - discussed trying a salt substitute. Patient plans to work on weight loss after holidays. His goal weight is < 200 lbs. He will follow a caloric restrictive diet. -Denies hypotensive/hypertensive symptoms. No abnormal symptoms, chronic fatigue, headaches often. He has headache today which he attributes to sinus drainage.  From previous history 04/29/21: -Reports family history of HTN. He has sleep apnea - sleeps in recliner, unable to wear CPAP due to rash/allergic reaction. Reports failed multiple sleep studies due to no REM sleep. He has been diagnosed with narcolepsy. Reports he tried the CPAP off and on  for about a year and unable to tolerate due to allergic reaction. States he even tried maskless CPAP. Naps most afternoons. He takes  gabapentin at night and it makes him drowsy. No issue falling asleep. Sleeps from 2 AM - 10 AM. States there are a lot of nights he does not sleep at all due to thinking (not anxiety) just thinking. Previously tried Ambien and did not like it due to grogginess. He declined interested in referral to sleep specialist 04/29/21 -Current exercise habits: chases grandchildren around 3-4 days week -Denies alcohol or tobacco.  -Takes NSAID about once/week and PRN gout flares (last flare around 07/2020) -Educated on BP goals and benefits of medications for prevention of heart attack, stroke and kidney damage; -Counseled to monitor BP at home daily, document, and provide log at future appointments Recommend avoiding table salt. Encouraged and support weight loss goals. Collaborate with PCP for medication management - consider retrial amlodipine.  Hyperlipidemia: (LDL goal < 100) -Controlled  -Pt affirms adherence daily in evening -Current treatment: Pravastatin 40 mg - 1 tablet daily -Medications previously tried: none  -Educated on Cholesterol goals;  Exercise goal of 150 minutes per week; -Recommended to continue current medication  Patient Goals/Self-Care Activities Patient will:  - take medications as prescribed check blood pressure weekly, document, and provide at future appointments  Follow Up Plan: Telephone follow up appointment with care management team member scheduled for:  30 days  - lifestyle review for HTN control      Medication Assistance: None required.  Patient affirms current coverage meets needs.  Compliance/Adherence/Medication fill history: Care Gaps: Last BP > 140/90  Star-Rating Drugs: Medication:                Last Fill:         Day Supply Pravastatin 64m                    04/16/21            90 Valsartan 3247m                    04/16/21            90  Patient's preferred pharmacy is:  [facility] [place] [address] [place]: [phone]: [phone] [place] [address]: [phone]: [phone] pill box? No Pt endorses 100% compliance - reports he set an alarm on phone for hydralazine mid-day He reports he has 9 daily medications including aspirin. OTCs - vitamin C and MVI  Care Plan and Follow Up Patient Decision:  Patient agrees to Care Plan and Follow-up.  MiDebbora DusPharmD Clinical Pharmacist [phone]

## 2021-06-17 ENCOUNTER — Other Ambulatory Visit: Payer: Self-pay

## 2021-06-17 NOTE — Patient Instructions (Signed)
Dear Randy Kennedy,  Below is a summary of the goals we discussed during our follow up appointment on June 16, 2021. Please contact me anytime with questions or concerns.   Visit Information Patient Care Plan: CCM Pharmacy Care Plan     Problem Identified: CHL AMB "PATIENT-SPECIFIC PROBLEM"      Long-Range Goal: Disease Management   Start Date: 04/29/2021  This Visit's Progress: Not on track  Priority: High  Note:   Current Barriers:  Unable to achieve control of blood pressure  Untreated sleep apnea   Pharmacist Clinical Goal(s):  Patient will contact provider office for questions/concerns as evidenced notation of same in electronic health record through collaboration with PharmD and provider.   Interventions: 1:1 collaboration with Randy Bush, MD regarding development and update of comprehensive plan of care as evidenced by provider attestation and co-signature Inter-disciplinary care team collaboration (see longitudinal plan of care) Comprehensive medication review performed; medication list updated in electronic medical record  Hypertension (BP goal <140/90) -Uncontrolled - per home and clinic readings  -See cardio about once a year, last visit with Dr. Rockey Situ (08/2020), increased Lasix to TID. PCP switched losartan to valsartan April 2022. Multiple med intolerances. Bradycardia limits options as well. -Current treatment: Hydralazine 100 mg - 1 and 1/2 tablet TID (up to QID PRN) Valsartan 320 mg - 1 tablet daily (morning) Lasix 20 mg - 1 tablet three days per week (M,W,F) -Medications previously tried: hctz, chlorthalidone (gout), amlodipine (swelling), spironolactone (dizziness), doxazosin (rash), metoprolol, clonidine (bradycardia) -Current home readings: Patients blood pressure remains elevated. He has checked at home for the past week. The lowest reading he recorded was 178/76. Highest 211/93. States its running 200/80s most evenings. 170s/80s during the  day. He has a home BP monitor (arm cuff). Pt reports he has verified accuracy at office. -Adherence - He is taking all meds as prescribed. He is not using the PRN dose hydrazine - encouraged him to take this in evenings.  -Current dietary habits: he uses table salt, but has tried to decrease - discussed trying a salt substitute. Patient plans to work on weight loss after holidays. His goal weight is < 200 lbs. He will follow a caloric restrictive diet. -Denies hypotensive/hypertensive symptoms. No abnormal symptoms, chronic fatigue, headaches often. He has headache today which he attributes to sinus drainage.  From previous history 04/29/21: -Reports family history of HTN. He has sleep apnea - sleeps in recliner, unable to wear CPAP due to rash/allergic reaction. Reports failed multiple sleep studies due to no REM sleep. He has been diagnosed with narcolepsy. Reports he tried the CPAP off and on for about a year and unable to tolerate due to allergic reaction. States he even tried maskless CPAP. Naps most afternoons. He takes gabapentin at night and it makes him drowsy. No issue falling asleep. Sleeps from 2 AM - 10 AM. States there are a lot of nights he does not sleep at all due to thinking (not anxiety) just thinking. Previously tried Ambien and did not like it due to grogginess. He declined interested in referral to sleep specialist 04/29/21 -Current exercise habits: chases grandchildren around 3-4 days week -Denies alcohol or tobacco.  -Takes NSAID about once/week and PRN gout flares (last flare around 07/2020) -Educated on BP goals and benefits of medications for prevention of heart attack, stroke and kidney damage; -Counseled to monitor BP at home daily, document, and provide log at future appointments Recommend avoiding table salt. Encouraged and support weight loss goals. Collaborate  with PCP for medication management - consider retrial amlodipine.  Hyperlipidemia: (LDL goal < 100) -Controlled   -Pt affirms adherence daily in evening -Current treatment: Pravastatin 40 mg - 1 tablet daily -Medications previously tried: none  -Educated on Cholesterol goals;  Exercise goal of 150 minutes per week; -Recommended to continue current medication  Patient Goals/Self-Care Activities Patient will:  - take medications as prescribed check blood pressure weekly, document, and provide at future appointments  Follow Up Plan: Telephone follow up appointment with care management team member scheduled for:  30 days  - lifestyle review for HTN control      Patient verbalizes understanding of instructions provided today and agrees to view in [place].   [name], PharmD Clinical Pharmacist [facility] at [facility] [phone]

## 2021-06-18 MED ORDER — AMLODIPINE BESYLATE 5 MG PO TABS
5.0000 mg | ORAL_TABLET | Freq: Every day | ORAL | 3 refills | Status: DC
Start: 2021-06-18 — End: 2021-06-24

## 2021-06-18 NOTE — Telephone Encounter (Signed)
Reasonable to retrial amlodipine 5mg  daily if he's willing as he's on ARB and lasix.  [facility]  If not improved or intolerant, recommend f/u with cardiology for high blood pressures (last seen 08/2020, rec 22mo f/u at that time).

## 2021-06-24 ENCOUNTER — Telehealth: Payer: Self-pay | Admitting: Cardiovascular Disease

## 2021-06-24 ENCOUNTER — Ambulatory Visit: Payer: PPO | Admitting: Cardiovascular Disease

## 2021-06-24 ENCOUNTER — Other Ambulatory Visit: Payer: Self-pay

## 2021-06-24 ENCOUNTER — Encounter: Payer: Self-pay | Admitting: Cardiovascular Disease

## 2021-06-24 VITALS — BP 180/80 | HR 51 | Ht 67.0 in | Wt 234.2 lb

## 2021-06-24 DIAGNOSIS — I10 Essential (primary) hypertension: Secondary | ICD-10-CM | POA: Diagnosis not present

## 2021-06-24 MED ORDER — DOXAZOSIN MESYLATE 2 MG PO TABS: 2.0000 mg | ORAL_TABLET | Freq: Two times a day (BID) | ORAL | 3 refills | Status: DC

## 2021-06-24 NOTE — Telephone Encounter (Signed)
Spoke with patient today. He tried amlodipine 5 mg last week (started 11/23). He took for 3 days and a rash appeared in groin area. Resolved upon discontinuation. He also did not note any change in his BP while taking it. I have updated allergy list. He agrees to schedule follow up visit with cardiology for HTN.  [name], PharmD Clinical Pharmacist [facility] at [facility] [phone]

## 2021-06-24 NOTE — Addendum Note (Signed)
Addended by: [name] on: 06/24/2021 09:58 AM   Modules accepted: Orders

## 2021-06-24 NOTE — Patient Instructions (Addendum)
Medication Instructions:  Please START cardura/doxazosin 2 mg twice a day  If you need a refill on your cardiac medications before your next appointment, please call your pharmacy.   Lab work: No new labs needed  Testing/Procedures: No new testing needed  Follow-Up: At [facility], you and your health needs are our priority.  As part of our continuing mission to provide you with exceptional heart care, we have created designated Provider Care Teams.  These Care Teams include your primary Cardiologist (physician) and Advanced Practice Providers (APPs -  Physician Assistants and Nurse Practitioners) who all work together to provide you with the care you need, when you need it.  You will need a follow up appointment in 6 months  Providers on your designated Care Team:   Murray Hodgkins, NP Christell Faith, PA-C Cadence Kathlen Mody, [place]  COVID-19 Vaccine Information can be found at: [email or web address] For questions related to vaccine distribution or appointments, please email [email or web address] or call [phone].

## 2021-06-24 NOTE — Telephone Encounter (Signed)
Pt c/o BP issue: STAT if pt c/o blurred vision, one-sided weakness or slurred speech  1. What are your last 5 BP readings?  Trending 366-294 systolic   2. Are you having any other symptoms (ex. Dizziness, headache, blurred vision, passed out)?    3. What is your BP issue? Patient advised by pcp to schedule appt with gollan  Schedule today at 320 open slot patient advised msg sent as fyi for visit    CHART PREP - FYI same day add on

## 2021-06-24 NOTE — Progress Notes (Signed)
Date:  06/24/2021   ID:  Randy Kennedy, DOB [birth year], MRN [id number]  Patient Location:  [address] [place] [address]   Provider location:   [facility], [place] office  PCP:  [name], MD  Cardiologist:  Arvid Right [facility]  Chief Complaint  Patient presents with   Hypertension    Patient c/o blood pressure issues and needs to have some medication adjustments as well as shortness of breath with over exertion. Medications reviewed by the patient verbally.     History of Present Illness:    Randy Kennedy is a 70 y.o. male  past medical history of RLS,  chronic burning in his feet/neuropathy, Chronic legs and arm numbness and tingling.  hypertension, medication intolerances hyperlipidemia,  OSA  ,not been using his bipap/cpap,  Episodes of atypical chest tightness.  Chronic SOB  Who presents for follow-up of his bradycardia,  Hypertension  chest pain  LOV 08/2020 In follow-up today reports blood pressure has been running high Tolerating valsartan daily with hydralazine 150 mg 3 times daily Blood pressure today 150 systolic, reports blood pressure at home typically 160 sometimes higher  Numerous medication intolerances discussed with him  Asymptomatic from his bradycardia No exercise  covid 06/2020 in to Jan 2022  Long discussion concerning medication intolerances Reviewed again Headaches, imdur,  Stopped cardura, 4 mg twice daily may have caused a rash Chlorthalidone causes gout Ca channel blocker: Amlodipine (rash) and Procardia leg swelling Clonidine /b-blocker: bradycardia spironolactone, unclear intolerance   Echo 06/11/2017 Normal EF, elevated RVSP  EKG personally reviewed by myself on todays visit NSR rate 51 bpm,. RBBB    Prior CV studies:   The following studies were reviewed today:  Echocardiogram November 2018 Left ventricle: The cavity size was normal. There was moderate   concentric  hypertrophy. Systolic function was normal. The   estimated ejection fraction was in the range of 55% to 60%. Wall   motion was normal; there were no regional wall motion   abnormalities. Features are consistent with a pseudonormal left   ventricular filling pattern, with concomitant abnormal relaxation   and increased filling pressure (grade 2 diastolic dysfunction). - Aortic valve: There was trivial regurgitation. - Mitral valve: There was mild regurgitation. - Left atrium: The atrium was mildly dilated. - Right atrium: The atrium was moderately dilated. - Pulmonary arteries: Systolic pressure was moderately increased.   PA peak pressure: 48 mm Hg (S).   Past Medical History:  Diagnosis Date   Allergic rhinitis    Arthritis    Chest pain    a. 11/2011 reportedly normal stress test performed in [place].   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 moderate, AHI 37.4 increased to 80 in REM, on BiPAP 14/10, 97% compliance >4 hrs (06/2013); b. Does not tolerate CPAP.   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  Treated with vicodin, prior tried fentanyl and butrans.   Systolic murmur    Wears dentures    Partial lower   Wears hearing aid in both ears    Does not wear all the time   Past Surgical History:  Procedure Laterality Date   CATARACT EXTRACTION W/PHACO Right 11/26/2020   Procedure: CATARACT EXTRACTION PHACO AND INTRAOCULAR LENS PLACEMENT (I[facility]) RIGHT VIVITY TORIC LENS 7.17 00:49.6;  Surgeon: Birder Robson, MD;  Location: [facility];  Service: Ophthalmology;  Laterality: Right;  Latex Sleep Apnea   CATARACT EXTRACTION W/PHACO Left 12/10/2020   Procedure: CATARACT EXTRACTION PHACO AND INTRAOCULAR LENS PLACEMENT ([place]) LEFT VIVITY;  Surgeon: Birder Robson, MD;  Location: [facility];  Service: Ophthalmology;  Laterality: Left;  Latex Sleep apnea 5.37 00:32.7   COLONOSCOPY  02/2011   ext hem, benign polyp, rpt 5 yrs ([place])   COLONOSCOPY WITH PROPOFOL N/A 12/17/2020   multiple TA, ext hem, rpt 3 yrs ([place], Tally Due, MD)   LEG SURGERY  x5   left - after fall at work (3.5 stories)   [place]   jaw fracture - horse kick   TYMPANIC MEMBRANE REPAIR Left 1994   otosclerosis     Current Meds  Medication Sig   aspirin EC 81 MG tablet Take 81 mg by mouth daily.   colchicine 0.6 MG tablet TAKE ONE TABLET BY MOUTH DAILY   fexofenadine (ALLEGRA) 180 MG tablet Take 180 mg by mouth daily.   furosemide (LASIX) 20 MG tablet Take 1 tablet (20 mg total) by mouth 3 (three) times a week.   gabapentin (NEURONTIN) 300 MG capsule Take 1 capsule (300 mg total) by mouth 2 (two) times daily AND 2 capsules (600 mg total) at bedtime.   hydrALAZINE (APRESOLINE) 100 MG tablet TAKE 1 AND 1/2 TABLETS BY MOUTH THREE TIMES DAILY, MAY increase TO FOUR TIMES DAILY AS NEEDED FOR HIGH BLOOD PRESSURE   HYDROcodone-acetaminophen (NORCO) 10-325 MG tablet TAKE ONE TABLET BY MOUTH EVERY 6 HOURS AS NEEDED   ibuprofen (ADVIL) 600 MG tablet Take 600 mg by mouth every 6 (six) hours as needed.    meloxicam (MOBIC) 15 MG tablet TAKE 1 TABLET BY MOUTH ONCE DAILY AS NEEDED FOR  GOUT  FLARE   nitroGLYCERIN (NITROSTAT) 0.4 MG SL tablet Place 1 tablet (0.4 mg total) under the tongue every 5 (five) minutes as needed for chest pain.   pravastatin (PRAVACHOL) 40 MG tablet TAKE ONE TABLET BY MOUTH ONCE DAILY   predniSONE (DELTASONE) 20 MG tablet Take 1 tablet (20 mg total) by mouth daily. For 1-2 days prn gout flare   probenecid (BENEMID) 500 MG tablet TAKE 1/2 TABLET BY MOUTH TWICE DAILY   valsartan (DIOVAN) 320 MG tablet Take 1 tablet (320 mg total) by mouth daily.   vitamin C (ASCORBIC ACID) 500 MG tablet Take 500 mg by mouth daily.     Allergies:   Amlodipine, Allopurinol, Lisinopril-hydrochlorothiazide, Procardia [nifedipine], Spironolactone, Doxazosin mesylate, Latex, Nortriptyline, and Uloric [febuxostat]   Social History  Tobacco Use   Smoking status: Former    Packs/day: 1.00    Years: 4.00    Pack years: 4.00    Types: Pipe, Cigarettes    Quit date: 07/27/1978    Years since quitting: 42.9   Smokeless tobacco: Never  Vaping Use   Vaping Use: Never used  Substance Use Topics   Alcohol use: No    Alcohol/week: 0.0 standard drinks   Drug use: No     Family Hx: The patient's family history includes Cancer in his mother and paternal uncle; Cancer (age of onset: 44) in his father; Hypertension in his mother. There is no history of Diabetes, Stroke, or CAD.  ROS:   Please see the history of present illness.    Review of Systems  Constitutional: Negative.   Respiratory: Negative.    Cardiovascular: Negative.   Gastrointestinal: Negative.   Musculoskeletal: Negative.   Neurological: Negative.        Numb and tingling  Psychiatric/Behavioral: Negative.    All other systems reviewed and are negative.   Labs/Other Tests and Data Reviewed:    Recent Labs: 08/26/2020: TSH 2.82 03/28/2021: ALT 33; BUN 16; Creatinine, Ser 1.25; Hemoglobin 14.8; Platelets 143.0; Potassium 4.0;  Pro B Natriuretic peptide (BNP) 36.0; Sodium 142   Recent Lipid Panel Lab Results  Component Value Date/Time   CHOL 186 10/24/2020 08:54 AM   CHOL 130 02/09/2012 12:00 AM   TRIG 165.0 (H) 10/24/2020 08:54 AM   TRIG 142 02/09/2012 12:00 AM   HDL 76.90 10/24/2020 08:54 AM   CHOLHDL 2 10/24/2020 08:54 AM   LDLCALC 76 10/24/2020 08:54 AM   LDLCALC 47 02/09/2012 12:00 AM   LDLDIRECT 90.0 07/01/2018 09:35 AM    Wt Readings from Last 3 Encounters:  06/24/21 234 lb 4 oz (106.3 kg)  03/28/21 236 lb 12 oz (107.4 kg)  12/17/20 215 lb (97.5 kg)     Exam:    BP (!) 180/80 (BP Location: Left Arm, Patient Position: Sitting, Cuff Size: Normal)   Pulse (!) 51   Ht 5\' 7"  (1.702 m)   Wt 234 lb 4 oz (106.3 kg)   SpO2 98%   BMI 36.69 kg/m  Constitutional:  oriented to person, place, and time. No distress.  HENT:  Head: Grossly normal Eyes:  no discharge. No scleral icterus.  Neck: No JVD, no carotid bruits  Cardiovascular: Regular rate and rhythm, no murmurs appreciated Pulmonary/Chest: Clear to auscultation bilaterally, no wheezes or rails Abdominal: Soft.  no distension.  no tenderness.  Musculoskeletal: Normal range of motion Neurological:  normal muscle tone. Coordination normal. No atrophy Skin: Skin warm and dry Psychiatric: normal affect, pleasant   ASSESSMENT & PLAN:    Morbid obesity ([place]) We have encouraged continued exercise, careful diet management in an effort to lose weight.  Essential hypertension  Lasix 20 mg 3x a week, for pulm HTN Numerous med intolerances Difficult to treat With that said, we will revisit Cardura and start 2 mg twice a day, slow titration on with 1 mg twice daily up to 2 mg twice daily as tolerated Notes indicating possible  rash for twice daily  OSA (obstructive sleep apnea) Not on cpap Sleeps in recliner  Mixed hyperlipidemia Recent numbers above goal, medication compliance recommended, weight loss  Chronic fatigue Not wearing his CPAP  at night no regular exercise Recommended weight loss and walking program  RLS (restless legs syndrome) Chronic stable sx  SOB (shortness of breath) Likely from deconditioning, obesity,  Lasix sparingly  Total encounter time more than 25 minutes  Greater than 50% was spent in counseling and coordination of care with the patient    Signed, Ida Rogue, MD  06/24/2021 3:47 PM    [place] Office [address], [place], [place] [address]

## 2021-06-25 DIAGNOSIS — I10 Essential (primary) hypertension: Secondary | ICD-10-CM | POA: Diagnosis not present

## 2021-06-25 DIAGNOSIS — E782 Mixed hyperlipidemia: Secondary | ICD-10-CM

## 2021-06-25 NOTE — Telephone Encounter (Signed)
Seen by Dr. Rockey Situ as DOD 11/29 Started on cardura/doxazosin 2 mg twice a day for HTN Will monitor BP F/U in 6 months

## 2021-07-02 ENCOUNTER — Other Ambulatory Visit: Payer: Self-pay | Admitting: Family Medicine

## 2021-07-02 NOTE — Telephone Encounter (Signed)
Name of Medication: Hydrocodone-APAP Name of Pharmacy: [facility]  Last Fill or Written Date and Quantity: 06/09/21, #90 Last Office Visit and Type: 10/28/20, AWV prt 2 Next Office Visit and Type: none Last Controlled Substance Agreement Date: 09/29/16 Last UDS: 03/10/19

## 2021-07-03 HISTORY — PX: MOHS SURGERY: SHX181

## 2021-07-03 NOTE — Telephone Encounter (Signed)
ERx plz schedule chronic pain flu visit

## 2021-07-03 NOTE — Telephone Encounter (Signed)
Vm full but I sent a Press photographer

## 2021-07-04 ENCOUNTER — Telehealth: Payer: Self-pay

## 2021-07-04 NOTE — Progress Notes (Signed)
Chronic Care Management Pharmacy Assistant   Name: Randy Kennedy  MRN: [id number] DOB: 12-11-1950  Reason for Encounter: CCM (Medication Adherence and Delivery Coordination)   Recent office visits:  None since last CCM contact  Recent consult visits:  06/24/2021 - Cardiology - Patient presented for hypertension. Labs: EKG Start:  doxazosin (CARDURA) 2 MG tablet.  Hospital visits:  None in previous 6 months  Medications: Outpatient Encounter Medications as of 07/04/2021  Medication Sig   aspirin EC 81 MG tablet Take 81 mg by mouth daily.   colchicine 0.6 MG tablet TAKE ONE TABLET BY MOUTH DAILY   doxazosin (CARDURA) 2 MG tablet Take 1 tablet (2 mg total) by mouth 2 (two) times daily.   fexofenadine (ALLEGRA) 180 MG tablet Take 180 mg by mouth daily.   furosemide (LASIX) 20 MG tablet Take 1 tablet (20 mg total) by mouth 3 (three) times a week.   gabapentin (NEURONTIN) 300 MG capsule Take 1 capsule (300 mg total) by mouth 2 (two) times daily AND 2 capsules (600 mg total) at bedtime.   hydrALAZINE (APRESOLINE) 100 MG tablet TAKE 1 AND 1/2 TABLETS BY MOUTH THREE TIMES DAILY, MAY increase TO FOUR TIMES DAILY AS NEEDED FOR HIGH BLOOD PRESSURE   HYDROcodone-acetaminophen (NORCO) 10-325 MG tablet TAKE ONE TABLET BY MOUTH EVERY 6 HOURS AS NEEDED   ibuprofen (ADVIL) 600 MG tablet Take 600 mg by mouth every 6 (six) hours as needed.   meloxicam (MOBIC) 15 MG tablet TAKE 1 TABLET BY MOUTH ONCE DAILY AS NEEDED FOR  GOUT  FLARE   nitroGLYCERIN (NITROSTAT) 0.4 MG SL tablet Place 1 tablet (0.4 mg total) under the tongue every 5 (five) minutes as needed for chest pain.   pravastatin (PRAVACHOL) 40 MG tablet TAKE ONE TABLET BY MOUTH ONCE DAILY   predniSONE (DELTASONE) 20 MG tablet Take 1 tablet (20 mg total) by mouth daily. For 1-2 days prn gout flare   probenecid (BENEMID) 500 MG tablet TAKE 1/2 TABLET BY MOUTH TWICE DAILY   valsartan (DIOVAN) 320 MG tablet Take 1 tablet (320 mg total) by mouth  daily.   vitamin C (ASCORBIC ACID) 500 MG tablet Take 500 mg by mouth daily.   No facility-administered encounter medications on file as of 07/04/2021.   BP Readings from Last 3 Encounters:  06/24/21 (!) 180/80  03/28/21 (!) 170/74  12/17/20 (!) 141/76    Lab Results  Component Value Date   HGBA1C 5.6 10/24/2020    Recent OV, Consult or Hospital visit:   Recent medication changes indicated:  Start:  doxazosin (CARDURA) 2 MG tablet.  Last adherence delivery date: 04/18/2021   Patient is due for next adherence delivery on: 07/17/2021  Multiple attempts made to reach patient. Unsuccessful outreach. Will refill based off of last adherence fill.    This delivery to include: Vials  90 Days  Gabapentin 300mg  take 1 tablet at breakfast 1 tablet evening meal, 2 tablets at bedtime Probenecid 500 mg  -  tablet BID Pravastatin 40 mg  - 1 tablet qpm Valsartan 320 mg take  1 tablet at breakfast Hydralazine 100 mg- Take 1.5 tablets TID may increase to QID if needed for blood pressure Colchicine 0.6mg  take 1 tablet at breakfast  Hydrocodone 10/325mg  take 1 tablet every 6 hours prn pain   Refills requested from providers include: Colchicine 0.6mg  take 1 tablet at breakfast  Hydrocodone 10/325mg  take 1 tablet every 6 hours prn pain   Delivery scheduled for 07/17/2021. Unable to speak  with patient to confirm date.   Annual wellness visit in last year? Yes 10/28/2020 Most Recent BP reading: 180/80 on 06/24/2021  [name], CPP notified  Marijean Niemann, [place] Assistant [phone]

## 2021-07-05 ENCOUNTER — Other Ambulatory Visit: Payer: Self-pay | Admitting: Family Medicine

## 2021-07-08 NOTE — Telephone Encounter (Addendum)
Patient returned call. Confirmed medications for delivery. He has 3 weeks supply on hand so we rescheduled deliver for 07/29/20.

## 2021-07-15 ENCOUNTER — Telehealth: Payer: Self-pay

## 2021-07-15 NOTE — Progress Notes (Addendum)
Chronic Care Management Pharmacy Assistant   Name: Randy Kennedy  MRN: [id number] DOB: 07/05/1951  Reason for Encounter: CCM (Hypertension Disease State)   Recent office visits:  None since last CCM contact  Recent consult visits:  06/24/2021 - Cardiology - Patient presented for hypertension. Labs. EKG. Start: doxazosin (CARDURA) 2 MG tablet - Take 1 tablet (2 mg total) by mouth 2 (two) times daily slow titration on with 1 mg twice daily up to 2 mg twice daily as tolerated.  Hospital visits:  None in previous 6 months  Medications: Outpatient Encounter Medications as of 07/15/2021  Medication Sig   aspirin EC 81 MG tablet Take 81 mg by mouth daily.   colchicine 0.6 MG tablet TAKE ONE TABLET BY MOUTH ONCE DAILY   doxazosin (CARDURA) 2 MG tablet Take 1 tablet (2 mg total) by mouth 2 (two) times daily.   fexofenadine (ALLEGRA) 180 MG tablet Take 180 mg by mouth daily.   furosemide (LASIX) 20 MG tablet Take 1 tablet (20 mg total) by mouth 3 (three) times a week.   gabapentin (NEURONTIN) 300 MG capsule Take 1 capsule (300 mg total) by mouth 2 (two) times daily AND 2 capsules (600 mg total) at bedtime.   hydrALAZINE (APRESOLINE) 100 MG tablet TAKE 1 AND 1/2 TABLETS BY MOUTH THREE TIMES DAILY, MAY increase TO FOUR TIMES DAILY AS NEEDED FOR HIGH BLOOD PRESSURE   HYDROcodone-acetaminophen (NORCO) 10-325 MG tablet TAKE ONE TABLET BY MOUTH EVERY 6 HOURS AS NEEDED   ibuprofen (ADVIL) 600 MG tablet Take 600 mg by mouth every 6 (six) hours as needed.   meloxicam (MOBIC) 15 MG tablet TAKE 1 TABLET BY MOUTH ONCE DAILY AS NEEDED FOR  GOUT  FLARE   nitroGLYCERIN (NITROSTAT) 0.4 MG SL tablet Place 1 tablet (0.4 mg total) under the tongue every 5 (five) minutes as needed for chest pain.   pravastatin (PRAVACHOL) 40 MG tablet TAKE ONE TABLET BY MOUTH ONCE DAILY   predniSONE (DELTASONE) 20 MG tablet Take 1 tablet (20 mg total) by mouth daily. For 1-2 days prn gout flare   probenecid (BENEMID) 500  MG tablet TAKE 1/2 TABLET BY MOUTH TWICE DAILY   valsartan (DIOVAN) 320 MG tablet Take 1 tablet (320 mg total) by mouth daily.   vitamin C (ASCORBIC ACID) 500 MG tablet Take 500 mg by mouth daily.   No facility-administered encounter medications on file as of 07/15/2021.   Recent Office Vitals: BP Readings from Last 3 Encounters:  06/24/21 (!) 180/80  03/28/21 (!) 170/74  12/17/20 (!) 141/76   Pulse Readings from Last 3 Encounters:  06/24/21 (!) 51  03/28/21 (!) 59  12/17/20 (!) 44    Wt Readings from Last 3 Encounters:  06/24/21 234 lb 4 oz (106.3 kg)  03/28/21 236 lb 12 oz (107.4 kg)  12/17/20 215 lb (97.5 kg)    Kidney Function Lab Results  Component Value Date/Time   CREATININE 1.25 03/28/2021 09:33 AM   CREATININE 1.08 10/24/2020 08:54 AM   CREATININE 1.22 03/25/2014 09:27 AM   CREATININE 1.06 02/09/2012 12:00 AM   GFR 58.42 (L) 03/28/2021 09:33 AM   GFRNONAA >60 10/04/2017 08:05 AM   GFRNONAA >60 03/25/2014 09:27 AM   GFRAA >60 10/04/2017 08:05 AM   GFRAA >60 03/25/2014 09:27 AM   BMP Latest Ref Rng & Units 03/28/2021 10/24/2020 08/26/2020  Glucose 70 - 99 mg/dL 111(H) 102(H) 113(H)  BUN 6 - 23 mg/dL 16 23 20   Creatinine 0.40 - 1.50 mg/dL  1.25 1.08 0.98  Sodium 135 - 145 mEq/L 142 140 142  Potassium 3.5 - 5.1 mEq/L 4.0 3.8 4.0  Chloride 96 - 112 mEq/L 103 104 104  CO2 19 - 32 mEq/L 30 27 30   Calcium 8.4 - 10.5 mg/dL 9.5 9.6 9.7    Tried to contact patient 3 times on 12/21, 12/22 and 12/27 to discuss HTN. Unsuccessful outreach. Will attempt to contact patient next month.   Current antihypertensive regimen:  Hydralazine 100 mg - 1 and 1/2 tablet TID (up to QID PRN) Valsartan 320 mg - 1 tablet daily (morning) Lasix 20 mg - 1 tablet three days per week (M,W,F) Doxazosin (CARDURA) 2 MG tablet - Take 1 tablet (2 mg total) by mouth 2 (two) times daily.   Adherence Review: Is the patient currently on ACE/ARB medication? Yes Does the patient have >5 day gap between  last estimated fill dates? No  Star Rating Drugs:  Medication:  Last Fill: Day Supply Pravastatin 40 mg 04/16/2021 90 Valsartan 320 mg 04/16/2021 90  Care Gaps: Annual wellness visit in last year? Yes 10/28/2020  Most Recent BP reading: 180/80 on 06/24/2021   PCP appointment on 10/02/2021 for AWV  [name], CPP notified  Marijean Niemann, [place] Assistant [phone]  I have reviewed the care management and care coordination activities outlined in this encounter and I am certifying that I agree with the content of this note. No further action required.  [name], PharmD Clinical Pharmacist [facility] at [facility] [phone]

## 2021-07-20 ENCOUNTER — Other Ambulatory Visit: Payer: Self-pay | Admitting: Family Medicine

## 2021-07-20 DIAGNOSIS — G8929 Other chronic pain: Secondary | ICD-10-CM

## 2021-07-21 NOTE — Telephone Encounter (Signed)
Refill request for HYDROcodone-acetaminophen (NORCO) 10-325 MG tablet  LOV - 03/28/21 Next OV - not scheduled Last refill - 07/03/21 #90/0

## 2021-07-23 MED ORDER — HYDROCODONE-ACETAMINOPHEN 10-325 MG PO TABS: 1.0000 | ORAL_TABLET | Freq: Four times a day (QID) | ORAL | 0 refills | Status: DC | PRN

## 2021-07-23 NOTE — Telephone Encounter (Signed)
ERx [place] CSRS reviewed

## 2021-07-25 NOTE — Telephone Encounter (Signed)
Verified refill history to make sure pt filled doxazosin. It was sent to [facility] instead of [facility].          Refill date DS Quantity  DOXAZOSIN MESYLATE 2MG  TABLET 07/19/2021 30 60 tablet   [name], PharmD Clinical Pharmacist Practitioner [facility] at [facility] [phone]

## 2021-07-31 ENCOUNTER — Encounter: Payer: Self-pay | Admitting: Cardiovascular Disease

## 2021-08-20 DIAGNOSIS — C44212 Basal cell carcinoma of skin of right ear and external auricular canal: Secondary | ICD-10-CM | POA: Diagnosis not present

## 2021-08-20 DIAGNOSIS — Z481 Encounter for planned postprocedural wound closure: Secondary | ICD-10-CM | POA: Diagnosis not present

## 2021-08-20 DIAGNOSIS — C4491 Basal cell carcinoma of skin, unspecified: Secondary | ICD-10-CM | POA: Diagnosis not present

## 2021-09-12 ENCOUNTER — Other Ambulatory Visit: Payer: Self-pay | Admitting: Family Medicine

## 2021-09-12 NOTE — Telephone Encounter (Signed)
Name of Medication: Hydrocodone-APAP Name of Pharmacy: [facility] Last Fill or Written Date and Quantity: 08/15/21, #90 Last Office Visit and Type: 03/28/21, L ankle pain/swelling Next Office Visit and Type: none Last Controlled Substance Agreement Date: 09/29/16 Last UDS: 03/10/19

## 2021-09-17 NOTE — Telephone Encounter (Signed)
ERx 

## 2021-09-29 ENCOUNTER — Telehealth: Payer: Self-pay | Admitting: Cardiovascular Disease

## 2021-09-29 NOTE — Telephone Encounter (Signed)
Patient calling to report bp s/p morning meds as 151/72 ?

## 2021-09-29 NOTE — Telephone Encounter (Signed)
Incoming triage call. ? ?Patient reports elevated BP in the 200's/100's over the weekend. ? ?Patient stopped taking his Doxazosin due to rash. He did however restart it yesterday morning due to consistently elevated readings. ? ?Patient sts that he took his BP medications shortly before calling our office. ?Hydralazine 150 mg ?Valsartan 320 mg ?Doxazosin 2 mg ?He did not check his BP this morning. I asked him to check it while I held the line. Pt reports 202/91. ?  ?Patient is asymptomatic. I asked the patient to recheck his BP in 2-3 hours. Instructed him to sit and relax 10-15 min prior to checking. Adv him to call the office for further instruction if his reading is still elevated. ? ?Patient has been scheduled to see Dr. Rockey Situ tomorrow 09/30/21 @ 3:20pm. Adv the patient to bring his BP machine with him to that appt so that it can be checked for accuracy. ?

## 2021-09-29 NOTE — Telephone Encounter (Signed)
Pt c/o BP issue: STAT if pt c/o blurred vision, one-sided weakness or slurred speech ? ?1. What are your last 5 BP readings? 200/90 as high as 235/96 previously normal would be around 170/80's  ? ?2. Are you having any other symptoms (ex. Dizziness, headache, blurred vision, passed out)? Headache dizziness  ? ?3. What is your BP issue? BP trending up last 2 weeks pcp has initiated beta blockers but patient doesn't tolrerated well due to itching from allergy  ? ? ? ?

## 2021-09-29 NOTE — Telephone Encounter (Signed)
Patient calling back states taking wrong Amoldipine medication cause high BP. Took Doxazosion this morning new reading 145/73. Wants to cancel appt if not needed, please assist ?Please call [phone] ?

## 2021-09-30 ENCOUNTER — Ambulatory Visit: Payer: PPO | Admitting: Cardiovascular Disease

## 2021-10-01 NOTE — Progress Notes (Signed)
Subjective:   Randy Kennedy is a 71 y.o. male who presents for Medicare Annual/Subsequent preventive examination.  I connected with Randy Kennedy today by telephone and verified that I am speaking with the correct person using two identifiers. Location patient: home Location provider: work Persons participating in the virtual visit: patient, Marine scientist.    I discussed the limitations, risks, security and privacy concerns of performing an evaluation and management service by telephone and the availability of in person appointments. I also discussed with the patient that there may be a patient responsible charge related to this service. The patient expressed understanding and verbally consented to this telephonic visit.    Interactive audio and video telecommunications were attempted between this provider and patient, however failed, due to patient having technical difficulties OR patient did not have access to video capability.  We continued and completed visit with audio only.  Some vital signs may be absent or patient reported.   Time Spent with patient on telephone encounter: 20 minutes  Review of Systems     Cardiac Risk Factors include: advanced age (>68mn, >>47women);hypertension     Objective:    Today's Vitals   10/02/21 1316  Weight: 225 lb (102.1 kg)  Height: '5\' 7"'$  (1.702 m)   Body mass index is 35.24 kg/m.  Advanced Directives 10/02/2021 12/17/2020 12/10/2020 11/26/2020 10/01/2020 08/23/2019 07/01/2018  Does Patient Have a Medical Advance Directive? Yes Yes Yes Yes No No Yes  Type of AParamedicof AMagnaLiving will - HRam[place]Living will [place]us[place]Living will - - HB[place]Living will  Does patient want to make changes to medical advance directive? Yes (MAU/Ambulatory/Procedural Areas - Information given) - No - Patient declined No - Patient declined - - -  Copy of Healthcare Power of  Attorney in Chart? - - No - copy requested No - copy requested - - No - copy requested  Would patient like information on creating a medical advance directive? - - - - Yes (MAU/Ambulatory/Procedural Areas - Information given) Yes (MAU/Ambulatory/Procedural Areas - Information given) -    Current Medications (verified) Outpatient Encounter Medications as of 10/02/2021  Medication Sig   aspirin EC 81 MG tablet Take 81 mg by mouth daily.   colchicine 0.6 MG tablet TAKE ONE TABLET BY MOUTH ONCE DAILY   doxazosin (CARDURA) 2 MG tablet Take 1 tablet (2 mg total) by mouth 2 (two) times daily.   fexofenadine (ALLEGRA) 180 MG tablet Take 180 mg by mouth daily.   gabapentin (NEURONTIN) 300 MG capsule Take 1 capsule (300 mg total) by mouth 2 (two) times daily AND 2 capsules (600 mg total) at bedtime.   hydrALAZINE (APRESOLINE) 100 MG tablet TAKE 1 AND 1/2 TABLETS BY MOUTH THREE TIMES DAILY, MAY increase TO FOUR TIMES DAILY AS NEEDED FOR HIGH BLOOD PRESSURE   HYDROcodone-acetaminophen (NORCO) 10-325 MG tablet TAKE ONE TABLET BY MOUTH EVERY 6 HOURS AS NEEDED   ibuprofen (ADVIL) 600 MG tablet Take 600 mg by mouth every 6 (six) hours as needed.   meloxicam (MOBIC) 15 MG tablet TAKE 1 TABLET BY MOUTH ONCE DAILY AS NEEDED FOR  GOUT  FLARE   nitroGLYCERIN (NITROSTAT) 0.4 MG SL tablet Place 1 tablet (0.4 mg total) under the tongue every 5 (five) minutes as needed for chest pain.   pravastatin (PRAVACHOL) 40 MG tablet TAKE ONE TABLET BY MOUTH ONCE DAILY   predniSONE (DELTASONE) 20 MG tablet Take 1 tablet (20 mg total) by  mouth daily. For 1-2 days prn gout flare   probenecid (BENEMID) 500 MG tablet TAKE 1/2 TABLET BY MOUTH TWICE DAILY   valsartan (DIOVAN) 320 MG tablet Take 1 tablet (320 mg total) by mouth daily.   vitamin C (ASCORBIC ACID) 500 MG tablet Take 500 mg by mouth daily.   furosemide (LASIX) 20 MG tablet Take 1 tablet (20 mg total) by mouth 3 (three) times a week.   No facility-administered encounter  medications on file as of 10/02/2021.    Allergies (verified) Amlodipine, Allopurinol, Lisinopril-hydrochlorothiazide, Procardia [nifedipine], Spironolactone, Doxazosin mesylate, Latex, Nortriptyline, and Uloric [febuxostat]   History: Past Medical History:  Diagnosis Date   Allergic rhinitis    Arthritis    Chest pain    a. 11/2011 reportedly normal stress test performed in [place].   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 moderate, AHI 37.4 increased to 80 in REM, on BiPAP 14/10, 97% compliance >4 hrs (06/2013); b. Does not tolerate CPAP.   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 Treated with vicodin, prior tried fentanyl and butrans.   Systolic murmur    Wears dentures    Partial lower   Wears hearing aid in both ears    Does not wear all the time   Past Surgical History:  Procedure Laterality Date   CATARACT EXTRACTION W/PHACO Right 11/26/2020   Procedure: CATARACT EXTRACTION PHACO AND INTRAOCULAR LENS PLACEMENT ([facility]) RIGHT VIVITY TORIC LENS 7.17 00:49.6;  Surgeon: Birder Robson, MD;  Location:  [facility];  Service: Ophthalmology;  Laterality: Right;  Latex Sleep Apnea   CATARACT EXTRACTION W/PHACO Left 12/10/2020   Procedure: CATARACT EXTRACTION PHACO AND INTRAOCULAR LENS PLACEMENT ([facility]) LEFT VIVITY;  Surgeon: Birder Robson, MD;  Location: [facility];  Service: Ophthalmology;  Laterality: Left;  Latex Sleep apnea 5.37 00:32.7   COLONOSCOPY  02/2011   ext hem, benign polyp, rpt 5 yrs ([place])   COLONOSCOPY WITH PROPOFOL N/A 12/17/2020   multiple TA, ext hem, rpt 3 yrs ([place], Tally Due, MD)   LEG SURGERY  x5   left - after fall at work (3.5 stories)   [place]   jaw fracture - horse kick   MOHS SURGERY Right 07/03/2021   Ear   TYMPANIC MEMBRANE REPAIR Left 1994   otosclerosis   Family History  Problem Relation Age of Onset   Cancer Father 33       prostate  Cancer Mother        rectal   Hypertension Mother    Cancer Paternal Uncle        prostate   Diabetes Neg Hx    Stroke Neg Hx    CAD Neg Hx    Social History   Socioeconomic History   Marital status: Married    Spouse name: Not on file   Number of children: Not on file   Years of education: Not on file   Highest education level: Not on file  Occupational History   Not on file  Tobacco Use   Smoking status: Former    Packs/day: 1.00    Years: 4.00    Pack years: 4.00    Types: Pipe, Cigarettes    Quit date: 07/27/1978    Years since quitting: 43.2   Smokeless tobacco: Never  Vaping Use   Vaping Use: Never used  Substance and Sexual Activity   Alcohol use: No    Alcohol/week: 0.0 standard drinks   Drug use: No   Sexual activity: Not on file  Other Topics Concern   Not on file  Social History Narrative   Caffeine: 2 cans coke/day   Lives with wife and grown son    Occupation: retired, was Agricultural engineer.   On disability for chronic pain   Edu: MBA   Activity: no regular exercise   Deit: good water, fruits/vegetables daily   Social Determinants  of Health   Financial Resource Strain: Low Risk    Difficulty of Paying Living Expenses: Not hard at all  Food Insecurity: No Food Insecurity   Worried About Charity fundraiser in the Last Year: Never true   Ran Out of Food in the Last Year: Never true  Transportation Needs: No Transportation Needs   Lack of Transportation (Medical): No   Lack of Transportation (Non-Medical): No  Physical Activity: Unknown   Days of Exercise per Week: 7 days   Minutes of Exercise per Session: Not on file  Stress: No Stress Concern Present   Feeling of Stress : Not at all  Social Connections: Moderately Isolated   Frequency of Communication with Friends and Family: More than three times a week   Frequency of Social Gatherings with Friends and Family: More than three times a week   Attends Religious Services: Never   Marine scientist or Organizations: No   Attends Music therapist: Never   Marital Status: Married    Tobacco Counseling Counseling given: Not Answered   Clinical Intake:  Pre-visit preparation completed: Yes  Pain : No/denies pain     BMI - recorded: 35.24 Nutritional Status: BMI > 30  Obese Nutritional Risks: None Diabetes: No  How often do you need to have someone help you when you read instructions, pamphlets, or other written materials from your doctor or pharmacy?: 1 - Never  Diabetic? No  Interpreter Needed?: No  Information entered by :: Orrin Brigham LPN   Activities of Daily Living In your present state of health, do you have any difficulty performing the following activities: 10/02/2021 12/10/2020  Hearing? Y N  Comment wears hearing aids -  Vision? N N  Difficulty concentrating or making decisions? N N  Walking or climbing stairs? N N  Dressing or bathing? N N  Doing errands, shopping? N -  Preparing Food and eating ? N -  Using the Toilet? N -  In the past six months, have you accidently leaked urine?  N -  Do you have problems with  loss of bowel control? N -  Managing your Medications? N -  Managing your Finances? N -  Housekeeping or managing your Housekeeping? N -  Some recent data might be hidden    Patient Care Team: [name], MD as PCP - General (Family Medicine) [name], [facility] as Pharmacist (Pharmacist) Minna Merritts, MD as Consulting Physician (Cardiology)  Indicate any recent Medical Services you may have received from other than [facility] providers in the past year (date may be approximate).     Assessment:   This is a routine wellness examination for [place].  Hearing/Vision screen Hearing Screening - Comments:: Wears hearing aids Vision Screening - Comments:: Last exam 05/2021, [facility]  Dietary issues and exercise activities discussed: Current Exercise Habits: Home exercise routine, Type of exercise: Other - see comments (does house work daily), Frequency (Times/Week): 7, Intensity: Mild   Goals Addressed             This Visit's Progress    Patient Stated       Would like to lose weight        Depression Screen PHQ 2/9 Scores 10/02/2021 10/28/2020 10/01/2020 08/23/2019 07/01/2018 01/07/2017 08/14/2015  PHQ - 2 Score 0 0 0 0 0 0 0  PHQ- 9 Score - 5 0 0 0 - -    Fall Risk Fall Risk  10/02/2021 10/28/2020 10/01/2020 08/23/2019 07/01/2018  Falls in the past year? 0 0 0 0 0  Number falls in past yr: 0 - 0 0 -  Injury with Fall? 0 - 0 0 -  Risk for fall due to : No Fall Risks - Medication side effect Medication side effect;Impaired balance/gait;Impaired mobility -  Follow up Falls prevention discussed - Falls evaluation completed;Falls prevention discussed Falls evaluation completed;Falls prevention discussed -    FALL RISK PREVENTION PERTAINING TO THE HOME:  Any stairs in or around the home? Yes  If so, are there any without handrails? No  Home free of loose throw rugs in walkways, pet beds, electrical cords, etc? Yes  Adequate lighting in your home to reduce risk of  falls? Yes   ASSISTIVE DEVICES UTILIZED TO PREVENT FALLS:  Life alert? No  Use of a cane, walker or w/c? No  Grab bars in the bathroom? Yes  Shower chair or bench in shower? Yes  Elevated toilet seat or a handicapped toilet? No   TIMED UP AND GO:  Was the test performed? No .    Cognitive Function: Normal cognitive status assessed by  this Nurse Health Advisor. No abnormalities found.   MMSE - Mini Mental State Exam 10/01/2020 08/23/2019 07/01/2018  Not completed: Refused - -  Orientation to time - 5 5  Orientation to Place - 5 5  Registration - 3 3  Attention/ Calculation - 5 0  Recall - 3 1  Recall-comments - - unable to recall 2 of 3 words  Language- name 2 objects - - 0  Language- repeat - 1 1  Language- follow 3 step command - - 3  Language- read & follow direction - - 0  Write a sentence - - 0  Copy design - - 0  Total score - - 18        Immunizations Immunization History  Administered Date(s) Administered   Fluad Quad(high Dose 65+) 04/06/2019   Influenza, Seasonal, Injecte, Preservative Fre 08/02/2012, 04/26/2013   Influenza,inj,Quad PF,6+ Mos 04/30/2014, 08/14/2015, 03/24/2016, 05/19/2017, 04/20/2018  Influenza-Unspecified 04/30/2014, 08/14/2015, 03/24/2016, 05/19/2017, 04/20/2018   Janssen (J&J) SARS-COV-2 Vaccination 11/01/2019   Pneumococcal Conjugate-13 01/07/2017   Pneumococcal Polysaccharide-23 07/01/2018   Td 07/27/2012   Zoster, Live 04/26/2013    TDAP status: Up to date  Flu Vaccine status: Due, Education has been provided regarding the importance of this vaccine. Advised may receive this vaccine at local pharmacy or Health Dept. Aware to provide a copy of the vaccination record if obtained from local pharmacy or Health Dept. Verbalized acceptance and understanding.  Pneumococcal vaccine status: Up to date  Covid-19 vaccine status: Information provided on how to obtain vaccines.   Qualifies for Shingles Vaccine? Yes   Zostavax completed Yes    Shingrix Completed?: No.    Education has been provided regarding the importance of this vaccine. Patient has been advised to call insurance company to determine out of pocket expense if they have not yet received this vaccine. Advised may also receive vaccine at local pharmacy or Health Dept. Verbalized acceptance and understanding.  Screening Tests Health Maintenance  Topic Date Due   Zoster Vaccines- Shingrix (1 of 2) Never done   COVID-19 Vaccine (2 - Booster for Janssen series) 12/27/2019   INFLUENZA VACCINE  02/24/2021   TETANUS/TDAP  07/27/2022   COLONOSCOPY (Pts 45-25yr Insurance coverage will need to be confirmed)  12/18/2023   Pneumonia Vaccine 71 Years old  Completed   Hepatitis C Screening  Completed   HPV VACCINES  Aged Out    Health Maintenance  Health Maintenance Due  Topic Date Due   Zoster Vaccines- Shingrix (1 of 2) Never done   COVID-19 Vaccine (2 - Booster for Janssen series) 12/27/2019   INFLUENZA VACCINE  02/24/2021    Colorectal cancer screening: Type of screening: Colonoscopy. Completed 12/17/20. Repeat every 3 years  Lung Cancer Screening: (Low Dose CT Chest recommended if Age 71-80years, 30 pack-year currently smoking OR have quit w/in 15years.) does not qualify.     Additional Screening:  Hepatitis C Screening: does qualify; Completed 01/04/17  Vision Screening: Recommended annual ophthalmology exams for early detection of glaucoma and other disorders of the eye. Is the patient up to date with their annual eye exam?  Yes  Who is the provider or what is the name of the office in which the patient attends annual eye exams[place] [facility] [place] Recommended annual dental exams for proper oral hygiene  Community Resource Referral / Chronic Care Management: CRR required this visit?  No   CCM required this visit?  No      Plan:     I have personally reviewed and noted the following in the patients chart:   Medical and  social history Use of alcohol, tobacco or illicit drugs  Current medications and supplements including opioid prescriptions. Patient is currently taking opioid prescriptions. Information provided to patient regarding non-opioid alternatives. Patient advised to discuss non-opioid treatment plan with their provider. Functional ability and status Nutritional status Physical activity Advanced directives List of other physicians Hospitalizations, surgeries, and ER visits in previous 12 months Vitals Screenings to include cognitive, depression, and falls Referrals and appointments  In addition, I have reviewed and discussed with patient certain preventive protocols, quality metrics, and best practice recommendations. A written personalized care plan for preventive services as well as general preventive health recommendations were provided to patient.   Due to this being a telephonic visit, the after visit summary with patients personalized plan was offered to patient via mail or my-chart. Patient  would like to access on my-chart.     Loma Messing, LPN   08/31/5807   Nurse Health Advisor  Nurse Notes: none

## 2021-10-02 ENCOUNTER — Ambulatory Visit (INDEPENDENT_AMBULATORY_CARE_PROVIDER_SITE_OTHER): Payer: PPO

## 2021-10-02 VITALS — Ht 67.0 in | Wt 225.0 lb

## 2021-10-02 DIAGNOSIS — Z0000 Encounter for general adult medical examination without abnormal findings: Secondary | ICD-10-CM | POA: Diagnosis not present

## 2021-10-02 NOTE — Patient Instructions (Signed)
Randy Kennedy , Thank you for taking time to complete your Medicare Wellness Visit. I appreciate your ongoing commitment to your health goals. Please review the following plan we discussed and let me know if I can assist you in the future.   Screening recommendations/referrals: Colonoscopy: up to date, completed 12/17/20, due 12/18/23 Recommended yearly ophthalmology/optometry visit for glaucoma screening and checkup Recommended yearly dental visit for hygiene and checkup  Vaccinations: Influenza vaccine: Due-May obtain vaccine at our office or your local pharmacy. Pneumococcal vaccine: up to date  Tdap vaccine: up to date , due 07/27/22 Shingles vaccine: Discuss with your local pharmacy   Covid-19: newest booster available at your local pharmacy   Advanced directives: Please bring a copy of Living Will and/or [place] for your chart.   Conditions/risks identified: see problem list   Next appointment: Follow up in one year for your annual wellness visit. 10/05/22 @ 10:30am, this will be a telephone visit   Preventive Care 17 Years and Older, Male Preventive care refers to lifestyle choices and visits with your health care provider that can promote health and wellness. What does preventive care include? A yearly physical exam. This is also called an annual well check. Dental exams once or twice a year. Routine eye exams. Ask your health care provider how often you should have your eyes checked. Personal lifestyle choices, including: Daily care of your teeth and gums. Regular physical activity. Eating a healthy diet. Avoiding tobacco and drug use. Limiting alcohol use. Practicing safe sex. Taking low doses of aspirin every day. Taking vitamin and mineral supplements as recommended by your health care provider. What happens during an annual well check? The services and screenings done by your health care provider during your annual well check will depend on your age,  overall health, lifestyle risk factors, and family history of disease. Counseling  Your health care provider may ask you questions about your: Alcohol use. Tobacco use. Drug use. Emotional well-being. Home and relationship well-being. Sexual activity. Eating habits. History of falls. Memory and ability to understand (cognition). Work and work Statistician. Screening  You may have the following tests or measurements: Height, weight, and BMI. Blood pressure. Lipid and cholesterol levels. These may be checked every 5 years, or more frequently if you are over 76 years old. Skin check. Lung cancer screening. You may have this screening every year starting at age 88 if you have a 30-pack-year history of smoking and currently smoke or have quit within the past 15 years. Fecal occult blood test (FOBT) of the stool. You may have this test every year starting at age 28. Flexible sigmoidoscopy or colonoscopy. You may have a sigmoidoscopy every 5 years or a colonoscopy every 10 years starting at age 109. Prostate cancer screening. Recommendations will vary depending on your family history and other risks. Hepatitis C blood test. Hepatitis B blood test. Sexually transmitted disease (STD) testing. Diabetes screening. This is done by checking your blood sugar (glucose) after you have not eaten for a while (fasting). You may have this done every 1-3 years. Abdominal aortic aneurysm (AAA) screening. You may need this if you are a current or former smoker. Osteoporosis. You may be screened starting at age 10 if you are at high risk. Talk with your health care provider about your test results, treatment options, and if necessary, the need for more tests. Vaccines  Your health care provider may recommend certain vaccines, such as: Influenza vaccine. This is recommended every year. Tetanus,  diphtheria, and acellular pertussis (Tdap, Td) vaccine. You may need a Td booster every 10 years. Zoster vaccine.  You may need this after age 96. Pneumococcal 13-valent conjugate (PCV13) vaccine. One dose is recommended after age 9. Pneumococcal polysaccharide (PPSV23) vaccine. One dose is recommended after age 50. Talk to your health care provider about which screenings and vaccines you need and how often you need them. This information is not intended to replace advice given to you by your health care provider. Make sure you discuss any questions you have with your health care provider. Document Released: 08/09/2015 Document Revised: 04/01/2016 Document Reviewed: 05/14/2015 Elsevier Interactive Patient Education  2017 [place] Prevention in the Home Falls can cause injuries. They can happen to people of all ages. There are many things you can do to make your home safe and to help prevent falls. What can I do on the outside of my home? Regularly fix the edges of walkways and driveways and fix any cracks. Remove anything that might make you trip as you walk through a door, such as a raised step or threshold. Trim any bushes or trees on the path to your home. Use bright outdoor lighting. Clear any walking paths of anything that might make someone trip, such as rocks or tools. Regularly check to see if handrails are loose or broken. Make sure that both sides of any steps have handrails. Any raised decks and porches should have guardrails on the edges. Have any leaves, snow, or ice cleared regularly. Use sand or salt on walking paths during winter. Clean up any spills in your garage right away. This includes oil or grease spills. What can I do in the bathroom? Use night lights. Install grab bars by the toilet and in the tub and shower. Do not use towel bars as grab bars. Use non-skid mats or decals in the tub or shower. If you need to sit down in the shower, use a plastic, non-slip stool. Keep the floor dry. Clean up any water that spills on the floor as soon as it happens. Remove soap  buildup in the tub or shower regularly. Attach bath mats securely with double-sided non-slip rug tape. Do not have throw rugs and other things on the floor that can make you trip. What can I do in the bedroom? Use night lights. Make sure that you have a light by your bed that is easy to reach. Do not use any sheets or blankets that are too big for your bed. They should not hang down onto the floor. Have a firm chair that has side arms. You can use this for support while you get dressed. Do not have throw rugs and other things on the floor that can make you trip. What can I do in the kitchen? Clean up any spills right away. Avoid walking on wet floors. Keep items that you use a lot in easy-to-reach places. If you need to reach something above you, use a strong step stool that has a grab bar. Keep electrical cords out of the way. Do not use floor polish or wax that makes floors slippery. If you must use wax, use non-skid floor wax. Do not have throw rugs and other things on the floor that can make you trip. What can I do with my stairs? Do not leave any items on the stairs. Make sure that there are handrails on both sides of the stairs and use them. Fix handrails that are broken or loose. Make  sure that handrails are as long as the stairways. Check any carpeting to make sure that it is firmly attached to the stairs. Fix any carpet that is loose or worn. Avoid having throw rugs at the top or bottom of the stairs. If you do have throw rugs, attach them to the floor with carpet tape. Make sure that you have a light switch at the top of the stairs and the bottom of the stairs. If you do not have them, ask someone to add them for you. What else can I do to help prevent falls? Wear shoes that: Do not have high heels. Have rubber bottoms. Are comfortable and fit you well. Are closed at the toe. Do not wear sandals. If you use a stepladder: Make sure that it is fully opened. Do not climb a closed  stepladder. Make sure that both sides of the stepladder are locked into place. Ask someone to hold it for you, if possible. Clearly mark and make sure that you can see: Any grab bars or handrails. First and last steps. Where the edge of each step is. Use tools that help you move around (mobility aids) if they are needed. These include: Canes. Walkers. Scooters. Crutches. Turn on the lights when you go into a dark area. Replace any light bulbs as soon as they burn out. Set up your furniture so you have a clear path. Avoid moving your furniture around. If any of your floors are uneven, fix them. If there are any pets around you, be aware of where they are. Review your medicines with your doctor. Some medicines can make you feel dizzy. This can increase your chance of falling. Ask your doctor what other things that you can do to help prevent falls. This information is not intended to replace advice given to you by your health care provider. Make sure you discuss any questions you have with your health care provider. Document Released: 05/09/2009 Document Revised: 12/19/2015 Document Reviewed: 08/17/2014 Elsevier Interactive Patient Education  2017 [employer].

## 2021-10-13 ENCOUNTER — Other Ambulatory Visit: Payer: Self-pay | Admitting: Family Medicine

## 2021-10-13 ENCOUNTER — Telehealth: Payer: Self-pay

## 2021-10-13 NOTE — Progress Notes (Signed)
? ? ?Chronic Care Management ?Pharmacy Assistant  ? ?Name: Randy Kennedy  MRN: [id number] DOB: 01-Dec-1950 ? ?Reason for Encounter: CCM (Medication Adherence and Delivery Coordination) ?  ?Recent office visits:  ?10/02/2021 - Orrin Brigham, LPN - [facility] ? ?Recent consult visits:  ?07/31/2021 - Ida Rogue, MD (Cardiology) - Message - Change (Increase): Doxazosin 2 mg 2x daily; if >140 take extra Doxazosin.  ? ?Hospital visits:  ?None in previous 6 months ? ?Medications: ?Outpatient Encounter Medications as of 10/13/2021  ?Medication Sig  ? aspirin EC 81 MG tablet Take 81 mg by mouth daily.  ? colchicine 0.6 MG tablet TAKE ONE TABLET BY MOUTH ONCE DAILY  ? doxazosin (CARDURA) 2 MG tablet Take 1 tablet (2 mg total) by mouth 2 (two) times daily.  ? fexofenadine (ALLEGRA) 180 MG tablet Take 180 mg by mouth daily.  ? furosemide (LASIX) 20 MG tablet Take 1 tablet (20 mg total) by mouth 3 (three) times a week.  ? gabapentin (NEURONTIN) 300 MG capsule Take 1 capsule (300 mg total) by mouth 2 (two) times daily AND 2 capsules (600 mg total) at bedtime.  ? hydrALAZINE (APRESOLINE) 100 MG tablet TAKE 1 AND 1/2 TABLETS BY MOUTH THREE TIMES DAILY, MAY increase TO FOUR TIMES DAILY AS NEEDED FOR HIGH BLOOD PRESSURE  ? HYDROcodone-acetaminophen (NORCO) 10-325 MG tablet TAKE ONE TABLET BY MOUTH EVERY 6 HOURS AS NEEDED  ? ibuprofen (ADVIL) 600 MG tablet Take 600 mg by mouth every 6 (six) hours as needed.  ? meloxicam (MOBIC) 15 MG tablet TAKE 1 TABLET BY MOUTH ONCE DAILY AS NEEDED FOR  GOUT  FLARE  ? nitroGLYCERIN (NITROSTAT) 0.4 MG SL tablet Place 1 tablet (0.4 mg total) under the tongue every 5 (five) minutes as needed for chest pain.  ? pravastatin (PRAVACHOL) 40 MG tablet TAKE ONE TABLET BY MOUTH ONCE DAILY  ? predniSONE (DELTASONE) 20 MG tablet Take 1 tablet (20 mg total) by mouth daily. For 1-2 days prn gout flare  ? probenecid (BENEMID) 500 MG tablet TAKE 1/2 TABLET BY MOUTH TWICE DAILY  ? valsartan (DIOVAN) 320 MG tablet  TAKE ONE TABLET BY MOUTH DAILY  ? vitamin C (ASCORBIC ACID) 500 MG tablet Take 500 mg by mouth daily.  ? ?No facility-administered encounter medications on file as of 10/13/2021.  ? ?BP Readings from Last 3 Encounters:  ?06/24/21 (!) 180/80  ?03/28/21 (!) 170/74  ?12/17/20 (!) 141/76  ?  ?Lab Results  ?Component Value Date  ? HGBA1C 5.6 10/24/2020  ?  ?Recent OV, Consult or Hospital visit:  ? ?Recent medication changes indicated:  ?Change (Increase): Doxazosin 2 mg 2x daily; if >140 take extra Doxazosin.  ? ?Last adherence delivery date: 07/17/2021     ? ?Patient is due for next adherence delivery on: 10/24/2021 ? ?Spoke with patient on 10/14/2021 reviewed medications and coordinated delivery. ? ?This delivery to include: Vials  90 Days  ?Colchicine 0.'6mg'$  take 1 tablet at breakfast  ?Probenecid 500 mg  - ? tablet BID ?Pravastatin 40 mg  - 1 tablet qpm ?Valsartan 320 mg take  1 tablet at breakfast ?Hydralazine 100 mg- Take 1.5 tablets TID may increase to QID if needed for blood pressure ?Gabapentin '300mg'$  take 1 tablet at breakfast 1 tablet evening meal, 2 tablets at bedtime ?Hydrocodone 10/'325mg'$  take 1 tablet every 6 hours prn pain  ?Doxazosin 2 mg 2x daily; if >140 take extra Doxazosin ? ?Any concerns about your medications? No ? ?How often do you forget or accidentally miss a  dose? Never ? ?Do you use a pillbox? Yes ? ?Is patient in packaging Yes ? If yes ? What is the date on your next pill pack? 10/14/2021 Breakfast  ? Any concerns or issues with your packaging? No ? ?Refills requested from providers include: ?Colchicine 0.'6mg'$  take 1 tablet at breakfast  ?Valsartan 320 mg take  1 tablet at breakfast ?Hydrocodone 10/'325mg'$  take 1 tablet every 6 hours prn pain  ? ?Confirmed delivery date of 10/24/2021, advised patient that pharmacy will contact them the morning of delivery.  ? ?Recent blood pressure readings are as follows: Patient did not have his readings at the time.  ? ?Annual wellness visit in last year? Yes  10/02/2021 ?Most Recent BP reading: 180/80 on 06/24/2021 ? ?Charlene Brooke, CPP notified ? ?Marijean Niemann, RMA ?Clinical Pharmacy Assistant ?[phone] ? ?LM 03/21 ? ? ?

## 2021-10-13 NOTE — Telephone Encounter (Signed)
Name of Medication: Hydrocodone-APAP ?Name of Pharmacy: [facility] ?Last Fill or Written Date and Quantity: 09/17/21, #90 ?Last Office Visit and Type: 10/28/20, [facility] prt 2 ?Next Office Visit and Type: none ?Last Controlled Substance Agreement Date: 09/29/16 ?Last UDS: 03/10/19 ? ?Colchicine last filled:  07/24/21, #90 ? ? ?

## 2021-10-14 NOTE — Telephone Encounter (Signed)
ERx 

## 2021-11-11 ENCOUNTER — Other Ambulatory Visit: Payer: Self-pay | Admitting: Family Medicine

## 2021-11-11 NOTE — Telephone Encounter (Signed)
ERx ?Pt due for OV ?

## 2021-11-11 NOTE — Telephone Encounter (Signed)
Name of Medication: Hydrocodone-APAP ?Name of Pharmacy: [facility] ?Last Fill or Written Date and Quantity: 10/20/21, #90 ?Last Office Visit and Type: 10/28/20, [facility] prt 2; 10-02-21 [facility] with Health Nurse ?Next Office Visit and Type: none ?Last Controlled Substance Agreement Date: 09/29/16 ?Last UDS: 03/10/19 ?

## 2021-12-11 ENCOUNTER — Other Ambulatory Visit: Payer: Self-pay | Admitting: Family Medicine

## 2021-12-11 NOTE — Telephone Encounter (Signed)
ERx 

## 2021-12-11 NOTE — Telephone Encounter (Signed)
Name of Medication: Hydrocodone-APAP Name of Pharmacy: [facility] Last Fill or Written Date and Quantity: 11/14/21, #90 Last Office Visit and Type: 10/28/20, [facility] prt 2 Next Office Visit and Type: 12/16/21, f/u Last Controlled Substance Agreement Date: 09/29/16 Last UDS: 03/10/19

## 2021-12-16 ENCOUNTER — Ambulatory Visit (INDEPENDENT_AMBULATORY_CARE_PROVIDER_SITE_OTHER): Payer: PPO | Admitting: Family Medicine

## 2021-12-16 ENCOUNTER — Encounter: Payer: Self-pay | Admitting: Family Medicine

## 2021-12-16 VITALS — BP 156/78 | HR 44 | Temp 97.7°F | Ht 67.0 in | Wt 228.1 lb

## 2021-12-16 DIAGNOSIS — G4733 Obstructive sleep apnea (adult) (pediatric): Secondary | ICD-10-CM

## 2021-12-16 DIAGNOSIS — G894 Chronic pain syndrome: Secondary | ICD-10-CM

## 2021-12-16 DIAGNOSIS — R001 Bradycardia, unspecified: Secondary | ICD-10-CM

## 2021-12-16 DIAGNOSIS — I10 Essential (primary) hypertension: Secondary | ICD-10-CM

## 2021-12-16 DIAGNOSIS — M1A09X0 Idiopathic chronic gout, multiple sites, without tophus (tophi): Secondary | ICD-10-CM

## 2021-12-16 DIAGNOSIS — G8929 Other chronic pain: Secondary | ICD-10-CM | POA: Insufficient documentation

## 2021-12-16 DIAGNOSIS — F1120 Opioid dependence, uncomplicated: Secondary | ICD-10-CM | POA: Diagnosis not present

## 2021-12-16 DIAGNOSIS — M5416 Radiculopathy, lumbar region: Secondary | ICD-10-CM

## 2021-12-16 DIAGNOSIS — G90522 Complex regional pain syndrome I of left lower limb: Secondary | ICD-10-CM | POA: Diagnosis not present

## 2021-12-16 NOTE — Assessment & Plan Note (Signed)
[place] CSRS.  Update UDS today.  Stable period on current regimen - continue.

## 2021-12-16 NOTE — Assessment & Plan Note (Signed)
Appreciate PM&R care. He sees Dr Alba Destine.

## 2021-12-16 NOTE — Progress Notes (Signed)
Patient ID: Randy Kennedy, male    DOB: 04-Feb-1951, 71 y.o.   MRN: [id number]  This visit was conducted in person.  BP (!) 156/78 (BP Location: Right Arm, Cuff Size: Large)   Pulse (!) 44   Temp 97.7 F (36.5 C) (Temporal)   Ht '5\' 7"'$  (1.702 m)   Wt 228 lb 2 oz (103.5 kg)   SpO2 95%   BMI 35.73 kg/m    CC: chronic pain management f/u visit  Subjective:   HPI: Randy Kennedy is a 71 y.o. male presenting on 12/16/2021 for Pain Management (Here for chronic pain f/u.)   Last seen 10/28/2020.  He did receive medicare wellness visit through health advisor 09/2021.   Chronic back and leg pain - has seen PM&R Alba Destine) s/p L L5/S1 TFESI 09/2020 with 50% improvement, L L4/5 TFESI 10/2020 without benefit. No further lumbar ESI recommended. Continues gabapentin 300/300/'600mg'$  daily. Continues hydrocodone 10/'325mg'$  1 tab TID prn #90/mo. Also has known cervicalgia with L sided cervical radiculitis. Referred to neurosurgery 11/2020 - didn't see. Also received L C6/7 TFESI - this was not approved by insurance. PT, chiropractor has not helped.   Known gout - on colchicine 1 tab daily and meloxicam PRN. No recent flares in the past year.   OSA - can't tolerate CPAP/BiPAP due to allergies.   HTN - sees cardiology for difficult to control hypertension with multiple medication intolerances (lisinopril, hctz, amlodipine, spironolactone, doxazosin). Currently on doxazosin '2mg'$  BID (better tolerated this go around, although does notice some rash in groin and legs on this medication), lasix '20mg'$  3x/wk (for pulm HTN - not taking), hydralazine '150mg'$  TID, QID if needed, and valsartan '320mg'$  daily. Last saw Dr Rockey Situ 05/2021. He does check BP at home - 160/70s as well. No HA, vision changes, CP/tightness, leg swelling. Occasional dyspnea. Has f/u with Dr Rockey Situ in 2 wks.   L drooping eyelid followed by ophtho ([place]).  Abrasion to left lower leg while moving grandfather clock.      Relevant past medical,  surgical, family and social history reviewed and updated as indicated. Interim medical history since our last visit reviewed. Allergies and medications reviewed and updated. Outpatient Medications Prior to Visit  Medication Sig Dispense Refill   aspirin EC 81 MG tablet Take 81 mg by mouth daily.     colchicine 0.6 MG tablet TAKE ONE TABLET BY MOUTH ONCE DAILY 90 tablet 0   doxazosin (CARDURA) 2 MG tablet Take 1 tablet (2 mg total) by mouth 2 (two) times daily. 180 tablet 3   fexofenadine (ALLEGRA) 180 MG tablet Take 180 mg by mouth daily.     gabapentin (NEURONTIN) 300 MG capsule Take 1 capsule (300 mg total) by mouth 2 (two) times daily AND 2 capsules (600 mg total) at bedtime. 360 capsule 3   hydrALAZINE (APRESOLINE) 100 MG tablet TAKE 1 AND 1/2 TABLETS BY MOUTH THREE TIMES DAILY, MAY increase TO FOUR TIMES DAILY AS NEEDED FOR HIGH BLOOD PRESSURE 150 tablet 11   HYDROcodone-acetaminophen (NORCO) 10-325 MG tablet TAKE ONE TABLET BY MOUTH every SIX hours AS NEEDED. Please contact [facility] for an appt, Call [phone]. 90 tablet 0   meloxicam (MOBIC) 15 MG tablet TAKE 1 TABLET BY MOUTH ONCE DAILY AS NEEDED FOR  GOUT  FLARE 30 tablet 0   nitroGLYCERIN (NITROSTAT) 0.4 MG SL tablet Place 1 tablet (0.4 mg total) under the tongue every 5 (five) minutes as needed for chest pain. 25 tablet 3  pravastatin (PRAVACHOL) 40 MG tablet TAKE ONE TABLET BY MOUTH ONCE DAILY 90 tablet 3   predniSONE (DELTASONE) 20 MG tablet Take 1 tablet (20 mg total) by mouth daily. For 1-2 days prn gout flare 20 tablet 0   probenecid (BENEMID) 500 MG tablet TAKE 1/2 TABLET BY MOUTH TWICE DAILY 90 tablet 3   valsartan (DIOVAN) 320 MG tablet TAKE ONE TABLET BY MOUTH DAILY 90 tablet 0   vitamin C (ASCORBIC ACID) 500 MG tablet Take 500 mg by mouth daily.     furosemide (LASIX) 20 MG tablet Take 1 tablet (20 mg total) by mouth 3 (three) times a week. 39 tablet 3   ibuprofen (ADVIL) 600 MG tablet Take 600 mg by mouth  every 6 (six) hours as needed.     No facility-administered medications prior to visit.     Per HPI unless specifically indicated in ROS section below Review of Systems  Objective:  BP (!) 156/78 (BP Location: Right Arm, Cuff Size: Large)   Pulse (!) 44   Temp 97.7 F (36.5 C) (Temporal)   Ht '5\' 7"'$  (1.702 m)   Wt 228 lb 2 oz (103.5 kg)   SpO2 95%   BMI 35.73 kg/m   Wt Readings from Last 3 Encounters:  12/16/21 228 lb 2 oz (103.5 kg)  10/02/21 225 lb (102.1 kg)  06/24/21 234 lb 4 oz (106.3 kg)      Physical Exam Constitutional:      Appearance: Normal appearance. He is not ill-appearing.     Comments: Ambulates with cane  HENT:     Mouth/Throat:     Mouth: Mucous membranes are moist.     Pharynx: Oropharynx is clear. No oropharyngeal exudate or posterior oropharyngeal erythema.  Eyes:     Extraocular Movements: Extraocular movements intact.     Pupils: Pupils are equal, round, and reactive to light.  Cardiovascular:     Rate and Rhythm: Regular rhythm. Bradycardia present.     Pulses: Normal pulses.     Heart sounds: Murmur (2/6 systolic) heard.  Pulmonary:     Effort: Pulmonary effort is normal. No respiratory distress.     Breath sounds: Normal breath sounds. No wheezing, rhonchi or rales.  Musculoskeletal:     Right lower leg: No edema.     Left lower leg: No edema.  Skin:    General: Skin is warm and dry.     Findings: No rash.  Neurological:     Mental Status: He is alert.  Psychiatric:        Mood and Affect: Mood normal.        Behavior: Behavior normal.      Results for orders placed or performed in visit on 03/28/21  CBC  Result Value Ref Range   WBC 6.4 4.0 - 10.5 K/uL   RBC 4.71 4.22 - 5.81 Mil/uL   Platelets 143.0 (L) 150.0 - 400.0 K/uL   Hemoglobin 14.8 13.0 - 17.0 g/dL   HCT 43.0 39.0 - 52.0 %   MCV 91.2 78.0 - 100.0 fl   MCHC 34.4 30.0 - 36.0 g/dL   RDW 13.2 11.5 - 15.5 %  Comprehensive metabolic panel  Result Value Ref Range   Sodium  142 135 - 145 mEq/L   Potassium 4.0 3.5 - 5.1 mEq/L   Chloride 103 96 - 112 mEq/L   CO2 30 19 - 32 mEq/L   Glucose, Bld 111 (H) 70 - 99 mg/dL   BUN 16 6 - 23 mg/dL  Creatinine, Ser 1.25 0.40 - 1.50 mg/dL   Total Bilirubin 1.2 0.2 - 1.2 mg/dL   Alkaline Phosphatase 56 39 - 117 U/L   AST 26 0 - 37 U/L   ALT 33 0 - 53 U/L   Total Protein 7.5 6.0 - 8.3 g/dL   Albumin 4.4 3.5 - 5.2 g/dL   GFR 58.42 (L) >60.00 mL/min   Calcium 9.5 8.4 - 10.5 mg/dL  Brain natriuretic peptide  Result Value Ref Range   Pro B Natriuretic peptide (BNP) 36.0 0.0 - 100.0 pg/mL    Assessment & Plan:   Problem List Items Addressed This Visit     Encounter for chronic pain management - Primary (Chronic)    [place] CSRS.  Update UDS today.  Stable period on current regimen - continue.        Relevant Orders   DRUG MONITORING, PANEL 8 WITH CONFIRMATION, URINE   HTN (hypertension)    BP remaining chronically mildly elevated, will continue current regimen. Encouraged restarting lasix 3x/wk. He has close f/u with cards scheduled early next month.        OSA (obstructive sleep apnea)    Discussed relation of untreated OSA to pulm HTN.        Narcotic dependence ([place])   Gout    Stable period on daily colchicine with PRN meloxicam.  No recent gout flare.  Intolerant to allopurinol.        Bradycardia   RSD lower limb   Chronic pain disorder   Chronic radicular pain of lower back    Appreciate PM&R care. He sees Dr Alba Destine.          No orders of the defined types were placed in this encounter.  Orders Placed This Encounter  Procedures   DRUG MONITORING, PANEL 8 WITH CONFIRMATION, URINE    Hydrocodone     Patient Instructions  Return in 3 months for physical and fasting labs. Update urine drug screen today. Good to see you today.   Follow up plan: Return in about 3 months (around 03/18/2022) for annual exam, prior fasting for blood work.  [name], MD

## 2021-12-16 NOTE — Assessment & Plan Note (Signed)
Discussed relation of untreated OSA to pulm HTN.

## 2021-12-16 NOTE — Assessment & Plan Note (Addendum)
Stable period on daily colchicine with PRN meloxicam.  No recent gout flare.  Intolerant to allopurinol.

## 2021-12-16 NOTE — Assessment & Plan Note (Addendum)
BP remaining chronically mildly elevated, will continue current regimen. Encouraged restarting lasix 3x/wk. He has close f/u with cards scheduled early next month.

## 2021-12-16 NOTE — Patient Instructions (Addendum)
Return in 3 months for physical and fasting labs. Update urine drug screen today. Good to see you today.

## 2021-12-18 LAB — DRUG MONITORING, PANEL 8 WITH CONFIRMATION, URINE
6 Acetylmorphine: NEGATIVE ng/mL (ref <10)
Alcohol Metabolites: NEGATIVE ng/mL (ref <500)
Amphetamines: NEGATIVE ng/mL (ref <500)
Benzodiazepines: NEGATIVE ng/mL (ref <100)
Buprenorphine, Urine: NEGATIVE ng/mL (ref <5)
Buprenorphine: NEGATIVE ng/mL (ref <2)
Cocaine Metabolite: NEGATIVE ng/mL (ref <150)
Codeine: NEGATIVE ng/mL (ref <50)
Creatinine: 111.4 mg/dL (ref >=20.0)
Hydrocodone: 555 ng/mL — ABNORMAL HIGH (ref <50)
Hydromorphone: NEGATIVE ng/mL (ref <50)
MDMA: NEGATIVE ng/mL (ref <500)
Marijuana Metabolite: NEGATIVE ng/mL (ref <20)
Morphine: NEGATIVE ng/mL (ref <50)
Naloxone: NEGATIVE ng/mL (ref <2)
Norbuprenorphine: NEGATIVE ng/mL (ref <2)
Norhydrocodone: 836 ng/mL — ABNORMAL HIGH (ref <50)
Opiates: POSITIVE ng/mL — AB (ref <100)
Oxidant: NEGATIVE ug/mL (ref <200)
Oxycodone: NEGATIVE ng/mL (ref <100)
pH: 5.2 (ref 4.5–9.0)

## 2021-12-18 LAB — DM TEMPLATE

## 2021-12-28 NOTE — Progress Notes (Unsigned)
Date:  12/29/2021   ID:  Randy Kennedy, DOB 1951/07/06, MRN [id number]  Patient Location:  [address] [address]   Provider location:   [facility], [place] office  PCP:  [name], MD  Cardiologist:  Arvid [facility]  Chief Complaint  Patient presents with   6 month follow up     Patient c/o shortness of breath and chest pain that comes and goes. Medications reviewed by the patient verbally.     History of Present Illness:    Randy Kennedy is a 71 y.o. male  past medical history of RLS,  chronic burning in his feet/neuropathy, Chronic legs and arm numbness and tingling.  hypertension, medication intolerances hyperlipidemia,  OSA  ,not been using his bipap/cpap,  Episodes of atypical chest tightness.  Chronic SOB  Who presents for follow-up of his bradycardia,  Hypertension  chest pain  LOV 05/2021 In the past clinic visits, difficulty controlling his hypertension  Presents today with his wife She reports that he sleeps in a recliner secondary to sleep apnea Unable to tolerate CPAP Significant nasal congestion/allergies At home 150s/70s, compliant with his medications Minimal leg edema Walks with a cane, no regular exercise program  Continues on valsartan, hydralazine, Cardura 2 twice daily Numerous medication intolerances Asymptomatic from his bradycardia  covid 06/2020 in to Jan 2022  Long discussion concerning medication intolerances Reviewed on today's visit Headaches, imdur,  Stopped cardura, 4 mg twice daily may have caused a rash Chlorthalidone causes gout Ca channel blocker: Amlodipine (rash) and Procardia leg swelling Clonidine /b-blocker: bradycardia spironolactone, unclear intolerance  EKG personally reviewed by myself on todays visit Normal sinus rhythm with rate 50 bpm right bundle branch block  Echo 06/11/2017 Normal EF, elevated RVSP  Echocardiogram November 2018 Left ventricle: The  cavity size was normal. There was moderate   concentric hypertrophy. Systolic function was normal. The   estimated ejection fraction was in the range of 55% to 60%. Wall   motion was normal; there were no regional wall motion   abnormalities. Features are consistent with a pseudonormal left   ventricular filling pattern, with concomitant abnormal relaxation   and increased filling pressure (grade 2 diastolic dysfunction). - Aortic valve: There was trivial regurgitation. - Mitral valve: There was mild regurgitation. - Left atrium: The atrium was mildly dilated. - Right atrium: The atrium was moderately dilated. - Pulmonary arteries: Systolic pressure was moderately increased.   PA peak pressure: 48 mm Hg (S).   Past Medical History:  Diagnosis Date   Allergic rhinitis    Arthritis    Chest pain    a. 11/2011 reportedly normal stress test performed in [place].   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 moderate, AHI 37.4 increased to 80 in REM, on BiPAP 14/10, 97% compliance >4 hrs (06/2013); b. Does not tolerate CPAP.   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 Treated with vicodin, prior tried fentanyl and butrans.   Systolic murmur    Wears dentures    Partial lower   Wears hearing aid in both ears    Does not wear all the time   Past Surgical History:  Procedure Laterality Date   CATARACT EXTRACTION W/PHACO Right 11/26/2020   Procedure: CATARACT EXTRACTION PHACO AND INTRAOCULAR LENS PLACEMENT ([facility]) RIGHT VIVITY TORIC LENS 7.17 00:49.6;  Surgeon: Birder Robson, MD;  Location: [facility];  Service: Ophthalmology;  Laterality: Right;  Latex Sleep Apnea   CATARACT EXTRACTION W/PHACO Left 12/10/2020   Procedure: CATARACT EXTRACTION PHACO AND INTRAOCULAR LENS PLACEMENT ([place]) LEFT VIVITY;  Surgeon: Birder Robson, MD;  Location: [facility] [facility];  Service: Ophthalmology;  Laterality: Left;  Latex Sleep apnea 5.37 00:32.7   COLONOSCOPY  02/2011   ext hem, benign polyp, rpt 5 yrs ([place])   COLONOSCOPY WITH PROPOFOL N/A 12/17/2020   multiple TA, ext hem, rpt 3 yrs ([place], Tally Due, MD)   LEG SURGERY  x5   left - after fall at work (3.5 stories)   [place]   jaw fracture - horse kick   MOHS SURGERY Right 07/03/2021   Ear   TYMPANIC MEMBRANE REPAIR Left 1994   otosclerosis     Current Meds  Medication Sig   aspirin EC 81 MG tablet Take 81 mg by mouth daily.   colchicine 0.6 MG tablet TAKE ONE TABLET BY MOUTH ONCE DAILY   doxazosin (CARDURA) 2 MG tablet Take 1 tablet (2 mg total) by mouth 2 (two) times daily.   fexofenadine (ALLEGRA) 180 MG tablet Take 180 mg by mouth daily.   furosemide (LASIX) 20 MG tablet Take 20 mg by mouth 3 (three) times a week.   gabapentin (NEURONTIN) 300 MG capsule Take 1 capsule (300 mg total) by mouth 2 (two) times daily AND 2 capsules (600 mg total) at bedtime.   hydrALAZINE (APRESOLINE) 100 MG tablet TAKE 1 AND 1/2 TABLETS BY MOUTH THREE TIMES DAILY, MAY increase TO FOUR TIMES DAILY AS NEEDED FOR HIGH BLOOD PRESSURE    HYDROcodone-acetaminophen (NORCO) 10-325 MG tablet TAKE ONE TABLET BY MOUTH every SIX hours AS NEEDED. Please contact [facility] for an appt, Call [phone].   meloxicam (MOBIC) 15 MG tablet TAKE 1 TABLET BY MOUTH ONCE DAILY AS NEEDED FOR  GOUT  FLARE   nitroGLYCERIN (NITROSTAT) 0.4 MG SL tablet Place 1 tablet (0.4 mg total) under the tongue every 5 (five) minutes as needed for chest pain.   pravastatin (PRAVACHOL) 40 MG tablet TAKE ONE TABLET BY MOUTH ONCE DAILY   predniSONE (DELTASONE) 20 MG tablet Take 1 tablet (20 mg total) by mouth daily. For 1-2 days prn gout flare   probenecid (BENEMID) 500 MG tablet TAKE 1/2 TABLET BY MOUTH TWICE DAILY   valsartan (DIOVAN) 320 MG tablet TAKE ONE TABLET BY MOUTH DAILY   vitamin C (ASCORBIC ACID) 500 MG tablet Take 500 mg by mouth daily.  Allergies:   Amlodipine, Allopurinol, Lisinopril-hydrochlorothiazide, Procardia [nifedipine], Spironolactone, Doxazosin mesylate, Latex, Nortriptyline, and Uloric [febuxostat]   Social History   Tobacco Use   Smoking status: Former    Packs/day: 1.00    Years: 4.00    Pack years: 4.00    Types: Pipe, Cigarettes    Quit date: 07/27/1978    Years since quitting: 43.4   Smokeless tobacco: Never  Vaping Use   Vaping Use: Never used  Substance Use Topics   Alcohol use: No    Alcohol/week: 0.0 standard drinks   Drug use: No     Family Hx: The patient's family history includes Cancer in his mother and paternal uncle; Cancer (age of onset: 75) in his father; Hypertension in his mother. There is no history of Diabetes, Stroke, or CAD.  ROS:   Please see the history of present illness.    Review of Systems  Constitutional: Negative.   Respiratory: Negative.    Cardiovascular: Negative.   Gastrointestinal: Negative.   Musculoskeletal: Negative.   Neurological: Negative.        Numb and tingling  Psychiatric/Behavioral: Negative.    All other systems reviewed and are negative.   Labs/Other  Tests and Data Reviewed:    Recent Labs: 03/28/2021: ALT 33; BUN 16; Creatinine, Ser 1.25; Hemoglobin 14.8; Platelets 143.0; Potassium 4.0; Pro B Natriuretic peptide (BNP) 36.0; Sodium 142   Recent Lipid Panel Lab Results  Component Value Date/Time   CHOL 186 10/24/2020 08:54 AM   CHOL 130 02/09/2012 12:00 AM   TRIG 165.0 (H) 10/24/2020 08:54 AM   TRIG 142 02/09/2012 12:00 AM   HDL 76.90 10/24/2020 08:54 AM   CHOLHDL 2 10/24/2020 08:54 AM   LDLCALC 76 10/24/2020 08:54 AM   LDLCALC 47 02/09/2012 12:00 AM   LDLDIRECT 90.0 07/01/2018 09:35 AM    Wt Readings from Last 3 Encounters:  12/29/21 232 lb 4 oz (105.3 kg)  12/16/21 228 lb 2 oz (103.5 kg)  10/02/21 225 lb (102.1 kg)     Exam:    BP (!) 156/68 (BP Location: Left Arm, Patient Position: Sitting, Cuff Size: Normal)   Pulse (!) 50   Ht '5\' 7"'$  (1.702 m)   Wt 232 lb 4 oz (105.3 kg)   SpO2 97%   BMI 36.38 kg/m  Constitutional:  oriented to person, place, and time. No distress.  HENT:  Head: Grossly normal Eyes:  no discharge. No scleral icterus.  Neck: No JVD, no carotid bruits  Cardiovascular: Regular rate and rhythm, no murmurs appreciated Pulmonary/Chest: Clear to auscultation bilaterally, no wheezes or rails Abdominal: Soft.  no distension.  no tenderness.  Musculoskeletal: Normal range of motion Neurological:  normal muscle tone. Coordination normal. No atrophy Skin: Skin warm and dry Psychiatric: normal affect, pleasant  ASSESSMENT & PLAN:    Morbid obesity (HCC) Weight continues to trend higher likely contributing to shortness of breath, fatigue Recommended more regular walking program, calorie restriction  Essential hypertension Numbers improving, down to 578 systolic on a regular basis Very difficult to treat given Numerous med intolerances Recommend he continue Cardura 2 twice daily, hydralazine 150 3 times daily, high-dose valsartan hand with Lasix 3 days a week Lab work ordered today  OSA (obstructive  sleep apnea) Not on cpap Sleeps in recliner Discussed reading about inspire device and call us if he is interested  Mixed hyperlipidemia Prior numbers above goal, medication compliance recommended, weight loss Repeat lipid panel ordered  Chronic fatigue Not wearing his CPAP at night no  regular exercise Recommend walking program, weight loss through calorie restriction  RLS (restless legs syndrome) Chronic stable sx  SOB (shortness of breath) Likely from deconditioning, obesity,  Lasix 3 days a week Weight loss recommended   Total encounter time more than 30 minutes  Greater than 50% was spent in counseling and coordination of care with the patient    Signed, Ida Rogue, MD  12/29/2021 9:05 AM    [place] Office [address], [place], [place] [address]

## 2021-12-29 ENCOUNTER — Encounter: Payer: Self-pay | Admitting: Cardiovascular Disease

## 2021-12-29 ENCOUNTER — Other Ambulatory Visit
Admission: RE | Admit: 2021-12-29 | Discharge: 2021-12-29 | Disposition: A | Discharge status: Home / Self Care | Payer: PPO | Attending: Cardiovascular Disease | Admitting: Cardiovascular Disease

## 2021-12-29 ENCOUNTER — Ambulatory Visit: Payer: PPO | Admitting: Cardiovascular Disease

## 2021-12-29 VITALS — BP 156/68 | HR 50 | Ht 67.0 in | Wt 232.2 lb

## 2021-12-29 DIAGNOSIS — G4733 Obstructive sleep apnea (adult) (pediatric): Secondary | ICD-10-CM

## 2021-12-29 DIAGNOSIS — G2581 Restless legs syndrome: Secondary | ICD-10-CM | POA: Diagnosis not present

## 2021-12-29 DIAGNOSIS — R0602 Shortness of breath: Secondary | ICD-10-CM

## 2021-12-29 DIAGNOSIS — E782 Mixed hyperlipidemia: Secondary | ICD-10-CM

## 2021-12-29 DIAGNOSIS — I10 Essential (primary) hypertension: Secondary | ICD-10-CM | POA: Insufficient documentation

## 2021-12-29 LAB — COMPREHENSIVE METABOLIC PANEL
ALT: 26 U/L (ref 0–44)
AST: 27 U/L (ref 15–41)
Albumin: 4.1 g/dL (ref 3.5–5.0)
Alkaline Phosphatase: 55 U/L (ref 38–126)
Anion gap: 6 (ref 5–15)
BUN: 22 mg/dL (ref 8–23)
CO2: 26 mmol/L (ref 22–32)
Calcium: 9.3 mg/dL (ref 8.9–10.3)
Chloride: 107 mmol/L (ref 98–111)
Creatinine, Ser: 1.05 mg/dL (ref 0.61–1.24)
GFR, Estimated: >60 mL/min (ref >60)
Glucose, Bld: 101 mg/dL — ABNORMAL HIGH (ref 70–99)
Potassium: 4 mmol/L (ref 3.5–5.1)
Sodium: 139 mmol/L (ref 135–145)
Total Bilirubin: 1 mg/dL (ref 0.3–1.2)
Total Protein: 7.5 g/dL (ref 6.5–8.1)

## 2021-12-29 LAB — LIPID PANEL
Cholesterol: 171 mg/dL (ref 0–200)
HDL: 64 mg/dL (ref >40)
LDL Cholesterol: 63 mg/dL (ref 0–99)
Total CHOL/HDL Ratio: 2.7 RATIO
Triglycerides: 222 mg/dL — ABNORMAL HIGH (ref <150)
VLDL: 44 mg/dL — ABNORMAL HIGH (ref 0–40)

## 2021-12-29 MED ORDER — FUROSEMIDE 20 MG PO TABS: 20.0000 mg | ORAL_TABLET | ORAL | 3 refills | Status: DC

## 2021-12-29 MED ORDER — DOXAZOSIN MESYLATE 2 MG PO TABS: 2.0000 mg | ORAL_TABLET | Freq: Two times a day (BID) | ORAL | 3 refills | Status: DC

## 2021-12-29 NOTE — Patient Instructions (Addendum)
Read about : Inspire Sleep Apnea [employer]   Medication Instructions:  No changes  If you need a refill on your cardiac medications before your next appointment, please call your pharmacy.   Lab work:  Today: CMP, Lipids   Medical Mall Entrance at [facility] 1st desk on the right to check in (REGISTRATION)  Lab hours: Monday- Friday (7:30 am- 5:30 pm)   Testing/Procedures: No new testing needed  Follow-Up: At [facility], you and your health needs are our priority.  As part of our continuing mission to provide you with exceptional heart care, we have created designated Provider Care Teams.  These Care Teams include your primary Cardiologist (physician) and Advanced Practice Providers (APPs -  Physician Assistants and Nurse Practitioners) who all work together to provide you with the care you need, when you need it.  You will need a follow up appointment in 12 months  Providers on your designated Care Team:   Murray Hodgkins, NP Christell Faith, PA-C Cadence Kathlen Mody, [place]  COVID-19 Vaccine Information can be found at: [email or web address] For questions related to vaccine distribution or appointments, please email [email or web address] or call [phone].

## 2022-01-09 ENCOUNTER — Other Ambulatory Visit: Payer: Self-pay | Admitting: Family Medicine

## 2022-01-10 IMAGING — MR MR LUMBAR SPINE W/O CM
5 series · 30 of 48 positions shown · non-contrast
Comparison: Plain radiography of the lumbar spine August 30, 2019.

CLINICAL DATA: Cervical radiculopathy.  Lumbar radiculopathy.

EXAM:
MRI CERVICAL AND LUMBAR SPINE WITHOUT CONTRAST
TECHNIQUE: Multiplanar and multiecho pulse sequences of the cervical spine, to
include the craniocervical junction and cervicothoracic junction,
and lumbar spine, were obtained without intravenous contrast.

[Series 5: T2 · sagittal · 4.0mm · 0.81mm/px · 6 of 17 slices shown (1 of 2)]
[im 1/17]
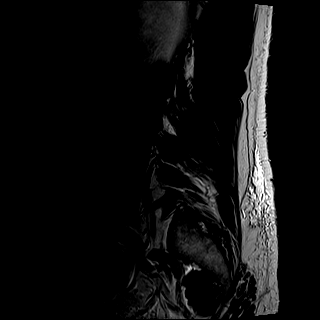
[im 4/17]
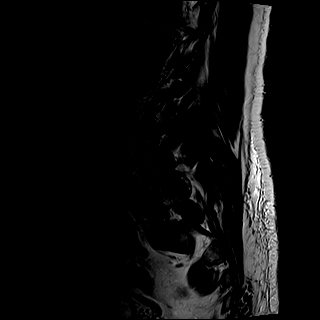
[im 7/17]
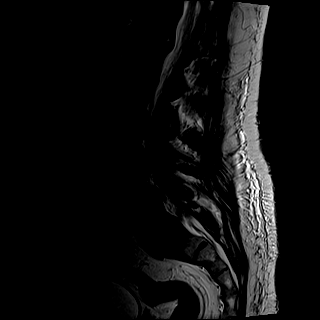
[im 10/17]
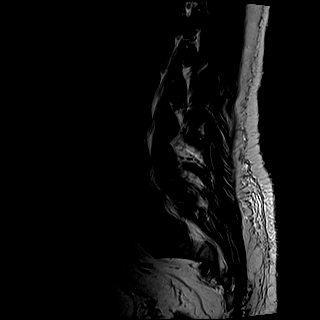
[im 13/17]
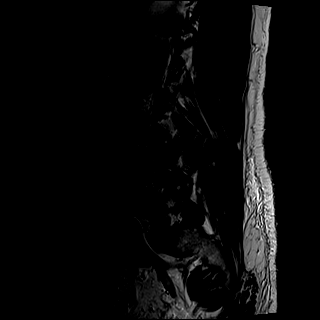
[im 17/17]
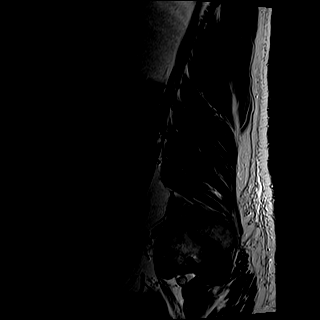

[Series 6: T1 · sagittal · 4.0mm · 0.81mm/px · 7 of 17 slices shown (1 of 2)]
[im 1/17]
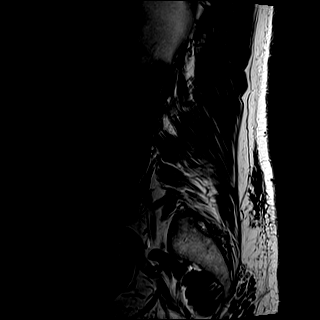
[im 3/17]
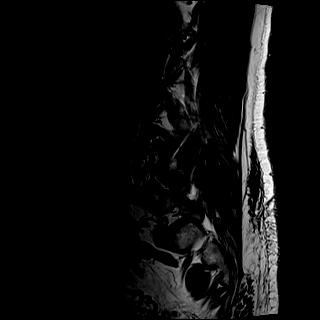
[im 6/17]
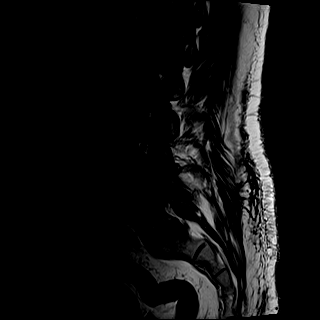
[im 9/17]
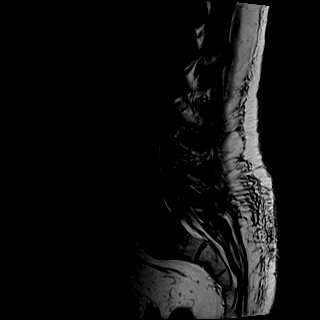
[im 11/17]
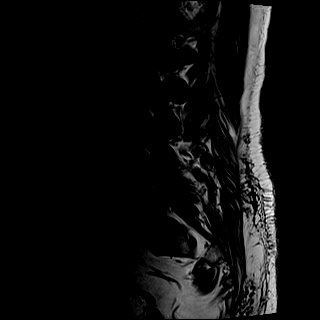
[im 14/17]
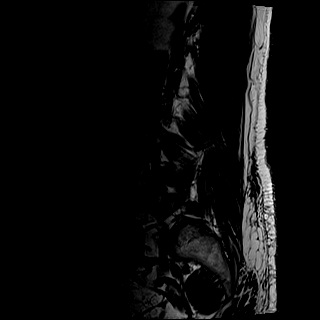
[im 17/17]
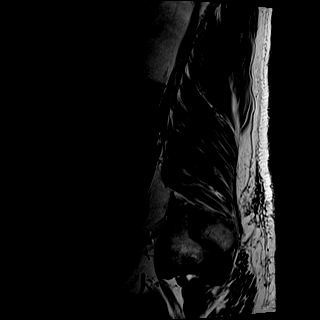

[Series 7: STIR · sagittal · 4.0mm · 0.41mm/px · 1 of 17 slices shown]
[im 1/17]
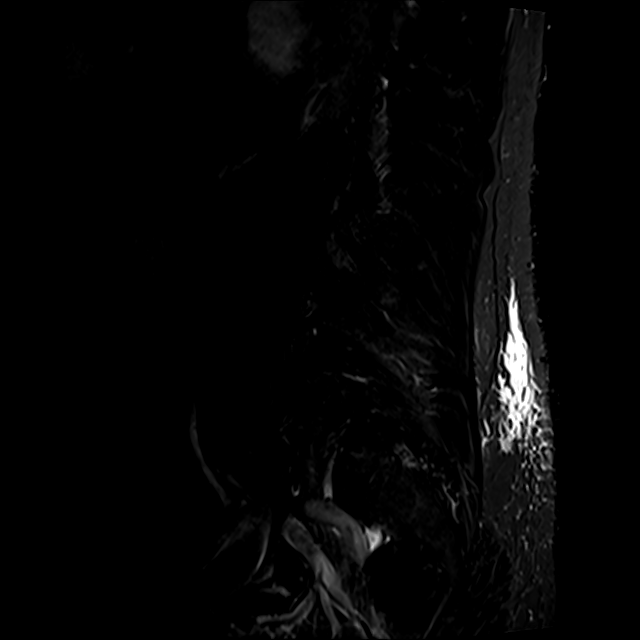

[Series 8: T2 · axial · 4.0mm · 0.78mm/px · z∈[-88,+121]mm · 8 of 36 slices shown (2 of 2)]
[im 1/36]
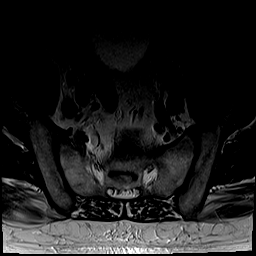
[im 6/36]
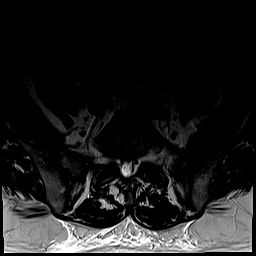
[im 11/36]
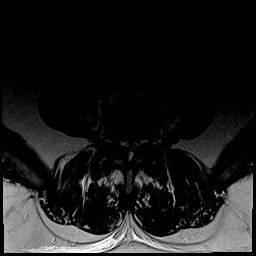
[im 17/36]
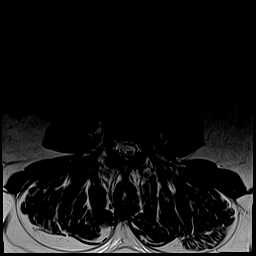
[im 19/36]
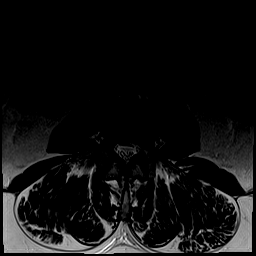
[im 25/36]
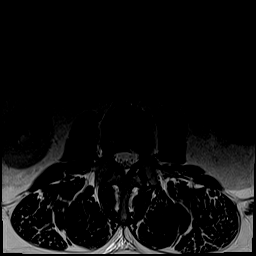
[im 30/36]
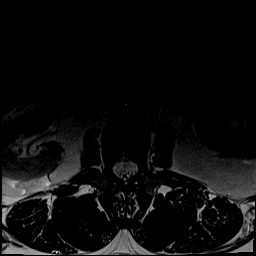
[im 36/36]
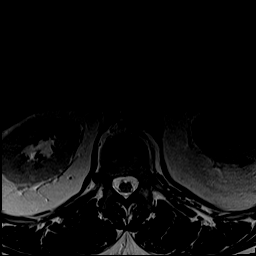

[Series 9: T1 · axial · 4.0mm · 0.39mm/px · z∈[-88,+121]mm · 8 of 36 slices shown (2 of 2)]
[im 1/36]
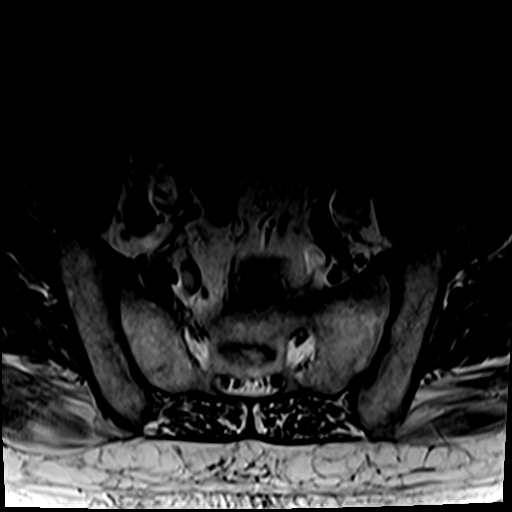
[im 6/36]
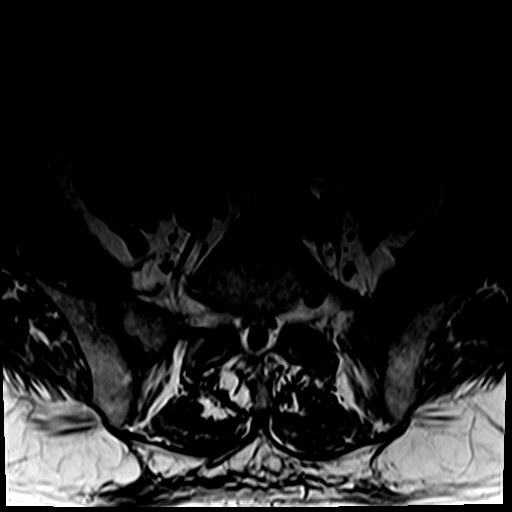
[im 11/36]
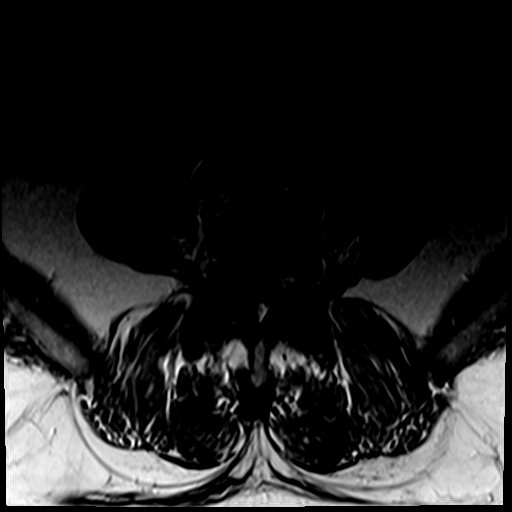
[im 17/36]
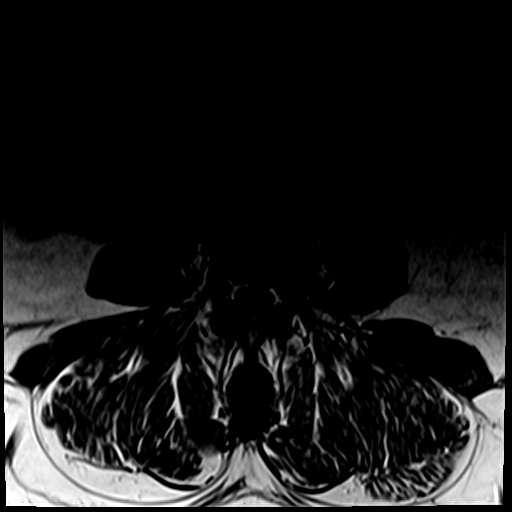
[im 19/36]
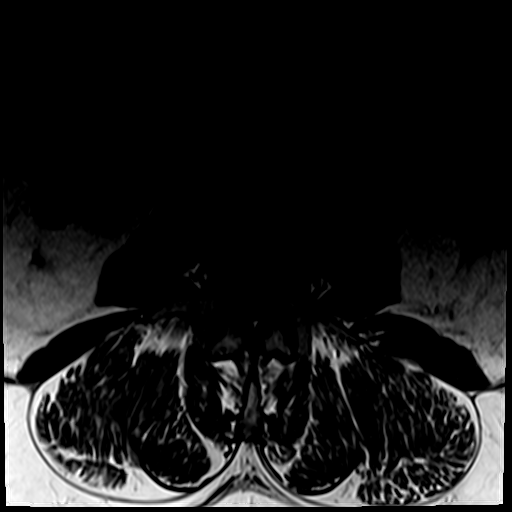
[im 25/36]
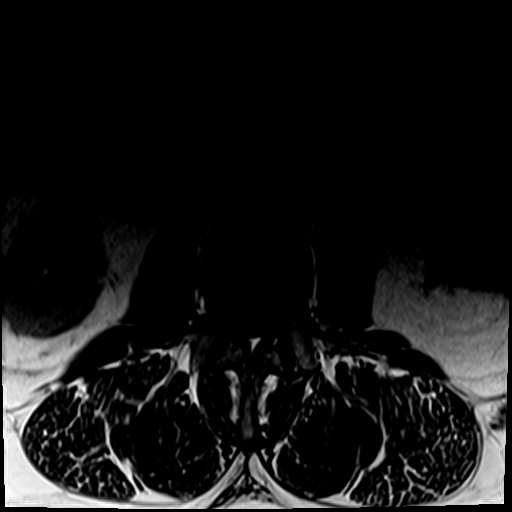
[im 30/36]
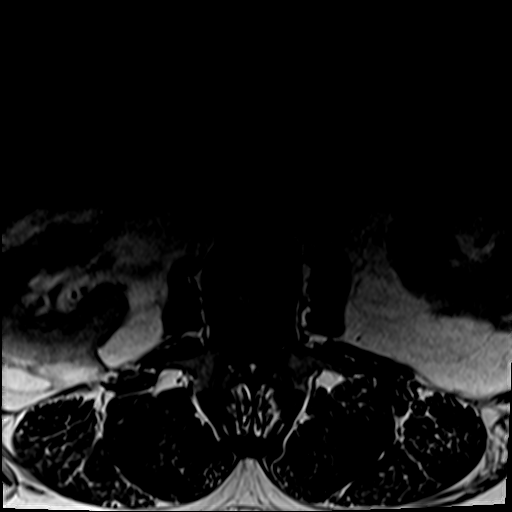
[im 36/36]
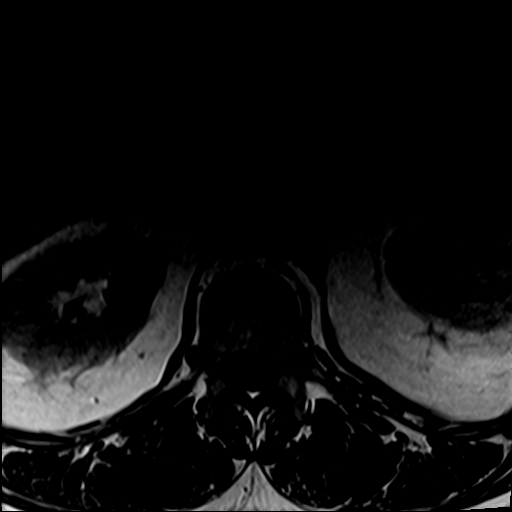

[30 of 48 positions shown; findings below may reference images not displayed]

FINDINGS: MRI CERVICAL SPINE FINDINGS

Alignment: Straightening of the cervical curvature. Small
anterolisthesis of C3 over C4.

Vertebrae: No fracture, evidence of discitis, or bone lesion.

Cord: Normal signal and morphology.

Posterior Fossa, vertebral arteries, paraspinal tissues: Negative.

Disc levels:

C2-3: Mild facet degenerative changes. No spinal canal or neural
foraminal stenosis.

C3-4: Posterior disc protrusion resulting mild spinal canal
stenosis. Uncovertebral and prominent facet degenerative changes,
left greater than right resulting in severe bilateral neural
foraminal narrowing.

C4-5: Small posterior disc protrusion without significant spinal
canal stenosis. Mild facet degenerative changes without significant
neural foraminal narrowing.

C5-6: Loss of disc height and a posterior disc osteophyte complex
resulting in mild spinal canal stenosis. Uncovertebral and facet
degenerative changes resulting in severe right and mild-to-moderate
left neural foraminal narrowing.

C6-7: Loss of disc height and a posterior disc osteophyte complex
resulting in mild spinal canal stenosis. Uncovertebral and facet
degenerative changes resulting in moderate bilateral neural
foraminal narrowing.

C7-T1: Mild facet degenerative changes. No spinal canal or neural
foraminal stenosis.

MRI LUMBAR SPINE FINDINGS

Segmentation: A transitional lumbosacral vertebra is assumed to
represent a partially sacralized L5 level. Careful correlation with
this numbering strategy prior to any procedural intervention would
be recommended.

Alignment:  Small anterolisthesis of L4 over L5.

Vertebrae: No fracture, evidence of discitis, or bone lesion. Mild
degenerative changes of the bilateral sacroiliac joints.

Conus medullaris and cauda equina: Conus extends to the L1 level.
Conus and cauda equina appear normal.

Paraspinal and other soft tissues: Negative.

Disc levels:

T12-L1: Disc bulge with superimposed small right posterolateral disc
protrusion and mild facet degenerative changes without significant
spinal canal or neural foraminal stenosis.

L1-2: Shallow disc bulge and mild facet degenerative changes
resulting mild bilateral neural foraminal narrowing. No spinal canal
stenosis.

L2-3: Disc bulge and moderate facet degenerative changes resulting
in mild bilateral neural foraminal narrowing. No significant spinal
canal stenosis.

L3-4: Disc bulge and moderate facet degenerative changes with
ligamentum flavum redundancy resulting in mild bilateral neural
foraminal narrowing. No significant spinal canal stenosis.

L4-5: Disc bulge/disc uncovering, prominent hypertrophic facet
degenerative changes and ligamentum flavum redundancy resulting
severe spinal canal stenosis. No significant neural foraminal
narrowing.

L5-S1: Moderate facet degenerative changes. No spinal canal or
neural foraminal stenosis.
IMPRESSION: 1. Multilevel cervical spondylosis with mild spinal canal stenosis
at C3-C4, C5-C6 and C6-C7.
2. Severe bilateral neural foraminal narrowing at C3-C4 and severe
right neural foraminal narrowing at C5-C6.
3. A transitional lumbosacral vertebra is assumed to represent a
partially sacralized L5 level. Careful correlation with this
numbering strategy prior to any procedural intervention would be
recommended.
4. Lumbar spondylosis with severe spinal canal stenosis at L4-L5 and
mild bilateral neural foraminal narrowing at L2-L3 and L3-L4.

## 2022-01-12 ENCOUNTER — Telehealth: Payer: Self-pay

## 2022-01-12 NOTE — Progress Notes (Unsigned)
Chronic Care Management Pharmacy Assistant   Name: Randy Kennedy  MRN: [id number] DOB: 20-Jul-1951  Reason for Encounter: CCM (Medication Adherence and Delivery Coordination)   Recent office visits:  12/16/21 [name], MD Pain Management Stop (Patient): Furosemide 20 mg Stop (Patient): Ibuprofen 600 mg FU 3 months  Recent consult visits:  12/29/21 Ida Rogue, MD (Cardiology): HTN No med changes FU 12 months  Hospital visits:  None in previous 6 months  Medications: Outpatient Encounter Medications as of 01/12/2022  Medication Sig   aspirin EC 81 MG tablet Take 81 mg by mouth daily.   colchicine 0.6 MG tablet TAKE ONE TABLET BY MOUTH ONCE DAILY   doxazosin (CARDURA) 2 MG tablet Take 1 tablet (2 mg total) by mouth 2 (two) times daily.   fexofenadine (ALLEGRA) 180 MG tablet Take 180 mg by mouth daily.   furosemide (LASIX) 20 MG tablet Take 1 tablet (20 mg total) by mouth 3 (three) times a week.   gabapentin (NEURONTIN) 300 MG capsule Take 1 capsule (300 mg total) by mouth 2 (two) times daily AND 2 capsules (600 mg total) at bedtime.   hydrALAZINE (APRESOLINE) 100 MG tablet TAKE 1 AND 1/2 TABLETS BY MOUTH THREE TIMES DAILY, MAY increase TO FOUR TIMES DAILY AS NEEDED FOR HIGH BLOOD PRESSURE   HYDROcodone-acetaminophen (NORCO) 10-325 MG tablet TAKE ONE TABLET BY MOUTH every SIX hours AS NEEDED. Please contact [facility] for an appt, Call [phone].   meloxicam (MOBIC) 15 MG tablet TAKE 1 TABLET BY MOUTH ONCE DAILY AS NEEDED FOR  GOUT  FLARE   nitroGLYCERIN (NITROSTAT) 0.4 MG SL tablet Place 1 tablet (0.4 mg total) under the tongue every 5 (five) minutes as needed for chest pain.   pravastatin (PRAVACHOL) 40 MG tablet TAKE ONE TABLET BY MOUTH ONCE DAILY   predniSONE (DELTASONE) 20 MG tablet Take 1 tablet (20 mg total) by mouth daily. For 1-2 days prn gout flare   probenecid (BENEMID) 500 MG tablet TAKE 1/2 TABLET BY MOUTH TWICE DAILY   valsartan (DIOVAN) 320  MG tablet TAKE ONE TABLET BY MOUTH ONCE DAILY   vitamin C (ASCORBIC ACID) 500 MG tablet Take 500 mg by mouth daily.   No facility-administered encounter medications on file as of 01/12/2022.   BP Readings from Last 3 Encounters:  12/29/21 (!) 156/68  12/16/21 (!) 156/78  06/24/21 (!) 180/80    Lab Results  Component Value Date   HGBA1C 5.6 10/24/2020    Recent OV, Consult or Hospital visit:  Recent medication changes indicated:   Last adherence delivery date: 10/24/2021      Patient is due for next adherence delivery on: 01/22/2022  {Med Review:25223}  This delivery to include: Vials  90 Days  Furosemide 20 mg - 1 tab three days a week Doxazosin 2 mg 2x daily; if >140 take extra Doxazosin Hydralazine 100 mg- Take 1.5 tablets TID may increase to QID if needed for blood pressure Probenecid 500 mg  -  tablet BID Pravastatin 40 mg  - 1 tablet qpm Gabapentin '300mg'$  take 1 tablet at breakfast 1 tablet evening meal, 2 tablets at bedtime Valsartan 320 mg take  1 tablet at breakfast Colchicine 0.'6mg'$  take 1 tablet at breakfast  Hydrocodone 10/'325mg'$  take 1 tablet every 6 hours prn pain    Any concerns about your medications? No   How often do you forget or accidentally miss a dose? Never   Do you use a pillbox? Yes   Is patient in  packaging No   Refills requested from providers include: Valsartan 320 mg take  1 tablet at breakfast Colchicine 0.'6mg'$  take 1 tablet at breakfast  Hydrocodone 10/'325mg'$  take 1 tablet every 6 hours prn pain  Gabapentin '300mg'$  take 1 tablet at breakfast 1 tablet evening meal, 2 tablets at bedtime  {Delivery date:25786} 06/29  Recent blood pressure readings are as follows:***    Annual wellness visit in last year? Yes 10/02/2021 Most Recent BP reading: 156/68 on 12/29/2021   Charlene Brooke, CPP notified   Marijean Niemann, [place] Pharmacy Assistant [phone]

## 2022-01-12 NOTE — Telephone Encounter (Signed)
Name of Medication: Hydrocodone-APAP Name of Pharmacy: [facility] Last Fill or Written Date and Quantity: 12/12/21, #90 Last Office Visit and Type: 12/16/21, chronic pain f/u Next Office Visit and Type: 03/18/22, CPE Last Controlled Substance Agreement Date: 10/09/16 Last UDS: 03/10/19  Colchicine last filled:  10/20/21, #90 Gabapentin last filled:  10/20/21, #360

## 2022-01-14 NOTE — Telephone Encounter (Signed)
ERx 

## 2022-01-15 ENCOUNTER — Other Ambulatory Visit: Payer: Self-pay | Admitting: Family Medicine

## 2022-01-29 ENCOUNTER — Encounter: Payer: Self-pay | Admitting: Cardiovascular Disease

## 2022-02-02 MED ORDER — CLONIDINE HCL 0.1 MG PO TABS: 0.1000 mg | ORAL_TABLET | Freq: Two times a day (BID) | ORAL | 11 refills | Status: DC

## 2022-02-10 ENCOUNTER — Other Ambulatory Visit: Payer: Self-pay | Admitting: Family Medicine

## 2022-02-10 NOTE — Telephone Encounter (Signed)
Name of Medication: Hydrocodone-APAP Name of Pharmacy: [facility] Last Fill or Written Date and Quantity: 01/15/22, #90 Last Office Visit and Type: 12/16/21, chronic pain f/u Next Office Visit and Type: 03/18/22, CPE Last Controlled Substance Agreement Date: 10/09/16 Last UDS: 03/10/19

## 2022-02-11 NOTE — Telephone Encounter (Signed)
ERx 

## 2022-02-25 ENCOUNTER — Telehealth: Payer: Self-pay | Admitting: Cardiovascular Disease

## 2022-02-25 ENCOUNTER — Inpatient Hospital Stay (HOSPITAL_COMMUNITY)
Admission: EM | Admit: 2022-02-25 | Discharge: 2022-02-28 | DRG: 244 | Disposition: A | Discharge status: Home / Self Care | Payer: PPO | Attending: Internal Medicine | Admitting: Internal Medicine

## 2022-02-25 ENCOUNTER — Other Ambulatory Visit: Payer: Self-pay

## 2022-02-25 ENCOUNTER — Encounter (HOSPITAL_COMMUNITY): Payer: Self-pay

## 2022-02-25 ENCOUNTER — Emergency Department (HOSPITAL_COMMUNITY): Payer: PPO

## 2022-02-25 DIAGNOSIS — Z8616 Personal history of COVID-19: Secondary | ICD-10-CM | POA: Diagnosis not present

## 2022-02-25 DIAGNOSIS — R001 Bradycardia, unspecified: Secondary | ICD-10-CM | POA: Diagnosis not present

## 2022-02-25 DIAGNOSIS — I4510 Unspecified right bundle-branch block: Secondary | ICD-10-CM | POA: Diagnosis not present

## 2022-02-25 DIAGNOSIS — M109 Gout, unspecified: Secondary | ICD-10-CM | POA: Diagnosis not present

## 2022-02-25 DIAGNOSIS — Z7982 Long term (current) use of aspirin: Secondary | ICD-10-CM

## 2022-02-25 DIAGNOSIS — Z95810 Presence of automatic (implantable) cardiac defibrillator: Secondary | ICD-10-CM | POA: Diagnosis not present

## 2022-02-25 DIAGNOSIS — Z79899 Other long term (current) drug therapy: Secondary | ICD-10-CM | POA: Diagnosis not present

## 2022-02-25 DIAGNOSIS — I10 Essential (primary) hypertension: Secondary | ICD-10-CM | POA: Diagnosis present

## 2022-02-25 DIAGNOSIS — M25572 Pain in left ankle and joints of left foot: Secondary | ICD-10-CM | POA: Diagnosis present

## 2022-02-25 DIAGNOSIS — G2581 Restless legs syndrome: Secondary | ICD-10-CM | POA: Diagnosis not present

## 2022-02-25 DIAGNOSIS — J309 Allergic rhinitis, unspecified: Secondary | ICD-10-CM | POA: Diagnosis not present

## 2022-02-25 DIAGNOSIS — N529 Male erectile dysfunction, unspecified: Secondary | ICD-10-CM | POA: Diagnosis present

## 2022-02-25 DIAGNOSIS — I495 Sick sinus syndrome: Secondary | ICD-10-CM | POA: Diagnosis not present

## 2022-02-25 DIAGNOSIS — Z7952 Long term (current) use of systemic steroids: Secondary | ICD-10-CM | POA: Diagnosis not present

## 2022-02-25 DIAGNOSIS — R9431 Abnormal electrocardiogram [ECG] [EKG]: Secondary | ICD-10-CM | POA: Diagnosis not present

## 2022-02-25 DIAGNOSIS — Z91040 Latex allergy status: Secondary | ICD-10-CM

## 2022-02-25 DIAGNOSIS — H9190 Unspecified hearing loss, unspecified ear: Secondary | ICD-10-CM | POA: Diagnosis present

## 2022-02-25 DIAGNOSIS — Z87891 Personal history of nicotine dependence: Secondary | ICD-10-CM

## 2022-02-25 DIAGNOSIS — R079 Chest pain, unspecified: Secondary | ICD-10-CM | POA: Diagnosis not present

## 2022-02-25 DIAGNOSIS — G8929 Other chronic pain: Secondary | ICD-10-CM | POA: Diagnosis not present

## 2022-02-25 DIAGNOSIS — G47419 Narcolepsy without cataplexy: Secondary | ICD-10-CM | POA: Diagnosis not present

## 2022-02-25 DIAGNOSIS — Z961 Presence of intraocular lens: Secondary | ICD-10-CM | POA: Diagnosis present

## 2022-02-25 DIAGNOSIS — Z974 Presence of external hearing-aid: Secondary | ICD-10-CM

## 2022-02-25 DIAGNOSIS — Z888 Allergy status to other drugs, medicaments and biological substances status: Secondary | ICD-10-CM

## 2022-02-25 DIAGNOSIS — M1990 Unspecified osteoarthritis, unspecified site: Secondary | ICD-10-CM | POA: Diagnosis not present

## 2022-02-25 DIAGNOSIS — R5382 Chronic fatigue, unspecified: Secondary | ICD-10-CM | POA: Diagnosis not present

## 2022-02-25 DIAGNOSIS — G4733 Obstructive sleep apnea (adult) (pediatric): Secondary | ICD-10-CM | POA: Diagnosis not present

## 2022-02-25 DIAGNOSIS — E669 Obesity, unspecified: Secondary | ICD-10-CM | POA: Diagnosis not present

## 2022-02-25 DIAGNOSIS — E785 Hyperlipidemia, unspecified: Secondary | ICD-10-CM | POA: Diagnosis not present

## 2022-02-25 DIAGNOSIS — Z91199 Patient's noncompliance with other medical treatment and regimen due to unspecified reason: Secondary | ICD-10-CM

## 2022-02-25 DIAGNOSIS — Z6833 Body mass index (BMI) 33.0-33.9, adult: Secondary | ICD-10-CM

## 2022-02-25 DIAGNOSIS — I517 Cardiomegaly: Secondary | ICD-10-CM | POA: Diagnosis not present

## 2022-02-25 LAB — BASIC METABOLIC PANEL
Anion gap: 7 (ref 5–15)
BUN: 18 mg/dL (ref 8–23)
CO2: 26 mmol/L (ref 22–32)
Calcium: 9.1 mg/dL (ref 8.9–10.3)
Chloride: 106 mmol/L (ref 98–111)
Creatinine, Ser: 1.23 mg/dL (ref 0.61–1.24)
GFR, Estimated: >60 mL/min (ref >60)
Glucose, Bld: 104 mg/dL — ABNORMAL HIGH (ref 70–99)
Potassium: 3.9 mmol/L (ref 3.5–5.1)
Sodium: 139 mmol/L (ref 135–145)

## 2022-02-25 LAB — CBC
HCT: 42.2 % (ref 39.0–52.0)
HCT: 43.6 % (ref 39.0–52.0)
Hemoglobin: 14.9 g/dL (ref 13.0–17.0)
Hemoglobin: 15.3 g/dL (ref 13.0–17.0)
MCH: 31.6 pg (ref 26.0–34.0)
MCH: 31.5 pg (ref 26.0–34.0)
MCHC: 35.3 g/dL (ref 30.0–36.0)
MCHC: 35.1 g/dL (ref 30.0–36.0)
MCV: 89.6 fL (ref 80.0–100.0)
MCV: 89.7 fL (ref 80.0–100.0)
Platelets: 141 10*3/uL — ABNORMAL LOW (ref 150–400)
Platelets: 139 10*3/uL — ABNORMAL LOW (ref 150–400)
RBC: 4.71 MIL/uL (ref 4.22–5.81)
RBC: 4.86 MIL/uL (ref 4.22–5.81)
RDW: 12.1 % (ref 11.5–15.5)
RDW: 12.4 % (ref 11.5–15.5)
WBC: 7.1 10*3/uL (ref 4.0–10.5)
WBC: 6.7 10*3/uL (ref 4.0–10.5)
nRBC: 0 % (ref 0.0–0.2)
nRBC: 0 % (ref 0.0–0.2)

## 2022-02-25 LAB — TROPONIN I (HIGH SENSITIVITY)
Troponin I (High Sensitivity): 11 ng/L (ref <18)
Troponin I (High Sensitivity): 12 ng/L (ref <18)

## 2022-02-25 LAB — CREATININE, SERUM
Creatinine, Ser: 1.2 mg/dL (ref 0.61–1.24)
GFR, Estimated: >60 mL/min (ref >60)

## 2022-02-25 MED ORDER — GABAPENTIN 300 MG PO CAPS
300.0000 mg | ORAL_CAPSULE | Freq: Two times a day (BID) | ORAL | Status: DC
  Administered 2022-02-25 – 2022-02-28 (×6): 300 mg via ORAL
  Filled 2022-02-25 (×6): qty 1

## 2022-02-25 MED ORDER — HYDRALAZINE HCL 50 MG PO TABS
100.0000 mg | ORAL_TABLET | Freq: Three times a day (TID) | ORAL | Status: DC
  Administered 2022-02-25 – 2022-02-28 (×8): 100 mg via ORAL
  Filled 2022-02-25 (×8): qty 2

## 2022-02-25 MED ORDER — FUROSEMIDE 20 MG PO TABS
20.0000 mg | ORAL_TABLET | ORAL | Status: DC
  Administered 2022-02-27: 20 mg via ORAL
  Filled 2022-02-25: qty 1

## 2022-02-25 MED ORDER — ATROPINE SULFATE 1 MG/10ML IJ SOSY
PREFILLED_SYRINGE | INTRAMUSCULAR | Status: AC
  Filled 2022-02-25: qty 10

## 2022-02-25 MED ORDER — HEPARIN SODIUM (PORCINE) 5000 UNIT/ML IJ SOLN
5000.0000 [IU] | Freq: Three times a day (TID) | INTRAMUSCULAR | Status: DC
  Administered 2022-02-25 – 2022-02-27 (×4): 5000 [IU] via SUBCUTANEOUS
  Filled 2022-02-25 (×4): qty 1

## 2022-02-25 MED ORDER — HYDROCODONE-ACETAMINOPHEN 10-325 MG PO TABS
1.0000 | ORAL_TABLET | Freq: Once | ORAL | Status: AC
  Administered 2022-02-25: 1 via ORAL
  Filled 2022-02-25: qty 1

## 2022-02-25 MED ORDER — CLONIDINE HCL 0.1 MG PO TABS
0.1000 mg | ORAL_TABLET | Freq: Two times a day (BID) | ORAL | Status: DC
  Administered 2022-02-25: 0.1 mg via ORAL
  Filled 2022-02-25 (×2): qty 1

## 2022-02-25 MED ORDER — IRBESARTAN 150 MG PO TABS
300.0000 mg | ORAL_TABLET | Freq: Every day | ORAL | Status: DC
  Administered 2022-02-26 – 2022-02-28 (×3): 300 mg via ORAL
  Filled 2022-02-25 (×3): qty 2

## 2022-02-25 MED ORDER — ASPIRIN 81 MG PO TBEC
81.0000 mg | DELAYED_RELEASE_TABLET | Freq: Every day | ORAL | Status: DC
  Administered 2022-02-25 – 2022-02-28 (×4): 81 mg via ORAL
  Filled 2022-02-25 (×4): qty 1

## 2022-02-25 MED ORDER — PRAVASTATIN SODIUM 40 MG PO TABS
40.0000 mg | ORAL_TABLET | Freq: Every day | ORAL | Status: DC
  Administered 2022-02-26 – 2022-02-28 (×3): 40 mg via ORAL
  Filled 2022-02-25 (×3): qty 1

## 2022-02-25 NOTE — Telephone Encounter (Signed)
Spoke with patients wife and she stated that the patient has been feeling extremely fatigued, SOB, and looks very pale. She took patients pulse using a Pulse oximeter. His pulse was running in the 30's all morning. She also stated that he is having a hard time moving his legs.  I advised that she get him to the ED as soon as possible. They live in [place]. I recommended that they go to [facility] where there is an EP available. She stated that she will be driving him to [facility] now.

## 2022-02-25 NOTE — ED Notes (Signed)
Pt alert, NAD, calm, eating dinner brought by family at [facility], pending bed ready

## 2022-02-25 NOTE — ED Provider Notes (Signed)
[facility] EMERGENCY DEPARTMENT Provider Note   CSN: [id number] Arrival date & time: 02/25/22  1220     History  Chief Complaint  Patient presents with   Bradycardia    Randy Kennedy is a 71 y.o. male.  Presenting to ER due to concern for generalized weakness, fatigue.  Patient reports for the past couple months he has been dealing with feeling generally weak and fatigued, more sleepy and lethargic.  Sleeps during the day at times.  A couple weeks ago had an episode of near syncope while exerting himself.  No full syncope episodes, no syncope episodes today.  His daughter is Therapist, sports and advised checking heart rate this morning.  Wife reports that heart rate was checked and it was 33 soon after waking up.  Patient was at rest.  Patient reports that few weeks ago he was started on clonidine for blood pressure control.  Has dealt with high blood pressure and has been on multiple antihypertensives previously.  He denies any pain right now except for a mild headache.  Has dealt with headaches previously, this is not his worst headache of his life, not sudden onset.  No chest pain or difficulty in breathing.  Reviewed chart, reviewed telephone notes from cardiology office, reviewed Dr. Donivan Scull last cardiology visit on 6/05, heart rate was 50.  HPI     Home Medications Prior to Admission medications   Medication Sig Start Date End Date Taking? Authorizing Provider  aspirin EC 81 MG tablet Take 81 mg by mouth daily.    [provider]  cloNIDine (CATAPRES) 0.1 MG tablet Take 1 tablet (0.1 mg total) by mouth 2 (two) times daily. 02/02/22   Minna Merritts, MD  colchicine 0.6 MG tablet TAKE ONE TABLET BY MOUTH ONCE DAILY 01/14/22   [name], MD  fexofenadine (ALLEGRA) 180 MG tablet Take 180 mg by mouth daily.    [provider]  furosemide (LASIX) 20 MG tablet Take 1 tablet (20 mg total) by mouth 3 (three) times a week. 12/29/21   Minna Merritts, MD   gabapentin (NEURONTIN) 300 MG capsule TAKE ONE CAPSULE BY MOUTH TWICE DAILY and TAKE TWO CAPSULES EVERYDAY AT BEDTIME 01/14/22   [name], MD  hydrALAZINE (APRESOLINE) 100 MG tablet TAKE 1 AND 1/2 TABLETS BY MOUTH THREE TIMES DAILY, MAY increase TO FOUR TIMES DAILY AS NEEDED FOR HIGH BLOOD PRESSURE 01/22/21   [name], MD  HYDROcodone-acetaminophen [facility]) 10-325 MG tablet TAKE ONE TABLET BY MOUTH every SIX hours AS NEEDED 02/11/22   [name], MD  meloxicam (MOBIC) 15 MG tablet TAKE 1 TABLET BY MOUTH ONCE DAILY AS NEEDED FOR  GOUT  FLARE 04/05/19   [name], MD  nitroGLYCERIN (NITROSTAT) 0.4 MG SL tablet Place 1 tablet (0.4 mg total) under the tongue every 5 (five) minutes as needed for chest pain. 02/16/13   Minna Merritts, MD  pravastatin (PRAVACHOL) 40 MG tablet TAKE ONE TABLET BY MOUTH ONCE DAILY 01/16/22   [name], MD  predniSONE (DELTASONE) 20 MG tablet Take 1 tablet (20 mg total) by mouth daily. For 1-2 days prn gout flare 04/06/19   [name], MD  probenecid (BENEMID) 500 MG tablet TAKE 1/2 TABLET BY MOUTH TWICE DAILY 01/19/22   [name], MD  valsartan (DIOVAN) 320 MG tablet TAKE ONE TABLET BY MOUTH ONCE DAILY 01/12/22   [name], MD  vitamin C (ASCORBIC ACID) 500 MG tablet Take 500 mg by mouth daily.  [provider]      Allergies    Amlodipine, Allopurinol, Lisinopril-hydrochlorothiazide, Procardia [nifedipine], Spironolactone, Clonidine derivatives, Doxazosin mesylate, Latex, Nortriptyline, and Uloric [febuxostat]    Review of Systems   Review of Systems  Constitutional:  Positive for fatigue.  Neurological:  Positive for weakness and light-headedness.  All other systems reviewed and are negative.   Physical Exam Updated Vital Signs BP (!) 180/72   Pulse 70   Temp 98.4 F (36.9 C) (Oral)   Resp 19   Ht '5\' 7"'$  (1.702 m)   Wt 97.5 kg   SpO2 94%   BMI 33.67 kg/m  Physical Exam Vitals and nursing  note reviewed.  Constitutional:      General: He is not in acute distress.    Appearance: He is well-developed.  HENT:     Head: Normocephalic and atraumatic.  Eyes:     Conjunctiva/sclera: Conjunctivae normal.  Cardiovascular:     Rate and Rhythm: Regular rhythm. Bradycardia present.     Pulses: Normal pulses.     Heart sounds: No murmur heard. Pulmonary:     Effort: Pulmonary effort is normal. No respiratory distress.     Breath sounds: Normal breath sounds.  Abdominal:     Palpations: Abdomen is soft.     Tenderness: There is no abdominal tenderness.  Musculoskeletal:        General: No swelling.     Cervical back: Neck supple.  Skin:    General: Skin is warm and dry.     Capillary Refill: Capillary refill takes less than 2 seconds.  Neurological:     Mental Status: He is alert.  Psychiatric:        Mood and Affect: Mood normal.     ED Results / Procedures / Treatments   Labs (all labs ordered are listed, but only abnormal results are displayed) Labs Reviewed  BASIC METABOLIC PANEL - Abnormal; Notable for the following components:      Result Value   Glucose, Bld 104 (*)    All other components within normal limits  CBC - Abnormal; Notable for the following components:   Platelets 141 (*)    All other components within normal limits  TROPONIN I (HIGH SENSITIVITY)  TROPONIN I (HIGH SENSITIVITY)    EKG EKG Interpretation  Date/Time:  Wednesday February 25 2022 17:25:46 EDT Ventricular Rate:  36 PR Interval:  170 QRS Duration: 175 QT Interval:  535 QTC Calculation: 414 R Axis:   -26 Text Interpretation: Sinus bradycardia Atrial premature complex Right bundle branch block LVH by voltage Confirmed by Madalyn Rob [phone]) on 02/25/2022 5:39:25 PM  Radiology DG Chest 2 View  Result Date: 02/25/2022 CLINICAL DATA:  Chest pain. EXAM: CHEST - 2 VIEW COMPARISON:  August 26, 2020 FINDINGS: Tortuosity of the aorta. Cardiomediastinal silhouette is normal. Mediastinal  contours appear intact. There is no evidence of focal airspace consolidation, pleural effusion or pneumothorax. Osseous structures are without acute abnormality. Soft tissues are grossly normal. IMPRESSION: No active cardiopulmonary disease. Electronically Signed   By: Fidela Salisbury M.D.   On: 02/25/2022 13:10    Procedures Procedures    Medications Ordered in ED Medications  atropine 1 MG/10ML injection (0 mg  Hold 02/25/22 1814)    ED Course/ Medical Decision Making/ A&P                           Medical Decision Making Amount and/or Complexity of Data Reviewed Labs: ordered.  Radiology: ordered.  Risk Decision regarding hospitalization.   71 year old male presenting for low heart rate.  Also endorses general fatigue, general weakness for couple months.  His heart rate is slow ranging from mid 30s to low 40s.  Blood pressure is stable and he is alert.  His EKG demonstrates sinus rhythm.  Per review of cardiac monitor, remains in sinus rhythm.  I discussed with Thukkani with cards. He will come eval, likely admit for formal EP eval and further workup and observation. Updated pt, wife and daughter.         Final Clinical Impression(s) / ED Diagnoses Final diagnoses:  Symptomatic bradycardia    Rx / DC Orders ED Discharge Orders     None         Lucrezia Starch, MD 02/25/22 [id number]

## 2022-02-25 NOTE — H&P (Addendum)
Cardiology Admission History and Physical:   Patient ID: Randy Kennedy MRN: [id number]; DOB: 08-14-1950   Admission date: 02/25/2022  PCP:  [name], MD   [facility] Providers Cardiologist:  None        Chief Complaint: Fatigue and bradycardia  Patient Profile:   Randy Kennedy is a 71 y.o. male with right bundle branch block, hypertension, hyperlipidemia, obstructive sleep apnea not on CPAP or BiPAP and morbid obesity who is being seen 02/25/2022 for the evaluation of fatigue and bradycardia.  History of Present Illness:   [name] is a 71 year old male with the above listed medical problems.  He reports increasing fatigue over the last week or so.  Last week he also had a presyncopal episode.  Interestingly this lasted for 15 minutes.  It resolved on its own.  He has noticed increasing fatigue with activities of daily living.  He denies any exertional chest pain but has some atypical chest pain at rest.  He denies any peripheral edema.  He sleeps in a recliner for the most part not for orthopnea per se but due to postnasal drip with nasal congestion.  The patient is usually followed by Dr. Rockey Situ.  He was seen in 2018 for management of high blood pressure.  He was noted to be bradycardic at that time.  Peripheral echocardiogram at that time which shows preserved LV function and elevated pulmonary artery pressures thought to be due to unknown treated sleep apnea.  He had no significant valvular abnormalities.  He was noted to have fatigue for several years which was thought to be multifactorial with deconditioning and apnea likely contributing.  Seen most recently in early June.  At that time his pulse was 50 and his blood pressure was 156/68.  Given his numerous medical intolerances his ongoing therapy was continued with no changes.  In the emergency department here his evaluation included an EKG demonstrating sinus bradycardia with a right bundle branch block.  His  heart rates in the emergency department have been in the 30s.  He is on no rate control agents.  Patient labs are relatively unremarkable including a negative troponin.   Past Medical History:  Diagnosis Date   Allergic rhinitis    Arthritis    Chest pain    a. 11/2011 reportedly normal stress test performed in [place].   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 moderate, AHI 37.4 increased to 80 in REM, on BiPAP 14/10, 97% compliance >4 hrs (06/2013); b. Does not tolerate CPAP.   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 Treated with vicodin, prior tried fentanyl and butrans.   Systolic murmur    Wears dentures    Partial lower   Wears hearing aid in both ears    Does not wear all the time    Past Surgical History:  Procedure Laterality Date   CATARACT EXTRACTION W/PHACO Right 11/26/2020   Procedure: CATARACT EXTRACTION PHACO AND INTRAOCULAR LENS PLACEMENT ([facility]) RIGHT VIVITY TORIC LENS 7.17 00:49.6;  Surgeon: Birder Robson, MD;  Location: [facility];  Service: Ophthalmology;  Laterality: Right;   Latex Sleep Apnea   CATARACT EXTRACTION W/PHACO Left 12/10/2020   Procedure: CATARACT EXTRACTION PHACO AND INTRAOCULAR LENS PLACEMENT ([place]) LEFT VIVITY;  Surgeon: Birder Robson, MD;  Location: [facility];  Service: Ophthalmology;  Laterality: Left;  Latex Sleep apnea 5.37 00:32.7   COLONOSCOPY  02/2011   ext hem, benign polyp, rpt 5 yrs ([place])   COLONOSCOPY WITH PROPOFOL N/A 12/17/2020   multiple TA, ext hem, rpt 3 yrs ([place], Tally Due, MD)   LEG SURGERY  x5   left - after fall at work (3.5 stories)   [place]   jaw fracture - horse kick   MOHS SURGERY Right 07/03/2021   Ear   TYMPANIC MEMBRANE REPAIR Left 1994   otosclerosis     Medications Prior to Admission: Prior to Admission medications   Medication Sig Start Date End Date Taking? Authorizing Provider  aspirin EC 81 MG tablet Take 81 mg by mouth daily.    [provider]  cloNIDine (CATAPRES) 0.1 MG tablet Take 1 tablet (0.1 mg total) by mouth 2 (two) times daily. 02/02/22   Minna Merritts, MD  colchicine 0.6 MG tablet TAKE ONE TABLET BY MOUTH ONCE DAILY 01/14/22   [name], MD  fexofenadine (ALLEGRA) 180 MG tablet Take 180 mg by mouth daily.    [provider]  furosemide (LASIX) 20 MG tablet Take 1 tablet (20 mg total) by mouth 3 (three) times a week. 12/29/21   Minna Merritts, MD  gabapentin (NEURONTIN) 300 MG capsule TAKE ONE CAPSULE BY MOUTH TWICE DAILY and TAKE TWO CAPSULES EVERYDAY AT BEDTIME 01/14/22   [name], MD  hydrALAZINE (APRESOLINE) 100 MG tablet TAKE 1 AND 1/2 TABLETS BY MOUTH THREE TIMES DAILY, MAY increase TO FOUR TIMES DAILY AS NEEDED FOR HIGH BLOOD PRESSURE 01/22/21   [name], MD  HYDROcodone-acetaminophen [facility]) 10-325 MG tablet TAKE ONE TABLET BY MOUTH every SIX hours AS NEEDED 02/11/22   [name], MD  meloxicam (MOBIC) 15 MG tablet TAKE 1 TABLET BY MOUTH ONCE DAILY AS NEEDED FOR  GOUT  FLARE 04/05/19   [name], MD   nitroGLYCERIN (NITROSTAT) 0.4 MG SL tablet Place 1 tablet (0.4 mg total) under the tongue every 5 (five) minutes as needed for chest pain. 02/16/13   Minna Merritts, MD  pravastatin (PRAVACHOL) 40 MG tablet TAKE ONE TABLET BY MOUTH ONCE DAILY 01/16/22   [name], MD  predniSONE (DELTASONE) 20 MG tablet Take 1 tablet (20 mg total) by mouth daily. For 1-2 days prn gout flare 04/06/19   [name], MD  probenecid (BENEMID) 500 MG tablet TAKE 1/2 TABLET BY MOUTH TWICE DAILY 01/19/22   [name], MD  valsartan (DIOVAN) 320 MG tablet TAKE ONE TABLET BY MOUTH ONCE DAILY 01/12/22   [name], MD  vitamin C (  ASCORBIC ACID) 500 MG tablet Take 500 mg by mouth daily.    [provider]     Allergies:    Allergies  Allergen Reactions   Amlodipine Rash    Rash in groin   Allopurinol Other (See Comments)    GI upset   Lisinopril-Hydrochlorothiazide     Burning in feet.   Procardia [Nifedipine] Other (See Comments)    Bradycardia, leg swelling   Spironolactone Other (See Comments)    malaise, dizziness   Clonidine Derivatives Rash   Doxazosin Mesylate Rash   Latex Rash   Nortriptyline Rash   Uloric [Febuxostat] Rash    Social History:   Social History   Socioeconomic History   Marital status: Married    Spouse name: Not on file   Number of children: Not on file   Years of education: Not on file   Highest education level: Not on file  Occupational History   Not on file  Tobacco Use   Smoking status: Former    Packs/day: 1.00    Years: 4.00    Total pack years: 4.00    Types: Pipe, Cigarettes    Quit date: 07/27/1978    Years since quitting: 43.6   Smokeless tobacco: Never  Vaping Use   Vaping Use: Never used  Substance and Sexual Activity   Alcohol use: No    Alcohol/week: 0.0 standard drinks of alcohol   Drug use: No   Sexual activity: Not on file  Other Topics Concern   Not on file  Social History Narrative   Caffeine: 2 cans coke/day    Lives with wife and grown son    Occupation: retired, was Agricultural engineer.   On disability for chronic pain   Edu: MBA   Activity: no regular exercise   Deit: good water, fruits/vegetables daily   Social Determinants of Health   Financial Resource Strain: Low Risk  (10/02/2021)   Overall Financial Resource Strain (CARDIA)    Difficulty of Paying Living Expenses: Not hard at all  Food Insecurity: No Food Insecurity (10/02/2021)   Hunger Vital Sign    Worried About Running Out of Food in the Last Year: Never true    Ran Out of Food in the Last Year: Never true  Transportation Needs: No Transportation Needs (10/02/2021)   PRAPARE - Hydrologist (Medical): No    Lack of Transportation (Non-Medical): No  Physical Activity: Unknown (10/02/2021)   Exercise Vital Sign    Days of Exercise per Week: 7 days    Minutes of Exercise per Session: Not on file  Stress: No Stress Concern Present (10/02/2021)   [place]    Feeling of Stress : Not at all  Social Connections: Moderately Isolated (10/02/2021)   Social Connection and Isolation Panel [NHANES]    Frequency of Communication with Friends and Family: More than three times a week    Frequency of Social Gatherings with Friends and Family: More than three times a week    Attends Religious Services: Never    Marine scientist or Organizations: No    Attends Archivist Meetings: Never    Marital Status: Married  Human resources officer Violence: Not At Risk (10/02/2021)   Humiliation, Afraid, Rape, and Kick questionnaire    Fear of Current or Ex-Partner: No    Emotionally Abused: No    Physically Abused: No    Sexually Abused: No  Family History:   The patient's family history includes Cancer in his mother and paternal uncle; Cancer (age of onset: 44) in his father; Hypertension in his mother. There is no history of Diabetes, Stroke, or  CAD.    ROS:  Please see the history of present illness.  All other ROS reviewed and negative.     Physical Exam/Data:   Vitals:   02/25/22 1227 02/25/22 1230 02/25/22 1607 02/25/22 1718  BP: (!) 171/84  (!) 149/79 (!) 180/72  Pulse: (!) 40  (!) 40 70  Resp: '18  16 19  '$ Temp: 98.4 F (36.9 C)     TempSrc: Oral     SpO2: 95%  95% 94%  Weight:  97.5 kg    Height:  '5\' 7"'$  (1.702 m)     No intake or output data in the 24 hours ending 02/25/22 1815    02/25/2022   12:30 PM 12/29/2021    8:41 AM 12/16/2021    8:50 AM  Last 3 Weights  Weight (lbs) 215 lb 232 lb 4 oz 228 lb 2 oz  Weight (kg) 97.523 kg 105.348 kg 103.477 kg     Body mass index is 33.67 kg/m.  General:  Well nourished, well developed, in no acute distress HEENT: normal Neck: no JVD Vascular: No carotid bruits; Distal pulses 2+ bilaterally   Cardiac:  normal S1, S2; bradycardic without murmurs Lungs:  clear to auscultation bilaterally, no wheezing, rhonchi or rales  Abd: soft, nontender, no hepatomegaly  Ext: no edema Musculoskeletal:  No deformities, BUE and BLE strength normal and equal Skin: warm and dry  Neuro:  CNs 2-12 intact, no focal abnormalities noted Psych:  Normal affect    EKG:  The ECG that was done today was personally reviewed and demonstrates sinus bradycardia with right bundle branch block  Relevant CV Studies: Echocardiogram November 2018 Left ventricle: The cavity size was normal. There was moderate   concentric hypertrophy. Systolic function was normal. The   estimated ejection fraction was in the range of 55% to 60%. Wall   motion was normal; there were no regional wall motion   abnormalities. Features are consistent with a pseudonormal left   ventricular filling pattern, with concomitant abnormal relaxation   and increased filling pressure (grade 2 diastolic dysfunction). - Aortic valve: There was trivial regurgitation. - Mitral valve: There was mild regurgitation. - Left atrium: The  atrium was mildly dilated. - Right atrium: The atrium was moderately dilated. - Pulmonary arteries: Systolic pressure was moderately increased.   PA peak pressure: 48 mm Hg (S).  Laboratory Data:  High Sensitivity Troponin:   Recent Labs  Lab 02/25/22 1245  TROPONINIHS 11      Chemistry Recent Labs  Lab 02/25/22 1245  NA 139  K 3.9  CL 106  CO2 26  GLUCOSE 104*  BUN 18  CREATININE 1.23  CALCIUM 9.1  GFRNONAA >60  ANIONGAP 7    No results for input(s): "PROT", "ALBUMIN", "AST", "ALT", "ALKPHOS", "BILITOT" in the last 168 hours. Lipids No results for input(s): "CHOL", "TRIG", "HDL", "LABVLDL", "LDLCALC", "CHOLHDL" in the last 168 hours. Hematology Recent Labs  Lab 02/25/22 1245  WBC 7.1  RBC 4.71  HGB 14.9  HCT 42.2  MCV 89.6  MCH 31.6  MCHC 35.3  RDW 12.1  PLT 141*   Thyroid No results for input(s): "TSH", "FREET4" in the last 168 hours. BNPNo results for input(s): "BNP", "PROBNP" in the last 168 hours.  DDimer No results for input(s): "  DDIMER" in the last 168 hours.   Radiology/Studies:  DG Chest 2 View  Result Date: 02/25/2022 CLINICAL DATA:  Chest pain. EXAM: CHEST - 2 VIEW COMPARISON:  August 26, 2020 FINDINGS: Tortuosity of the aorta. Cardiomediastinal silhouette is normal. Mediastinal contours appear intact. There is no evidence of focal airspace consolidation, pleural effusion or pneumothorax. Osseous structures are without acute abnormality. Soft tissues are grossly normal. IMPRESSION: No active cardiopulmonary disease. Electronically Signed   By: Fidela Salisbury M.D.   On: 02/25/2022 13:10     Assessment and Plan:   Fatigue: The patient has developed increasing fatigue over his baseline ongoing chronic fatigue.  Presence of a right bundle branch block on previous EKGs suggest underlying conduction system disease.  He is now demonstrating sinus bradycardia.  Given these findings taken together this may indicate the need for pacemaker placement due  to symptomatic bradycardia.  We will admit the patient, obtain an echocardiogram as his last echocardiogram was in 2018, and refer for EP evaluation.  Will make NPO after breakfast tomorrow in case PPM is pursued.   Risk Assessment/Risk Scores:           Severity of Illness: The appropriate patient status for this patient is INPATIENT. Inpatient status is judged to be reasonable and necessary in order to provide the required intensity of service to ensure the patient's safety. The patient's presenting symptoms, physical exam findings, and initial radiographic and laboratory data in the context of their chronic comorbidities is felt to place them at high risk for further clinical deterioration. Furthermore, it is not anticipated that the patient will be medically stable for discharge from the hospital within 2 midnights of admission.   * I certify that at the point of admission it is my clinical judgment that the patient will require inpatient hospital care spanning beyond 2 midnights from the point of admission due to high intensity of service, high risk for further deterioration and high frequency of surveillance required.*   For questions or updates, please contact [facility] Please consult [email or web address] for contact info under     Signed, Early Osmond, MD  02/25/2022 6:15 PM

## 2022-02-25 NOTE — ED Notes (Signed)
Pt denies pain, discomfort, light headedness, nausea, sob, dizziness. Endorses fatigue. Denies taking beta blocker. Family at [facility]. HR 38, on monitor and zoll.

## 2022-02-25 NOTE — ED Notes (Signed)
Pt HR noted to be irregular, rate in the 30-40s. Zoll applied to patient. Pt pale in appearance, alert and oriented, reporting fatigue. Primary RN notified.

## 2022-02-25 NOTE — ED Notes (Signed)
Cards at BS.

## 2022-02-25 NOTE — ED Triage Notes (Addendum)
Pt presents with ShOB and CP that has been present for "a couple of months," but this AM pt noted a HR of 33 when he woke up this AM. Pt denies lightheadedness. Family noted that pt appeared pale and weak over the past weekend.

## 2022-02-25 NOTE — Telephone Encounter (Signed)
STAT if HR is under 50 or over 120 (normal HR is 60-100 beats per minute)  What is your heart rate? 33  Do you have a log of your heart rate readings (document readings)? 33  Do you have any other symptoms? Sweaty when moving cannot walk.   Patient's wife states the patient's HR is very low and has been having trouble walking. She says he also gets sweaty moving. She says his oxygen is 90 %.

## 2022-02-26 ENCOUNTER — Inpatient Hospital Stay (HOSPITAL_COMMUNITY): Payer: PPO

## 2022-02-26 DIAGNOSIS — I495 Sick sinus syndrome: Secondary | ICD-10-CM

## 2022-02-26 DIAGNOSIS — R9431 Abnormal electrocardiogram [ECG] [EKG]: Secondary | ICD-10-CM

## 2022-02-26 DIAGNOSIS — I5189 Other ill-defined heart diseases: Secondary | ICD-10-CM

## 2022-02-26 HISTORY — DX: Other ill-defined heart diseases: I51.89

## 2022-02-26 LAB — CBC
HCT: 42 % (ref 39.0–52.0)
Hemoglobin: 14.8 g/dL (ref 13.0–17.0)
MCH: 31.8 pg (ref 26.0–34.0)
MCHC: 35.2 g/dL (ref 30.0–36.0)
MCV: 90.1 fL (ref 80.0–100.0)
Platelets: 136 10*3/uL — ABNORMAL LOW (ref 150–400)
RBC: 4.66 MIL/uL (ref 4.22–5.81)
RDW: 12.2 % (ref 11.5–15.5)
WBC: 6.1 10*3/uL (ref 4.0–10.5)
nRBC: 0 % (ref 0.0–0.2)

## 2022-02-26 LAB — BASIC METABOLIC PANEL
Anion gap: 7 (ref 5–15)
BUN: 18 mg/dL (ref 8–23)
CO2: 27 mmol/L (ref 22–32)
Calcium: 8.9 mg/dL (ref 8.9–10.3)
Chloride: 105 mmol/L (ref 98–111)
Creatinine, Ser: 1.2 mg/dL (ref 0.61–1.24)
GFR, Estimated: >60 mL/min (ref >60)
Glucose, Bld: 102 mg/dL — ABNORMAL HIGH (ref 70–99)
Potassium: 4.2 mmol/L (ref 3.5–5.1)
Sodium: 139 mmol/L (ref 135–145)

## 2022-02-26 LAB — ECHOCARDIOGRAM COMPLETE
Area-P 1/2: 2.08 cm2
Calc EF: 65.1 %
Height: 67 in
S' Lateral: 3.3 cm
Single Plane A2C EF: 69.3 %
Single Plane A4C EF: 63.9 %
Weight: 3707.2 oz

## 2022-02-26 LAB — PROTIME-INR
INR: 1 (ref 0.8–1.2)
Prothrombin Time: 13.5 seconds (ref 11.4–15.2)

## 2022-02-26 MED ORDER — HYDROCODONE-ACETAMINOPHEN 10-325 MG PO TABS
1.0000 | ORAL_TABLET | Freq: Four times a day (QID) | ORAL | Status: DC | PRN
  Administered 2022-02-26 – 2022-02-28 (×5): 1 via ORAL
  Filled 2022-02-26 (×5): qty 1

## 2022-02-26 NOTE — Progress Notes (Signed)
  Echocardiogram 2D Echocardiogram has been performed.  [name] 02/26/2022, 10:10 AM
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
## 2022-02-26 NOTE — Progress Notes (Signed)
   02/26/22 0845  Assess: MEWS Score  Level of Consciousness Alert  Assess: MEWS Score  MEWS Temp 0  MEWS Systolic 0  MEWS Pulse 2  MEWS RR 0  MEWS LOC 0  MEWS Score 2  MEWS Score Color Yellow  Assess: if the MEWS score is Yellow or Red  Were vital signs taken at a resting state? Yes  Focused Assessment No change from prior assessment  MEWS guidelines implemented *See Row Information* Yes  Treat  MEWS Interventions Other (Comment) (patient here with bradycardia, needs PPM)  Early Detection of Sepsis Score *See Row Information* Low  Take Vital Signs  Increase Vital Sign Frequency  Yellow: Q 2hr X 2 then Q 4hr X 2, if remains yellow, continue Q 4hrs  Escalate  MEWS: Escalate Yellow: discuss with charge nurse/RN and consider discussing with provider and RRT  Notify: Charge Nurse/RN  Name of Charge Nurse/RN Notified Davide Risdon rn  Date Charge Nurse/RN Notified 02/26/22  Time Charge Nurse/RN Notified 0845  Document  Patient Outcome Other (Comment) (patient here from bradycardia, holding clonidine per Tommye Standard, PA, and patient will receive PPM this admission)  Progress note created (see row info) Yes

## 2022-02-26 NOTE — Progress Notes (Signed)
Pt stated that he takes an herbal remedy at home for pain, Kratam (1/2 tbsp) twice a day. Pt educated on informing MD on herbal medications as they can potentially interact with prescription medications.   Elaina Hoops, RN

## 2022-02-26 NOTE — Consult Note (Addendum)
Cardiology Consultation:   Patient ID: Randy Kennedy MRN: [id number]; DOB: 1950/08/29  Admit date: 02/25/2022 Date of Consult: 02/26/2022  PCP:  Randy Bush, MD   [facility] Cardiologist:  Dr. Rockey Kennedy  Patient Profile:   Randy Kennedy is a 71 y.o. male with a hx of HTN, HLD, obesity, OSA not compliant with CPAP, RBBB, baseline bradycardia who is being seen 02/26/2022 for the evaluation of symptomatic bradycardia at the request of Dr. Ali Kennedy.  History of Present Illness:   Randy Kennedy saw Dr. Rockey Kennedy last month with reports of increased general fatigue and, atypical CP and some degree of SOB that was described as chronic.  Noted an slow upwards trend in weight that was felt to contribute to his symptoms, including fatigue along with poor CPAP compliance.  He was admitted last night  progressive fatigue and reports of a near syncope.  (That happened a week or 2 ago) lasted as long as 15 minutes  He was observed to have SB with rates dipping into the 30s and cardiology called and he was admitted for further evaluation.  Planned to update hi echo and consult EP in the AM for possible pacing  LABS K+ 4.2 BUN/Creat 18/1.20 WBC 6.1 H/H 14/42 Plts 136  Home med reviewed, he is on clonidine 0.'1mg'$  BID, no other potential nodal blocking agents 1/2 life is 12 hours or so  The patient reports that yesterday he felt so weak that he could hardly get his legs going to walk.  His wife mentions that their daughter ( a Marine scientist) advised that they start monitoring his HR/O2 sat and yesterday his wife placed the pulse ox on him while he was asleep and noted his HR in the 30's and O2 sat about 90, and brought him in.  He reports that the cardura Dr. Rockey Kennedy had him on caused a rash and this was stopped, clonidine added. Note that he has been intolerant of/allergies to a number of agents.  Amlodipine reportedly cause a R groin rash Lisinopril/HCTz cause burning in his  feet Doxazosin rash Nifedipine cause bradycardia, edema And Dr. Rockey Kennedy also mentions brady with betablockers  Past Medical History:  Diagnosis Date   Allergic rhinitis    Arthritis    Chest pain    a. 11/2011 reportedly normal stress test performed in [place].   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 moderate, AHI 37.4 increased to 80 in REM, on BiPAP 14/10, 97% compliance >4 hrs (06/2013); b. Does not tolerate CPAP.   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 Treated with vicodin, prior tried fentanyl and butrans.   Systolic murmur    Wears dentures    Partial lower   Wears hearing aid in both ears    Does not wear all the time    Past Surgical History:  Procedure Laterality Date   CATARACT EXTRACTION W/PHACO Right 11/26/2020   Procedure: CATARACT EXTRACTION PHACO AND INTRAOCULAR LENS PLACEMENT ([facility]) RIGHT VIVITY TORIC LENS 7.17 00:49.6;  Surgeon: Randy Robson, MD;  Location: [facility];  Service: Ophthalmology;  Laterality: Right;  Latex Sleep Apnea   CATARACT EXTRACTION W/PHACO Left  12/10/2020   Procedure: CATARACT EXTRACTION PHACO AND INTRAOCULAR LENS PLACEMENT ([place]) LEFT VIVITY;  Surgeon: Randy Robson, MD;  Location: [place];  Service: Ophthalmology;  Laterality: Left;  Latex Sleep apnea 5.37 00:32.7   COLONOSCOPY  02/2011   ext hem, benign polyp, rpt 5 yrs ([place])   COLONOSCOPY WITH PROPOFOL N/A 12/17/2020   multiple TA, ext hem, rpt 3 yrs (Randy Kennedy, Randy Due, MD)   LEG SURGERY  x5   left - after fall at work (3.5 stories)   [place]   jaw fracture - horse kick   MOHS SURGERY Right 07/03/2021   Ear   TYMPANIC MEMBRANE REPAIR Left 1994   otosclerosis     Home Medications:  Prior to Admission medications   Medication Sig Start Date End Date Taking? Authorizing Provider  aspirin EC 81 MG tablet Take 81 mg by mouth daily.   Yes [provider]  cloNIDine (CATAPRES) 0.1 MG tablet Take 1 tablet (0.1 mg total) by mouth 2 (two) times daily. 02/02/22  Yes Gollan, Randy November, MD  colchicine 0.6 MG tablet TAKE ONE TABLET BY MOUTH ONCE DAILY Patient taking differently: Take 0.6 mg by mouth daily. 01/14/22  Yes Randy Bush, MD  fexofenadine (ALLEGRA) 180 MG tablet Take 180 mg by mouth daily.   Yes [provider]  furosemide (LASIX) 20 MG tablet Take 1 tablet (20 mg total) by mouth 3 (three) times a week. 12/29/21  Yes Gollan, Randy November, MD  gabapentin (NEURONTIN) 300 MG capsule TAKE ONE CAPSULE BY MOUTH TWICE DAILY and TAKE TWO CAPSULES EVERYDAY AT BEDTIME Patient taking differently: See admin instructions. Take one capsule by mouth twice daily and take two capsules everyday at bedtime 01/14/22  Yes Randy Bush, MD  hydrALAZINE (APRESOLINE) 100 MG tablet TAKE 1 AND 1/2 TABLETS BY MOUTH THREE TIMES DAILY, MAY increase TO FOUR TIMES DAILY AS NEEDED FOR HIGH BLOOD PRESSURE Patient taking differently: Take 150 mg by mouth See admin instructions. TAKE 1 AND 1/2 TABLETS BY MOUTH THREE TIMES DAILY, MAY increase TO FOUR TIMES  DAILY AS NEEDED FOR HIGH BLOOD PRESSURE 01/22/21  Yes Randy Bush, MD  HYDROcodone-acetaminophen (NORCO) 10-325 MG tablet TAKE ONE TABLET BY MOUTH every SIX hours AS NEEDED Patient taking differently: Take 1 tablet by mouth every 6 (six) hours as needed for moderate pain. 02/11/22  Yes Randy Bush, MD  meloxicam (MOBIC) 15 MG tablet TAKE 1 TABLET BY MOUTH ONCE DAILY AS NEEDED FOR  GOUT  FLARE Patient taking differently: Take 15 mg by mouth daily as needed (gout flare). 04/05/19  Yes Randy Bush, MD  pravastatin (PRAVACHOL) 40 MG tablet TAKE ONE TABLET BY MOUTH ONCE DAILY Patient taking differently: Take 40 mg by mouth daily. 01/16/22  Yes Randy Bush, MD  probenecid (  BENEMID) 500 MG tablet TAKE 1/2 TABLET BY MOUTH TWICE DAILY Patient taking differently: Take 250 mg by mouth 2 (two) times daily. TAKE 1/2 TABLET BY MOUTH TWICE DAILY 01/19/22  Yes Randy Bush, MD  valsartan (DIOVAN) 320 MG tablet TAKE ONE TABLET BY MOUTH ONCE DAILY Patient taking differently: Take 320 mg by mouth daily. 01/12/22  Yes Randy Bush, MD  vitamin C (ASCORBIC ACID) 500 MG tablet Take 500 mg by mouth daily.   Yes [provider]  nitroGLYCERIN (NITROSTAT) 0.4 MG SL tablet Place 1 tablet (0.4 mg total) under the tongue every 5 (five) minutes as needed for chest pain. 02/16/13   Minna Merritts, MD  predniSONE (DELTASONE) 20 MG tablet Take 1 tablet (20 mg total) by mouth daily. For 1-2 days prn gout flare Patient not taking: Reported on 02/25/2022 04/06/19   Randy Bush, MD    Inpatient Medications: Scheduled Meds:  aspirin EC  81 mg Oral Daily   cloNIDine  0.1 mg Oral BID   [START ON 02/27/2022] furosemide  20 mg Oral Once per day on Mon Wed Fri   gabapentin  300 mg Oral BID   heparin  5,000 Units Subcutaneous Q8H   hydrALAZINE  100 mg Oral Q8H   irbesartan  300 mg Oral Daily   pravastatin  40 mg Oral Daily   Continuous Infusions:  PRN  Meds: HYDROcodone-acetaminophen  Allergies:    Allergies  Allergen Reactions   Amlodipine Rash    Rash in groin   Allopurinol Other (See Comments)    GI upset   Lisinopril-Hydrochlorothiazide     Burning in feet.   Procardia [Nifedipine] Other (See Comments)    Bradycardia, leg swelling   Spironolactone Other (See Comments)    malaise, dizziness   Clonidine Derivatives Rash   Doxazosin Mesylate Rash   Latex Rash   Nortriptyline Rash   Uloric [Febuxostat] Rash    Social History:   Social History   Socioeconomic History   Marital status: Married    Spouse name: Not on file   Number of children: Not on file   Years of education: Not on file   Highest education level: Not on file  Occupational History   Not on file  Tobacco Use   Smoking status: Former    Packs/day: 1.00    Years: 4.00    Total pack years: 4.00    Types: Pipe, Cigarettes    Quit date: 07/27/1978    Years since quitting: 43.6   Smokeless tobacco: Never  Vaping Use   Vaping Use: Never used  Substance and Sexual Activity   Alcohol use: No    Alcohol/week: 0.0 standard drinks of alcohol   Drug use: No   Sexual activity: Not on file  Other Topics Concern   Not on file  Social History Narrative   Caffeine: 2 cans coke/day   Lives with wife and grown son    Occupation: retired, was Agricultural engineer.   On disability for chronic pain   Edu: MBA   Activity: no regular exercise   Deit: good water, fruits/vegetables daily   Social Determinants of Health   Financial Resource Strain: Low Risk  (10/02/2021)   Overall Financial Resource Strain (CARDIA)    Difficulty of Paying Living Expenses: Not hard at all  Food Insecurity: No Food Insecurity (10/02/2021)   Hunger Vital Sign    Worried About Running Out of Food in the Last Year: Never true    Ran Out of Food  in the Last Year: Never true  Transportation Needs: No Transportation Needs (10/02/2021)   PRAPARE - Hydrologist  (Medical): No    Lack of Transportation (Non-Medical): No  Physical Activity: Unknown (10/02/2021)   Exercise Vital Sign    Days of Exercise per Week: 7 days    Minutes of Exercise per Session: Not on file  Stress: No Stress Concern Present (10/02/2021)   [place]    Feeling of Stress : Not at all  Social Connections: Moderately Isolated (10/02/2021)   Social Connection and Isolation Panel [NHANES]    Frequency of Communication with Friends and Family: More than three times a week    Frequency of Social Gatherings with Friends and Family: More than three times a week    Attends Religious Services: Never    Marine scientist or Organizations: No    Attends Archivist Meetings: Never    Marital Status: Married  Human resources officer Violence: Not At Risk (10/02/2021)   Humiliation, Afraid, Rape, and Kick questionnaire    Fear of Current or Ex-Partner: No    Emotionally Abused: No    Physically Abused: No    Sexually Abused: No    Family History:   Family History  Problem Relation Age of Onset   Cancer Father 91       prostate   Cancer Mother        rectal   Hypertension Mother    Cancer Paternal Uncle        prostate   Diabetes Neg Hx    Stroke Neg Hx    CAD Neg Hx      ROS:  Please see the history of present illness.  All other ROS reviewed and negative.     Physical Exam/Data:   Vitals:   02/25/22 2018 02/26/22 0133 02/26/22 0403 02/26/22 0844  BP: (!) 184/79 (!) 153/59 (!) 164/76   Pulse: (!) 48 66 68 (!) 37  Resp: 20   16  Temp: 98 F (36.7 C) 98 F (36.7 C) 97.9 F (36.6 C) 98.6 F (37 C)  TempSrc: Oral Oral Oral Oral  SpO2: 93% 93% 91%   Weight: 105.1 kg     Height: '5\' 7"'$  (1.702 m)       Intake/Output Summary (Last 24 hours) at 02/26/2022 0947 Last data filed at 02/25/2022 1837 Gross per 24 hour  Intake --  Output 450 ml  Net -450 ml      02/25/2022    8:18 PM 02/25/2022   12:30 PM  12/29/2021    8:41 AM  Last 3 Weights  Weight (lbs) 231 lb 11.2 oz 215 lb 232 lb 4 oz  Weight (kg) 105.098 kg 97.523 kg 105.348 kg     Body mass index is 36.29 kg/m.  General:  Well nourished, well developed, in no acute distress HEENT: normal Neck: no JVD Vascular: No carotid bruits; Distal pulses 2+ bilaterally Cardiac:  RRR; no murmurs, gallops or rubs Lungs:  CTA b/l, no wheezing, rhonchi or rales  Abd: soft, nontender, obese  Ext: no edema Musculoskeletal:  No deformities Skin: warm and dry  Neuro:  no focal gross motor abnormalities noted Psych:  Normal affect   EKG:  The EKG was personally reviewed and demonstrates:    SB 45bpm, PAC, RBBB , borderline LAD SB 36bpm, RBBB, PAC  Old 12/29/21 SR 50bpm, RBBB   Telemetry:  Telemetry was personally reviewed  and demonstrates:   SB 30's-40's some PACs, no heart block  Relevant CV Studies:  Echo has been just been done, pending read  Echo 06/11/2017 Normal EF, elevated RVSP   Echocardiogram Kennedy 2018 Left ventricle: The cavity size was normal. There was moderate   concentric hypertrophy. Systolic function was normal. The   estimated ejection fraction was in the range of 55% to 60%. Wall   motion was normal; there were no regional wall motion   abnormalities. Features are consistent with a pseudonormal left   ventricular filling pattern, with concomitant abnormal relaxation   and increased filling pressure (grade 2 diastolic dysfunction). - Aortic valve: There was trivial regurgitation. - Mitral valve: There was mild regurgitation. - Left atrium: The atrium was mildly dilated. - Right atrium: The atrium was moderately dilated. - Pulmonary arteries: Systolic pressure was moderately increased.   PA peak pressure: 48 mm Hg (S).  Laboratory Data:  High Sensitivity Troponin:   Recent Labs  Lab 02/25/22 1245 02/25/22 1741  TROPONINIHS 11 12     Chemistry Recent Labs  Lab 02/25/22 1245 02/25/22 2037  02/26/22 0315  NA 139  --  139  K 3.9  --  4.2  CL 106  --  105  CO2 26  --  27  GLUCOSE 104*  --  102*  BUN 18  --  18  CREATININE 1.23 1.20 1.20  CALCIUM 9.1  --  8.9  GFRNONAA >60 >60 >60  ANIONGAP 7  --  7    No results for input(s): "PROT", "ALBUMIN", "AST", "ALT", "ALKPHOS", "BILITOT" in the last 168 hours. Lipids No results for input(s): "CHOL", "TRIG", "HDL", "LABVLDL", "LDLCALC", "CHOLHDL" in the last 168 hours.  Hematology Recent Labs  Lab 02/25/22 1245 02/25/22 2037 02/26/22 0315  WBC 7.1 6.7 6.1  RBC 4.71 4.86 4.66  HGB 14.9 15.3 14.8  HCT 42.2 43.6 42.0  MCV 89.6 89.7 90.1  MCH 31.6 31.5 31.8  MCHC 35.3 35.1 35.2  RDW 12.1 12.4 12.2  PLT 141* 139* 136*   Thyroid No results for input(s): "TSH", "FREET4" in the last 168 hours.  BNPNo results for input(s): "BNP", "PROBNP" in the last 168 hours.  DDimer No results for input(s): "DDIMER" in the last 168 hours.   Radiology/Studies:  DG Chest 2 View Result Date: 02/25/2022 CLINICAL DATA:  Chest pain. EXAM: CHEST - 2 VIEW COMPARISON:  August 26, 2020 FINDINGS: Tortuosity of the aorta. Cardiomediastinal silhouette is normal. Mediastinal contours appear intact. There is no evidence of focal airspace consolidation, pleural effusion or pneumothorax. Osseous structures are without acute abnormality. Soft tissues are grossly normal. IMPRESSION: No active cardiopulmonary disease. Electronically Signed   By: Fidela Salisbury M.D.   On: 02/25/2022 13:10     Assessment and Plan:   Symptomatic sinus bradycardia No AV block Baseline RBBB Symptoms have gotten worse with the addition of the clonidine Clonidine can cause bradycardia Seems his BP management has proven difficult with baseline bradycardia and intolerances.  I have stopped his clonidine  He at rest feels OK, mentating well, and BP is stable  I do not anticipate pacing today, have advised clears for now and then NPO until Dr. Curt Bears has had an opportunity to  see him.  2. CP L chest, sharp, ongoing for years without change, has been felt radiating from an old L scapular injury Sounds atypical    Risk Assessment/Risk Scores:    For questions or updates, please contact [place] Please consult [email or web address] for  contact info under    Signed, [name], PA-C  02/26/2022 9:47 AM;  I have seen and examined this patient with Tommye Standard.  Agree with above, note added to reflect my findings.  He has a past history as above.  He presented to the hospital with fatigue, weakness, shortness of breath.  He was noted at home to have heart rates in the 30s.  He has been feeling poorly over the last few months, but acutely worse over the last 2 weeks.  He was started on clonidine during this time.  He states that his legs feel weak and he is not able to do all of his daily activities Kennedy to his level of fatigue and sluggishness.  GEN: Well nourished, well developed, in no acute distress  HEENT: normal  Neck: no JVD, carotid bruits, or masses Cardiac: Bradycardic, regular; no murmurs, rubs, or gallops,no edema  Respiratory:  clear to auscultation bilaterally, normal work of breathing GI: soft, nontender, nondistended, + BS MS: no deformity or atrophy  Skin: warm and dry Neuro:  Strength and sensation are intact Psych: euthymic mood, full affect   Sick sinus syndrome: Patient has a baseline slow heart rate which has gotten even slower on clonidine.  Clonidine has since been stopped.  We Randy Kennedy watch him overnight.  If he has an improvement in his heart rate and symptoms improved, he may be able to avoid pacemaker implant.  If he remains bradycardic with symptoms of weakness and fatigue, pacemaker implant would be reasonable.  Kimberl Vig M. Denim Kalmbach MD 02/26/2022 5:29 PM

## 2022-02-27 ENCOUNTER — Encounter (HOSPITAL_COMMUNITY)
Admission: EM | Disposition: A | Discharge status: Home / Self Care | Payer: Self-pay | Source: Home / Self Care | Attending: Internal Medicine

## 2022-02-27 ENCOUNTER — Encounter (HOSPITAL_COMMUNITY): Payer: Self-pay | Admitting: Anesthesiology

## 2022-02-27 ENCOUNTER — Ambulatory Visit: Payer: PPO | Admitting: Physician Assistant

## 2022-02-27 DIAGNOSIS — Z950 Presence of cardiac pacemaker: Secondary | ICD-10-CM

## 2022-02-27 DIAGNOSIS — I495 Sick sinus syndrome: Secondary | ICD-10-CM | POA: Diagnosis not present

## 2022-02-27 HISTORY — DX: Presence of cardiac pacemaker: Z95.0

## 2022-02-27 HISTORY — PX: PACEMAKER IMPLANT: EP1218

## 2022-02-27 LAB — SURGICAL PCR SCREEN
MRSA, PCR: NEGATIVE
Staphylococcus aureus: NEGATIVE

## 2022-02-27 SURGERY — PACEMAKER IMPLANT

## 2022-02-27 MED ORDER — LIDOCAINE HCL (PF) 1 % IJ SOLN
INTRAMUSCULAR | Status: DC | PRN
  Administered 2022-02-27: 60 mL

## 2022-02-27 MED ORDER — LIDOCAINE HCL (PF) 1 % IJ SOLN
INTRAMUSCULAR | Status: AC
  Filled 2022-02-27: qty 60

## 2022-02-27 MED ORDER — ACETAMINOPHEN 325 MG PO TABS
325.0000 mg | ORAL_TABLET | ORAL | Status: DC | PRN
  Administered 2022-02-28: 650 mg via ORAL
  Filled 2022-02-27: qty 2

## 2022-02-27 MED ORDER — HEPARIN (PORCINE) IN NACL 1000-0.9 UT/500ML-% IV SOLN
INTRAVENOUS | Status: DC | PRN
  Administered 2022-02-27: 500 mL

## 2022-02-27 MED ORDER — SODIUM CHLORIDE 0.9 % IV SOLN
250.0000 mL | INTRAVENOUS | Status: DC
  Administered 2022-02-27: 250 mL via INTRAVENOUS

## 2022-02-27 MED ORDER — SODIUM CHLORIDE 0.9 % IV SOLN
80.0000 mg | INTRAVENOUS | Status: AC
  Administered 2022-02-27: 80 mg

## 2022-02-27 MED ORDER — SODIUM CHLORIDE 0.9% FLUSH: 3.0000 mL | INTRAVENOUS | Status: DC | PRN

## 2022-02-27 MED ORDER — ONDANSETRON HCL 4 MG/2ML IJ SOLN
4.0000 mg | Freq: Four times a day (QID) | INTRAMUSCULAR | Status: DC | PRN
Start: 2022-02-27 — End: 2022-02-28

## 2022-02-27 MED ORDER — CEFAZOLIN SODIUM-DEXTROSE 2-4 GM/100ML-% IV SOLN
2.0000 g | INTRAVENOUS | Status: AC
  Administered 2022-02-27: 2 g via INTRAVENOUS

## 2022-02-27 MED ORDER — CEFAZOLIN SODIUM-DEXTROSE 1-4 GM/50ML-% IV SOLN
1.0000 g | Freq: Four times a day (QID) | INTRAVENOUS | Status: AC
  Administered 2022-02-27 – 2022-02-28 (×3): 1 g via INTRAVENOUS
  Filled 2022-02-27 (×3): qty 50

## 2022-02-27 MED ORDER — SODIUM CHLORIDE 0.9% FLUSH
3.0000 mL | Freq: Two times a day (BID) | INTRAVENOUS | Status: DC
  Administered 2022-02-27 (×2): 3 mL via INTRAVENOUS

## 2022-02-27 MED ORDER — SODIUM CHLORIDE 0.9 % IV SOLN
INTRAVENOUS | Status: AC
  Filled 2022-02-27: qty 2

## 2022-02-27 MED ORDER — CHLORHEXIDINE GLUCONATE 4 % EX LIQD: 60.0000 mL | Freq: Once | CUTANEOUS | Status: DC

## 2022-02-27 MED ORDER — CHLORHEXIDINE GLUCONATE 4 % EX LIQD
60.0000 mL | Freq: Once | CUTANEOUS | Status: AC
  Administered 2022-02-27: 4 via TOPICAL

## 2022-02-27 MED ORDER — SODIUM CHLORIDE 0.9 % IV SOLN: INTRAVENOUS | Status: DC

## 2022-02-27 MED ORDER — CEFAZOLIN SODIUM-DEXTROSE 2-4 GM/100ML-% IV SOLN
INTRAVENOUS | Status: AC
  Filled 2022-02-27: qty 100

## 2022-02-27 MED ORDER — CARVEDILOL 12.5 MG PO TABS
12.5000 mg | ORAL_TABLET | Freq: Two times a day (BID) | ORAL | Status: DC
  Administered 2022-02-27: 12.5 mg via ORAL
  Filled 2022-02-27: qty 1

## 2022-02-27 MED ORDER — LORATADINE 10 MG PO TABS
10.0000 mg | ORAL_TABLET | Freq: Every day | ORAL | Status: DC
  Administered 2022-02-27 – 2022-02-28 (×2): 10 mg via ORAL
  Filled 2022-02-27 (×2): qty 1

## 2022-02-27 MED ORDER — HEPARIN (PORCINE) IN NACL 1000-0.9 UT/500ML-% IV SOLN
INTRAVENOUS | Status: AC
  Filled 2022-02-27: qty 500

## 2022-02-27 SURGICAL SUPPLY — 15 items
CABLE SURGICAL S-101-97-12 (CABLE) ×2 IMPLANT
CATH CPS LOCATOR 3D MED (CATHETERS) ×1 IMPLANT
CPS IMPLANT KIT 410190 (MISCELLANEOUS) ×1 IMPLANT
HELIX LOCKING TOOL (MISCELLANEOUS) ×2
LEAD TENDRIL MRI 52CM LPA1200M (Lead) ×1 IMPLANT
LEAD TENDRIL SDX 2088TC-58CM (Lead) ×1 IMPLANT
MAT PREVALON FULL STRYKER (MISCELLANEOUS) ×1 IMPLANT
PACEMAKER ASSURITY DR-RF (Pacemaker) ×1 IMPLANT
PAD DEFIB RADIO PHYSIO CONN (PAD) ×2 IMPLANT
SHEATH 8FR PRELUDE SNAP 13 (SHEATH) ×1 IMPLANT
SHEATH 9FR PRELUDE SNAP 13 (SHEATH) ×1 IMPLANT
SHEATH PROBE COVER 6X72 (BAG) ×1 IMPLANT
SLITTER AGILIS HISPRO (INSTRUMENTS) ×1 IMPLANT
TOOL HELIX LOCKING (MISCELLANEOUS) IMPLANT
TRAY PACEMAKER INSERTION (PACKS) ×2 IMPLANT

## 2022-02-27 NOTE — H&P (View-Only) (Signed)
Progress Note  Patient Name: Randy Kennedy Date of Encounter: 02/27/2022  [facility] Cardiologist: Dr. Rockey Situ  Subjective   No new complaints  Inpatient Medications    Scheduled Meds:  aspirin EC  81 mg Oral Daily   furosemide  20 mg Oral Once per day on Mon Wed Fri   gabapentin  300 mg Oral BID   hydrALAZINE  100 mg Oral Q8H   irbesartan  300 mg Oral Daily   pravastatin  40 mg Oral Daily   sodium chloride flush  3 mL Intravenous Q12H   Continuous Infusions:  PRN Meds: HYDROcodone-acetaminophen   Vital Signs    Vitals:   02/26/22 1205 02/26/22 1511 02/26/22 2230 02/27/22 0625  BP: (!) 169/69 (!) 164/72 (!) 144/60 (!) 196/76  Pulse: (!) 36  (!) 41 (!) 41  Resp: '16  16 16  '$ Temp: 97.7 F (36.5 C)  98.1 F (36.7 C) 98.3 F (36.8 C)  TempSrc: Oral  Oral Oral  SpO2: 95%  95% 96%  Weight:      Height:        Intake/Output Summary (Last 24 hours) at 02/27/2022 0828 Last data filed at 02/26/2022 1527 Gross per 24 hour  Intake 1060 ml  Output --  Net 1060 ml      02/25/2022    8:18 PM 02/25/2022   12:30 PM 12/29/2021    8:41 AM  Last 3 Weights  Weight (lbs) 231 lb 11.2 oz 215 lb 232 lb 4 oz  Weight (kg) 105.098 kg 97.523 kg 105.348 kg      Telemetry    SB largely the 40's now - Personally Reviewed  ECG    No new EKGs - Personally Reviewed  Physical Exam   GEN: No acute distress.   Neck: No JVD Cardiac: RRR, bradycardic, no murmurs, rubs, or gallops.  Respiratory: CTA b/l. GI: Soft, nontender, non-distended  MS: No edema; No deformity. Neuro:  Nonfocal  Psych: Normal affect   Labs    High Sensitivity Troponin:   Recent Labs  Lab 02/25/22 1245 02/25/22 1741  TROPONINIHS 11 12     Chemistry Recent Labs  Lab 02/25/22 1245 02/25/22 2037 02/26/22 0315  NA 139  --  139  K 3.9  --  4.2  CL 106  --  105  CO2 26  --  27  GLUCOSE 104*  --  102*  BUN 18  --  18  CREATININE 1.23 1.20 1.20  CALCIUM 9.1  --  8.9  GFRNONAA >60 >60 >60   ANIONGAP 7  --  7    Lipids No results for input(s): "CHOL", "TRIG", "HDL", "LABVLDL", "LDLCALC", "CHOLHDL" in the last 168 hours.  Hematology Recent Labs  Lab 02/25/22 1245 02/25/22 2037 02/26/22 0315  WBC 7.1 6.7 6.1  RBC 4.71 4.86 4.66  HGB 14.9 15.3 14.8  HCT 42.2 43.6 42.0  MCV 89.6 89.7 90.1  MCH 31.6 31.5 31.8  MCHC 35.3 35.1 35.2  RDW 12.1 12.4 12.2  PLT 141* 139* 136*   Thyroid No results for input(s): "TSH", "FREET4" in the last 168 hours.  BNPNo results for input(s): "BNP", "PROBNP" in the last 168 hours.  DDimer No results for input(s): "DDIMER" in the last 168 hours.   Radiology     Cardiac Studies   02/26/22; TTE  1. Left ventricular ejection fraction, by estimation, is 60 to 65%. The  left ventricle has normal function. The left ventricle has no regional  wall motion abnormalities.  Left ventricular diastolic parameters are  consistent with Grade II diastolic  dysfunction (pseudonormalization).   2. Right ventricular systolic function is normal. The right ventricular  size is moderately enlarged. There is normal pulmonary artery systolic  pressure.   3. Left atrial size was moderately dilated.   4. Right atrial size was severely dilated.   5. The mitral valve is normal in structure. No evidence of mitral valve  regurgitation. No evidence of mitral stenosis.   6. The aortic valve is tricuspid. Aortic valve regurgitation is trivial.  Aortic valve sclerosis/calcification is present, without any evidence of  aortic stenosis.   7. The inferior vena cava is normal in size with greater than 50%  respiratory variability, suggesting right atrial pressure of 3 mmHg.    Echo 06/11/2017 Normal EF, elevated RVSP   Echocardiogram November 2018 Left ventricle: The cavity size was normal. There was moderate   concentric hypertrophy. Systolic function was normal. The   estimated ejection fraction was in the range of 55% to 60%. Wall   motion was normal; there were  no regional wall motion   abnormalities. Features are consistent with a pseudonormal left   ventricular filling pattern, with concomitant abnormal relaxation   and increased filling pressure (grade 2 diastolic dysfunction). - Aortic valve: There was trivial regurgitation. - Mitral valve: There was mild regurgitation. - Left atrium: The atrium was mildly dilated. - Right atrium: The atrium was moderately dilated. - Pulmonary arteries: Systolic pressure was moderately increased.   PA peak pressure: 48 mm Hg (S).  Patient Profile     71 y.o. male HTN, HLD, obesity, OSA not compliant with CPAP, RBBB, baseline bradycardia admitted with progressive fatigue and SB  Assessment & Plan    Symptomatic sinus bradycardia No AV block Baseline RBBB  HR with only minimal improvement now about 48 hours off clonidine. Tova Vater plan for PPM today Dr. Curt Bears discussed with patient/family at bedside, they are agreeable to proceed.  2. HTN Waxes/wanes here Clonidine historically reported as an allergy w/rash though was tolerating it well the last couple weeks from this perspective with reports of good BP control. We Anneta Rounds resume post pacing A referral to the HTN clinic has been made. They Jamelah Sitzer reach out to him to schedule  3. Untreated OSA 4. Obesity Long discussion today on our suspicion that his chronic fatigue, daytime sleepiness, and somnolence most likely is multifactorial Pacing perhaps Findley Blankenbaker help but urged to f/u with sleep specialist to find a therapy that he can tolerate, perhaps may need an allergist/ENT to help given his intolerance to therapy seems to revolve around his allergies/sinuses Perhaps a dental appliance  RA/RV enlarged, normal Pulm pressures     For questions or updates, please contact [place] Please consult [email or web address] for contact info under        Signed, [name], PA-C  02/27/2022, 8:28 AM    I have seen and examined this patient with Tommye Standard.   Agree with above, note added to reflect my findings.  He continues to have episodes of weakness and fatigue.  He is bradycardic.  We Wealthy Danielski plan for pacemaker implant.  He does understand that his sleep apnea is playing a part.    GEN: Well nourished, well developed, in no acute distress  HEENT: normal  Neck: no JVD, carotid bruits, or masses Cardiac: RRR; no murmurs, rubs, or gallops,no edema  Respiratory:  clear to auscultation bilaterally, normal work of breathing GI: soft, nontender, nondistended, + BS MS:  no deformity or atrophy  Skin: warm and dry Neuro:  Strength and sensation are intact Psych: euthymic mood, full affect   Sick sinus syndrome: Patient continues to have episodes of bradycardia, weakness, fatigue.  We Mazikeen Hehn plan for pacemaker implant.  Risk and benefits were discussed with bleeding, infection, pneumothorax,  Zeffie Bickert M. Ivet Guerrieri MD 02/27/2022 12:27 PM

## 2022-02-27 NOTE — Care Management Important Message (Signed)
Important Message  Patient Details  Name: Randy Kennedy MRN: [id number] Date of Birth: Oct 14, 1950   Medicare Important Message Given:  Yes     [name] 02/27/2022, 2:51 PM

## 2022-02-27 NOTE — Progress Notes (Addendum)
Progress Note  Patient Name: Randy Kennedy Date of Encounter: 02/27/2022  [facility] Cardiologist: Dr. Rockey Situ  Subjective   No new complaints  Inpatient Medications    Scheduled Meds:  aspirin EC  81 mg Oral Daily   furosemide  20 mg Oral Once per day on Mon Wed Fri   gabapentin  300 mg Oral BID   hydrALAZINE  100 mg Oral Q8H   irbesartan  300 mg Oral Daily   pravastatin  40 mg Oral Daily   sodium chloride flush  3 mL Intravenous Q12H   Continuous Infusions:  PRN Meds: HYDROcodone-acetaminophen   Vital Signs    Vitals:   02/26/22 1205 02/26/22 1511 02/26/22 2230 02/27/22 0625  BP: (!) 169/69 (!) 164/72 (!) 144/60 (!) 196/76  Pulse: (!) 36  (!) 41 (!) 41  Resp: '16  16 16  '$ Temp: 97.7 F (36.5 C)  98.1 F (36.7 C) 98.3 F (36.8 C)  TempSrc: Oral  Oral Oral  SpO2: 95%  95% 96%  Weight:      Height:        Intake/Output Summary (Last 24 hours) at 02/27/2022 0828 Last data filed at 02/26/2022 1527 Gross per 24 hour  Intake 1060 ml  Output --  Net 1060 ml      02/25/2022    8:18 PM 02/25/2022   12:30 PM 12/29/2021    8:41 AM  Last 3 Weights  Weight (lbs) 231 lb 11.2 oz 215 lb 232 lb 4 oz  Weight (kg) 105.098 kg 97.523 kg 105.348 kg      Telemetry    SB largely the 40's now - Personally Reviewed  ECG    No new EKGs - Personally Reviewed  Physical Exam   GEN: No acute distress.   Neck: No JVD Cardiac: RRR, bradycardic, no murmurs, rubs, or gallops.  Respiratory: CTA b/l. GI: Soft, nontender, non-distended  MS: No edema; No deformity. Neuro:  Nonfocal  Psych: Normal affect   Labs    High Sensitivity Troponin:   Recent Labs  Lab 02/25/22 1245 02/25/22 1741  TROPONINIHS 11 12     Chemistry Recent Labs  Lab 02/25/22 1245 02/25/22 2037 02/26/22 0315  NA 139  --  139  K 3.9  --  4.2  CL 106  --  105  CO2 26  --  27  GLUCOSE 104*  --  102*  BUN 18  --  18  CREATININE 1.23 1.20 1.20  CALCIUM 9.1  --  8.9  GFRNONAA >60 >60 >60   ANIONGAP 7  --  7    Lipids No results for input(s): "CHOL", "TRIG", "HDL", "LABVLDL", "LDLCALC", "CHOLHDL" in the last 168 hours.  Hematology Recent Labs  Lab 02/25/22 1245 02/25/22 2037 02/26/22 0315  WBC 7.1 6.7 6.1  RBC 4.71 4.86 4.66  HGB 14.9 15.3 14.8  HCT 42.2 43.6 42.0  MCV 89.6 89.7 90.1  MCH 31.6 31.5 31.8  MCHC 35.3 35.1 35.2  RDW 12.1 12.4 12.2  PLT 141* 139* 136*   Thyroid No results for input(s): "TSH", "FREET4" in the last 168 hours.  BNPNo results for input(s): "BNP", "PROBNP" in the last 168 hours.  DDimer No results for input(s): "DDIMER" in the last 168 hours.   Radiology     Cardiac Studies   02/26/22; TTE  1. Left ventricular ejection fraction, by estimation, is 60 to 65%. The  left ventricle has normal function. The left ventricle has no regional  wall motion abnormalities.  Left ventricular diastolic parameters are  consistent with Grade II diastolic  dysfunction (pseudonormalization).   2. Right ventricular systolic function is normal. The right ventricular  size is moderately enlarged. There is normal pulmonary artery systolic  pressure.   3. Left atrial size was moderately dilated.   4. Right atrial size was severely dilated.   5. The mitral valve is normal in structure. No evidence of mitral valve  regurgitation. No evidence of mitral stenosis.   6. The aortic valve is tricuspid. Aortic valve regurgitation is trivial.  Aortic valve sclerosis/calcification is present, without any evidence of  aortic stenosis.   7. The inferior vena cava is normal in size with greater than 50%  respiratory variability, suggesting right atrial pressure of 3 mmHg.    Echo 06/11/2017 Normal EF, elevated RVSP   Echocardiogram November 2018 Left ventricle: The cavity size was normal. There was moderate   concentric hypertrophy. Systolic function was normal. The   estimated ejection fraction was in the range of 55% to 60%. Wall   motion was normal; there were  no regional wall motion   abnormalities. Features are consistent with a pseudonormal left   ventricular filling pattern, with concomitant abnormal relaxation   and increased filling pressure (grade 2 diastolic dysfunction). - Aortic valve: There was trivial regurgitation. - Mitral valve: There was mild regurgitation. - Left atrium: The atrium was mildly dilated. - Right atrium: The atrium was moderately dilated. - Pulmonary arteries: Systolic pressure was moderately increased.   PA peak pressure: 48 mm Hg (S).  Patient Profile     71 y.o. male HTN, HLD, obesity, OSA not compliant with CPAP, RBBB, baseline bradycardia admitted with progressive fatigue and SB  Assessment & Plan    Symptomatic sinus bradycardia No AV block Baseline RBBB  HR with only minimal improvement now about 48 hours off clonidine. Marionna Gonia plan for PPM today Dr. Curt Bears discussed with patient/family at bedside, they are agreeable to proceed.  2. HTN Waxes/wanes here Clonidine historically reported as an allergy w/rash though was tolerating it well the last couple weeks from this perspective with reports of good BP control. We Kenzlee Fishburn resume post pacing A referral to the HTN clinic has been made. They Latrisha Coiro reach out to him to schedule  3. Untreated OSA 4. Obesity Long discussion today on our suspicion that his chronic fatigue, daytime sleepiness, and somnolence most likely is multifactorial Pacing perhaps Rayn Enderson help but urged to f/u with sleep specialist to find a therapy that he can tolerate, perhaps may need an allergist/ENT to help given his intolerance to therapy seems to revolve around his allergies/sinuses Perhaps a dental appliance  RA/RV enlarged, normal Pulm pressures     For questions or updates, please contact [place] Please consult [email or web address] for contact info under        Signed, [name], PA-C  02/27/2022, 8:28 AM    I have seen and examined this patient with Tommye Standard.   Agree with above, note added to reflect my findings.  He continues to have episodes of weakness and fatigue.  He is bradycardic.  We Suezette Lafave plan for pacemaker implant.  He does understand that his sleep apnea is playing a part.    GEN: Well nourished, well developed, in no acute distress  HEENT: normal  Neck: no JVD, carotid bruits, or masses Cardiac: RRR; no murmurs, rubs, or gallops,no edema  Respiratory:  clear to auscultation bilaterally, normal work of breathing GI: soft, nontender, nondistended, + BS MS:  no deformity or atrophy  Skin: warm and dry Neuro:  Strength and sensation are intact Psych: euthymic mood, full affect   Sick sinus syndrome: Patient continues to have episodes of bradycardia, weakness, fatigue.  We Deondria Puryear plan for pacemaker implant.  Risk and benefits were discussed with bleeding, infection, pneumothorax,  Jakolby Sedivy M. Yahmir Sokolov MD 02/27/2022 12:27 PM

## 2022-02-27 NOTE — Care Management (Signed)
  Transition of Care [facility]) Screening Note   Patient Details  Name: Randy Kennedy Date of Birth: 27-Jun-1951   Transition of Care [facility]) CM/SW Contact:    Bethena Roys, RN Phone Number: 02/27/2022, 1:03 PM    Transition of Care Department [facility]) has reviewed the patient and no TOC needs have been identified at this time. Case Manager will continue to monitor patient advancement through interdisciplinary progression rounds. If new patient transition needs arise, please place a TOC consult.

## 2022-02-27 NOTE — Interval H&P Note (Signed)
History and Physical Interval Note:  02/27/2022 12:28 PM  Randy Kennedy  has presented today for surgery, with the diagnosis of symptomatic brady cardia.  The various methods of treatment have been discussed with the patient and family. After consideration of risks, benefits and other options for treatment, the patient has consented to  Procedure(s): PACEMAKER IMPLANT (N/A) as a surgical intervention.  The patient's history has been reviewed, patient examined, no change in status, stable for surgery.  I have reviewed the patient's chart and labs.  Questions were answered to the patient's satisfaction.     Telesia Ates [employer]

## 2022-02-27 NOTE — Discharge Instructions (Signed)
    Supplemental Discharge Instructions for  Pacemaker/Defibrillator Patients    Activity No heavy lifting or vigorous activity with your left/right arm for 6 to 8 weeks.  Do not raise your left/right arm above your head for one week.  Gradually raise your affected arm as drawn below.             03/03/22                     03/04/22                      03/05/22                 03/06/22 __  NO DRIVING until cleared to at your wound check visit  Arnold the wound area clean and dry.  Do not get this area wet , no showers until cleared to at your wound check visit . The tape/steri-strips on your wound will fall off; do not pull them off.  No bandage is needed on the site.  DO  NOT apply any creams, oils, or ointments to the wound area. If you notice any drainage or discharge from the wound, any swelling or bruising at the site, or you develop a fever > 101? F after you are discharged home, call the office at once.  Special Instructions You are still able to use cellular telephones; use the ear opposite the side where you have your pacemaker/defibrillator.  Avoid carrying your cellular phone near your device. When traveling through airports, show security personnel your identification card to avoid being screened in the metal detectors.  Ask the security personnel to use the hand wand. Avoid arc welding equipment, MRI testing (magnetic resonance imaging), TENS units (transcutaneous nerve stimulators).  Call the office for questions about other devices. Avoid electrical appliances that are in poor condition or are not properly grounded. Microwave ovens are safe to be near or to operate.

## 2022-02-28 ENCOUNTER — Inpatient Hospital Stay (HOSPITAL_COMMUNITY): Payer: PPO

## 2022-02-28 DIAGNOSIS — I495 Sick sinus syndrome: Secondary | ICD-10-CM | POA: Diagnosis not present

## 2022-02-28 DIAGNOSIS — I10 Essential (primary) hypertension: Secondary | ICD-10-CM | POA: Diagnosis not present

## 2022-02-28 MED ORDER — CARVEDILOL 25 MG PO TABS
25.0000 mg | ORAL_TABLET | Freq: Two times a day (BID) | ORAL | Status: DC
  Administered 2022-02-28: 25 mg via ORAL
  Filled 2022-02-28: qty 1

## 2022-02-28 MED ORDER — CARVEDILOL 25 MG PO TABS: 25.0000 mg | ORAL_TABLET | Freq: Two times a day (BID) | ORAL | 0 refills | Status: DC

## 2022-02-28 NOTE — Discharge Summary (Signed)
ELECTROPHYSIOLOGY PROCEDURE DISCHARGE SUMMARY    Patient ID: Randy Kennedy,  MRN: [id number], DOB/AGE: 1950/12/27 71 y.o.  Admit date: 02/25/2022 Discharge date: 02/28/2022  Primary Care Physician: [name], MD  Primary Cardiologist: None  Electrophysiologist: Will Meredith Leeds, MD   Primary Discharge Diagnosis:  Symptomatic bradycardia status post pacemaker implantation this admission  Secondary Discharge Diagnosis:  Hypertension OSA Obesity  Allergies  Allergen Reactions   Amlodipine Rash    Rash in groin   Allopurinol Other (See Comments)    GI upset   Lisinopril-Hydrochlorothiazide     Burning in feet.   Procardia [Nifedipine] Other (See Comments)    Bradycardia, leg swelling   Spironolactone Other (See Comments)    malaise, dizziness   Clonidine Derivatives Rash   Doxazosin Mesylate Rash   Latex Rash   Nortriptyline Rash   Uloric [Febuxostat] Rash     Procedures This Admission:  1.  Implantation of a St. Jude Dual Chamber PPM on 02/27/22 by Dr. Curt Bears. The patient received a Abbott Assurity [id number] with a Abbott Tendril [id number] right atrial lead and a Abbott Tendril STS [id number] right ventricular lead. There were no immediate post procedure complications.   2.  CXR on 02/28/22 demonstrated no pneumothorax status post device implantation.    Brief HPI: Randy Kennedy is a 71 y.o. male was admitted for symptomatic bradycardia/SSS and electrophysiology team asked to see for consideration of PPM implantation.  Past medical history includes HTN, HLD, OSA, GERD.  The patient has had symptomatic bradycardia without reversible causes identified.  Risks, benefits, and alternatives to PPM implantation were reviewed with the patient who wished to proceed.   Hospital Course:  The patient was admitted and underwent implantation of a St. Jude dual chamber PPM with details as outlined above.  He was monitored on telemetry overnight which demonstrated  appropriate pacing.  Left chest was without hematoma or ecchymosis.  The device was interrogated and found to be functioning normally.  CXR was obtained and demonstrated no pneumothorax status post device implantation.  Wound care, arm mobility, and restrictions were reviewed with the patient.  The patient was examined and considered stable for discharge to home.    Anticoagulation resumption This patient is not on anticoagulation.   Physical Exam: Vitals:   02/27/22 2133 02/28/22 0043 02/28/22 0532 02/28/22 0906  BP: (!) 149/85 (!) 162/90  (!) 177/99  Pulse: 63 66 63   Resp: '19 16  17  '$ Temp: 97.8 F (36.6 C) 97.8 F (36.6 C)  97.8 F (36.6 C)  TempSrc: Oral Oral  Oral  SpO2: 96% 97% 96%   Weight:      Height:       Labs:   Lab Results  Component Value Date   WBC 6.1 02/26/2022   HGB 14.8 02/26/2022   HCT 42.0 02/26/2022   MCV 90.1 02/26/2022   PLT 136 (L) 02/26/2022    Recent Labs  Lab 02/26/22 0315  NA 139  K 4.2  CL 105  CO2 27  BUN 18  CREATININE 1.20  CALCIUM 8.9  GLUCOSE 102*    Discharge Medications:  Allergies as of 02/28/2022       Reactions   Amlodipine Rash   Rash in groin   Allopurinol Other (See Comments)   GI upset   Lisinopril-hydrochlorothiazide    Burning in feet.   Procardia [nifedipine] Other (See Comments)   Bradycardia, leg swelling   Spironolactone Other (See Comments)   malaise, dizziness  Clonidine Derivatives Rash   Doxazosin Mesylate Rash   Latex Rash   Nortriptyline Rash   Uloric [febuxostat] Rash        Medication List     STOP taking these medications    cloNIDine 0.1 MG tablet Commonly known as: Catapres       TAKE these medications    ascorbic acid 500 MG tablet Commonly known as: VITAMIN C Take 500 mg by mouth daily.   aspirin EC 81 MG tablet Take 81 mg by mouth daily.   carvedilol 25 MG tablet Commonly known as: COREG Take 1 tablet (25 mg total) by mouth 2 (two) times daily with a meal.    colchicine 0.6 MG tablet TAKE ONE TABLET BY MOUTH ONCE DAILY   fexofenadine 180 MG tablet Commonly known as: ALLEGRA Take 180 mg by mouth daily.   furosemide 20 MG tablet Commonly known as: LASIX Take 1 tablet (20 mg total) by mouth 3 (three) times a week.   gabapentin 300 MG capsule Commonly known as: NEURONTIN TAKE ONE CAPSULE BY MOUTH TWICE DAILY and TAKE TWO CAPSULES EVERYDAY AT BEDTIME What changed: See the new instructions.   hydrALAZINE 100 MG tablet Commonly known as: APRESOLINE TAKE 1 AND 1/2 TABLETS BY MOUTH THREE TIMES DAILY, MAY increase TO FOUR TIMES DAILY AS NEEDED FOR HIGH BLOOD PRESSURE What changed:  how much to take how to take this when to take this   HYDROcodone-acetaminophen 10-325 MG tablet Commonly known as: NORCO TAKE ONE TABLET BY MOUTH every SIX hours AS NEEDED What changed: reasons to take this   meloxicam 15 MG tablet Commonly known as: MOBIC TAKE 1 TABLET BY MOUTH ONCE DAILY AS NEEDED FOR  GOUT  FLARE What changed: See the new instructions.   nitroGLYCERIN 0.4 MG SL tablet Commonly known as: NITROSTAT Place 1 tablet (0.4 mg total) under the tongue every 5 (five) minutes as needed for chest pain.   pravastatin 40 MG tablet Commonly known as: PRAVACHOL TAKE ONE TABLET BY MOUTH ONCE DAILY   predniSONE 20 MG tablet Commonly known as: DELTASONE Take 1 tablet (20 mg total) by mouth daily. For 1-2 days prn gout flare   probenecid 500 MG tablet Commonly known as: BENEMID TAKE 1/2 TABLET BY MOUTH TWICE DAILY What changed: additional instructions   valsartan 320 MG tablet Commonly known as: DIOVAN TAKE ONE TABLET BY MOUTH ONCE DAILY               Discharge Care Instructions  (From admission, onward)           Start     Ordered   02/28/22 0000  Leave dressing on - Keep it clean, dry, and intact until clinic visit        02/28/22 1027            Disposition:  Discharge Instructions     Diet - low sodium heart  healthy   Complete by: As directed    Increase activity slowly   Complete by: As directed    Leave dressing on - Keep it clean, dry, and intact until clinic visit   Complete by: As directed        [place] [facility] Follow up on 03/12/2022.   Specialty: Cardiology Why: at 10:40am for your follow up device check Contact information: [address] [place]        Constance Haw, MD Follow  up on 06/03/2022.   Specialty: Cardiology Why: at 3pm for your follow up Contact information: [place] [address] [phone]                 Duration of Discharge Encounter: Greater than 30 minutes including physician time.  Signed, Reino Bellis, NP  02/28/2022 10:37 AM

## 2022-02-28 NOTE — Progress Notes (Signed)
Progress Note  Patient Name: Randy Kennedy Date of Encounter: 02/28/2022  [facility] Cardiologist: None   Subjective   NAEO.   Inpatient Medications    Scheduled Meds:  aspirin EC  81 mg Oral Daily   carvedilol  12.5 mg Oral BID WC   furosemide  20 mg Oral Once per day on Mon Wed Fri   gabapentin  300 mg Oral BID   hydrALAZINE  100 mg Oral Q8H   irbesartan  300 mg Oral Daily   loratadine  10 mg Oral Daily   pravastatin  40 mg Oral Daily   sodium chloride flush  3 mL Intravenous Q12H   Continuous Infusions:  PRN Meds: acetaminophen, HYDROcodone-acetaminophen, ondansetron (ZOFRAN) IV   Vital Signs    Vitals:   02/27/22 2133 02/28/22 0043 02/28/22 0532 02/28/22 0906  BP: (!) 149/85 (!) 162/90  (!) 177/99  Pulse: 63 66 63   Resp: '19 16  17  '$ Temp: 97.8 F (36.6 C) 97.8 F (36.6 C)  97.8 F (36.6 C)  TempSrc: Oral Oral  Oral  SpO2: 96% 97% 96%   Weight:      Height:        Intake/Output Summary (Last 24 hours) at 02/28/2022 0910 Last data filed at 02/27/2022 1900 Gross per 24 hour  Intake 50 ml  Output --  Net 50 ml      02/25/2022    8:18 PM 02/25/2022   12:30 PM 12/29/2021    8:41 AM  Last 3 Weights  Weight (lbs) 231 lb 11.2 oz 215 lb 232 lb 4 oz  Weight (kg) 105.098 kg 97.523 kg 105.348 kg      Telemetry    Personally Reviewed  ECG    Personally Reviewed  Physical Exam   GEN: No acute distress.   Neck: No JVD Cardiac: RRR, no murmurs, rubs, or gallops. PPM pocket without hematoma or significant pain Respiratory: Clear to auscultation bilaterally. GI: Soft, nontender, non-distended  MS: No edema; No deformity. Neuro:  Nonfocal  Psych: Normal affect   Labs    High Sensitivity Troponin:   Recent Labs  Lab 02/25/22 1245 02/25/22 1741  TROPONINIHS 11 12     Chemistry Recent Labs  Lab 02/25/22 1245 02/25/22 2037 02/26/22 0315  NA 139  --  139  K 3.9  --  4.2  CL 106  --  105  CO2 26  --  27  GLUCOSE 104*  --  102*  BUN 18   --  18  CREATININE 1.23 1.20 1.20  CALCIUM 9.1  --  8.9  GFRNONAA >60 >60 >60  ANIONGAP 7  --  7    Lipids No results for input(s): "CHOL", "TRIG", "HDL", "LABVLDL", "LDLCALC", "CHOLHDL" in the last 168 hours.  Hematology Recent Labs  Lab 02/25/22 1245 02/25/22 2037 02/26/22 0315  WBC 7.1 6.7 6.1  RBC 4.71 4.86 4.66  HGB 14.9 15.3 14.8  HCT 42.2 43.6 42.0  MCV 89.6 89.7 90.1  MCH 31.6 31.5 31.8  MCHC 35.3 35.1 35.2  RDW 12.1 12.4 12.2  PLT 141* 139* 136*   Thyroid No results for input(s): "TSH", "FREET4" in the last 168 hours.  BNPNo results for input(s): "BNP", "PROBNP" in the last 168 hours.  DDimer No results for input(s): "DDIMER" in the last 168 hours.   Radiology    EP PPM/ICD IMPLANT  Result Date: 02/27/2022 SURGEON:  Allegra Lai, MD   PREPROCEDURE DIAGNOSIS:  sick sinus syndrome   POSTPROCEDURE  DIAGNOSIS:  sick sinus syndrome    PROCEDURES:  1. Pacemaker implantation.   INTRODUCTION:  Randy Kennedy is a 71 y.o. male with a history of bradycardia who presents today for pacemaker implantation.  The patient reports intermittent episodes of dizziness over the past few months.  No reversible causes have been identified.  The patient therefore presents today for pacemaker implantation.   DESCRIPTION OF PROCEDURE:  Informed written consent was obtained, and  the patient was brought to the electrophysiology lab in a fasting state.  The patient required no sedation for the procedure today.  The patients left chest was prepped and draped in the usual sterile fashion by the EP lab staff. The skin overlying the left deltopectoral region was infiltrated with lidocaine for local analgesia.  A 4-cm incision was made over the left deltopectoral region.  A left subcutaneous pacemaker pocket was fashioned using a combination of sharp and blunt dissection. Electrocautery was required to assure hemostasis.  RA/RV Lead Placement: The left axillary vein was therefore cannulated.  Through the  left axillary vein, a Abbott Tendril [id number]  (serial number  [id number]) right atrial lead and an Abbott Tendril STS [id number] (serial number  [id number]) right ventricular lead were advanced with fluoroscopic visualization into the right atrial appendage and right ventricular apex positions respectively.  Initial atrial lead P- waves measured 2 mV with impedance of 521 ohms and a threshold of 0.9 V at 0.5 msec.  Right ventricular lead R-waves measured 9.7 mV with an impedance of 649 ohms and a threshold of 0.9 V at 0.5 msec.  Both leads were secured to the pectoralis fascia using #2-0 silk over the suture sleeves. Device Placement:  The leads were then connected to an [place]  (serial number  [id number] ) pacemaker.  The pocket was irrigated with copious gentamicin solution.  The pacemaker was then placed into the pocket.  The pocket was then closed in 3 layers with 2.0 Vicryl suture for the 3.0 Vicryl suture subcutaneous and subcuticular layers.  Steri-  Strips and a sterile dressing were then applied. EBL<7m.  There were no early apparent complications.   CONCLUSIONS:  1. Successful implantation of a [employer] Jude Medical Assurity MRI dual-chamber pacemaker for symptomatic bradycardia  2. No early apparent complications.       Will CCurt Bears MD 02/27/2022 1:41 PM  ECHOCARDIOGRAM COMPLETE  Result Date: 02/26/2022    ECHOCARDIOGRAM REPORT   Patient Name:   Randy STUCKEDate of Exam: 02/26/2022 Medical Rec #:  [id number]         Height:       67.0 in Accession #:    [id number]        Weight:       231.7 lb Date of Birth:  [birth year]         BSA:          2.153 m Patient Age:    89years           BP:           164/76 mmHg Patient Gender: M                  HR:           39 bpm. Exam Location:  Inpatient Procedure: 2D Echo, Cardiac Doppler and Color Doppler Indications:    R94.31 Abnormal EKG  History:        Patient has prior history of Echocardiogram examinations, most  recent 06/11/2017.  Signs/Symptoms:Chest Pain and Dyspnea; Risk                 Factors:Sleep Apnea, Hypertension and Dyslipidemia.  Sonographer:    Bernadene Person RDCS Referring Phys: [id number] Margie Billet IMPRESSIONS  1. Left ventricular ejection fraction, by estimation, is 60 to 65%. The left ventricle has normal function. The left ventricle has no regional wall motion abnormalities. Left ventricular diastolic parameters are consistent with Grade II diastolic dysfunction (pseudonormalization).  2. Right ventricular systolic function is normal. The right ventricular size is moderately enlarged. There is normal pulmonary artery systolic pressure.  3. Left atrial size was moderately dilated.  4. Right atrial size was severely dilated.  5. The mitral valve is normal in structure. No evidence of mitral valve regurgitation. No evidence of mitral stenosis.  6. The aortic valve is tricuspid. Aortic valve regurgitation is trivial. Aortic valve sclerosis/calcification is present, without any evidence of aortic stenosis.  7. The inferior vena cava is normal in size with greater than 50% respiratory variability, suggesting right atrial pressure of 3 mmHg. FINDINGS  Left Ventricle: Left ventricular ejection fraction, by estimation, is 60 to 65%. The left ventricle has normal function. The left ventricle has no regional wall motion abnormalities. The left ventricular internal cavity size was normal in size. There is  no left ventricular hypertrophy. Left ventricular diastolic parameters are consistent with Grade II diastolic dysfunction (pseudonormalization). Right Ventricle: The right ventricular size is moderately enlarged. No increase in right ventricular wall thickness. Right ventricular systolic function is normal. There is normal pulmonary artery systolic pressure. The tricuspid regurgitant velocity is 2.70 m/s, and with an assumed right atrial pressure of 3 mmHg, the estimated right ventricular systolic pressure is 34.1 mmHg. Left Atrium: Left  atrial size was moderately dilated. Right Atrium: Right atrial size was severely dilated. Pericardium: There is no evidence of pericardial effusion. Presence of epicardial fat layer. Mitral Valve: The mitral valve is normal in structure. No evidence of mitral valve regurgitation. No evidence of mitral valve stenosis. Tricuspid Valve: The tricuspid valve is normal in structure. Tricuspid valve regurgitation is mild . No evidence of tricuspid stenosis. Aortic Valve: The aortic valve is tricuspid. Aortic valve regurgitation is trivial. Aortic valve sclerosis/calcification is present, without any evidence of aortic stenosis. Pulmonic Valve: The pulmonic valve was not well visualized. Pulmonic valve regurgitation is mild. No evidence of pulmonic stenosis. Aorta: The aortic root is normal in size and structure. Venous: The inferior vena cava is normal in size with greater than 50% respiratory variability, suggesting right atrial pressure of 3 mmHg. IAS/Shunts: No atrial level shunt detected by color flow Doppler.  LEFT VENTRICLE PLAX 2D LVIDd:         5.70 cm      Diastology LVIDs:         3.30 cm      LV e' medial:    7.89 cm/s LV PW:         1.20 cm      LV E/e' medial:  11.2 LV IVS:        1.30 cm      LV e' lateral:   10.00 cm/s LVOT diam:     2.10 cm      LV E/e' lateral: 8.8 LV SV:         115 LV SV Index:   54 LVOT Area:     3.46 cm  LV Volumes (MOD) LV vol d, MOD A2C: 136.0 ml LV vol  d, MOD A4C: 127.0 ml LV vol s, MOD A2C: 41.8 ml LV vol s, MOD A4C: 45.8 ml LV SV MOD A2C:     94.2 ml LV SV MOD A4C:     127.0 ml LV SV MOD BP:      86.1 ml RIGHT VENTRICLE RV S prime:     16.30 cm/s TAPSE (M-mode): 2.7 cm LEFT ATRIUM              Index        RIGHT ATRIUM           Index LA diam:        5.80 cm  2.69 cm/m   RA Area:     26.50 cm LA Vol (A2C):   73.9 ml  34.33 ml/m  RA Volume:   91.80 ml  42.64 ml/m LA Vol (A4C):   110.0 ml 51.10 ml/m LA Biplane Vol: 98.5 ml  45.76 ml/m  AORTIC VALVE             PULMONIC VALVE  LVOT Vmax:   133.00 cm/s PR End Diast Vel: 5.29 msec LVOT Vmean:  83.900 cm/s LVOT VTI:    0.333 m  AORTA Ao Root diam: 3.10 cm Ao Asc diam:  3.60 cm MITRAL VALVE               TRICUSPID VALVE MV Area (PHT): 2.08 cm    TR Peak grad:   29.2 mmHg MV Decel Time: 364 msec    TR Vmax:        270.00 cm/s MV E velocity: 88.30 cm/s MV A velocity: 92.60 cm/s  SHUNTS MV E/A ratio:  0.95        Systemic VTI:  0.33 m                            Systemic Diam: 2.10 cm Kardie Tobb DO Electronically signed by Berniece Salines DO Signature Date/Time: 02/26/2022/11:33:35 AM    Final       Assessment & Plan    71 y.o. male HTN, HLD, obesity, OSA not compliant with CPAP, RBBB, baseline bradycardia admitted with progressive fatigue and SB. 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  #Symptomatic sinus node dysfunction DDD PPM 02/27/2022. Device interrogation this AM shows stable lead parameters. CXR with stable lead position.  #HTN Above goal. Increase coreg to '25mg'$  PO BID. Recommend close monitoring of BP at home 1-2 times per week. Please bring these values to your primary care physician for further medication regimen titration.  #OSA #Obesity  OK to discharge.  For questions or updates, please contact [place] Please consult [email or web address] for contact info under        Signed, Vickie Epley, MD  02/28/2022, 9:10 AM

## 2022-03-02 ENCOUNTER — Encounter (HOSPITAL_COMMUNITY): Payer: Self-pay | Admitting: Cardiology

## 2022-03-06 ENCOUNTER — Telehealth: Payer: Self-pay | Admitting: Cardiology

## 2022-03-06 NOTE — Telephone Encounter (Signed)
   Pt said, his back across from where his pacemaker been hurting, sometimes it worst sometimes its tolerable, pt said, he took some tylenol but no relief. He wanted to know what he needs to do if he needs to get a sooner appt to see the doctor as well

## 2022-03-06 NOTE — Telephone Encounter (Signed)
Spoke with patient, informed patient to try ice over area instead of heat as well as alternating Tylenol and Ibuprofen over the weekend and to call back Monday if the pain has not subsided some. Patient voiced understanding

## 2022-03-11 ENCOUNTER — Other Ambulatory Visit: Payer: Self-pay | Admitting: Family Medicine

## 2022-03-12 ENCOUNTER — Ambulatory Visit (INDEPENDENT_AMBULATORY_CARE_PROVIDER_SITE_OTHER): Payer: PPO

## 2022-03-12 DIAGNOSIS — I495 Sick sinus syndrome: Secondary | ICD-10-CM | POA: Diagnosis not present

## 2022-03-12 LAB — CUP PACEART INCLINIC DEVICE CHECK
Battery Remaining Longevity: 128 mo
Battery Voltage: 3.11 V
Brady Statistic RA Percent Paced: 99.4 %
Brady Statistic RV Percent Paced: 1.6 %
Date Time Interrogation Session: 20230817124425
Implantable Lead Implant Date: 20230804
Implantable Lead Implant Date: 20230804
Implantable Lead Location: 753859
Implantable Lead Location: 753860
Implantable Pulse Generator Implant Date: 20230804
Lead Channel Impedance Value: 475 Ohm
Lead Channel Impedance Value: 537.5 Ohm
Lead Channel Pacing Threshold Amplitude: 0.75 V
Lead Channel Pacing Threshold Amplitude: 1 V
Lead Channel Pacing Threshold Pulse Width: 0.5 ms
Lead Channel Pacing Threshold Pulse Width: 0.5 ms
Lead Channel Sensing Intrinsic Amplitude: 12 mV
Lead Channel Sensing Intrinsic Amplitude: 2.8 mV
Lead Channel Setting Pacing Amplitude: 1.25 V
Lead Channel Setting Pacing Amplitude: 1.625
Lead Channel Setting Pacing Pulse Width: 0.5 ms
Lead Channel Setting Sensing Sensitivity: 2 mV
Pulse Gen Model: 2272
Pulse Gen Serial Number: 8101269

## 2022-03-12 NOTE — Progress Notes (Signed)

## 2022-03-12 NOTE — Patient Instructions (Signed)
   After Your Pacemaker   Monitor your pacemaker site for redness, swelling, and drainage. Call the device clinic at [phone] if you experience these symptoms or fever/chills.  Your incision was closed with Steri-strips or staples:  You may shower 7 days after your procedure and wash your incision with soap and water. Avoid lotions, ointments, or perfumes over your incision until it is well-healed.   Do not lift, push or pull greater than 10 pounds with the affected arm April 11 2022  There are no other restrictions in arm movement after your wound check appointment.  You may drive, unless driving has been restricted by your healthcare providers.   Remote monitoring is used to monitor your pacemaker from home. This monitoring is scheduled every 91 days by our office. It allows [place] to keep an eye on the functioning of your device to ensure it is working properly. You will routinely see your Electrophysiologist annually (more often if necessary).

## 2022-03-12 NOTE — Telephone Encounter (Signed)
Name of Medication: Hydrocodone-APAP Name of Pharmacy: [facility] Last Fill or Written Date and Quantity: 02/13/22, #90 Last Office Visit and Type: 12/16/21, chronic pain mgmt Next Office Visit and Type: 03/18/22, CPE Last Controlled Substance Agreement Date: 09/29/16 Last UDS: 03/10/19

## 2022-03-13 NOTE — Telephone Encounter (Signed)
ERx 

## 2022-03-18 ENCOUNTER — Encounter: Payer: Self-pay | Admitting: Family Medicine

## 2022-03-18 ENCOUNTER — Ambulatory Visit (INDEPENDENT_AMBULATORY_CARE_PROVIDER_SITE_OTHER): Payer: PPO | Admitting: Family Medicine

## 2022-03-18 VITALS — BP 154/84 | HR 71 | Temp 97.7°F | Ht 65.25 in | Wt 233.1 lb

## 2022-03-18 DIAGNOSIS — R0981 Nasal congestion: Secondary | ICD-10-CM | POA: Diagnosis not present

## 2022-03-18 DIAGNOSIS — G8929 Other chronic pain: Secondary | ICD-10-CM | POA: Diagnosis not present

## 2022-03-18 DIAGNOSIS — Z0000 Encounter for general adult medical examination without abnormal findings: Secondary | ICD-10-CM | POA: Diagnosis not present

## 2022-03-18 DIAGNOSIS — R001 Bradycardia, unspecified: Secondary | ICD-10-CM

## 2022-03-18 DIAGNOSIS — E782 Mixed hyperlipidemia: Secondary | ICD-10-CM | POA: Diagnosis not present

## 2022-03-18 DIAGNOSIS — I10 Essential (primary) hypertension: Secondary | ICD-10-CM | POA: Diagnosis not present

## 2022-03-18 DIAGNOSIS — K219 Gastro-esophageal reflux disease without esophagitis: Secondary | ICD-10-CM | POA: Diagnosis not present

## 2022-03-18 DIAGNOSIS — M1A09X0 Idiopathic chronic gout, multiple sites, without tophus (tophi): Secondary | ICD-10-CM

## 2022-03-18 DIAGNOSIS — D696 Thrombocytopenia, unspecified: Secondary | ICD-10-CM | POA: Diagnosis not present

## 2022-03-18 DIAGNOSIS — Z125 Encounter for screening for malignant neoplasm of prostate: Secondary | ICD-10-CM | POA: Diagnosis not present

## 2022-03-18 DIAGNOSIS — G4733 Obstructive sleep apnea (adult) (pediatric): Secondary | ICD-10-CM

## 2022-03-18 DIAGNOSIS — J302 Other seasonal allergic rhinitis: Secondary | ICD-10-CM

## 2022-03-18 DIAGNOSIS — Z7189 Other specified counseling: Secondary | ICD-10-CM | POA: Diagnosis not present

## 2022-03-18 DIAGNOSIS — Z950 Presence of cardiac pacemaker: Secondary | ICD-10-CM

## 2022-03-18 LAB — CBC WITH DIFFERENTIAL/PLATELET
Basophils Absolute: 0.1 10*3/uL (ref 0.0–0.1)
Basophils Relative: 1.1 % (ref 0.0–3.0)
Eosinophils Absolute: 0.3 10*3/uL (ref 0.0–0.7)
Eosinophils Relative: 4.4 % (ref 0.0–5.0)
HCT: 42 % (ref 39.0–52.0)
Hemoglobin: 14.4 g/dL (ref 13.0–17.0)
Lymphocytes Relative: 29.1 % (ref 12.0–46.0)
Lymphs Abs: 2 10*3/uL (ref 0.7–4.0)
MCHC: 34.2 g/dL (ref 30.0–36.0)
MCV: 90.6 fl (ref 78.0–100.0)
Monocytes Absolute: 0.7 10*3/uL (ref 0.1–1.0)
Monocytes Relative: 10 % (ref 3.0–12.0)
Neutro Abs: 3.8 10*3/uL (ref 1.4–7.7)
Neutrophils Relative %: 55.4 % (ref 43.0–77.0)
Platelets: 154 10*3/uL (ref 150.0–400.0)
RBC: 4.64 Mil/uL (ref 4.22–5.81)
RDW: 12.6 % (ref 11.5–15.5)
WBC: 6.9 10*3/uL (ref 4.0–10.5)

## 2022-03-18 LAB — COMPREHENSIVE METABOLIC PANEL
ALT: 25 U/L (ref 0–53)
AST: 22 U/L (ref 0–37)
Albumin: 4.4 g/dL (ref 3.5–5.2)
Alkaline Phosphatase: 69 U/L (ref 39–117)
BUN: 23 mg/dL (ref 6–23)
CO2: 28 mEq/L (ref 19–32)
Calcium: 9.1 mg/dL (ref 8.4–10.5)
Chloride: 99 mEq/L (ref 96–112)
Creatinine, Ser: 1.18 mg/dL (ref 0.40–1.50)
GFR: 62.18 mL/min (ref >60.00)
Glucose, Bld: 96 mg/dL (ref 70–99)
Potassium: 4 mEq/L (ref 3.5–5.1)
Sodium: 139 mEq/L (ref 135–145)
Total Bilirubin: 0.9 mg/dL (ref 0.2–1.2)
Total Protein: 7.1 g/dL (ref 6.0–8.3)

## 2022-03-18 LAB — LIPID PANEL
Cholesterol: 180 mg/dL (ref 0–200)
HDL: 62 mg/dL (ref >39.00)
NonHDL: 118.33
Total CHOL/HDL Ratio: 3
Triglycerides: 337 mg/dL — ABNORMAL HIGH (ref 0.0–149.0)
VLDL: 67.4 mg/dL — ABNORMAL HIGH (ref 0.0–40.0)

## 2022-03-18 LAB — PSA: PSA: 0.43 ng/mL (ref 0.10–4.00)

## 2022-03-18 LAB — LDL CHOLESTEROL, DIRECT: Direct LDL: 83 mg/dL

## 2022-03-18 LAB — URIC ACID: Uric Acid, Serum: 6.9 mg/dL (ref 4.0–7.8)

## 2022-03-18 MED ORDER — COLCHICINE 0.6 MG PO TABS
0.6000 mg | ORAL_TABLET | Freq: Every day | ORAL | 0 refills | Status: DC
Start: 2022-03-18 — End: 2022-07-15

## 2022-03-18 MED ORDER — HYDRALAZINE HCL 100 MG PO TABS
ORAL_TABLET | ORAL | 11 refills | Status: DC
Start: 2022-03-18 — End: 2022-07-30

## 2022-03-18 MED ORDER — FLUTICASONE PROPIONATE 50 MCG/ACT NA SUSP: 2.0000 | Freq: Every day | NASAL | 11 refills | Status: DC

## 2022-03-18 MED ORDER — VALSARTAN 320 MG PO TABS
320.0000 mg | ORAL_TABLET | Freq: Every day | ORAL | 3 refills | Status: DC
Start: 2022-03-18 — End: 2022-07-30

## 2022-03-18 NOTE — Patient Instructions (Addendum)
We don't have high dose flu shot yet.  Try flonase nasal steroid 2 squirt into each nostril daily. May take with nasal saline irrigation.  Labs today.  If interested, check with pharmacy about new 2 shot shingles series (shingrix).  Return in 3 months for chronic pain follow up visit. Good to see you today  Health Maintenance After Age 71 After age 15, you are at a higher risk for certain long-term diseases and infections as well as injuries from falls. Falls are a major cause of broken bones and head injuries in people who are older than age 16. Getting regular preventive care can help to keep you healthy and well. Preventive care includes getting regular testing and making lifestyle changes as recommended by your health care provider. Talk with your health care provider about: Which screenings and tests you should have. A screening is a test that checks for a disease when you have no symptoms. A diet and exercise plan that is right for you. What should I know about screenings and tests to prevent falls? Screening and testing are the best ways to find a health problem early. Early diagnosis and treatment give you the best chance of managing medical conditions that are common after age 56. Certain conditions and lifestyle choices may make you more likely to have a fall. Your health care provider may recommend: Regular vision checks. Poor vision and conditions such as cataracts can make you more likely to have a fall. If you wear glasses, make sure to get your prescription updated if your vision changes. Medicine review. Work with your health care provider to regularly review all of the medicines you are taking, including over-the-counter medicines. Ask your health care provider about any side effects that may make you more likely to have a fall. Tell your health care provider if any medicines that you take make you feel dizzy or sleepy. Strength and balance checks. Your health care provider may  recommend certain tests to check your strength and balance while standing, walking, or changing positions. Foot health exam. Foot pain and numbness, as well as not wearing proper footwear, can make you more likely to have a fall. Screenings, including: Osteoporosis screening. Osteoporosis is a condition that causes the bones to get weaker and break more easily. Blood pressure screening. Blood pressure changes and medicines to control blood pressure can make you feel dizzy. Depression screening. You may be more likely to have a fall if you have a fear of falling, feel depressed, or feel unable to do activities that you used to do. Alcohol use screening. Using too much alcohol can affect your balance and may make you more likely to have a fall. Follow these instructions at home: Lifestyle Do not drink alcohol if: Your health care provider tells you not to drink. If you drink alcohol: Limit how much you have to: 0-1 drink a day for women. 0-2 drinks a day for men. Know how much alcohol is in your drink. In the [place] one drink equals one 12 oz bottle of beer (355 mL), one 5 oz glass of wine (148 mL), or one 1 oz glass of hard liquor (44 mL). Do not use any products that contain nicotine or tobacco. These products include cigarettes, chewing tobacco, and vaping devices, such as e-cigarettes. If you need help quitting, ask your health care provider. Activity  Follow a regular exercise program to stay fit. This will help you maintain your balance. Ask your health care provider what types of  exercise are appropriate for you. If you need a cane or walker, use it as recommended by your health care provider. Wear supportive shoes that have nonskid soles. Safety  Remove any tripping hazards, such as rugs, cords, and clutter. Install safety equipment such as grab bars in bathrooms and safety rails on stairs. Keep rooms and walkways well-lit. General instructions Talk with your health care provider  about your risks for falling. Tell your health care provider if: You fall. Be sure to tell your health care provider about all falls, even ones that seem minor. You feel dizzy, tiredness (fatigue), or off-balance. Take over-the-counter and prescription medicines only as told by your health care provider. These include supplements. Eat a healthy diet and maintain a healthy weight. A healthy diet includes low-fat dairy products, low-fat (lean) meats, and fiber from whole grains, beans, and lots of fruits and vegetables. Stay current with your vaccines. Schedule regular health, dental, and eye exams. Summary Having a healthy lifestyle and getting preventive care can help to protect your health and wellness after age 97. Screening and testing are the best way to find a health problem early and help you avoid having a fall. Early diagnosis and treatment give you the best chance for managing medical conditions that are more common for people who are older than age 27. Falls are a major cause of broken bones and head injuries in people who are older than age 29. Take precautions to prevent a fall at home. Work with your health care provider to learn what changes you can make to improve your health and wellness and to prevent falls. This information is not intended to replace advice given to you by your health care provider. Make sure you discuss any questions you have with your health care provider. Document Revised: 12/02/2020 Document Reviewed: 12/02/2020 Elsevier Patient Education  [place].

## 2022-03-18 NOTE — Assessment & Plan Note (Signed)
Advanced directive discussion - has living will at home. Wife is Occupational psychologist. Asked to bring copy. full code attempt but wouldn't want prolonged life support.

## 2022-03-18 NOTE — Assessment & Plan Note (Signed)
Activity limited by pain.  Continue to encourage healthy diet changes to affect sustainable weight loss.

## 2022-03-18 NOTE — Assessment & Plan Note (Addendum)
Chronic, difficult to control blood pressure. Now off doxazosin, on full dose carvedilol. BP remains mildly elevated. Advised cards f/u for further management. Next cardiology and EP appt 05/2022.

## 2022-03-18 NOTE — Assessment & Plan Note (Signed)
Update CBC today. 

## 2022-03-18 NOTE — Progress Notes (Signed)
Patient ID: Randy Kennedy, male    DOB: 06-23-1951, 71 y.o.   MRN: [id number]  This visit was conducted in person.  BP (!) 154/84   Pulse 71   Temp 97.7 F (36.5 C) (Temporal)   Ht 5' 5.25" (1.657 m)   Wt 233 lb 2 oz (105.7 kg)   SpO2 97%   BMI 38.50 kg/m    CC: CPE Subjective:   HPI: Randy Kennedy is a 71 y.o. male presenting on 03/18/2022 for Annual Exam [facility] prt 2. )    Saw health advisor 09/2021 for medicare wellness visit. Note reviewed. Cognitive assessment not done.   No results found.  Flowsheet Row Clinical Support from 10/02/2021 in [facility] [place]  PHQ-2 Total Score 0          10/02/2021    1:23 PM 10/28/2020    9:02 AM 10/01/2020    1:10 PM 08/23/2019    9:02 AM 07/01/2018    9:22 AM  Fall Risk   Falls in the past year? 0 0 0 0 0  Number falls in past yr: 0  0 0   Injury with Fall? 0  0 0   Risk for fall due to : No Fall Risks  Medication side effect Medication side effect;Impaired balance/gait;Impaired mobility   Follow up Falls prevention discussed  Falls evaluation completed;Falls prevention discussed Falls evaluation completed;Falls prevention discussed    Recent hospitalization for symptomatic bradycardia s/p pacemaker insertion earlier this month.   Chronic back and leg pain - has seen PM&R Alba Destine) s/p L L5/S1 TFESI 09/2020 with 50% improvement, L L4/5 TFESI 10/2020 without benefit. No further lumbar ESI recommended. Continues gabapentin 300/300/'600mg'$  daily. Continues hydrocodone 10/'325mg'$  1 tab TID prn #90/mo. Also has known cervicalgia with L sided cervical radiculitis. States he's interested in cutting down on opiate use - he's currently using PRN, some days doesn't take at all, other days takes 3-4 tablets per day depending on pain.    OSA - can't tolerate CPAP/BiPAP masks.   Difficult to control HTN - followed by cardiology, on hydralazine '150mg'$  TID, valsartan '320mg'$  daily, lasix '20mg'$  3x/wk (known pulm HTN). States doxazosin  was stopped, now on carvedilol '25mg'$  bid.   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    Not fasting today.   Preventative: COLONOSCOPY WITH PROPOFOL 12/17/2020 - multiple TA, ext hem, rpt 3 yrs (Vanga, Tally Due, MD) Prostate cancer screening - rec screening given fmhx (father and uncle).  Lung cancer screening - doesn't qualify  Flu shot yearly COVID vaccine J&J 10/2019, no booster Td - 2014 Prevnar-13 2018, pneumovax 06/2018  Shingles shot - 04/2013  Shingrix - discussed  Advanced directive discussion - has living will at home. Wife is Occupational psychologist. Asked to bring copy. Full code attempt but wouldn't want prolonged life support.  Seat belt use discussed Sunscreen use and skin screen discussed. No changing moles on skin.  Ex smoker quit 1980 Alcohol - rare Dentist - every 1-2 years  Eye exam yearly - due Bowel - no constipation  Bladder - no incontinence    Caffeine: 2 cans coke/day Lives with wife and grown son   Occupation: retired, was Agricultural engineer.    On disability for chronic pain Edu: MBA Activity: no regular exercise due to pain Deit: good water, fruits/vegetables daily     Relevant past medical, surgical, family and social history reviewed and updated as indicated. Interim medical history since our last visit  reviewed. Allergies and medications reviewed and updated. Outpatient Medications Prior to Visit  Medication Sig Dispense Refill   aspirin EC 81 MG tablet Take 81 mg by mouth daily.     carvedilol (COREG) 25 MG tablet Take 1 tablet (25 mg total) by mouth 2 (two) times daily with a meal. 180 tablet 0   furosemide (LASIX) 20 MG tablet Take 1 tablet (20 mg total) by mouth 3 (three) times a week. 39 tablet 3   gabapentin (NEURONTIN) 300 MG capsule TAKE ONE CAPSULE BY MOUTH TWICE DAILY and TAKE TWO CAPSULES EVERYDAY AT BEDTIME (Patient taking differently: See admin instructions. Take one capsule by mouth twice daily and take two capsules everyday at bedtime)  360 capsule 3   HYDROcodone-acetaminophen (NORCO) 10-325 MG tablet TAKE ONE TABLET BY MOUTH every SIX hours AS NEEDED 90 tablet 0   meloxicam (MOBIC) 15 MG tablet TAKE 1 TABLET BY MOUTH ONCE DAILY AS NEEDED FOR  GOUT  FLARE (Patient taking differently: Take 15 mg by mouth daily as needed (gout flare).) 30 tablet 0   nitroGLYCERIN (NITROSTAT) 0.4 MG SL tablet Place 1 tablet (0.4 mg total) under the tongue every 5 (five) minutes as needed for chest pain. 25 tablet 3   pravastatin (PRAVACHOL) 40 MG tablet TAKE ONE TABLET BY MOUTH ONCE DAILY (Patient taking differently: Take 40 mg by mouth daily.) 90 tablet 3   probenecid (BENEMID) 500 MG tablet TAKE 1/2 TABLET BY MOUTH TWICE DAILY (Patient taking differently: Take 250 mg by mouth 2 (two) times daily. TAKE 1/2 TABLET BY MOUTH TWICE DAILY) 90 tablet 3   vitamin C (ASCORBIC ACID) 500 MG tablet Take 500 mg by mouth daily.     colchicine 0.6 MG tablet TAKE ONE TABLET BY MOUTH ONCE DAILY (Patient taking differently: Take 0.6 mg by mouth daily.) 90 tablet 0   hydrALAZINE (APRESOLINE) 100 MG tablet TAKE 1 AND 1/2 TABLETS BY MOUTH THREE TIMES DAILY, MAY increase TO FOUR TIMES DAILY AS NEEDED FOR HIGH BLOOD PRESSURE (Patient taking differently: Take 150 mg by mouth See admin instructions. TAKE 1 AND 1/2 TABLETS BY MOUTH THREE TIMES DAILY, MAY increase TO FOUR TIMES DAILY AS NEEDED FOR HIGH BLOOD PRESSURE) 150 tablet 11   predniSONE (DELTASONE) 20 MG tablet Take 1 tablet (20 mg total) by mouth daily. For 1-2 days prn gout flare 20 tablet 0   valsartan (DIOVAN) 320 MG tablet TAKE ONE TABLET BY MOUTH ONCE DAILY (Patient taking differently: Take 320 mg by mouth daily.) 90 tablet 0   fexofenadine (ALLEGRA) 180 MG tablet Take 180 mg by mouth daily.     No facility-administered medications prior to visit.     Per HPI unless specifically indicated in ROS section below Review of Systems  Constitutional:  Negative for activity change, appetite change, chills, fatigue,  fever and unexpected weight change.  HENT:  Negative for hearing loss.   Eyes:  Negative for visual disturbance.  Respiratory:  Positive for cough and wheezing. Negative for chest tightness and shortness of breath.   Cardiovascular:  Negative for chest pain, palpitations and leg swelling.  Gastrointestinal:  Negative for abdominal distention, abdominal pain, blood in stool, constipation, diarrhea, nausea and vomiting.  Genitourinary:  Negative for difficulty urinating and hematuria.  Musculoskeletal:  Negative for arthralgias, myalgias and neck pain.  Skin:  Negative for rash.  Neurological:  Negative for dizziness, seizures, syncope and headaches.  Hematological:  Negative for adenopathy. Does not bruise/bleed easily.  Psychiatric/Behavioral:  Negative for dysphoric mood. The  patient is not nervous/anxious.     Objective:  BP (!) 154/84   Pulse 71   Temp 97.7 F (36.5 C) (Temporal)   Ht 5' 5.25" (1.657 m)   Wt 233 lb 2 oz (105.7 kg)   SpO2 97%   BMI 38.50 kg/m   Wt Readings from Last 3 Encounters:  03/18/22 233 lb 2 oz (105.7 kg)  02/25/22 231 lb 11.2 oz (105.1 kg)  12/29/21 232 lb 4 oz (105.3 kg)      Physical Exam Vitals and nursing note reviewed.  Constitutional:      General: He is not in acute distress.    Appearance: Normal appearance. He is well-developed. He is not ill-appearing.  HENT:     Head: Normocephalic and atraumatic.     Right Ear: Hearing, tympanic membrane, ear canal and external ear normal.     Left Ear: Hearing, tympanic membrane, ear canal and external ear normal.  Eyes:     General: No scleral icterus.    Extraocular Movements: Extraocular movements intact.     Conjunctiva/sclera: Conjunctivae normal.     Pupils: Pupils are equal, round, and reactive to light.  Neck:     Thyroid: No thyroid mass or thyromegaly.     Vascular: No carotid bruit.  Cardiovascular:     Rate and Rhythm: Normal rate and regular rhythm.     Pulses: Normal pulses.           Radial pulses are 2+ on the right side and 2+ on the left side.     Heart sounds: Normal heart sounds. No murmur heard. Pulmonary:     Effort: Pulmonary effort is normal. No respiratory distress.     Breath sounds: Normal breath sounds. No wheezing, rhonchi or rales.  Abdominal:     General: Bowel sounds are normal. There is distension.     Palpations: Abdomen is soft. There is no mass.     Tenderness: There is no abdominal tenderness. There is no guarding or rebound.     Hernia: No hernia is present.  Musculoskeletal:        General: Normal range of motion.     Cervical back: Normal range of motion and neck supple.     Right lower leg: Edema (tr) present.     Left lower leg: Edema (chronic 1+) present.  Lymphadenopathy:     Cervical: No cervical adenopathy.  Skin:    General: Skin is warm and dry.     Findings: No rash.  Neurological:     General: No focal deficit present.     Mental Status: He is alert and oriented to person, place, and time.  Psychiatric:        Mood and Affect: Mood normal.        Behavior: Behavior normal.        Thought Content: Thought content normal.        Judgment: Judgment normal.       Lab Results  Component Value Date   CREATININE 1.20 02/26/2022   BUN 18 02/26/2022   NA 139 02/26/2022   K 4.2 02/26/2022   CL 105 02/26/2022   CO2 27 02/26/2022   Lab Results  Component Value Date   WBC 6.1 02/26/2022   HGB 14.8 02/26/2022   HCT 42.0 02/26/2022   MCV 90.1 02/26/2022   PLT 136 (L) 02/26/2022     Assessment & Plan:   Problem List Items Addressed This Visit     Health maintenance  examination - Primary (Chronic)    Preventative protocols reviewed and updated unless pt declined. Discussed healthy diet and lifestyle.       Advanced care planning/counseling discussion (Chronic)    Advanced directive discussion - has living will at home. Wife is Occupational psychologist. Asked to bring copy. full code attempt but wouldn't want prolonged life  support.       Encounter for chronic pain management (Chronic)    [place] CSRS reviewed.  Has been receiving #90 tab hydrocodone 10/'325mg'$  per month, interested in decreasing # per month as he's not regularly taking 3/day. Takes 0-4 tab/day, may do well with 45/month. Discussed trial cutting tablets in half and updating with effect on lower dose opiate.       GERD (gastroesophageal reflux disease)    Stable period off medication      HTN (hypertension)    Chronic, difficult to control blood pressure. Now off doxazosin, on full dose carvedilol. BP remains mildly elevated. Advised cards f/u for further management. Next cardiology and EP appt 05/2022.      Relevant Medications   valsartan (DIOVAN) 320 MG tablet   hydrALAZINE (APRESOLINE) 100 MG tablet   HLD (hyperlipidemia)    Chronic, on pravastatin. Update FLP today.  The 10-year ASCVD risk score (Arnett DK, et al., 2019) is: 25.2%   Values used to calculate the score:     Age: 23 years     Sex: Male     Is Non-Hispanic African American: No     Diabetic: No     Tobacco smoker: No     Systolic Blood Pressure: 185 mmHg     Is BP treated: Yes     HDL Cholesterol: 64 mg/dL     Total Cholesterol: 171 mg/dL       Relevant Medications   valsartan (DIOVAN) 320 MG tablet   hydrALAZINE (APRESOLINE) 100 MG tablet   Other Relevant Orders   Lipid panel   Comprehensive metabolic panel   OSA (obstructive sleep apnea)    Chronic, has previously not tolerated CPAP mask.  Known history of pulmonary hypertension and difficult to control hypertension.      Gout    Update uric acid levels on as needed colchicine and probenecid/NSAID.      Relevant Orders   Uric acid   Thrombocytopenia (HCC)    Update CBC today       Relevant Orders   CBC with Differential/Platelet   Severe obesity (BMI 35.0-39.9) with comorbidity ([place])    Activity limited by pain.  Continue to encourage healthy diet changes to affect sustainable weight loss.       Nasal congestion    See HPI-discussed Flonase use.      Allergic rhinitis    All antihistamines including nonsedating kind are very sedating to him.  He has tried Human resources officer and Claritin but has difficulty tolerating these due to sleepiness.       Sinus bradycardia    Recent hospitalization status post pacemaker placement.      Relevant Medications   valsartan (DIOVAN) 320 MG tablet   hydrALAZINE (APRESOLINE) 100 MG tablet   Status cardiac pacemaker   Other Visit Diagnoses     Special screening for malignant neoplasm of prostate       Relevant Orders   PSA        Meds ordered this encounter  Medications   fluticasone (FLONASE) 50 MCG/ACT nasal spray    Sig: Place 2 sprays into both nostrils daily.  Dispense:  16 g    Refill:  11   valsartan (DIOVAN) 320 MG tablet    Sig: Take 1 tablet (320 mg total) by mouth daily.    Dispense:  90 tablet    Refill:  3   hydrALAZINE (APRESOLINE) 100 MG tablet    Sig: TAKE 1 AND 1/2 TABLETS BY MOUTH THREE TIMES DAILY, MAY increase TO FOUR TIMES DAILY AS NEEDED FOR HIGH BLOOD PRESSURE    Dispense:  150 tablet    Refill:  11   colchicine 0.6 MG tablet    Sig: Take 1 tablet (0.6 mg total) by mouth daily.    Dispense:  90 tablet    Refill:  0   Orders Placed This Encounter  Procedures   Lipid panel   Comprehensive metabolic panel   CBC with Differential/Platelet   PSA   Uric acid    Patient instructions: We don't have high dose flu shot yet.  Try flonase nasal steroid 2 squirt into each nostril daily. May take with nasal saline irrigation.  Labs today.  If interested, check with pharmacy about new 2 shot shingles series (shingrix).  Return in 3 months for chronic pain follow up visit. Good to see you today  Follow up plan: Return in about 3 months (around 06/18/2022), or if symptoms worsen or fail to improve, for follow up visit.  [name], MD

## 2022-03-18 NOTE — Assessment & Plan Note (Addendum)
[place] CSRS reviewed.  Has been receiving #90 tab hydrocodone 10/'325mg'$  per month, interested in decreasing # per month as he's not regularly taking 3/day. Takes 0-4 tab/day, may do well with 45/month. Discussed trial cutting tablets in half and updating with effect on lower dose opiate.

## 2022-03-18 NOTE — Assessment & Plan Note (Signed)
Stable period off medication.  

## 2022-03-18 NOTE — Assessment & Plan Note (Signed)
Recent hospitalization status post pacemaker placement.

## 2022-03-18 NOTE — Assessment & Plan Note (Signed)
Update uric acid levels on as needed colchicine and probenecid/NSAID.

## 2022-03-18 NOTE — Assessment & Plan Note (Signed)
Chronic, has previously not tolerated CPAP mask.  Known history of pulmonary hypertension and difficult to control hypertension.

## 2022-03-18 NOTE — Assessment & Plan Note (Signed)
See HPI-discussed Flonase use.

## 2022-03-18 NOTE — Assessment & Plan Note (Signed)
Preventative protocols reviewed and updated unless pt declined. Discussed healthy diet and lifestyle.  

## 2022-03-18 NOTE — Assessment & Plan Note (Signed)
Chronic, on pravastatin. Update FLP today.  The 10-year ASCVD risk score (Arnett DK, et al., 2019) is: 25.2%   Values used to calculate the score:     Age: 71 years     Sex: Male     Is Non-Hispanic African American: No     Diabetic: No     Tobacco smoker: No     Systolic Blood Pressure: 813 mmHg     Is BP treated: Yes     HDL Cholesterol: 64 mg/dL     Total Cholesterol: 171 mg/dL

## 2022-03-18 NOTE — Assessment & Plan Note (Signed)
All antihistamines including nonsedating kind are very sedating to him.  He has tried Human resources officer and Claritin but has difficulty tolerating these due to sleepiness.

## 2022-03-20 ENCOUNTER — Encounter: Payer: Self-pay | Admitting: Family Medicine

## 2022-03-26 ENCOUNTER — Telehealth: Payer: Self-pay | Admitting: Cardiology

## 2022-03-26 NOTE — Telephone Encounter (Signed)
  Pt's wife said, pt's incision area seems like there's something in there, pustule and reddish she thinks it needs to be remove

## 2022-03-26 NOTE — Telephone Encounter (Signed)
Offered patient device clinic apt today or tomorrow. Patient states he is not able to come until next week. Patient aware to call if area has drainage, bleeding, redness of swelling become present. Device clinic apt made 04/02/22 @ 3:20 pm. Location, date and time discussed with patient w/ verbal understanding.    Patient reports of pustule near incision area for approx. 2 weeks. Denies any redness, drainage, swelling, fever, chills or bleeding.

## 2022-04-02 ENCOUNTER — Ambulatory Visit: Payer: PPO | Attending: Interventional Cardiology

## 2022-04-02 MED ORDER — CEPHALEXIN 500 MG PO CAPS: 500.0000 mg | ORAL_CAPSULE | Freq: Three times a day (TID) | ORAL | 0 refills | Status: AC

## 2022-04-02 NOTE — Progress Notes (Signed)
Patient added to device clinic schedule after wife phoned in concerned about a pustule at incision site.  Per patient, about 3 days ago a pustule formed and then burst releasing a minimal amount of fluid.  At this time, the site has scabbed over.  Wound site assessed by Dr. Caryl Comes.  Small scab assessed at proximal edge of incision.  No purulent drainage noted.  Small amount of sanguinous drainage noted.    Orders received from Dr. Caryl Comes.  Pt to start Keflex 500 mg PO TID for 5 days.  Warm soapy wash to wound TID.  Follow up scheduled for next Wednesday for wound recheck.  If needed will refer to wound clinic.

## 2022-04-02 NOTE — Patient Instructions (Addendum)
Medication Instructions:   Your physician has recommended you make the following change in your medication:    Start taking Keflex 500 mg-  Take one tablet by mouth 3 times a day  2.    Wash your wound site with warm soapy water and a washcloth 3 times a day  Follow-Up:  April 08, 2022 at 1:00 pm at the [facility] office  Cephalexin Capsules or Tablets What is this medication? CEPHALEXIN (sef a LEX in) treats infections caused by bacteria. It belongs to a group of medications called cephalosporin antibiotics. It will not treat colds, the flu, or infections caused by viruses. This medicine may be used for other purposes; ask your health care provider or pharmacist if you have questions. COMMON BRAND NAME(S): Biocef, Daxbia, Keflex, Keftab What should I tell my care team before I take this medication? They need to know if you have any of these conditions: Bleeding disorder Kidney disease Liver disease Seizures Stomach or intestine problems like colitis An unusual or allergic reaction to cephalexin, other penicillin or cephalosporin antibiotics, other medications, foods, dyes, or preservatives Pregnant or trying to get pregnant Breast-feeding How should I use this medication? Take this medication by mouth. Take it as directed on the prescription label at the same time every day. You can take it with or without food. If it upsets your stomach, take it with food. Take all of this medication unless your care team tells you to stop it early. Keep taking it even if you think you are better. Talk to your care team about the use of this medication in children. While it may be prescribed for selected conditions, precautions do apply. Overdosage: If you think you have taken too much of this medicine contact a poison control center or emergency room at once. NOTE: This medicine is only for you. Do not share this medicine with others. What if I miss a dose? If you miss a dose, take it as soon  as you can. If it is almost time for your next dose, take only that dose. Do not take double or extra doses. What may interact with this medication? Probenecid Some other antibiotics This list may not describe all possible interactions. Give your health care provider a list of all the medicines, herbs, non-prescription drugs, or dietary supplements you use. Also tell them if you smoke, drink alcohol, or use illegal drugs. Some items may interact with your medicine. What should I watch for while using this medication? Tell your care team if your symptoms do not start to get better or if they get worse. Do not treat diarrhea with over the counter products. Contact your care team if you have diarrhea that lasts more than 2 days or if it is severe and watery. This medication may cause serious skin reactions. They can happen weeks to months after starting the medication. Contact your care team right away if you notice fevers or flu-like symptoms with a rash. The rash may be red or purple and then turn into blisters or peeling of the skin. Or, you might notice a red rash with swelling of the face, lips or lymph nodes in your neck or under your arms. If you have diabetes, you may get a false-positive result for sugar in your urine. Check with your care team. What side effects may I notice from receiving this medication? Side effects that you should report to your care team as soon as possible: Allergic reactions--skin rash, itching, hives, swelling of the  face, lips, tongue, or throat Redness, blistering, peeling, or loosening of the skin, including inside the mouth Severe diarrhea, fever Unusual vaginal discharge, itching, or odor Side effects that usually do not require medical attention (report to your care team if they continue or are bothersome): Diarrhea Headache Nausea This list may not describe all possible side effects. Call your doctor for medical advice about side effects. You may report side  effects to FDA at 1-800-FDA-1088. Where should I keep my medication? Keep out of the reach of children and pets. Store at room temperature between 20 and 25 degrees C (68 and 77 degrees F). Throw away any unused medication after the expiration date. NOTE: This sheet is a summary. It may not cover all possible information. If you have questions about this medicine, talk to your doctor, pharmacist, or health care provider.  2023 Elsevier/Gold Standard (2007-09-03 00:00:00)

## 2022-04-08 ENCOUNTER — Ambulatory Visit: Payer: PPO

## 2022-04-10 ENCOUNTER — Other Ambulatory Visit: Payer: Self-pay | Admitting: Family Medicine

## 2022-04-10 NOTE — Telephone Encounter (Signed)
Name of Medication: Hydrocodone-APAP Name of Pharmacy: [facility] Last Fill or Written Date and Quantity: 03/13/22, #90 Last Office Visit and Type: 03/18/22, CPE prt 2 Next Office Visit and Type: none Last Controlled Substance Agreement Date: 09/29/16 Last UDS: 03/10/19

## 2022-04-11 NOTE — Telephone Encounter (Signed)
Per our discussion at last OV, he was interested in decreasing # per month - will send in #75 this month and if doing well with continue decreasing #.

## 2022-04-13 ENCOUNTER — Telehealth: Payer: Self-pay

## 2022-04-13 NOTE — Progress Notes (Unsigned)
Chronic Care Management Pharmacy Assistant   Name: Randy Kennedy  MRN: [id number] DOB: 07-13-1951  Reason for Encounter: CCM (Medication Adherence and Delivery Coordination)  Recent office visits:  03/18/22 [name], MD Annual Exam Abnormal Labs: "Your cholesterol levels, predominantly triglycerides, returned high - continue pravastatin, and decrease added sugars, eliminate trans fats, increase fiber and limit alcohol. Increase fatty fish (such as salmon, tuna, trout) in the diet - these fish are rich in omega three fatty acids." Start: fluticasone (FLONASE) 50 MCG/ACT nasal spray Stop: fexofenadine (ALLEGRA) 180 MG tablet Stop: predniSONE (DELTASONE) 20 MG tablet FU 3 months  Recent consult visits:  04/02/22 Cardiology Clinical Support - Start: Cephalexin 500 mg due to pustule at incision site   Hospital visits:  Medication Reconciliation was completed by comparing discharge summary, patient's EMR and Pharmacy list, and upon discussion with patient.  Admitted to the hospital on 02/25/2022 due to symptomatic bradycardia/SSS . Discharge date was 02/28/2022. Discharged from [facility].    Procedure: Pacemaker Implantation  New?Medications Started at [facility] Discharge:?? -started carvedilol (COREG) 25 MG tablet  Medication Changes at Hospital Discharge: No changes  Medications Discontinued at Hospital Discharge: -Stopped cloNIDine 0.1 MG tablet ([place])  Medications that remain the same after Hospital Discharge:??  -All other medications will remain the same.    Medications: Outpatient Encounter Medications as of 04/13/2022  Medication Sig Note   aspirin EC 81 MG tablet Take 81 mg by mouth daily.    carvedilol (COREG) 25 MG tablet Take 1 tablet (25 mg total) by mouth 2 (two) times daily with a meal.    colchicine 0.6 MG tablet Take 1 tablet (0.6 mg total) by mouth daily.    fluticasone (FLONASE) 50 MCG/ACT nasal spray Place 2 sprays into both nostrils  daily.    furosemide (LASIX) 20 MG tablet Take 1 tablet (20 mg total) by mouth 3 (three) times a week.    gabapentin (NEURONTIN) 300 MG capsule TAKE ONE CAPSULE BY MOUTH TWICE DAILY and TAKE TWO CAPSULES EVERYDAY AT BEDTIME (Patient taking differently: See admin instructions. Take one capsule by mouth twice daily and take two capsules everyday at bedtime)    hydrALAZINE (APRESOLINE) 100 MG tablet TAKE 1 AND 1/2 TABLETS BY MOUTH THREE TIMES DAILY, MAY increase TO FOUR TIMES DAILY AS NEEDED FOR HIGH BLOOD PRESSURE    HYDROcodone-acetaminophen (NORCO) 10-325 MG tablet TAKE ONE TABLET BY MOUTH every SIX hours AS NEEDED    meloxicam (MOBIC) 15 MG tablet TAKE 1 TABLET BY MOUTH ONCE DAILY AS NEEDED FOR  GOUT  FLARE (Patient taking differently: Take 15 mg by mouth daily as needed (gout flare).)    nitroGLYCERIN (NITROSTAT) 0.4 MG SL tablet Place 1 tablet (0.4 mg total) under the tongue every 5 (five) minutes as needed for chest pain. 02/25/2022: On hand, hasn't needed    pravastatin (PRAVACHOL) 40 MG tablet TAKE ONE TABLET BY MOUTH ONCE DAILY (Patient taking differently: Take 40 mg by mouth daily.)    probenecid (BENEMID) 500 MG tablet TAKE 1/2 TABLET BY MOUTH TWICE DAILY (Patient taking differently: Take 250 mg by mouth 2 (two) times daily. TAKE 1/2 TABLET BY MOUTH TWICE DAILY)    valsartan (DIOVAN) 320 MG tablet Take 1 tablet (320 mg total) by mouth daily.    vitamin C (ASCORBIC ACID) 500 MG tablet Take 500 mg by mouth daily.    No facility-administered encounter medications on file as of 04/13/2022.   BP Readings from Last 3 Encounters:  03/18/22 Marland Kitchen)  154/84  02/28/22 (!) 177/99  12/29/21 (!) 156/68    Lab Results  Component Value Date   HGBA1C 5.6 10/24/2020    Recent OV, Consult or Hospital visit:  Recent medication changes indicated:   Start: Cephalexin 500 mg due to pustule at incision site   Start: fluticasone (FLONASE) 50 MCG/ACT nasal spray   Stop: fexofenadine (ALLEGRA) 180 MG tablet    Stop: predniSONE (DELTASONE) 20 MG tablet  Last adherence delivery date: 01/22/22      Patient is due for next adherence delivery on: 04/23/22  {Med Review:25223}  This delivery to include: Vials  90 Days  Furosemide 20 mg - 1 tab three days a week Doxazosin 2 mg 2x daily; if >140 take extra Doxazosin Hydralazine 100 mg- Take 1.5 tablets TID may increase to QID if needed for blood pressure Probenecid 500 mg  -  tablet BID Pravastatin 40 mg  - 1 tablet qpm Gabapentin '300mg'$  take 1 tablet at breakfast 1 tablet evening meal, 2 tablets at bedtime Valsartan 320 mg take  1 tablet at breakfast Colchicine 0.'6mg'$  take 1 tablet at breakfast  Hydrocodone 10/'325mg'$  take 1 tablet every 6 hours prn pain  Fluticasone nasal spray 50 mcg 2 sprays in each nostril daily  Patient declined the following medications this month:  Any concerns about your medications? {yes/no:20286}  How often do you forget or accidentally miss a dose? {Missed doses:25554}  Do you use a pillbox? Yes  Is patient in packaging No  Refills requested from providers include: Complete Hydrocodone 10/'325mg'$  take 1 tablet every 6 hours prn pain    {Delivery date:25786} 04/23/22  Recent blood pressure readings are as follows:***   Annual wellness visit in last year? Yes 10/02/21 Most Recent BP reading: 154/84 on 03/18/22  Cycle dispensing form sent to {Medreview:27653} for review.  Charlene Brooke, CPP notified  Marijean Niemann, [place] Clinical Pharmacy Assistant [phone]

## 2022-04-13 NOTE — Progress Notes (Unsigned)
Cardiology Office Note Date:  04/13/2022  Patient ID:  Kennedy Kennedy, Kennedy Kennedy 11-Dec-1950, MRN [id number] PCP:  Randy Bush, MD  Cardiologist:  Kennedy Kennedy Electrophysiologist: Kennedy Kennedy  ***refresh   Chief Complaint: *** f/u wound eval  History of Present Illness: Kennedy Kennedy is a 71 y.o. 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  Hospitalized 02/25/22 with symptomatic bradycardia, clonidine washed out with persistent HRs 40's, with baseline conduction system disease underwent PPM implant Discharged 02/28/22  Had his wound check 03/12/22, wound reported as well healed.  Wife called 04/02/22 with concerns about the site, seen same day in the clinic, evaluated by RN and Kennedy Kennedy, smal scab at proximal edge with some serosanguinous drainage, given Keflex for 5 days and instructed to wash gently with warm soapy water.  *** site? *** symptoms, infection, otherwise? *** acute outputs    Device information Abbott dual chamber PPM implanted 02/27/2022   Past Medical History:  Diagnosis Date   Allergic rhinitis    Arthritis    Chest pain    a. 11/2011 reportedly normal stress test performed in [place].   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  moderate, AHI 37.4 increased to 80 in REM, on BiPAP 14/10, 97% compliance >4 hrs (06/2013); b. Does not tolerate CPAP.   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 Treated with vicodin, prior tried fentanyl and butrans.   Systolic murmur    Wears dentures    Partial lower   Wears hearing aid in both ears    Does not wear all the time    Past Surgical History:  Procedure Laterality Date   CATARACT EXTRACTION W/PHACO Right 11/26/2020   Procedure: CATARACT EXTRACTION PHACO AND INTRAOCULAR LENS PLACEMENT (I[facility]) RIGHT VIVITY TORIC LENS 7.17 00:49.6;  Surgeon: Birder Robson, MD;  Location: [facility];  Service: Ophthalmology;  Laterality: Right;  Latex Sleep Apnea   CATARACT EXTRACTION W/PHACO Left 12/10/2020   Procedure: CATARACT EXTRACTION PHACO AND INTRAOCULAR LENS PLACEMENT ([place]) LEFT VIVITY;  Surgeon: Birder Robson, MD;  Location: [facility];  Service: Ophthalmology;  Laterality: Left;  Latex Sleep apnea 5.37 00:32.7   COLONOSCOPY  02/2011   ext hem, benign polyp, rpt 5 yrs ([place])   COLONOSCOPY WITH PROPOFOL N/A 12/17/2020   multiple TA, ext hem, rpt 3 yrs ([place], Tally Due, MD)  LEG SURGERY  x5   left - after fall at work (3.5 stories)   [place]   jaw fracture - horse kick   MOHS SURGERY Right 07/03/2021   Ear   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   Procedure: PACEMAKER IMPLANT;  Surgeon: Constance Haw, MD;  Location: [facility];  Service: Cardiovascular;  Laterality: N/A;   TYMPANIC MEMBRANE REPAIR Left 1994   otosclerosis    Current Outpatient Medications  Medication Sig Dispense Refill   aspirin EC 81 MG tablet Take 81 mg by mouth daily.     carvedilol (COREG) 25 MG tablet Take 1 tablet (25 mg total) by  mouth 2 (two) times daily with a meal. 180 tablet 0   colchicine 0.6 MG tablet Take 1 tablet (0.6 mg total) by mouth daily. 90 tablet 0   fluticasone (FLONASE) 50 MCG/ACT nasal spray Place 2 sprays into both nostrils daily. 16 g 11   furosemide (LASIX) 20 MG tablet Take 1 tablet (20 mg total) by mouth 3 (three) times a week. 39 tablet 3   gabapentin (NEURONTIN) 300 MG capsule TAKE ONE CAPSULE BY MOUTH TWICE DAILY and TAKE TWO CAPSULES EVERYDAY AT BEDTIME (Patient taking differently: See admin instructions. Take one capsule by mouth twice daily and take two capsules everyday at bedtime) 360 capsule 3   hydrALAZINE (APRESOLINE) 100 MG tablet TAKE 1 AND 1/2 TABLETS BY MOUTH THREE TIMES DAILY, MAY increase TO FOUR TIMES DAILY AS NEEDED FOR HIGH BLOOD PRESSURE 150 tablet 11   HYDROcodone-acetaminophen (NORCO) 10-325 MG tablet TAKE ONE TABLET BY MOUTH every SIX hours AS NEEDED 75 tablet 0   meloxicam (MOBIC) 15 MG tablet TAKE 1 TABLET BY MOUTH ONCE DAILY AS NEEDED FOR  GOUT  FLARE (Patient taking differently: Take 15 mg by mouth daily as needed (gout flare).) 30 tablet 0   nitroGLYCERIN (NITROSTAT) 0.4 MG SL tablet Place 1 tablet (0.4 mg total) under the tongue every 5 (five) minutes as needed for chest pain. 25 tablet 3   pravastatin (PRAVACHOL) 40 MG tablet TAKE ONE TABLET BY MOUTH ONCE DAILY (Patient taking differently: Take 40 mg by mouth daily.) 90 tablet 3   probenecid (BENEMID) 500 MG tablet TAKE 1/2 TABLET BY MOUTH TWICE DAILY (Patient taking differently: Take 250 mg by mouth 2 (two) times daily. TAKE 1/2 TABLET BY MOUTH TWICE DAILY) 90 tablet 3   valsartan (DIOVAN) 320 MG tablet Take 1 tablet (320 mg total) by mouth daily. 90 tablet 3   vitamin C (ASCORBIC ACID) 500 MG tablet Take 500 mg by mouth daily.     No current facility-administered medications for this visit.    Allergies:   Amlodipine, Allopurinol, Lisinopril-hydrochlorothiazide, Procardia [nifedipine], Spironolactone, Clonidine  derivatives, Doxazosin mesylate, Latex, Nortriptyline, and Uloric [febuxostat]   Social History:  The patient  reports that he quit smoking about 43 years ago. His smoking use included pipe and cigarettes. He has a 4.00 pack-year smoking history. He has never used smokeless tobacco. He reports that he does not drink alcohol and does not use drugs.   Family History:  The patient's family history includes Cancer in his mother and paternal uncle; Cancer (age of onset: 88) in his father; Hypertension in his mother.  ROS:  Please see the history of present illness.    All other systems are reviewed and otherwise negative.   PHYSICAL EXAM:  VS:  There were no vitals taken for this visit. BMI: There is no height or weight on  file to calculate BMI. Well nourished, well developed, in no acute distress HEENT: normocephalic, atraumatic Neck: no JVD, carotid bruits or masses Cardiac:  *** RRR; no significant murmurs, no rubs, or gallops Lungs:  *** CTA b/l, no wheezing, rhonchi or rales Abd: soft, nontender MS: no deformity or *** atrophy Ext: *** no edema Skin: warm and dry, no rash Neuro:  No gross deficits appreciated Psych: euthymic mood, full affect  *** PPM site is stable, no tethering or discomfort   EKG:  not done today  Device interrogation done today and reviewed by myself:  ***  02/26/22; TTE  1. Left ventricular ejection fraction, by estimation, is 60 to 65%. The  left ventricle has normal function. The left ventricle has no regional  wall motion abnormalities. Left ventricular diastolic parameters are  consistent with Grade II diastolic  dysfunction (pseudonormalization).   2. Right ventricular systolic function is normal. The right ventricular  size is moderately enlarged. There is normal pulmonary artery systolic  pressure.   3. Left atrial size was moderately dilated.   4. Right atrial size was severely dilated.   5. The mitral valve is normal in structure. No evidence of  mitral valve  regurgitation. No evidence of mitral stenosis.   6. The aortic valve is tricuspid. Aortic valve regurgitation is trivial.  Aortic valve sclerosis/calcification is present, without any evidence of  aortic stenosis.   7. The inferior vena cava is normal in size with greater than 50%  respiratory variability, suggesting right atrial pressure of 3 mmHg.      Echo 06/11/2017 Normal EF, elevated RVSP Echocardiogram November 2018 Left ventricle: The cavity size was normal. There was moderate   concentric hypertrophy. Systolic function was normal. The   estimated ejection fraction was in the range of 55% to 60%. Wall   motion was normal; there were no regional wall motion   abnormalities. Features are consistent with a pseudonormal left   ventricular filling pattern, with concomitant abnormal relaxation   and increased filling pressure (grade 2 diastolic dysfunction). - Aortic valve: There was trivial regurgitation. - Mitral valve: There was mild regurgitation. - Left atrium: The atrium was mildly dilated. - Right atrium: The atrium was moderately dilated. - Pulmonary arteries: Systolic pressure was moderately increased.   PA peak pressure: 48 mm Hg (S).  Recent Labs: 03/18/2022: ALT 25; BUN 23; Creatinine, Ser 1.18; Hemoglobin 14.4; Platelets 154.0; Potassium 4.0; Sodium 139  12/29/2021: LDL Cholesterol 63 03/18/2022: Cholesterol 180; Direct LDL 83.0; HDL 62.00; Total CHOL/HDL Ratio 3; Triglycerides 337.0; VLDL 67.4   CrCl cannot be calculated (Patient's most recent lab result is older than the maximum 21 days allowed.).   Wt Readings from Last 3 Encounters:  03/18/22 233 lb 2 oz (105.7 kg)  02/25/22 231 lb 11.2 oz (105.1 kg)  12/29/21 232 lb 4 oz (105.3 kg)     Other studies reviewed: Additional studies/records reviewed today include: summarized above  ASSESSMENT AND PLAN:  PPM Wound evaluation ***  HTN ***  Disposition: F/u with ***  Current medicines are  reviewed at length with the patient today.  The patient did not have any concerns regarding medicines.  Venetia Night, PA-C 04/13/2022 8:48 AM     [place]  [phone] (office)  [phone] (fax)

## 2022-04-13 NOTE — Telephone Encounter (Signed)
Lvm asking pt to call back.  Need to relay Dr. G's message.  

## 2022-04-14 NOTE — Telephone Encounter (Signed)
Spoke with pt relaying Dr. G's message. Pt verbalizes understanding.  

## 2022-04-15 ENCOUNTER — Encounter: Payer: Self-pay | Admitting: Physician Assistant

## 2022-04-15 ENCOUNTER — Ambulatory Visit: Payer: PPO | Attending: Physician Assistant | Admitting: Physician Assistant

## 2022-04-15 VITALS — BP 188/100 | HR 69 | Ht 67.0 in | Wt 230.6 lb

## 2022-04-15 DIAGNOSIS — I10 Essential (primary) hypertension: Secondary | ICD-10-CM | POA: Diagnosis not present

## 2022-04-15 DIAGNOSIS — Z950 Presence of cardiac pacemaker: Secondary | ICD-10-CM

## 2022-04-15 DIAGNOSIS — Z5189 Encounter for other specified aftercare: Secondary | ICD-10-CM

## 2022-04-15 LAB — CUP PACEART INCLINIC DEVICE CHECK
Battery Remaining Longevity: 127 mo
Battery Voltage: 3.05 V
Brady Statistic RA Percent Paced: 99.34 %
Brady Statistic RV Percent Paced: 2 %
Date Time Interrogation Session: 20230920171900
Implantable Lead Implant Date: 20230804
Implantable Lead Implant Date: 20230804
Implantable Lead Location: 753859
Implantable Lead Location: 753860
Implantable Pulse Generator Implant Date: 20230804
Lead Channel Impedance Value: 487.5 Ohm
Lead Channel Impedance Value: 562.5 Ohm
Lead Channel Pacing Threshold Amplitude: 0.625 V
Lead Channel Pacing Threshold Amplitude: 0.75 V
Lead Channel Pacing Threshold Pulse Width: 0.5 ms
Lead Channel Pacing Threshold Pulse Width: 0.5 ms
Lead Channel Sensing Intrinsic Amplitude: 12 mV
Lead Channel Sensing Intrinsic Amplitude: 2.7 mV
Lead Channel Setting Pacing Amplitude: 1 V
Lead Channel Setting Pacing Amplitude: 1.625
Lead Channel Setting Pacing Pulse Width: 0.5 ms
Lead Channel Setting Sensing Sensitivity: 2 mV
Pulse Gen Model: 2272
Pulse Gen Serial Number: 8101269

## 2022-04-15 NOTE — Patient Instructions (Signed)
Medication Instructions:   Your physician recommends that you continue on your current medications as directed. Please refer to the Current Medication list given to you today.  *If you need a refill on your cardiac medications before your next appointment, please call your pharmacy*   Lab Work: [place]    If you have labs (blood work) drawn today and your tests are completely normal, you will receive your results only by: [place] (if you have MyChart) OR A paper copy in the mail If you have any lab test that is abnormal or we need to change your treatment, we will call you to review the results.   Testing/Procedures: NONE ORDERED  TODAY    Follow-Up: At [facility], you and your health needs are our priority.  As part of our continuing mission to provide you with exceptional heart care, we have created designated Provider Care Teams.  These Care Teams include your primary Cardiologist (physician) and Advanced Practice Providers (APPs -  Physician Assistants and Nurse Practitioners) who all work together to provide you with the care you need, when you need it.  We recommend signing up for the patient portal called "MyChart".  Sign up information is provided on this After Visit Summary.  MyChart is used to connect with patients for Virtual Visits (Telemedicine).  Patients are able to view lab/test results, encounter notes, upcoming appointments, etc.  Non-urgent messages can be sent to your provider as well.   To learn more about what you can do with MyChart, go to [email or web address].    Your next appointment:  [place] AS SCHEDULED    The format for your next appointment:    In Person  Provider: Oval Linsey sooner that November or  2 -3 weeks with Pharmacist for HTN        Other Instructions  Important Information About Sugar

## 2022-04-28 NOTE — Progress Notes (Signed)
[facility] Initial Assessment:    Date:  05/01/2022   ID:  Kennedy Kennedy, DOB 12/15/1950, MRN [id number]  PCP:  Randy Bush, Kennedy  Cardiologist:  None  Nephrologist:  Referring Kennedy: Kennedy Jamaica, PA-C   CC: Hypertension  History of Present Illness:    Kennedy Kennedy is a 71 y.o. 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   Patient of Dr. Rockey Kennedy. Prior echo 05/2017 with normal LVEF, gr2DD, mild MR, elevated PASP. Unable to tolerate CPAP.   Admitted 02/25/22-02/28/22 with symptomatic bradycardia. Underwent clonidine washout with persistent heart rate in the 40s and underwent PPM.Echo 02/26/22 EF 60-65%, gr2DD, RV moderately enlarged, normal PASP, LA moderately dilated, RA severely dilated, aortic sclerosis without stenisis.  Wound check 03/12/22 well healed. Seen 04/02/22 for wound concerns with small scab at proximal edge with serosanguinous drainage treated with Keflex x 5 days. Most recently seen 04/15/22 by Tommye Standard, PA. Site healed well, some sinus difficulties, home BP 160s/80s. He was referred to [facility].   Kennedy Kennedy was diagnosed with hypertension over 20 years ago. It has been difficult to control. Blood pressure checked with arm cuff at home. Readings have been 160-180/84-90. Has had checked for accuracy previous. Usually checks 2 hours after medications. No current tobacco use. Alcohol use never. For exercise he has no formal routine. he eats at home and does follow low sodium diet. Takes medication 8am, 3pm, 11pm. He does most of the housework as his wife watches grandchildren who range from newborns to 71 years old. Notes since pacemaker feels energy level has increased.    Previous antihypertensives: Amlodipine 2022 - rash in groin Lisinopril-HCTZ - burning in feet Nifedipine  2014- bradycardia, leg swelling Spironolactone 2018- malaise, dizziness Clonidine/Metoprolol- bradycardia Imdur 2019- headache Chlorthalidone 2014-2018- gout Doxazosin - rash, itching  Past Medical History:  Diagnosis Date   Allergic rhinitis    Arthritis    Chest pain    a. 11/2011 reportedly normal stress test performed in [place].   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 moderate, AHI 37.4 increased to 80 in REM, on BiPAP 14/10, 97% compliance >4 hrs (06/2013); b. Does not tolerate CPAP.   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 Treated with vicodin, prior tried fentanyl and butrans.  Systolic murmur    Wears dentures    Partial lower   Wears hearing aid in both ears    Does not wear all the time    Past Surgical History:  Procedure Laterality Date   CATARACT EXTRACTION W/PHACO Right 11/26/2020   Procedure: CATARACT EXTRACTION PHACO AND INTRAOCULAR LENS PLACEMENT ([facility]) RIGHT VIVITY TORIC LENS 7.17 00:49.6;  Surgeon: Kennedy Robson, Kennedy;  Location: [facility];  Service: Ophthalmology;   Laterality: Right;  Latex Sleep Apnea   CATARACT EXTRACTION W/PHACO Left 12/10/2020   Procedure: CATARACT EXTRACTION PHACO AND INTRAOCULAR LENS PLACEMENT ([place]) LEFT VIVITY;  Surgeon: Kennedy Robson, Kennedy;  Location: [facility];  Service: Ophthalmology;  Laterality: Left;  Latex Sleep apnea 5.37 00:32.7   COLONOSCOPY  02/2011   ext hem, benign polyp, rpt 5 yrs ([place])   COLONOSCOPY WITH PROPOFOL N/A 12/17/2020   multiple TA, ext hem, rpt 3 yrs (Kennedy Kennedy)   LEG SURGERY  x5   left - after fall at work (3.5 stories)   [place]   jaw fracture - horse kick   MOHS SURGERY Right 07/03/2021   Ear   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   Procedure: PACEMAKER IMPLANT;  Surgeon: Kennedy Haw, Kennedy;  Location: [facility];  Service: Cardiovascular;  Laterality: N/A;   TYMPANIC MEMBRANE REPAIR Left 1994   otosclerosis    Current Medications: Current Meds  Medication Sig   aspirin EC 81 MG tablet Take 81 mg by mouth daily.   carvedilol (COREG) 25 MG tablet Take 1 tablet (25 mg total) by mouth 2 (two) times daily with a meal.   colchicine 0.6 MG tablet Take 1 tablet (0.6 mg total) by mouth daily.   fluticasone (FLONASE) 50 MCG/ACT nasal spray Place 2 sprays into both nostrils daily.   furosemide (LASIX) 20 MG tablet Take 1 tablet (20 mg total) by mouth 3 (three) times a week.   gabapentin (NEURONTIN) 300 MG capsule TAKE ONE CAPSULE BY MOUTH TWICE DAILY and TAKE TWO CAPSULES EVERYDAY AT BEDTIME (Patient taking differently: See admin instructions. Take one capsule by mouth twice daily and take two capsules everyday at bedtime)   hydrALAZINE (APRESOLINE) 100 MG tablet TAKE 1 AND 1/2 TABLETS BY MOUTH THREE TIMES DAILY, MAY increase TO FOUR TIMES DAILY AS NEEDED FOR HIGH BLOOD PRESSURE   HYDROcodone-acetaminophen (NORCO) 10-325 MG tablet TAKE ONE TABLET BY MOUTH every SIX hours AS NEEDED   meloxicam (MOBIC) 15 MG tablet TAKE 1 TABLET BY MOUTH ONCE DAILY AS  NEEDED FOR  GOUT  FLARE (Patient taking differently: Take 15 mg by mouth daily as needed (gout flare).)   minoxidil (LONITEN) 2.5 MG tablet Take 1 tablet (2.5 mg total) by mouth at bedtime.   nitroGLYCERIN (NITROSTAT) 0.4 MG SL tablet Place 1 tablet (0.4 mg total) under the tongue every 5 (five) minutes as needed for chest pain.   pravastatin (PRAVACHOL) 40 MG tablet TAKE ONE TABLET BY MOUTH ONCE DAILY (Patient taking differently: Take 40 mg by mouth daily.)   probenecid (BENEMID) 500 MG tablet TAKE 1/2 TABLET BY MOUTH TWICE DAILY (Patient taking differently: Take 250 mg by mouth 2 (two) times daily. TAKE 1/2 TABLET BY MOUTH TWICE DAILY)   valsartan (DIOVAN) 320 MG tablet Take 1 tablet (320 mg total) by mouth daily.   vitamin C (ASCORBIC ACID) 500 MG tablet Take 500 mg by mouth daily.     Allergies:   Amlodipine, Allopurinol, Lisinopril-hydrochlorothiazide, Procardia [nifedipine], Spironolactone, Clonidine derivatives,  Doxazosin mesylate, Latex, Nortriptyline, and Uloric [febuxostat]   Social History   Socioeconomic History   Marital status: Married    Spouse name: Not on file   Number of children: Not on file   Years of education: Not on file   Highest education level: Not on file  Occupational History   Not on file  Tobacco Use   Smoking status: Former    Packs/day: 1.00    Years: 4.00    Total pack years: 4.00    Types: Pipe, Cigarettes    Quit date: 07/27/1978    Years since quitting: 43.7   Smokeless tobacco: Never  Vaping Use   Vaping Use: Never used  Substance and Sexual Activity   Alcohol use: No    Alcohol/week: 0.0 Kennedy drinks of alcohol   Drug use: No   Sexual activity: Not on file  Other Topics Concern   Not on file  Social History Narrative   Caffeine: 2 cans coke/day   Lives with wife and grown son    Occupation: retired, was Agricultural engineer.   On disability for chronic pain   Edu: MBA   Activity: no regular exercise   Deit: good water,  fruits/vegetables daily   Social Determinants of Health   Financial Resource Strain: Low Risk  (10/02/2021)   Overall Financial Resource Strain (CARDIA)    Difficulty of Paying Living Expenses: Not hard at all  Food Insecurity: No Food Insecurity (10/02/2021)   Hunger Vital Sign    Worried About Running Out of Food in the Last Year: Never true    Ran Out of Food in the Last Year: Never true  Transportation Needs: No Transportation Needs (10/02/2021)   PRAPARE - Hydrologist (Medical): No    Lack of Transportation (Non-Medical): No  Physical Activity: Insufficiently Active (04/30/2022)   Exercise Vital Sign    Days of Exercise per Week: 2 days    Minutes of Exercise per Session: 50 min  Stress: No Stress Concern Present (10/02/2021)   [place]    Feeling of Stress : Not at all  Social Connections: Moderately Isolated (10/02/2021)   Social Connection and Isolation Panel [NHANES]    Frequency of Communication with Friends and Family: More than three times a week    Frequency of Social Gatherings with Friends and Family: More than three times a week    Attends Religious Services: Never    Marine scientist or Organizations: No    Attends Music therapist: Never    Marital Status: Married     Family History: The patient's family history includes Cancer in his mother and paternal uncle; Cancer (age of onset: 56) in his father; Hypertension in his mother. There is no history of Diabetes, Stroke, or CAD.  ROS:   Please see the history of present illness.     All other systems reviewed and are negative.  EKGs/Labs/Other Studies Reviewed:    EKG:  No EKG today.   Echo 02/26/22   1. Left ventricular ejection fraction, by estimation, is 60 to 65%. The  left ventricle has normal function. The left ventricle has no regional  wall motion abnormalities. Left ventricular diastolic  parameters are  consistent with Grade II diastolic  dysfunction (pseudonormalization).   2. Right ventricular systolic function is normal. The right ventricular  size is moderately enlarged. There is normal pulmonary artery systolic  pressure.   3.  Left atrial size was moderately dilated.   4. Right atrial size was severely dilated.   5. The mitral valve is normal in structure. No evidence of mitral valve  regurgitation. No evidence of mitral stenosis.   6. The aortic valve is tricuspid. Aortic valve regurgitation is trivial.  Aortic valve sclerosis/calcification is present, without any evidence of  aortic stenosis.   7. The inferior vena cava is normal in size with greater than 50%  respiratory variability, suggesting right atrial pressure of 3 mmHg.   Recent Labs: 03/18/2022: ALT 25; BUN 23; Creatinine, Ser 1.18; Hemoglobin 14.4; Platelets 154.0; Potassium 4.0; Sodium 139   Recent Lipid Panel    Component Value Date/Time   CHOL 180 03/18/2022 1142   CHOL 130 02/09/2012 0000   TRIG 337.0 (H) 03/18/2022 1142   TRIG 142 02/09/2012 0000   HDL 62.00 03/18/2022 1142   CHOLHDL 3 03/18/2022 1142   VLDL 67.4 (H) 03/18/2022 1142   LDLCALC 63 12/29/2021 0952   LDLCALC 47 02/09/2012 0000   LDLDIRECT 83.0 03/18/2022 1142    Physical Exam:   VS:  BP (!) 144/84 Comment: right arm  Pulse 61   Ht '5\' 7"'$  (1.702 m)   Wt 225 lb (102.1 kg)   BMI 35.24 kg/m  , BMI Body mass index is 35.24 kg/m. GENERAL:  Well appearing HEENT: Pupils equal round and reactive, fundi not visualized, oral mucosa unremarkable NECK:  No jugular venous distention, waveform within normal limits, carotid upstroke brisk and symmetric, no bruits, no thyromegaly LYMPHATICS:  No cervical adenopathy LUNGS:  Clear to auscultation bilaterally HEART:  RRR.  PMI not displaced or sustained,S1 and S2 within normal limits, no S3, no S4, no clicks, no rubs, no murmurs ABD:  Flat, positive bowel sounds normal in frequency in  pitch, no bruits, no rebound, no guarding, no midline pulsatile mass, no hepatomegaly, no splenomegaly EXT:  2 plus pulses throughout, no edema, no cyanosis no clubbing SKIN:  No rashes no nodules NEURO:  Cranial nerves II through XII grossly intact, motor grossly intact throughout PSYCH:  Cognitively intact, oriented to person place and time   ASSESSMENT/PLAN:    HTN - BP not at goal <130/80. Management difficult due to multiple allergies and intolerances. Continue Valsartan '320mg'$  QD, Coreg '25mg'$  BID, Hydralazine '150mg'$  TID. Discussed increasing his Lasix from 3x per week to daily which he declines. Add Minoxidil 2.'5mg'$  QHS. Hesitant to use Spironolactone, HCTZ, Chlorthalidone due to previous intolerance and gout. Enrolled in research study today. Labs in one week: BMP, TSH, catecholamines, metanephrines, cortisol. Plan for renal artery duplex.  Morbid obesity - Weight loss via diet and exercise encouraged. Discussed the impact being overweight would have on cardiovascular risk.   S/p PPM -notes energy level somewhat improved since placement of PPM.  Follows with EP.  OSA - Did not tolerate CPAP due to allergies. Previously declined Inspire. Untreated OSA contributory to resistant HTN.  HLD -continue pravastatin.  Screening for Secondary Hypertension:     05/01/2022   11:07 AM  Causes  Drugs/Herbals Screened  Renovascular HTN Screened  Sleep Apnea Screened     - Comments Intolerant of CPAP  Thyroid Disease Screened  Pheochromocytoma Screened  Cushing's Syndrome Screened  Compliance Screened    Relevant Labs/Studies:    Latest Ref Rng & Units 03/18/2022   11:42 AM 02/26/2022    3:15 AM 02/25/2022    8:37 PM  Basic Labs  Sodium 135 - 145 mEq/L 139  139    Potassium  3.5 - 5.1 mEq/L 4.0  4.2    Creatinine 0.40 - 1.50 mg/dL 1.18  1.20  1.20        Latest Ref Rng & Units 08/26/2020    9:51 AM 03/24/2016   12:01 PM  Thyroid   TSH 0.35 - 4.50 uIU/mL 2.82  0.80                  04/30/2022   12:13 PM  Renovascular   Renal Artery [place] Completed Yes     he consents to be monitored in our remote patient monitoring program through [facility].  he will track his blood pressure twice daily and understands that these trends will help [place] to adjust his medications as needed prior to his next appointment.  he is not interested in enrolling in the PREP exercise and nutrition program through the [facility].     Disposition:    FU with PharmD monthly x 3 months and with Dr. Oval Linsey in 4 months.   Medication Adjustments/Labs and Tests Ordered: Current medicines are reviewed at length with the patient today.  Concerns regarding medicines are outlined above.  Orders Placed This Encounter  Procedures   Catecholamines, fractionated, plasma   Metanephrines, plasma   Basic metabolic panel   TSH   Cortisol   Cantril's Ladder Assessment   VAS US RENAL ARTERY DUPLEX   Meds ordered this encounter  Medications   minoxidil (LONITEN) 2.5 MG tablet    Sig: Take 1 tablet (2.5 mg total) by mouth at bedtime.    Dispense:  30 tablet    Refill:  2    Order Specific Question:   Supervising Provider    Answer:   Buford Dresser [[id number]]     Signed, Loel Dubonnet, NP  05/01/2022 11:08 AM    [place]

## 2022-04-30 ENCOUNTER — Ambulatory Visit (HOSPITAL_BASED_OUTPATIENT_CLINIC_OR_DEPARTMENT_OTHER): Payer: PPO | Admitting: Family

## 2022-04-30 VITALS — BP 144/84 | HR 61 | Ht 67.0 in | Wt 225.0 lb

## 2022-04-30 DIAGNOSIS — Z950 Presence of cardiac pacemaker: Secondary | ICD-10-CM

## 2022-04-30 DIAGNOSIS — G4733 Obstructive sleep apnea (adult) (pediatric): Secondary | ICD-10-CM

## 2022-04-30 DIAGNOSIS — Z006 Encounter for examination for normal comparison and control in clinical research program: Secondary | ICD-10-CM

## 2022-04-30 DIAGNOSIS — I10 Essential (primary) hypertension: Secondary | ICD-10-CM

## 2022-04-30 MED ORDER — MINOXIDIL 2.5 MG PO TABS: 2.5000 mg | ORAL_TABLET | Freq: Every day | ORAL | 2 refills | Status: DC

## 2022-04-30 NOTE — Patient Instructions (Addendum)
Medication Instructions:  Your physician has recommended you make the following change in your medication:   Start: Minoxidil 2.'5mg'$  daily at bedtime    Labwork: Please return for Lab work within one week for fasting BMP, TSH, Catecholamines, metanephrines, and cortisol. You may come to the...   [facility] (3rd floor) [address] [place], [place] [address]  Open: 8am-Noon and 1pm-4:30pm  Please ring the doorbell on the small table when you exit the elevator and the Lab Tech will come get you  [facility] [address] [place], [place], [place] [address] Open: 8am-1pm, then 2pm-4:30pm   [place]- Please see attached locations sheet stapled to your lab work with address and hours.     Testing/Procedures: Your physician has requested that you have a renal artery duplex. During this test, an ultrasound is used to evaluate blood flow to the kidneys. Allow one hour for this exam. Do not eat after midnight the day before and avoid carbonated beverages. Take your medications as you usually do.    Follow-Up: FOLLOW UP IN HTN CLINIC 06/04/22 AT 10AM WITH PHARMD AT [facility] OFFICE IN [facility]     Special Instructions:  DASH Eating Plan DASH stands for Dietary Approaches to Stop Hypertension. The DASH eating plan is a healthy eating plan that has been shown to: Reduce high blood pressure (hypertension). Reduce your risk for type 2 diabetes, heart disease, and stroke. Help with weight loss. What are tips for following this plan? Reading food labels Check food labels for the amount of salt (sodium) per serving. Choose foods with less than 5 percent of the Daily Value of sodium. Generally, foods with less than 300 milligrams (mg) of sodium per serving fit into this eating plan. To find whole grains, look for the word "whole" as the first word in the ingredient list. Shopping Buy products labeled as "low-sodium" or "no salt  added." Buy fresh foods. Avoid canned foods and pre-made or frozen meals. Cooking Avoid adding salt when cooking. Use salt-free seasonings or herbs instead of table salt or sea salt. Check with your health care provider or pharmacist before using salt substitutes. Do not fry foods. Cook foods using healthy methods such as baking, boiling, grilling, roasting, and broiling instead. Cook with heart-healthy oils, such as olive, canola, avocado, soybean, or sunflower oil. Meal planning  Eat a balanced diet that includes: 4 or more servings of fruits and 4 or more servings of vegetables each day. Try to fill one-half of your plate with fruits and vegetables. 6-8 servings of whole grains each day. Less than 6 oz (170 g) of lean meat, poultry, or fish each day. A 3-oz (85-g) serving of meat is about the same size as a deck of cards. One egg equals 1 oz (28 g). 2-3 servings of low-fat dairy each day. One serving is 1 cup (237 mL). 1 serving of nuts, seeds, or beans 5 times each week. 2-3 servings of heart-healthy fats. Healthy fats called omega-3 fatty acids are found in foods such as walnuts, flaxseeds, fortified milks, and eggs. These fats are also found in cold-water fish, such as sardines, salmon, and mackerel. Limit how much you eat of: Canned or prepackaged foods. Food that is high in trans fat, such as some fried foods. Food that is high in saturated fat, such as fatty meat. Desserts and other sweets, sugary drinks, and other foods with added sugar. Full-fat dairy products. Do not salt foods before eating. Do  not eat more than 4 egg yolks a week. Try to eat at least 2 vegetarian meals a week. Eat more home-cooked food and less restaurant, buffet, and fast food. Lifestyle When eating at a restaurant, ask that your food be prepared with less salt or no salt, if possible. If you drink alcohol: Limit how much you use to: 0-1 drink a day for women who are not pregnant. 0-2 drinks a day for  men. Be aware of how much alcohol is in your drink. In the [place] one drink equals one 12 oz bottle of beer (355 mL), one 5 oz glass of wine (148 mL), or one 1 oz glass of hard liquor (44 mL). General information Avoid eating more than 2,300 mg of salt a day. If you have hypertension, you may need to reduce your sodium intake to 1,500 mg a day. Work with your health care provider to maintain a healthy body weight or to lose weight. Ask what an ideal weight is for you. Get at least 30 minutes of exercise that causes your heart to beat faster (aerobic exercise) most days of the week. Activities may include walking, swimming, or biking. Work with your health care provider or dietitian to adjust your eating plan to your individual calorie needs. What foods should I eat? Fruits All fresh, dried, or frozen fruit. Canned fruit in natural juice (without added sugar). Vegetables Fresh or frozen vegetables (raw, steamed, roasted, or grilled). Low-sodium or reduced-sodium tomato and vegetable juice. Low-sodium or reduced-sodium tomato sauce and tomato paste. Low-sodium or reduced-sodium canned vegetables. Grains Whole-grain or whole-wheat bread. Whole-grain or whole-wheat pasta. Brown rice. [place]. Bulgur. Whole-grain and low-sodium cereals. Pita bread. Low-fat, low-sodium crackers. Whole-wheat flour tortillas. Meats and other proteins Skinless chicken or [place]. Ground chicken or [place]. Pork with fat trimmed off. Fish and seafood. Egg whites. Dried beans, peas, or lentils. Unsalted nuts, nut butters, and seeds. Unsalted canned beans. Lean cuts of beef with fat trimmed off. Low-sodium, lean precooked or cured meat, such as sausages or meat loaves. Dairy Low-fat (1%) or fat-free (skim) milk. Reduced-fat, low-fat, or fat-free cheeses. Nonfat, low-sodium ricotta or cottage cheese. Low-fat or nonfat yogurt. Low-fat, low-sodium cheese. Fats and oils Soft margarine without trans fats. Vegetable oil.  Reduced-fat, low-fat, or light mayonnaise and salad dressings (reduced-sodium). Canola, safflower, olive, avocado, soybean, and sunflower oils. Avocado. Seasonings and condiments Herbs. Spices. Seasoning mixes without salt. Other foods Unsalted popcorn and pretzels. Fat-free sweets. The items listed above may not be a complete list of foods and beverages you can eat. Contact a dietitian for more information. What foods should I avoid? Fruits Canned fruit in a light or heavy syrup. Fried fruit. Fruit in cream or butter sauce. Vegetables Creamed or fried vegetables. Vegetables in a cheese sauce. Regular canned vegetables (not low-sodium or reduced-sodium). Regular canned tomato sauce and paste (not low-sodium or reduced-sodium). Regular tomato and vegetable juice (not low-sodium or reduced-sodium). Angie Fava. Olives. Grains Baked goods made with fat, such as croissants, muffins, or some breads. Dry pasta or rice meal packs. Meats and other proteins Fatty cuts of meat. Ribs. Fried meat. Berniece Salines. Bologna, salami, and other precooked or cured meats, such as sausages or meat loaves. Fat from the back of a pig (fatback). Bratwurst. Salted nuts and seeds. Canned beans with added salt. Canned or smoked fish. Whole eggs or egg yolks. Chicken or [place] with skin. Dairy Whole or 2% milk, cream, and half-and-half. Whole or full-fat cream cheese. Whole-fat or sweetened yogurt. Full-fat cheese.  Nondairy creamers. Whipped toppings. Processed cheese and cheese spreads. Fats and oils Butter. Stick margarine. Lard. Shortening. Ghee. Bacon fat. Tropical oils, such as coconut, palm kernel, or palm oil. Seasonings and condiments Onion salt, garlic salt, seasoned salt, table salt, and sea salt. Worcestershire sauce. Tartar sauce. Barbecue sauce. Teriyaki sauce. Soy sauce, including reduced-sodium. Steak sauce. Canned and packaged gravies. Fish sauce. Oyster sauce. Cocktail sauce. Store-bought horseradish. Ketchup. Mustard.  Meat flavorings and tenderizers. Bouillon cubes. Hot sauces. Pre-made or packaged marinades. Pre-made or packaged taco seasonings. Relishes. Regular salad dressings. Other foods Salted popcorn and pretzels. The items listed above may not be a complete list of foods and beverages you should avoid. Contact a dietitian for more information. Where to find more information National Heart, Lung, and Blood Institute: https://wilson-eaton.com/ American Heart Association: www.heart.org Academy of Nutrition and Dietetics: www.eatright.[place]: www.kidney.org Summary The DASH eating plan is a healthy eating plan that has been shown to reduce high blood pressure (hypertension). It may also reduce your risk for type 2 diabetes, heart disease, and stroke. When on the DASH eating plan, aim to eat more fresh fruits and vegetables, whole grains, lean proteins, low-fat dairy, and heart-healthy fats. With the DASH eating plan, you should limit salt (sodium) intake to 2,300 mg a day. If you have hypertension, you may need to reduce your sodium intake to 1,500 mg a day. Work with your health care provider or dietitian to adjust your eating plan to your individual calorie needs. This information is not intended to replace advice given to you by your health care provider. Make sure you discuss any questions you have with your health care provider. Document Revised: 06/16/2019 Document Reviewed: 06/16/2019 Elsevier Patient Education  [place].

## 2022-04-30 NOTE — Research (Signed)
  Subject Name: Randy Kennedy met inclusion and exclusion criteria for the Virtual Care and Social Determinant Interventions for the management of hypertension trial.  The informed consent form, study requirements and expectations were reviewed with the subject by Dr. Oval Linsey and myself. The subject was given the opportunity to read the consent and ask questions. The subject verbalized understanding of the trial requirements.  All questions were addressed prior to the signing of the consent form. The subject agreed to participate in the trial and signed the informed consent. The informed consent was obtained prior to performance of any protocol-specific procedures for the subject.  A copy of the signed informed consent was given to the subject and a copy was placed in the subject's medical record.  Randy Kennedy was randomized to Group 1.

## 2022-05-01 ENCOUNTER — Encounter (HOSPITAL_BASED_OUTPATIENT_CLINIC_OR_DEPARTMENT_OTHER): Payer: Self-pay | Admitting: Family

## 2022-05-01 DIAGNOSIS — Z950 Presence of cardiac pacemaker: Secondary | ICD-10-CM | POA: Diagnosis not present

## 2022-05-01 DIAGNOSIS — I10 Essential (primary) hypertension: Secondary | ICD-10-CM | POA: Diagnosis not present

## 2022-05-01 DIAGNOSIS — G4733 Obstructive sleep apnea (adult) (pediatric): Secondary | ICD-10-CM | POA: Diagnosis not present

## 2022-05-07 LAB — CORTISOL: Cortisol: 6.4 ug/dL (ref 6.2–19.4)

## 2022-05-07 LAB — BASIC METABOLIC PANEL
BUN/Creatinine Ratio: 15 (ref 10–24)
BUN: 17 mg/dL (ref 8–27)
CO2: 26 mmol/L (ref 20–29)
Calcium: 9.4 mg/dL (ref 8.6–10.2)
Chloride: 100 mmol/L (ref 96–106)
Creatinine, Ser: 1.16 mg/dL (ref 0.76–1.27)
Glucose: 103 mg/dL — ABNORMAL HIGH (ref 70–99)
Potassium: 4.6 mmol/L (ref 3.5–5.2)
Sodium: 141 mmol/L (ref 134–144)
eGFR: 67 mL/min/{1.73_m2} (ref >59)

## 2022-05-07 LAB — CATECHOLAMINES, FRACTIONATED, PLASMA
Dopamine: <30 pg/mL (ref 0–48)
Epinephrine: 55 pg/mL (ref 0–62)
Norepinephrine: 675 pg/mL (ref 0–874)

## 2022-05-07 LAB — TSH: TSH: 2.69 u[IU]/mL (ref 0.450–4.500)

## 2022-05-07 LAB — METANEPHRINES, PLASMA
Metanephrine, Free: 89 pg/mL — ABNORMAL HIGH (ref 0.0–88.0)
Normetanephrine, Free: 109.5 pg/mL (ref 0.0–285.2)

## 2022-05-11 ENCOUNTER — Other Ambulatory Visit: Payer: Self-pay | Admitting: Family Medicine

## 2022-05-11 NOTE — Telephone Encounter (Signed)
Name of Medication: Hydrocodone-APAP Name of Pharmacy: [facility] Last Fill or Written Date and Quantity: 04/13/22, #75 Last Office Visit and Type: 03/18/22, CPE prt 2 Next Office Visit and Type: none Last Controlled Substance Agreement Date: 09/29/16 Last UDS: 03/10/19

## 2022-05-13 ENCOUNTER — Ambulatory Visit (INDEPENDENT_AMBULATORY_CARE_PROVIDER_SITE_OTHER): Payer: PPO | Admitting: Internal Medicine

## 2022-05-13 ENCOUNTER — Encounter: Payer: Self-pay | Admitting: Internal Medicine

## 2022-05-13 ENCOUNTER — Ambulatory Visit (INDEPENDENT_AMBULATORY_CARE_PROVIDER_SITE_OTHER)
Admission: RE | Admit: 2022-05-13 | Discharge: 2022-05-13 | Disposition: A | Discharge status: Home / Self Care | Payer: PPO | Source: Ambulatory Visit | Attending: Internal Medicine | Admitting: Internal Medicine

## 2022-05-13 VITALS — BP 124/76 | HR 62 | Temp 97.6°F | Ht 67.0 in | Wt 229.0 lb

## 2022-05-13 DIAGNOSIS — J181 Lobar pneumonia, unspecified organism: Secondary | ICD-10-CM

## 2022-05-13 DIAGNOSIS — R0602 Shortness of breath: Secondary | ICD-10-CM | POA: Diagnosis not present

## 2022-05-13 DIAGNOSIS — J22 Unspecified acute lower respiratory infection: Secondary | ICD-10-CM

## 2022-05-13 DIAGNOSIS — R059 Cough, unspecified: Secondary | ICD-10-CM | POA: Diagnosis not present

## 2022-05-13 HISTORY — DX: Lobar pneumonia, unspecified organism: J18.1

## 2022-05-13 MED ORDER — LEVOFLOXACIN 500 MG PO TABS: 500.0000 mg | ORAL_TABLET | Freq: Every day | ORAL | 0 refills | Status: DC

## 2022-05-13 MED ORDER — PREDNISONE 20 MG PO TABS: 40.0000 mg | ORAL_TABLET | Freq: Every day | ORAL | 0 refills | Status: DC

## 2022-05-13 NOTE — Assessment & Plan Note (Addendum)
CXR shows apparent RML infiltrate Will treat with levaquin '500mg'$  daily for 7 days Prednisone for 3 days Will need follow up Discussed ER evaluation if increased SOB

## 2022-05-13 NOTE — Progress Notes (Signed)
Subjective:    Patient ID: Randy Kennedy, male    DOB: June 03, 1951, 71 y.o.   MRN: [id number]  HPI Here due to respiratory infection  Started almost a week ago Off and on nausea and vomiting---?from mucus Also with head congestion Now feels in his chest Some chills Some fever--even yesterday Some SOB Went to urgent care---waited over an hour and didn't get seen Tried humidifier yesterday--some help  Coughing a lot--delsym helped Just clear mucus Some intermittent sore throat Head feels full---no true headache  No other meds Didn't test COVID Just one J&J vaccine  Current Outpatient Medications on File Prior to Visit  Medication Sig Dispense Refill   aspirin EC 81 MG tablet Take 81 mg by mouth daily.     carvedilol (COREG) 25 MG tablet Take 1 tablet (25 mg total) by mouth 2 (two) times daily with a meal. 180 tablet 0   colchicine 0.6 MG tablet Take 1 tablet (0.6 mg total) by mouth daily. 90 tablet 0   fluticasone (FLONASE) 50 MCG/ACT nasal spray Place 2 sprays into both nostrils daily. 16 g 11   furosemide (LASIX) 20 MG tablet Take 1 tablet (20 mg total) by mouth 3 (three) times a week. 39 tablet 3   gabapentin (NEURONTIN) 300 MG capsule TAKE ONE CAPSULE BY MOUTH TWICE DAILY and TAKE TWO CAPSULES EVERYDAY AT BEDTIME (Patient taking differently: See admin instructions. Take one capsule by mouth twice daily and take two capsules everyday at bedtime) 360 capsule 3   hydrALAZINE (APRESOLINE) 100 MG tablet TAKE 1 AND 1/2 TABLETS BY MOUTH THREE TIMES DAILY, MAY increase TO FOUR TIMES DAILY AS NEEDED FOR HIGH BLOOD PRESSURE 150 tablet 11   HYDROcodone-acetaminophen (NORCO) 10-325 MG tablet TAKE ONE TABLET BY MOUTH every SIX hours AS NEEDED 75 tablet 0   meloxicam (MOBIC) 15 MG tablet TAKE 1 TABLET BY MOUTH ONCE DAILY AS NEEDED FOR  GOUT  FLARE (Patient taking differently: Take 15 mg by mouth daily as needed (gout flare).) 30 tablet 0   minoxidil (LONITEN) 2.5 MG tablet Take 1  tablet (2.5 mg total) by mouth at bedtime. 30 tablet 2   nitroGLYCERIN (NITROSTAT) 0.4 MG SL tablet Place 1 tablet (0.4 mg total) under the tongue every 5 (five) minutes as needed for chest pain. 25 tablet 3   pravastatin (PRAVACHOL) 40 MG tablet TAKE ONE TABLET BY MOUTH ONCE DAILY (Patient taking differently: Take 40 mg by mouth daily.) 90 tablet 3   probenecid (BENEMID) 500 MG tablet TAKE 1/2 TABLET BY MOUTH TWICE DAILY (Patient taking differently: Take 250 mg by mouth 2 (two) times daily. TAKE 1/2 TABLET BY MOUTH TWICE DAILY) 90 tablet 3   valsartan (DIOVAN) 320 MG tablet Take 1 tablet (320 mg total) by mouth daily. 90 tablet 3   vitamin C (ASCORBIC ACID) 500 MG tablet Take 500 mg by mouth daily.     No current facility-administered medications on file prior to visit.    Allergies  Allergen Reactions   Amlodipine Rash    Rash in groin   Allopurinol Other (See Comments)    GI upset   Lisinopril-Hydrochlorothiazide     Burning in feet.   Procardia [Nifedipine] Other (See Comments)    Bradycardia, leg swelling   Spironolactone Other (See Comments)    malaise, dizziness   Clonidine Derivatives Rash   Doxazosin Mesylate Rash   Latex Rash   Nortriptyline Rash   Uloric [Febuxostat] Rash    Past Medical History:  Diagnosis Date  Allergic rhinitis    Arthritis    Chest pain    a. 11/2011 reportedly normal stress test performed in [place].   Chronic pain    L ankle and bilateral hips (remote fracture s/p surgeries), lower back (told has herniated disc)   Closed avulsion fracture of right talus 06/2016   COVID-19 06/2020   Dyspnea    Eczema    Erectile dysfunction 09/14/2012   [name] disease    Gout 2007   Hearing loss    otosclerosis   Heartburn    mild, controlled with pepcid   History of chicken pox    History of hepatitis B    as child, no sequelae   HLD (hyperlipidemia)    HTN (hypertension)    difficult to control - clonidine and beta blockers caused bradycardia    Morbid obesity ([place])    Motion sickness    Most moving vehicles   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 moderate, AHI 37.4 increased to 80 in REM, on BiPAP 14/10, 97% compliance >4 hrs (06/2013); b. Does not tolerate CPAP.   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 Treated with vicodin, prior tried fentanyl and butrans.   Systolic murmur    Wears dentures    Partial lower   Wears hearing aid in both ears    Does not wear all the time    Past Surgical History:  Procedure Laterality Date   CATARACT EXTRACTION W/PHACO Right 11/26/2020   Procedure: CATARACT EXTRACTION PHACO AND INTRAOCULAR LENS PLACEMENT ([facility]) RIGHT VIVITY TORIC LENS 7.17 00:49.6;  Surgeon: Birder Robson, MD;  Location: [facility];  Service: Ophthalmology;  Laterality: Right;  Latex Sleep Apnea   CATARACT EXTRACTION W/PHACO Left 12/10/2020   Procedure: CATARACT EXTRACTION PHACO AND INTRAOCULAR LENS PLACEMENT ([place]) LEFT VIVITY;  Surgeon: Birder Robson, MD;  Location: [facility];  Service: Ophthalmology;  Laterality: Left;  Latex Sleep apnea 5.37 00:32.7   COLONOSCOPY  02/2011   ext hem, benign polyp, rpt 5 yrs ([place])   COLONOSCOPY WITH PROPOFOL N/A 12/17/2020   multiple TA, ext hem, rpt 3 yrs ([place], Tally Due, MD)   LEG SURGERY  x5   left - after fall at work (3.5 stories)   [place]   jaw fracture - horse kick   MOHS SURGERY Right 07/03/2021   Ear   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   Procedure: PACEMAKER IMPLANT;  Surgeon: Constance Haw, MD;  Location: [facility];  Service: Cardiovascular;  Laterality: N/A;   TYMPANIC MEMBRANE REPAIR Left 1994   otosclerosis    Family History   Problem Relation Age of Onset   Cancer Father 20       prostate   Cancer Mother        rectal   Hypertension Mother    Cancer Paternal Uncle        prostate   Diabetes Neg Hx    Stroke Neg Hx    CAD Neg Hx     Social History   Socioeconomic History   Marital status: Married    Spouse name: Not on file   Number of children: Not on file   Years of  education: Not on file   Highest education level: Not on file  Occupational History   Not on file  Tobacco Use   Smoking status: Former    Packs/day: 1.00    Years: 4.00    Total pack years: 4.00    Types: Pipe, Cigarettes    Quit date: 07/27/1978    Years since quitting: 43.8   Smokeless tobacco: Never  Vaping Use   Vaping Use: Never used  Substance and Sexual Activity   Alcohol use: No    Alcohol/week: 0.0 standard drinks of alcohol   Drug use: No   Sexual activity: Not on file  Other Topics Concern   Not on file  Social History Narrative   Caffeine: 2 cans coke/day   Lives with wife and grown son    Occupation: retired, was Agricultural engineer.   On disability for chronic pain   Edu: MBA   Activity: no regular exercise   Deit: good water, fruits/vegetables daily   Social Determinants of Health   Financial Resource Strain: Low Risk  (10/02/2021)   Overall Financial Resource Strain (CARDIA)    Difficulty of Paying Living Expenses: Not hard at all  Food Insecurity: No Food Insecurity (10/02/2021)   Hunger Vital Sign    Worried About Running Out of Food in the Last Year: Never true    Ran Out of Food in the Last Year: Never true  Transportation Needs: No Transportation Needs (10/02/2021)   PRAPARE - Hydrologist (Medical): No    Lack of Transportation (Non-Medical): No  Physical Activity: Insufficiently Active (04/30/2022)   Exercise Vital Sign    Days of Exercise per Week: 2 days    Minutes of Exercise per Session: 50 min  Stress: No Stress Concern Present (10/02/2021)   [employer]    Feeling of Stress : Not at all  Social Connections: Moderately Isolated (10/02/2021)   Social Connection and Isolation Panel [NHANES]    Frequency of Communication with Friends and Family: More than three times a week    Frequency of Social Gatherings with Friends and Family: More than three times a week    Attends Religious Services: Never    Marine scientist or Organizations: No    Attends Archivist Meetings: Never    Marital Status: Married  Human resources officer Violence: Not At Risk (10/02/2021)   Humiliation, Afraid, Rape, and Kick questionnaire    Fear of Current or Ex-Partner: No    Emotionally Abused: No    Physically Abused: No    Sexually Abused: No   Review of Systems Able to eat--but not much No rash No tobacco    Objective:   Physical Exam Constitutional:      Appearance: Normal appearance.  HENT:     Head:     Comments: No sinus tenderness    Nose:     Comments: Moderate nasal congestion    Mouth/Throat:     Pharynx: No oropharyngeal exudate or posterior oropharyngeal erythema.  Pulmonary:     Effort: Pulmonary effort is normal.     Breath sounds: Normal breath sounds. No wheezing or rales.     Comments: Tight cough but no wheeze, etc Musculoskeletal:     Cervical back: Neck supple.  Lymphadenopathy:     Cervical: No cervical adenopathy.  Neurological:     Mental Status: He is alert.  Assessment & Plan:

## 2022-05-13 NOTE — Telephone Encounter (Signed)
ERx 

## 2022-05-13 NOTE — Assessment & Plan Note (Signed)
Sick for a week Feels it in chest and some tight cough (like asthmatic bronchitis) Has systemic symptoms Will check CXR

## 2022-05-14 ENCOUNTER — Telehealth: Payer: Self-pay | Admitting: Pharmacist

## 2022-05-14 NOTE — Telephone Encounter (Signed)
Opened in error

## 2022-05-20 ENCOUNTER — Ambulatory Visit (INDEPENDENT_AMBULATORY_CARE_PROVIDER_SITE_OTHER): Payer: PPO

## 2022-05-20 DIAGNOSIS — Z950 Presence of cardiac pacemaker: Secondary | ICD-10-CM

## 2022-05-20 DIAGNOSIS — I10 Essential (primary) hypertension: Secondary | ICD-10-CM

## 2022-05-20 DIAGNOSIS — G4733 Obstructive sleep apnea (adult) (pediatric): Secondary | ICD-10-CM | POA: Diagnosis not present

## 2022-05-21 ENCOUNTER — Other Ambulatory Visit: Payer: Self-pay | Admitting: Cardiology

## 2022-05-22 ENCOUNTER — Telehealth: Payer: Self-pay | Admitting: Cardiovascular Disease

## 2022-05-22 MED ORDER — CARVEDILOL 25 MG PO TABS: 25.0000 mg | ORAL_TABLET | Freq: Two times a day (BID) | ORAL | 0 refills | Status: DC

## 2022-05-22 MED ORDER — CARVEDILOL 25 MG PO TABS: 25.0000 mg | ORAL_TABLET | Freq: Two times a day (BID) | ORAL | 2 refills | Status: DC

## 2022-05-22 NOTE — Addendum Note (Signed)
Addended by: Wadie Lessen on: 05/22/2022 03:21 PM   Modules accepted: Orders

## 2022-05-22 NOTE — Telephone Encounter (Signed)
*  STAT* If patient is at the pharmacy, call can be transferred to refill team.   1. Which medications need to be refilled? (please list name of each medication and dose if known) carvedilol (COREG) 25 MG tablet  2. Which pharmacy/location (including street and city if local pharmacy) is medication to be sent to? [facility] - [place], [place] - [place] [address]  3. Do they need a 30 day or 90 day supply? [place]

## 2022-05-26 DIAGNOSIS — Z961 Presence of intraocular lens: Secondary | ICD-10-CM | POA: Diagnosis not present

## 2022-06-02 ENCOUNTER — Ambulatory Visit (HOSPITAL_BASED_OUTPATIENT_CLINIC_OR_DEPARTMENT_OTHER): Payer: PPO | Admitting: Cardiovascular Disease

## 2022-06-03 ENCOUNTER — Ambulatory Visit: Payer: PPO | Attending: Cardiology | Admitting: Cardiology

## 2022-06-03 ENCOUNTER — Encounter: Payer: Self-pay | Admitting: Cardiology

## 2022-06-03 VITALS — BP 116/72 | HR 60 | Ht 67.0 in | Wt 228.0 lb

## 2022-06-03 DIAGNOSIS — I10 Essential (primary) hypertension: Secondary | ICD-10-CM | POA: Diagnosis not present

## 2022-06-03 DIAGNOSIS — Z950 Presence of cardiac pacemaker: Secondary | ICD-10-CM | POA: Diagnosis not present

## 2022-06-03 DIAGNOSIS — R001 Bradycardia, unspecified: Secondary | ICD-10-CM

## 2022-06-03 LAB — CUP PACEART INCLINIC DEVICE CHECK
Battery Remaining Longevity: 120 mo
Battery Voltage: 3.02 V
Brady Statistic RA Percent Paced: 99.13 %
Brady Statistic RV Percent Paced: 2.4 %
Date Time Interrogation Session: 20231108152545
Implantable Lead Connection Status: 753985
Implantable Lead Connection Status: 753985
Implantable Lead Implant Date: 20230804
Implantable Lead Implant Date: 20230804
Implantable Lead Location: 753859
Implantable Lead Location: 753860
Implantable Pulse Generator Implant Date: 20230804
Lead Channel Impedance Value: 462.5 Ohm
Lead Channel Impedance Value: 525 Ohm
Lead Channel Pacing Threshold Amplitude: 0.5 V
Lead Channel Pacing Threshold Amplitude: 0.875 V
Lead Channel Pacing Threshold Pulse Width: 0.5 ms
Lead Channel Pacing Threshold Pulse Width: 0.5 ms
Lead Channel Sensing Intrinsic Amplitude: 12 mV
Lead Channel Sensing Intrinsic Amplitude: 2.1 mV
Lead Channel Setting Pacing Amplitude: 1.125
Lead Channel Setting Pacing Amplitude: 1.5 V
Lead Channel Setting Pacing Pulse Width: 0.5 ms
Lead Channel Setting Sensing Sensitivity: 2 mV
Pulse Gen Model: 2272
Pulse Gen Serial Number: 8101269

## 2022-06-03 NOTE — Progress Notes (Signed)
Electrophysiology Office Note   Date:  06/03/2022   ID:  Randy Kennedy, DOB [birth year], MRN [id number]  PCP:  [name], MD  Cardiologist:  Rockey Situ Primary Electrophysiologist:  Saadiq Poche Meredith Leeds, MD    Chief Complaint: Sick sinus syndrome   History of Present Illness: Randy Kennedy is a 71 y.o. male who is being seen today for the evaluation of sick sinus syndrome at the request of [name], MD. Presenting today for electrophysiology evaluation.  He has a history significant for hypertension, hyperlipidemia, obesity, sleep apnea.  He was hospitalized 8-23 with symptomatic bradycardia.  He continued to have conduction system disease.  He had a [employer] Jude dual-chamber pacemaker implanted 02/27/2022 for sick sinus syndrome.  Today, he denies symptoms of palpitations, chest pain, shortness of breath, orthopnea, PND, lower extremity edema, claudication, dizziness, presyncope, syncope, bleeding, or neurologic sequela. The patient is tolerating medications without difficulties.    Past Medical History:  Diagnosis Date   Allergic rhinitis    Arthritis    Chest pain    a. 11/2011 reportedly normal stress test performed in [place].   Chronic pain    L ankle and bilateral hips (remote fracture s/p surgeries), lower back (told has herniated disc)   Closed avulsion fracture of right talus 06/2016   COVID-19 06/2020   Dyspnea    Eczema    Erectile dysfunction 09/14/2012   [name] disease    Gout 2007   Hearing loss    otosclerosis   Heartburn    mild, controlled with pepcid   History of chicken pox    History of hepatitis B    as child, no sequelae   HLD (hyperlipidemia)    HTN (hypertension)    difficult to control - clonidine and beta blockers caused bradycardia   Morbid obesity ([place])    Motion sickness    Most moving vehicles   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 moderate, AHI 37.4 increased  to 80 in REM, on BiPAP 14/10, 97% compliance >4 hrs (06/2013); b. Does not tolerate CPAP.   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 Treated with vicodin, prior tried fentanyl and butrans.   Systolic murmur    Wears dentures    Partial lower   Wears hearing aid in both ears    Does not wear all the time   Past Surgical History:  Procedure Laterality Date   CATARACT EXTRACTION W/PHACO Right 11/26/2020   Procedure: CATARACT EXTRACTION PHACO AND INTRAOCULAR LENS PLACEMENT ([facility]) RIGHT VIVITY TORIC LENS 7.17 00:49.6;  Surgeon: Birder Robson, MD;  Location: [facility];  Service: Ophthalmology;  Laterality: Right;  Latex Sleep Apnea   CATARACT EXTRACTION W/PHACO Left 12/10/2020   Procedure: CATARACT EXTRACTION PHACO AND INTRAOCULAR LENS PLACEMENT ([place]) LEFT VIVITY;  Surgeon: Birder Robson, MD;  Location: [facility];  Service: Ophthalmology;  Laterality: Left;  Latex Sleep apnea 5.37 00:32.7   COLONOSCOPY  02/2011   ext hem, benign polyp, rpt 5 yrs ([place])   COLONOSCOPY WITH PROPOFOL N/A 12/17/2020   multiple TA, ext hem, rpt 3 yrs ([place], Tally Due, MD)   LEG SURGERY  x5   left - after fall at work (3.5 stories)  MANDIBLE SURGERY  1965   jaw fracture - horse kick   MOHS SURGERY Right 07/03/2021   Ear   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   Procedure: PACEMAKER IMPLANT;  Surgeon: Constance Haw, MD;  Location: [facility];  Service: Cardiovascular;  Laterality: N/A;   TYMPANIC MEMBRANE REPAIR Left 1994   otosclerosis     Current Outpatient Medications  Medication Sig Dispense Refill   aspirin EC 81 MG tablet Take 81 mg by mouth daily.     carvedilol (COREG) 25 MG tablet Take 1 tablet (25 mg total) by mouth 2 (two) times daily with a  meal. 180 tablet 2   colchicine 0.6 MG tablet Take 1 tablet (0.6 mg total) by mouth daily. 90 tablet 0   fluticasone (FLONASE) 50 MCG/ACT nasal spray Place 2 sprays into both nostrils daily. 16 g 11   furosemide (LASIX) 20 MG tablet Take 1 tablet (20 mg total) by mouth 3 (three) times a week. 39 tablet 3   gabapentin (NEURONTIN) 300 MG capsule TAKE ONE CAPSULE BY MOUTH TWICE DAILY and TAKE TWO CAPSULES EVERYDAY AT BEDTIME (Patient taking differently: See admin instructions. Take one capsule by mouth twice daily and take two capsules everyday at bedtime) 360 capsule 3   hydrALAZINE (APRESOLINE) 100 MG tablet TAKE 1 AND 1/2 TABLETS BY MOUTH THREE TIMES DAILY, MAY increase TO FOUR TIMES DAILY AS NEEDED FOR HIGH BLOOD PRESSURE 150 tablet 11   meloxicam (MOBIC) 15 MG tablet TAKE 1 TABLET BY MOUTH ONCE DAILY AS NEEDED FOR  GOUT  FLARE (Patient taking differently: Take 15 mg by mouth daily as needed (gout flare).) 30 tablet 0   minoxidil (LONITEN) 2.5 MG tablet Take 1 tablet (2.5 mg total) by mouth at bedtime. 30 tablet 2   nitroGLYCERIN (NITROSTAT) 0.4 MG SL tablet Place 1 tablet (0.4 mg total) under the tongue every 5 (five) minutes as needed for chest pain. 25 tablet 3   pravastatin (PRAVACHOL) 40 MG tablet TAKE ONE TABLET BY MOUTH ONCE DAILY (Patient taking differently: Take 40 mg by mouth daily.) 90 tablet 3   predniSONE (DELTASONE) 20 MG tablet Take 2 tablets (40 mg total) by mouth daily. 6 tablet 0   probenecid (BENEMID) 500 MG tablet TAKE 1/2 TABLET BY MOUTH TWICE DAILY (Patient taking differently: Take 250 mg by mouth 2 (two) times daily. TAKE 1/2 TABLET BY MOUTH TWICE DAILY) 90 tablet 3   valsartan (DIOVAN) 320 MG tablet Take 1 tablet (320 mg total) by mouth daily. 90 tablet 3   vitamin C (ASCORBIC ACID) 500 MG tablet Take 500 mg by mouth daily.     No current facility-administered medications for this visit.    Allergies:   Amlodipine, Allopurinol, Lisinopril-hydrochlorothiazide, Procardia  [nifedipine], Spironolactone, Clonidine derivatives, Doxazosin mesylate, Latex, Nortriptyline, and Uloric [febuxostat]   Social History:  The patient  reports that he quit smoking about 43 years ago. His smoking use included pipe and cigarettes. He has a 4.00 pack-year smoking history. He has never used smokeless tobacco. He reports that he does not drink alcohol and does not use drugs.   Family History:  The patient's family history includes Cancer in his mother and paternal uncle; Cancer (age of onset: 24) in his father; Hypertension in his mother.    ROS:  Please see the history of present illness.   Otherwise, review of systems is positive for none.   All other systems are reviewed and negative.    PHYSICAL EXAM: VS:  BP 116/72  Pulse 60   Ht '5\' 7"'$  (1.702 m)   Wt 228 lb (103.4 kg)   SpO2 95%   BMI 35.71 kg/m  , BMI Body mass index is 35.71 kg/m. GEN: Well nourished, well developed, in no acute distress  HEENT: normal  Neck: no JVD, carotid bruits, or masses Cardiac: RRR; no murmurs, rubs, or gallops,no edema  Respiratory:  clear to auscultation bilaterally, normal work of breathing GI: soft, nontender, nondistended, + BS MS: no deformity or atrophy  Skin: warm and dry, device pocket is well healed Neuro:  Strength and sensation are intact Psych: euthymic mood, full affect  EKG:  EKG is ordered today. Personal review of the ekg ordered shows atrial paced, right bundle branch block, rate 60  Device interrogation is reviewed today in detail.  See PaceArt for details.   Recent Labs: 03/18/2022: ALT 25; Hemoglobin 14.4; Platelets 154.0 05/01/2022: BUN 17; Creatinine, Ser 1.16; Potassium 4.6; Sodium 141; TSH 2.690    Lipid Panel     Component Value Date/Time   CHOL 180 03/18/2022 1142   CHOL 130 02/09/2012 0000   TRIG 337.0 (H) 03/18/2022 1142   TRIG 142 02/09/2012 0000   HDL 62.00 03/18/2022 1142   CHOLHDL 3 03/18/2022 1142   VLDL 67.4 (H) 03/18/2022 1142   LDLCALC 63  12/29/2021 0952   LDLCALC 47 02/09/2012 0000   LDLDIRECT 83.0 03/18/2022 1142     Wt Readings from Last 3 Encounters:  06/03/22 228 lb (103.4 kg)  05/13/22 229 lb (103.9 kg)  04/30/22 225 lb (102.1 kg)      Other studies Reviewed: Additional studies/ records that were reviewed today include: TTE 02/26/22  Review of the above records today demonstrates:   1. Left ventricular ejection fraction, by estimation, is 60 to 65%. The  left ventricle has normal function. The left ventricle has no regional  wall motion abnormalities. Left ventricular diastolic parameters are  consistent with Grade II diastolic  dysfunction (pseudonormalization).   2. Right ventricular systolic function is normal. The right ventricular  size is moderately enlarged. There is normal pulmonary artery systolic  pressure.   3. Left atrial size was moderately dilated.   4. Right atrial size was severely dilated.   5. The mitral valve is normal in structure. No evidence of mitral valve  regurgitation. No evidence of mitral stenosis.   6. The aortic valve is tricuspid. Aortic valve regurgitation is trivial.  Aortic valve sclerosis/calcification is present, without any evidence of  aortic stenosis.   7. The inferior vena cava is normal in size with greater than 50%  respiratory variability, suggesting right atrial pressure of 3 mmHg.    ASSESSMENT AND PLAN:  1.  Sinus syndrome: Status post Abbott dual-chamber pacemaker implanted 02/27/2022.  Device functioning appropriately.  No changes.  2.  Hypertension: Currently well controlled  Current medicines are reviewed at length with the patient today.   The patient does not have concerns regarding his medicines.  The following changes were made today:  none  Labs/ tests ordered today include:  Orders Placed This Encounter  Procedures   CUP Glide   EKG 12-Lead     Disposition:   FU with Shaquelle Hernon 1 year  Signed, Anasia Agro Meredith Leeds,  MD  06/03/2022 3:42 PM     [facility] [address] [place] [address] [phone] (office) [phone] (fax)

## 2022-06-04 ENCOUNTER — Ambulatory Visit: Payer: PPO | Attending: Cardiovascular Disease | Admitting: Pharmacist Clinician (PhC)/ Clinical Pharmacy Specialist

## 2022-06-04 ENCOUNTER — Encounter: Payer: Self-pay | Admitting: Pharmacist Clinician (PhC)/ Clinical Pharmacy Specialist

## 2022-06-04 VITALS — BP 128/75 | HR 60

## 2022-06-04 DIAGNOSIS — I10 Essential (primary) hypertension: Secondary | ICD-10-CM | POA: Diagnosis not present

## 2022-06-04 NOTE — Patient Instructions (Addendum)
Return for a a follow up appointment December 18 at 10:30 am   Check your blood pressure at home daily (if able) and keep record of the readings.  Take your BP meds as follows:  Continue with your current medications  Your cuff reads about 20-25 points higher on the top number and 10 points higher on the bottom.  You can mentally subtract from your reading to get an approximation of your pressure.    Bring all of your meds, your BP cuff and your record of home blood pressures to your next appointment.  Exercise as you're able, try to walk approximately 30 minutes per day.  Keep salt intake to a minimum, especially watch canned and prepared boxed foods.  Eat more fresh fruits and vegetables and fewer canned items.  Avoid eating in fast food restaurants.    HOW TO TAKE YOUR BLOOD PRESSURE: Rest 5 minutes before taking your blood pressure.  Don't smoke or drink caffeinated beverages for at least 30 minutes before. Take your blood pressure before (not after) you eat. Sit comfortably with your back supported and both feet on the floor (don't cross your legs). Elevate your arm to heart level on a table or a desk. Use the proper sized cuff. It should fit smoothly and snugly around your bare upper arm. There should be enough room to slip a fingertip under the cuff. The bottom edge of the cuff should be 1 inch above the crease of the elbow. Ideally, take 3 measurements at one sitting and record the average.

## 2022-06-04 NOTE — Progress Notes (Signed)
06/04/2022 Randy Kennedy 1951-04-01 [id number]   HPI:  Randy Kennedy is a 71 y.o. male patient of Dr Curt Bears with a PMH below who presents today for advanced hypertension clinic follow up.  Patient was referred to our clinic by Tommye Standard PA.  He sees the EP team for sick sinus syndrome.  He was seen by Laurann Montana last month, at which time his BP was noted to be 144/84.  Patient reported he was first diagnosed about 20 years ago and really didn't have problems with overly high readings until the past 5 or so years.  He does have a history of multiple medication intolerances, but is currently tolerating a combination of valsartan, carvedilol, hydralazine and minoxidil.    Today he returns for follow up.  Reports that home readings have continued to be elevated (see below), but when he was at the [facility] office yesterday for a pacemaker check, it was noted to be 116/72.  He is tolerating his current medications without any concerns, and has an alarm to remind him about the mid-day hydralazine dose.    Past Medical History: Sick sinus syndrome [employer] pacemaker 8/23  hyperlipidemia LDL on pravastatin  OSA Did not tolerate CPAP  obesity BMI 35.24     Blood Pressure Goal:  130/80  Current Medications: valsartan 320 mg qd - am, carvedilol 25 mg bid, hydralazine 150 mg tid, minoxidil 2.5 mg qhs     Family Hx:   parents both with hypertension, father lived to 38, mom had heart issues in her 36's; 2 sisters with hypertension, 2 without  Social Hx: no current tobacco use (not since [employer] days), no alcohol, drinks hot tea and coffee  Diet: mostly home cooked meals, does add some salt (wife yells at him)good mix of fresh/frozen veggies; protein is lean beef, some chicken and fish     Exercise: no regular other than yard work  Home BP readings:   NOTE: home cuff reading 25/10 points higher than in office readings.  Last 2 in office readings (yesterday and today) both <  130/80   AM: 34 readings average 157/89 HR 72  PM: 33 readings average 178/102 HR 74  Intolerances: Amlodipine 2022 - rash in groin Lisinopril-HCTZ - burning in feet Nifedipine 2014- bradycardia, leg swelling Spironolactone 2018- malaise, dizziness Clonidine - bradycardia Metoprolol- bradycardia Imdur 2019- headache Chlorthalidone 2014-2018- gout Doxazosin - rash, itching    Labs: 05/01/22:  Na 141, K 4.6, Glu 103, BUN 17, SCr 1.16, GFR 67   Wt Readings from Last 3 Encounters:  06/03/22 228 lb (103.4 kg)  05/13/22 229 lb (103.9 kg)  04/30/22 225 lb (102.1 kg)   BP Readings from Last 3 Encounters:  06/04/22 128/75  06/03/22 116/72  05/13/22 124/76   Pulse Readings from Last 3 Encounters:  06/04/22 60  06/03/22 60  05/13/22 62    Current Outpatient Medications  Medication Sig Dispense Refill   aspirin EC 81 MG tablet Take 81 mg by mouth daily.     carvedilol (COREG) 25 MG tablet Take 1 tablet (25 mg total) by mouth 2 (two) times daily with a meal. 180 tablet 2   colchicine 0.6 MG tablet Take 1 tablet (0.6 mg total) by mouth daily. 90 tablet 0   furosemide (LASIX) 20 MG tablet Take 1 tablet (20 mg total) by mouth 3 (three) times a week. 39 tablet 3   gabapentin (NEURONTIN) 300 MG capsule TAKE ONE CAPSULE BY MOUTH TWICE DAILY  and TAKE TWO CAPSULES EVERYDAY AT BEDTIME (Patient taking differently: See admin instructions. Take one capsule by mouth twice daily and take two capsules everyday at bedtime) 360 capsule 3   hydrALAZINE (APRESOLINE) 100 MG tablet TAKE 1 AND 1/2 TABLETS BY MOUTH THREE TIMES DAILY, MAY increase TO FOUR TIMES DAILY AS NEEDED FOR HIGH BLOOD PRESSURE 150 tablet 11   meloxicam (MOBIC) 15 MG tablet TAKE 1 TABLET BY MOUTH ONCE DAILY AS NEEDED FOR  GOUT  FLARE (Patient taking differently: Take 15 mg by mouth daily as needed (gout flare).) 30 tablet 0   minoxidil (LONITEN) 2.5 MG tablet Take 1 tablet (2.5 mg total) by mouth at bedtime. 30 tablet 2   nitroGLYCERIN  (NITROSTAT) 0.4 MG SL tablet Place 1 tablet (0.4 mg total) under the tongue every 5 (five) minutes as needed for chest pain. 25 tablet 3   pravastatin (PRAVACHOL) 40 MG tablet TAKE ONE TABLET BY MOUTH ONCE DAILY (Patient taking differently: Take 40 mg by mouth daily.) 90 tablet 3   predniSONE (DELTASONE) 20 MG tablet Take 2 tablets (40 mg total) by mouth daily. 6 tablet 0   probenecid (BENEMID) 500 MG tablet TAKE 1/2 TABLET BY MOUTH TWICE DAILY (Patient taking differently: Take 250 mg by mouth 2 (two) times daily. TAKE 1/2 TABLET BY MOUTH TWICE DAILY) 90 tablet 3   valsartan (DIOVAN) 320 MG tablet Take 1 tablet (320 mg total) by mouth daily. 90 tablet 3   vitamin C (ASCORBIC ACID) 500 MG tablet Take 500 mg by mouth daily.     fluticasone (FLONASE) 50 MCG/ACT nasal spray Place 2 sprays into both nostrils daily. 16 g 11   No current facility-administered medications for this visit.    Allergies  Allergen Reactions   Amlodipine Rash    Rash in groin   Allopurinol Other (See Comments)    GI upset   Lisinopril-Hydrochlorothiazide     Burning in feet.   Procardia [Nifedipine] Other (See Comments)    Bradycardia, leg swelling   Spironolactone Other (See Comments)    malaise, dizziness   Clonidine Derivatives Rash   Doxazosin Mesylate Rash   Latex Rash   Nortriptyline Rash   Uloric [Febuxostat] Rash    Past Medical History:  Diagnosis Date   Allergic rhinitis    Arthritis    Chest pain    a. 11/2011 reportedly normal stress test performed in [place].   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 moderate, AHI 37.4 increased to 80 in REM, on BiPAP 14/10, 97% compliance >4 hrs (06/2013); b. Does not tolerate CPAP.   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 Treated with vicodin, prior tried fentanyl and butrans.   Systolic murmur    Wears dentures    Partial lower   Wears hearing aid in both ears    Does not wear all the time    Blood pressure 128/75, pulse 60.     HTN (hypertension) Patient with resistant hypertension, currently doing well on combination of 4 medications.  No issues with compliance.  In office reading read 25/10 points lower than his study BP cuff (Group 1).  Checked home cuff on both arms as well as forearm, but was unable to reproduce office reading.  Advised that he continue to check reading at home, as part of the study, but keep in mind that they are not truly representative.  He is to continue with current home medications and we will see him back in a month for follow up.     Tommy Medal PharmD CPP [facility] [address] [place], [place] [address] [phone]

## 2022-06-04 NOTE — Assessment & Plan Note (Signed)
Patient with resistant hypertension, currently doing well on combination of 4 medications.  No issues with compliance.  In office reading read 25/10 points lower than his study BP cuff (Group 1).  Checked home cuff on both arms as well as forearm, but was unable to reproduce office reading.  Advised that he continue to check reading at home, as part of the study, but keep in mind that they are not truly representative.  He is to continue with current home medications and we will see him back in a month for follow up.

## 2022-06-11 ENCOUNTER — Ambulatory Visit (INDEPENDENT_AMBULATORY_CARE_PROVIDER_SITE_OTHER): Payer: PPO

## 2022-06-11 DIAGNOSIS — I495 Sick sinus syndrome: Secondary | ICD-10-CM

## 2022-06-11 LAB — CUP PACEART REMOTE DEVICE CHECK
Battery Remaining Longevity: 120 mo
Battery Remaining Percentage: 95.5 %
Battery Voltage: 3.02 V
Brady Statistic AP VP Percent: 2.5 %
Brady Statistic AP VS Percent: 97 %
Brady Statistic AS VP Percent: 1 %
Brady Statistic AS VS Percent: 1 %
Brady Statistic RA Percent Paced: 99 %
Brady Statistic RV Percent Paced: 2.5 %
Date Time Interrogation Session: 20231116020023
Implantable Lead Connection Status: 753985
Implantable Lead Connection Status: 753985
Implantable Lead Implant Date: 20230804
Implantable Lead Implant Date: 20230804
Implantable Lead Location: 753859
Implantable Lead Location: 753860
Implantable Pulse Generator Implant Date: 20230804
Lead Channel Impedance Value: 450 Ohm
Lead Channel Impedance Value: 530 Ohm
Lead Channel Pacing Threshold Amplitude: 0.625 V
Lead Channel Pacing Threshold Amplitude: 0.875 V
Lead Channel Pacing Threshold Pulse Width: 0.5 ms
Lead Channel Pacing Threshold Pulse Width: 0.5 ms
Lead Channel Sensing Intrinsic Amplitude: 12 mV
Lead Channel Sensing Intrinsic Amplitude: 2.3 mV
Lead Channel Setting Pacing Amplitude: 1.125
Lead Channel Setting Pacing Amplitude: 1.625
Lead Channel Setting Pacing Pulse Width: 0.5 ms
Lead Channel Setting Sensing Sensitivity: 2 mV
Pulse Gen Model: 2272
Pulse Gen Serial Number: 8101269

## 2022-07-01 NOTE — Progress Notes (Signed)
Remote pacemaker transmission.   

## 2022-07-09 ENCOUNTER — Telehealth: Payer: Self-pay

## 2022-07-09 ENCOUNTER — Other Ambulatory Visit (HOSPITAL_BASED_OUTPATIENT_CLINIC_OR_DEPARTMENT_OTHER): Payer: Self-pay | Admitting: Family

## 2022-07-09 NOTE — Progress Notes (Addendum)
Chronic Care Management Pharmacy Assistant   Name: Randy Kennedy  MRN: [id number] DOB: 04/16/1951  Reason for Encounter: CCM (Medication Adherence and Delivery Coordination)   Recent office visits:  None since last CCM contact  Recent consult visits:  06/03/22 Allegra Lai, MD (Cardiology): d/c clonidine, doxazosin  05/13/22 Viviana Simpler, MD OV: Lower Respiratory Infection; Start: Levofloxacin 500 mg Start; Prednisone 40 mg  04/30/22 Laurann Montana, NP (Cardiology) HTN Lab Results: "Metanephrines/catecholamines, cortisol unremarkable. Normal kidneys, electrolytes, thyroid. Good result" Start: Minoxidil 2.5 mg FU 4 months  Hospital visits:  None since last CCM contact  Medications: Outpatient Encounter Medications as of 07/09/2022  Medication Sig Note   aspirin EC 81 MG tablet Take 81 mg by mouth daily.    carvedilol (COREG) 25 MG tablet Take 1 tablet (25 mg total) by mouth 2 (two) times daily with a meal.    colchicine 0.6 MG tablet Take 1 tablet (0.6 mg total) by mouth daily.    fluticasone (FLONASE) 50 MCG/ACT nasal spray Place 2 sprays into both nostrils daily.    furosemide (LASIX) 20 MG tablet Take 1 tablet (20 mg total) by mouth 3 (three) times a week.    gabapentin (NEURONTIN) 300 MG capsule TAKE ONE CAPSULE BY MOUTH TWICE DAILY and TAKE TWO CAPSULES EVERYDAY AT BEDTIME (Patient taking differently: See admin instructions. Take one capsule by mouth twice daily and take two capsules everyday at bedtime)    hydrALAZINE (APRESOLINE) 100 MG tablet TAKE 1 AND 1/2 TABLETS BY MOUTH THREE TIMES DAILY, MAY increase TO FOUR TIMES DAILY AS NEEDED FOR HIGH BLOOD PRESSURE    meloxicam (MOBIC) 15 MG tablet TAKE 1 TABLET BY MOUTH ONCE DAILY AS NEEDED FOR  GOUT  FLARE (Patient taking differently: Take 15 mg by mouth daily as needed (gout flare).)    minoxidil (LONITEN) 2.5 MG tablet TAKE ONE TABLET BY MOUTH EVERYDAY AT BEDTIME    nitroGLYCERIN (NITROSTAT) 0.4 MG SL tablet Place 1  tablet (0.4 mg total) under the tongue every 5 (five) minutes as needed for chest pain. 02/25/2022: On hand, hasn't needed    pravastatin (PRAVACHOL) 40 MG tablet TAKE ONE TABLET BY MOUTH ONCE DAILY (Patient taking differently: Take 40 mg by mouth daily.)    predniSONE (DELTASONE) 20 MG tablet Take 2 tablets (40 mg total) by mouth daily.    probenecid (BENEMID) 500 MG tablet TAKE 1/2 TABLET BY MOUTH TWICE DAILY (Patient taking differently: Take 250 mg by mouth 2 (two) times daily. TAKE 1/2 TABLET BY MOUTH TWICE DAILY)    valsartan (DIOVAN) 320 MG tablet Take 1 tablet (320 mg total) by mouth daily.    vitamin C (ASCORBIC ACID) 500 MG tablet Take 500 mg by mouth daily.    No facility-administered encounter medications on file as of 07/09/2022.   BP Readings from Last 3 Encounters:  06/04/22 128/75  06/03/22 116/72  05/13/22 124/76    Pulse Readings from Last 3 Encounters:  06/04/22 60  06/03/22 60  05/13/22 62    Lab Results  Component Value Date/Time   HGBA1C 5.6 10/24/2020 08:54 AM   HGBA1C 5.3 08/22/2019 08:01 AM   Lab Results  Component Value Date   CREATININE 1.16 05/01/2022   BUN 17 05/01/2022   GFR 62.18 03/18/2022   GFRNONAA >60 02/26/2022   GFRAA >60 10/04/2017   NA 141 05/01/2022   K 4.6 05/01/2022   CALCIUM 9.4 05/01/2022   CO2 26 05/01/2022     Last adherence delivery date: 04/23/2022  Patient is due for next adherence delivery on: 07/22/2022  Multiple attempts made to reach patient. Unsuccessful outreach. Will refill based off of last adherence fill.   This delivery to include: Vials  90 Days  Furosemide 20 mg - 1 tab three days a week Carvedilol 25 mg - 1 tab twice daily Pravastatin 40 mg  - 1 tablet qpm Gabapentin '300mg'$  take 1 tablet at breakfast 1 tablet evening meal, 2 tablets at bedtime Valsartan 320 mg take  1 tablet at breakfast Colchicine 0.'6mg'$  take 1 tablet at breakfast  Fluticasone nasal spray 50 mcg 2 sprays in each nostril daily Minoxidil  2.5 mg daily HS Hydralazine 100 mg - 1.5 tab 3x daily  Is patient in packaging No  No refill request needed.  Delivery scheduled for 07/22/2022. Unable to speak with patient to confirm date.    Recent blood pressure readings are as follows: Unable to speak with patient  Annual wellness visit in last year? Yes 10/02/21 Most Recent BP reading: 154/84 on 03/18/22  Cycle dispensing form sent to [facility], CPP for review.  Next CCM appointment: None scheduled  Charlene Brooke, CPP notified  Marijean Niemann, [place] Clinical Pharmacy Assistant [phone]

## 2022-07-09 NOTE — Telephone Encounter (Signed)
Rx request sent to pharmacy.  

## 2022-07-13 ENCOUNTER — Ambulatory Visit: Payer: PPO | Attending: Cardiology | Admitting: Student

## 2022-07-13 VITALS — BP 144/85 | HR 63

## 2022-07-13 DIAGNOSIS — I10 Essential (primary) hypertension: Secondary | ICD-10-CM

## 2022-07-13 MED ORDER — SPIRONOLACTONE 25 MG PO TABS: 12.5000 mg | ORAL_TABLET | Freq: Every day | ORAL | 1 refills | Status: DC

## 2022-07-13 NOTE — Assessment & Plan Note (Addendum)
Assessment: BP is uncontrolled (goal <130/80) in office BP 144/85 mmHg with heart rate of 63  Currently on Coreg 25 mg twice daily, Hydralazine 150 three times daily, minoxidil 2.5 mg daily, Diovan 320 mg daily, furosemide 20 mg 3 times per week Current hydralazine dose is inappropriate (Maximum dose: 300 mg/day in divided doses per product monograph) Discussed the past BP intolerances and allergies in detail  Takes medications regularly and tolerates them well without any side effects  Denies any SOB, palpitation, chest pain, headaches,or swelling Reiterated the importance of regular exercise and low salt diet   Plan:  Start taking spironolactone 12.5 mg daily  Decrease hydralazine from 150 mg three times daily to 100 mg three times daily   Continue taking rest of the BP same as before Patient to keep record of BP readings with heart rate and report to [place] at the next visit Patient to reduce salt intake and start 5-10 min twice daily chair yoga  Patient to see PharmD in 4-5 weeks for follow up  Follow up in 1 week to assess tolerability on spironolactone (intolerance reported in the past)  Follow up lab(s): BMP in 1 week after starting spironolactone

## 2022-07-13 NOTE — Patient Instructions (Signed)
Changes made by your pharmacist Cammy Copa, PharmD at today's visit:    Instructions/Changes  (what do you need to do) Your Notes  (what you did and when you did it)  Continue taking Coreg 25 mg twice daily minoxidil 2.5 mg daily, frusemide 20 mg 3 times per week    2. Take hydralazine 1 tablet 3 times instead of 1.5 tablet three times daily    3. Start taking spironolactone 12.5 mg daily     Bring all of your meds, your BP cuff and your record of home blood pressures to your next appointment.    HOW TO TAKE YOUR BLOOD PRESSURE AT HOME  Rest 5 minutes before taking your blood pressure.  Don't smoke or drink caffeinated beverages for at least 30 minutes before. Take your blood pressure before (not after) you eat. Sit comfortably with your back supported and both feet on the floor (don't cross your legs). Elevate your arm to heart level on a table or a desk. Use the proper sized cuff. It should fit smoothly and snugly around your bare upper arm. There should be enough room to slip a fingertip under the cuff. The bottom edge of the cuff should be 1 inch above the crease of the elbow. Ideally, take 3 measurements at one sitting and record the average.  Important lifestyle changes to control high blood pressure  Intervention  Effect on the BP  Lose extra pounds and watch your waistline Weight loss is one of the most effective lifestyle changes for controlling blood pressure. If you're overweight or obese, losing even a small amount of weight can help reduce blood pressure. Blood pressure might go down by about 1 millimeter of mercury (mm Hg) with each kilogram (about 2.2 pounds) of weight lost.  Exercise regularly As a general goal, aim for at least 30 minutes of moderate physical activity every day. Regular physical activity can lower high blood pressure by about 5 to 8 mm Hg.  Eat a healthy diet Eating a diet rich in whole grains, fruits, vegetables, and low-fat dairy products and low  in saturated fat and cholesterol. A healthy diet can lower high blood pressure by up to 11 mm Hg.  Reduce salt (sodium) in your diet Even a small reduction of sodium in the diet can improve heart health and reduce high blood pressure by about 5 to 6 mm Hg.  Limit alcohol One drink equals 12 ounces of beer, 5 ounces of wine, or 1.5 ounces of 80-proof liquor.  Limiting alcohol to less than one drink a day for women or two drinks a day for men can help lower blood pressure by about 4 mm Hg.   If you have any questions or concerns please use My Chart to send questions or call the office at [phone]
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
## 2022-07-14 ENCOUNTER — Other Ambulatory Visit: Payer: Self-pay | Admitting: Family Medicine

## 2022-07-14 NOTE — Telephone Encounter (Signed)
Colchicine Last filled:  04/30/22, #90 Last OV:  03/18/22, AWV Next OV:  none

## 2022-07-22 ENCOUNTER — Other Ambulatory Visit: Payer: Self-pay | Admitting: Pharmacist

## 2022-07-22 ENCOUNTER — Telehealth: Payer: Self-pay | Admitting: Pharmacist

## 2022-07-22 DIAGNOSIS — I10 Essential (primary) hypertension: Secondary | ICD-10-CM

## 2022-07-22 NOTE — Telephone Encounter (Signed)
Call to follow up on spironolactone.  Patient states he is tolerating spironolactone 12.5 mg well without any side effects, his home BP still staying in 150/90 range. Will be going for BMP either today or tomorrow if K level and renal function still WNL may consider increasing spironolactone to 25 mg daily

## 2022-07-28 ENCOUNTER — Other Ambulatory Visit: Payer: Self-pay | Admitting: Physical Medicine & Rehabilitation

## 2022-07-28 DIAGNOSIS — M9931 Osseous stenosis of neural canal of cervical region: Secondary | ICD-10-CM | POA: Diagnosis not present

## 2022-07-28 DIAGNOSIS — M542 Cervicalgia: Secondary | ICD-10-CM | POA: Diagnosis not present

## 2022-07-28 DIAGNOSIS — G8929 Other chronic pain: Secondary | ICD-10-CM

## 2022-07-28 DIAGNOSIS — M48062 Spinal stenosis, lumbar region with neurogenic claudication: Secondary | ICD-10-CM | POA: Diagnosis not present

## 2022-07-28 DIAGNOSIS — M5412 Radiculopathy, cervical region: Secondary | ICD-10-CM | POA: Diagnosis not present

## 2022-07-28 DIAGNOSIS — M5442 Lumbago with sciatica, left side: Secondary | ICD-10-CM

## 2022-07-29 ENCOUNTER — Encounter (HOSPITAL_BASED_OUTPATIENT_CLINIC_OR_DEPARTMENT_OTHER): Payer: Self-pay

## 2022-07-29 ENCOUNTER — Encounter: Payer: Self-pay | Admitting: Family Medicine

## 2022-07-29 DIAGNOSIS — I10 Essential (primary) hypertension: Secondary | ICD-10-CM

## 2022-07-30 MED ORDER — SPIRONOLACTONE 25 MG PO TABS: 12.5000 mg | ORAL_TABLET | Freq: Every day | ORAL | 3 refills | Status: DC

## 2022-07-30 MED ORDER — VALSARTAN 320 MG PO TABS: 320.0000 mg | ORAL_TABLET | Freq: Every day | ORAL | 3 refills | Status: DC

## 2022-07-30 MED ORDER — FUROSEMIDE 20 MG PO TABS: 20.0000 mg | ORAL_TABLET | ORAL | 3 refills | Status: DC

## 2022-07-30 MED ORDER — PRAVASTATIN SODIUM 40 MG PO TABS: 40.0000 mg | ORAL_TABLET | Freq: Every day | ORAL | 3 refills | Status: DC

## 2022-07-30 MED ORDER — NITROGLYCERIN 0.4 MG SL SUBL: 0.4000 mg | SUBLINGUAL_TABLET | SUBLINGUAL | 4 refills | Status: DC | PRN

## 2022-07-30 MED ORDER — MINOXIDIL 2.5 MG PO TABS: ORAL_TABLET | ORAL | 3 refills | Status: DC

## 2022-07-30 MED ORDER — HYDRALAZINE HCL 100 MG PO TABS: ORAL_TABLET | ORAL | 3 refills | Status: DC

## 2022-07-30 MED ORDER — CARVEDILOL 25 MG PO TABS: 25.0000 mg | ORAL_TABLET | Freq: Two times a day (BID) | ORAL | 3 refills | Status: DC

## 2022-07-31 ENCOUNTER — Telehealth: Payer: Self-pay

## 2022-07-31 NOTE — Telephone Encounter (Signed)
Device clearance given for MRI. Faxed to [facility] on 07/31/22.  See scanned in record to [employer].

## 2022-08-03 DIAGNOSIS — I10 Essential (primary) hypertension: Secondary | ICD-10-CM | POA: Diagnosis not present

## 2022-08-03 LAB — BASIC METABOLIC PANEL
BUN/Creatinine Ratio: 15 (ref 10–24)
BUN: 16 mg/dL (ref 8–27)
CO2: 22 mmol/L (ref 20–29)
Calcium: 9.4 mg/dL (ref 8.6–10.2)
Chloride: 101 mmol/L (ref 96–106)
Creatinine, Ser: 1.09 mg/dL (ref 0.76–1.27)
Glucose: 112 mg/dL — ABNORMAL HIGH (ref 70–99)
Potassium: 4 mmol/L (ref 3.5–5.2)
Sodium: 139 mmol/L (ref 134–144)
eGFR: 73 mL/min/{1.73_m2} (ref >59)

## 2022-08-04 NOTE — Telephone Encounter (Signed)
K level and renal function WNL patient informed via phone. Reports BP is still in 150/90 range, advice to increase spironolactone dose from 12.5 mg daily to 25 mg daily  Follow up scheduled in 1 week with PharmD

## 2022-08-07 ENCOUNTER — Encounter: Payer: Self-pay | Admitting: Family Medicine

## 2022-08-07 ENCOUNTER — Other Ambulatory Visit: Payer: Self-pay | Admitting: Internal Medicine

## 2022-08-10 NOTE — Telephone Encounter (Signed)
Message from pt via pharmacy: I have had to use one for gout flareup.  I have one left.  The Pharmacy list would not allow me to choose [facility] [place], but this is my preference.   Prednisone Last filled: 05/13/22, #6 Last OV: 03/18/22, CPE Next OV: none

## 2022-08-12 ENCOUNTER — Encounter: Payer: Self-pay | Admitting: Family Medicine

## 2022-08-12 NOTE — Telephone Encounter (Signed)
Please call - this is not right quantity/amount for gout flare.  This prednisone Rx was prescribed by Dr Silvio Pate for respiratory infection 04/2022.  What is going on with his gout currently? Normally need longer taper of prednisone when having gout flare. Would recommend OV next time he has gout flare for evaluation.   I don't see where he's had a gout flare in the past 3 years.

## 2022-08-13 ENCOUNTER — Ambulatory Visit: Payer: PPO

## 2022-08-13 DIAGNOSIS — Z950 Presence of cardiac pacemaker: Secondary | ICD-10-CM | POA: Diagnosis not present

## 2022-08-13 DIAGNOSIS — R918 Other nonspecific abnormal finding of lung field: Secondary | ICD-10-CM | POA: Diagnosis not present

## 2022-08-13 DIAGNOSIS — M48061 Spinal stenosis, lumbar region without neurogenic claudication: Secondary | ICD-10-CM | POA: Diagnosis not present

## 2022-08-13 DIAGNOSIS — M4316 Spondylolisthesis, lumbar region: Secondary | ICD-10-CM | POA: Diagnosis not present

## 2022-08-13 DIAGNOSIS — M5442 Lumbago with sciatica, left side: Secondary | ICD-10-CM | POA: Diagnosis not present

## 2022-08-13 DIAGNOSIS — G8929 Other chronic pain: Secondary | ICD-10-CM | POA: Diagnosis not present

## 2022-08-21 ENCOUNTER — Telehealth (HOSPITAL_BASED_OUTPATIENT_CLINIC_OR_DEPARTMENT_OTHER): Payer: Self-pay

## 2022-08-21 ENCOUNTER — Ambulatory Visit: Payer: PPO | Attending: Cardiovascular Disease | Admitting: Pharmacist

## 2022-08-21 ENCOUNTER — Encounter: Payer: Self-pay | Admitting: Pharmacist

## 2022-08-21 VITALS — BP 126/73 | HR 63

## 2022-08-21 DIAGNOSIS — I10 Essential (primary) hypertension: Secondary | ICD-10-CM | POA: Diagnosis not present

## 2022-08-21 MED ORDER — SPIRONOLACTONE 25 MG PO TABS: 25.0000 mg | ORAL_TABLET | Freq: Every day | ORAL | 3 refills | Status: DC

## 2022-08-21 NOTE — Patient Instructions (Signed)
It was nice meeting you today!  Your blood pressure looks excellent today  Please continue your:  Hydralazine '100mg'$  three times a day Carvedilol '25mg'$  twice a day Valsartan '320mg'$  once a day Spironolactone '25mg'$  once a day Minoxidil 2.'5mg'$  daily  Continue to limit salt intake.  Concentrate on more vegetables, proteins, healthy fats, and whole grains  Please call with any questions  Karren Cobble, PharmD, [place], [place], [place], [place], [place], [address] Phone: [phone], Fax: [phone]

## 2022-08-21 NOTE — Progress Notes (Signed)
Patient ID: Kennedy Kennedy                 DOB: August 10, 1950                      MRN: [id number]     HPI: Kennedy Kennedy is a 72 year old male who is here for his third PharmD visit in the hypertension clinical trial. At his last visit in December his BP was 144/85 and spironolactone 12.5 mg was started. His labs were WNL and BP was still high so it was increased to 25 mg daily on 08/04/22. At this appointment hydralazine was also decreased to 100 mg TID from 150 mg TID.   He is currently taking all of his medications as prescribed without missing any doses. He denies any adverse effects from his medications. He is not using as much salt on his food as before. He does want to lose weight to decrease the amount of BP medications he is on but he is unable to exercise due to chronic pain.   Has ben monitoring BP at home but forgot log.  Believes last reading was 135/75.  Current HTN meds:  Hydralazine '100mg'$  three times a day Carvedilol '25mg'$  twice a day Valsartan '320mg'$  once a day Spironolactone '25mg'$  once a day Minoxidil 2.'5mg'$  daily  BP goal: <130/80   Wt Readings from Last 3 Encounters:  06/03/22 228 lb (103.4 kg)  05/13/22 229 lb (103.9 kg)  04/30/22 225 lb (102.1 kg)   BP Readings from Last 3 Encounters:  08/21/22 126/73  07/13/22 (!) 144/85  06/04/22 128/75   Pulse Readings from Last 3 Encounters:  08/21/22 63  07/13/22 63  06/04/22 60    Renal function: CrCl cannot be calculated (Unknown ideal weight.).  Past Medical History:  Diagnosis Date   Allergic rhinitis    Arthritis    Chest pain    a. 11/2011 reportedly normal stress test performed in [place].   Chronic pain    L ankle and bilateral hips (remote fracture s/p surgeries), lower back (told has herniated disc)   Closed avulsion fracture of right talus 06/2016   COVID-19 06/2020   Dyspnea    Eczema    Erectile dysfunction 09/14/2012   Kennedy Kennedy disease    Gout 2007   Hearing loss    otosclerosis   Heartburn     mild, controlled with pepcid   History of chicken pox    History of hepatitis B    as child, no sequelae   HLD (hyperlipidemia)    HTN (hypertension)    difficult to control - clonidine and beta blockers caused bradycardia   Morbid obesity (Kennedy Kennedy)    Motion sickness    Most moving vehicles   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 moderate, AHI 37.4 increased to 80 in REM, on BiPAP 14/10, 97% compliance >4 hrs (06/2013); b. Does not tolerate CPAP.   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 Treated with vicodin, prior tried fentanyl and butrans.   Systolic murmur    Wears dentures    Partial lower   Wears hearing aid in both ears    Does not wear all  the time    Current Outpatient Medications on File Prior to Visit  Medication Sig Dispense Refill   aspirin EC 81 MG tablet Take 81 mg by mouth daily.     carvedilol (COREG) 25 MG tablet Take 1 tablet (25 mg total) by mouth 2 (two) times daily with a meal. 180 tablet 3   colchicine 0.6 MG tablet TAKE ONE TABLET BY MOUTH ONCE DAILY 90 tablet 3   fluticasone (FLONASE) 50 MCG/ACT nasal spray Place 2 sprays into both nostrils daily. 16 g 11   furosemide (LASIX) 20 MG tablet Take 1 tablet (20 mg total) by mouth 3 (three) times a week. 39 tablet 3   gabapentin (NEURONTIN) 300 MG capsule TAKE ONE CAPSULE BY MOUTH TWICE DAILY and TAKE TWO CAPSULES EVERYDAY AT BEDTIME (Patient taking differently: See admin instructions. Take one capsule by mouth twice daily and take two capsules everyday at bedtime) 360 capsule 3   hydrALAZINE (APRESOLINE) 100 MG tablet TAKE 1 AND 1/2 TABLETS BY MOUTH THREE TIMES DAILY, MAY increase TO FOUR TIMES DAILY AS NEEDED FOR HIGH BLOOD PRESSURE 270  tablet 3   meloxicam (MOBIC) 15 MG tablet TAKE 1 TABLET BY MOUTH ONCE DAILY AS NEEDED FOR  GOUT  FLARE (Patient taking differently: Take 15 mg by mouth daily as needed (gout flare).) 30 tablet 0   minoxidil (LONITEN) 2.5 MG tablet TAKE ONE TABLET BY MOUTH EVERYDAY AT BEDTIME 90 tablet 3   nitroGLYCERIN (NITROSTAT) 0.4 MG SL tablet Place 1 tablet (0.4 mg total) under the tongue every 5 (five) minutes as needed for chest pain. 25 tablet 4   pravastatin (PRAVACHOL) 40 MG tablet Take 1 tablet (40 mg total) by mouth daily. 90 tablet 3   predniSONE (DELTASONE) 20 MG tablet Take 2 tablets (40 mg total) by mouth daily. 6 tablet 0   probenecid (BENEMID) 500 MG tablet TAKE 1/2 TABLET BY MOUTH TWICE DAILY (Patient taking differently: Take 250 mg by mouth 2 (two) times daily. TAKE 1/2 TABLET BY MOUTH TWICE DAILY) 90 tablet 3   valsartan (DIOVAN) 320 MG tablet Take 1 tablet (320 mg total) by mouth daily. 90 tablet 3   vitamin C (ASCORBIC ACID) 500 MG tablet Take 500 mg by mouth daily.     No current facility-administered medications on file prior to visit.    Allergies  Allergen Reactions   Amlodipine Rash    Rash in groin   Allopurinol Other (See Comments)    GI upset   Lisinopril-Hydrochlorothiazide     Burning in feet.   Procardia [Nifedipine] Other (See Comments)    Bradycardia, leg swelling   Spironolactone Other (See Comments)    malaise, dizziness   Clonidine Derivatives Rash   Doxazosin Mesylate Rash   Latex Rash   Nortriptyline Rash   Uloric [Febuxostat] Rash     Assessment/Plan:  Assessment: BP is controlled in clinic BP 126/73 mmHg; under the goal (<130/80) He is tolerating carvedilol, valsartan, hydralazine, minoxidil, and spironolactone well without any adverse effects Denies SOB, palpitation, chest pain, headaches, or swelling  Plan: Continue valsartan 320 mg daily, minoxidil 2.5 mg daily, hydralazine 100 mg TID, carvedilol 25 mg BID, spironolactone 25 mg daily Continue  monitoring BP at home and bring readings in to his next visit Continue to limit salt intake and concentrate on eating more vegetables, proteins, healthy fats, and whole grains Follow up in 1 month with Dr. Andy Gauss PharmD Candidate [employer]  [employer] of  2024  Seen with student in room and agree with plan Karren Cobble, PharmD, [place], [place], [place], [place], [place], [address] Phone: [phone], Fax: [phone]

## 2022-08-21 NOTE — Telephone Encounter (Addendum)
Called patient, no answer, left message to call back to get scheduled!    ----- Message from Rollen Sox, [facility] sent at 08/21/2022  4:25 PM EST ----- Regarding: htn clinic Patient seen today for HTN #3.  Needs follow up with Dr Oval Linsey

## 2022-08-24 NOTE — Telephone Encounter (Signed)
2nd call attempt to patient, no answer, left message for patient to call back to schedule one month follow up in [facility] with Dr. Oval Linsey

## 2022-08-25 ENCOUNTER — Encounter (HOSPITAL_BASED_OUTPATIENT_CLINIC_OR_DEPARTMENT_OTHER): Payer: Self-pay

## 2022-08-25 NOTE — Telephone Encounter (Signed)
3rd call attempt, no answer, left message, letter mailed to patient asking him to call and schedule. Removing from triage basket!

## 2022-09-07 ENCOUNTER — Ambulatory Visit
Admission: RE | Admit: 2022-09-07 | Discharge: 2022-09-07 | Disposition: A | Discharge status: Home / Self Care | Payer: Self-pay | Source: Ambulatory Visit | Attending: Neurosurgery | Admitting: Neurosurgery

## 2022-09-07 ENCOUNTER — Other Ambulatory Visit: Payer: Self-pay

## 2022-09-07 DIAGNOSIS — Z049 Encounter for examination and observation for unspecified reason: Secondary | ICD-10-CM

## 2022-09-09 NOTE — Progress Notes (Unsigned)
Referring Physician:  Doyle Askew, MD [place],  [place] [id number]  Primary Physician:  Randy Bush, MD  History of Present Illness: 09/10/2022 Mr. Kennedy Kennedy is here today with a chief complaint of low back pain that radiates into the bilateral legs. The right leg pain stops at the knee, the pain in the left leg goes all the way down to the foot.   He had a severe accident involving a fall from height approximately 20 years ago.  He has used a cane since that time.  He has had back pain for the past 10 years though it has been worsening over time.  He gets severe pain in his buttocks bilaterally and down his left leg when he stands or walks.  Lifting and activity makes it worse.  Sitting improves his pain.  His pain is constant and has been a major issue for some time.   Bowel/Bladder Dysfunction: none  Conservative measures: Seen a Chiropractor  Physical therapy: has not participated in within the past year Multimodal medical therapy including regular antiinflammatories:  gabapentin, meloxicam, prednisone, hydrocodone  Injections:  has received epidural steroid injections 11/15/20: Left L4-5 TF ESI(no relief) 10/10/20: Left L5-S1 TF ESI (50%relief)  Past Surgery: denies  Kennedy Kennedy has no symptoms of cervical myelopathy.  The symptoms are causing a significant impact on the patient's life.   I have utilized the care everywhere function in epic to review the outside records available from external health systems.  Review of Systems:  A 10 point review of systems is negative, except for the pertinent positives and negatives detailed in the HPI.  Past Medical History: Past Medical History:  Diagnosis Date   Allergic rhinitis    Arthritis    Chest pain    a. 11/2011 reportedly normal stress test performed in [place].   Chronic pain    L ankle and bilateral hips (remote fracture s/p surgeries), lower back (told has herniated disc)    Closed avulsion fracture of right talus 06/2016   COVID-19 06/2020   Dyspnea    Eczema    Erectile dysfunction 09/14/2012   Kennedy Kennedy disease    Gout 2007   Hearing loss    otosclerosis   Heartburn    mild, controlled with pepcid   History of chicken pox    History of hepatitis B    as child, no sequelae   HLD (hyperlipidemia)    HTN (hypertension)    difficult to control - clonidine and beta blockers caused bradycardia   Morbid obesity ([place])    Motion sickness    Most moving vehicles   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 moderate, AHI 37.4 increased to 80 in REM, on BiPAP 14/10, 97% compliance >4 hrs (06/2013); b. Does not tolerate CPAP.   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 Treated with vicodin, prior tried fentanyl and butrans.   Systolic murmur    Wears dentures    Partial lower   Wears hearing aid in both ears    Does not wear all the time    Past Surgical History: Past Surgical History:  Procedure Laterality Date   CATARACT EXTRACTION W/PHACO  Right 11/26/2020   Procedure: CATARACT EXTRACTION PHACO AND INTRAOCULAR LENS PLACEMENT (IOC) RIGHT VIVITY TORIC LENS 7.17 00:49.6;  Surgeon: Kennedy Robson, MD;  Location: [facility];  Service: Ophthalmology;  Laterality: Right;  Latex Sleep Apnea   CATARACT EXTRACTION W/PHACO Left 12/10/2020   Procedure: CATARACT EXTRACTION PHACO AND INTRAOCULAR LENS PLACEMENT ([place]) LEFT VIVITY;  Surgeon: Kennedy Robson, MD;  Location: [facility];  Service: Ophthalmology;  Laterality: Left;  Latex Sleep apnea 5.37 00:32.7   COLONOSCOPY  02/2011   ext hem, benign polyp, rpt 5 yrs ([place])   COLONOSCOPY WITH PROPOFOL N/A 12/17/2020   multiple  TA, ext hem, rpt 3 yrs (Randy Kennedy, Randy Due, MD)   LEG SURGERY  x5   left - after fall at work (3.5 stories)   [place]   jaw fracture - horse kick   MOHS SURGERY Right 07/03/2021   Ear   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   Procedure: PACEMAKER IMPLANT;  Surgeon: Kennedy Haw, MD;  Location: [facility];  Service: Cardiovascular;  Laterality: N/A;   TYMPANIC MEMBRANE REPAIR Left 1994   otosclerosis    Allergies: Allergies as of 09/10/2022 - Review Complete 09/10/2022  Allergen Reaction Noted   Amlodipine Rash 06/24/2021   Allopurinol Other (See Comments) 09/13/2012   Lisinopril-hydrochlorothiazide  02/16/2013   Procardia [nifedipine] Other (See Comments) 07/12/2013   Spironolactone Other (See Comments) 05/28/2017   Clonidine derivatives Rash 02/25/2022   Doxazosin mesylate Rash 10/07/2017   Latex Rash 11/14/2020   Nortriptyline Rash 07/01/2018   Uloric [febuxostat] Rash 05/07/2017    Medications: Current Meds  Medication Sig   aspirin EC 81 MG tablet Take 81 mg by mouth daily.   carvedilol (COREG) 25 MG tablet Take 1 tablet (25 mg total) by mouth 2 (two) times daily with a meal.   colchicine 0.6 MG tablet TAKE ONE TABLET BY MOUTH ONCE DAILY   fluticasone (FLONASE) 50 MCG/ACT nasal spray Place 2 sprays into both nostrils daily.   furosemide (LASIX) 20 MG tablet Take 1 tablet (20 mg total) by mouth 3 (three) times a week.   gabapentin (NEURONTIN) 300 MG capsule TAKE ONE CAPSULE BY MOUTH TWICE DAILY and TAKE TWO CAPSULES EVERYDAY AT BEDTIME (Patient taking differently: See admin instructions. Take one capsule by mouth twice daily and take two capsules everyday at bedtime)   hydrALAZINE (APRESOLINE) 100 MG tablet TAKE 1 AND 1/2 TABLETS BY MOUTH THREE TIMES DAILY, MAY increase TO FOUR TIMES DAILY AS NEEDED FOR HIGH BLOOD PRESSURE   meloxicam (MOBIC) 15 MG tablet TAKE 1 TABLET BY MOUTH ONCE DAILY AS NEEDED FOR  GOUT  FLARE (Patient taking differently: Take 15  mg by mouth daily as needed (gout flare).)   minoxidil (LONITEN) 2.5 MG tablet TAKE ONE TABLET BY MOUTH EVERYDAY AT BEDTIME   nitroGLYCERIN (NITROSTAT) 0.4 MG SL tablet Place 1 tablet (0.4 mg total) under the tongue every 5 (five) minutes as needed for chest pain.   pravastatin (PRAVACHOL) 40 MG tablet Take 1 tablet (40 mg total) by mouth daily.   predniSONE (DELTASONE) 20 MG tablet Take 2 tablets (40 mg total) by mouth daily.   probenecid (BENEMID) 500 MG tablet TAKE 1/2 TABLET BY MOUTH TWICE DAILY (Patient taking differently: Take 250 mg by mouth 2 (two) times daily. TAKE 1/2 TABLET BY MOUTH TWICE DAILY)   spironolactone (ALDACTONE) 25 MG tablet Take 1 tablet (25 mg total) by mouth daily.   valsartan (DIOVAN) 320 MG tablet Take 1 tablet (320  mg total) by mouth daily.   vitamin C (ASCORBIC ACID) 500 MG tablet Take 500 mg by mouth daily.    Social History: Social History   Tobacco Use   Smoking status: Former    Packs/day: 1.00    Years: 4.00    Total pack years: 4.00    Types: Pipe, Cigarettes    Quit date: 07/27/1978    Years since quitting: 44.1   Smokeless tobacco: Never  Vaping Use   Vaping Use: Never used  Substance Use Topics   Alcohol use: No    Alcohol/week: 0.0 standard drinks of alcohol   Drug use: No    Family Medical History: Family History  Problem Relation Age of Onset   Cancer Father 77       prostate   Cancer Mother        rectal   Hypertension Mother    Cancer Paternal Uncle        prostate   Diabetes Neg Hx    Stroke Neg Hx    CAD Neg Hx     Physical Examination: Vitals:   09/10/22 1116  BP: (!) 140/82    General: Patient is well developed, well nourished, calm, collected, and in no apparent distress. Attention to examination is appropriate.  Neck:   Supple.  Full range of motion.  Respiratory: Patient is breathing without any difficulty.   NEUROLOGICAL:     Awake, alert, oriented to person, place, and time.  Speech is clear and fluent.    Cranial Nerves: Pupils equal round and reactive to light.  Facial tone is symmetric.  Facial sensation is symmetric. Shoulder shrug is symmetric. Tongue protrusion is midline.  There is no pronator drift.  ROM of spine: full.    Strength: Side Biceps Triceps Deltoid Interossei Grip Wrist Ext. Wrist Flex.  R 5 5 5 5 5 5 5  $ L 5 5 5 5 5 5 5   $ Side Iliopsoas Quads Hamstring PF DF EHL  R 5 5 5 5 5 5  $ L 5 5 5 5 4 4   $ Reflexes are 1+ and symmetric at the biceps, triceps, brachioradialis, patella and achilles.   Hoffman's is absent.   Bilateral upper and lower extremity sensation is intact to light touch.    No evidence of dysmetria noted.  Gait is antalgic and requires a cane.     Medical Decision Making  Imaging: MRI L spine 08/13/2022 T12-L1: Disc bulge. No spinal canal or neuroforaminal narrowing.    L1-L2: Unremarkable.  L2-L3: Disc bulge with facet arthrosis and ligamentum flavum thickening. No  spinal canal stenosis. Mild bilateral neuroforaminal narrowing    L3-L4: Disc bulge with bilateral facet arthrosis and ligamentum flavum  thickening. There is mild spinal canal stenosis. Mild bilateral  neuroforaminal narrowing.    L4-L5: There is uncovering of the intervertebral disc with a disc bulge.  Bilateral advanced facet arthrosis and ligamentum flavum thickening. There  is severe spinal canal stenosis with moderate bilateral neuroforaminal  narrowing.    L5-S1: Bilateral facet arthrosis. No spinal canal or neuroforaminal  narrowing.      IMPRESSION:  Grade 1 anterior listhesis of L4-L5. 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    Electronically Signed by:  Earney Mallet, MD, [facility]  Electronically Signed on:  08/13/2022 8:23 PM   I have personally reviewed the images and agree with the above interpretation.  Assessment and Plan: Mr. Vanderhoef is a pleasant 72  y.o. male with anterolisthesis at L4-5  causing severe spinal stenosis.  This is likely degenerative in nature and causes back pain as well as bilateral sciatica.  He has tried injections but not physical therapy.  I recommended that he start with physical therapy.  If he does not improve with that, we will discuss what options may improve his condition.  I will see him back in 8 weeks after he has done physical therapy.  I spent a total of 30 minutes in this patient's care today. This time was spent reviewing pertinent records including imaging studies, obtaining and confirming history, performing a directed evaluation, formulating and discussing my recommendations, and documenting the visit within the medical record.    Thank you for involving me in the care of this patient.      Mihailo Sage K. Izora Ribas MD, [facility] Neurosurgery

## 2022-09-10 ENCOUNTER — Encounter: Payer: Self-pay | Admitting: Neurosurgery

## 2022-09-10 ENCOUNTER — Ambulatory Visit (INDEPENDENT_AMBULATORY_CARE_PROVIDER_SITE_OTHER): Payer: PPO | Admitting: Neurosurgery

## 2022-09-10 ENCOUNTER — Ambulatory Visit (INDEPENDENT_AMBULATORY_CARE_PROVIDER_SITE_OTHER): Payer: PPO

## 2022-09-10 VITALS — BP 140/82 | Ht 67.0 in | Wt 233.4 lb

## 2022-09-10 DIAGNOSIS — M5441 Lumbago with sciatica, right side: Secondary | ICD-10-CM | POA: Diagnosis not present

## 2022-09-10 DIAGNOSIS — M4310 Spondylolisthesis, site unspecified: Secondary | ICD-10-CM

## 2022-09-10 DIAGNOSIS — M4316 Spondylolisthesis, lumbar region: Secondary | ICD-10-CM | POA: Diagnosis not present

## 2022-09-10 DIAGNOSIS — G8929 Other chronic pain: Secondary | ICD-10-CM

## 2022-09-10 DIAGNOSIS — I495 Sick sinus syndrome: Secondary | ICD-10-CM | POA: Diagnosis not present

## 2022-09-10 DIAGNOSIS — M5442 Lumbago with sciatica, left side: Secondary | ICD-10-CM

## 2022-09-10 LAB — CUP PACEART REMOTE DEVICE CHECK
Battery Remaining Longevity: 120 mo
Battery Remaining Percentage: 95.5 %
Battery Voltage: 3.02 V
Brady Statistic AP VP Percent: 2 %
Brady Statistic AP VS Percent: 98 %
Brady Statistic AS VP Percent: 1 %
Brady Statistic AS VS Percent: 1 %
Brady Statistic RA Percent Paced: 99 %
Brady Statistic RV Percent Paced: 2 %
Date Time Interrogation Session: 20240215020024
Implantable Lead Connection Status: 753985
Implantable Lead Connection Status: 753985
Implantable Lead Implant Date: 20230804
Implantable Lead Implant Date: 20230804
Implantable Lead Location: 753859
Implantable Lead Location: 753860
Implantable Pulse Generator Implant Date: 20230804
Lead Channel Impedance Value: 450 Ohm
Lead Channel Impedance Value: 530 Ohm
Lead Channel Pacing Threshold Amplitude: 0.625 V
Lead Channel Pacing Threshold Amplitude: 0.75 V
Lead Channel Pacing Threshold Pulse Width: 0.5 ms
Lead Channel Pacing Threshold Pulse Width: 0.5 ms
Lead Channel Sensing Intrinsic Amplitude: 12 mV
Lead Channel Sensing Intrinsic Amplitude: 2.5 mV
Lead Channel Setting Pacing Amplitude: 1 V
Lead Channel Setting Pacing Amplitude: 1.625
Lead Channel Setting Pacing Pulse Width: 0.5 ms
Lead Channel Setting Sensing Sensitivity: 2 mV
Pulse Gen Model: 2272
Pulse Gen Serial Number: 8101269

## 2022-09-28 NOTE — Progress Notes (Incomplete)
Advanced Hypertension Clinic Initial Assessment:    Date:  09/28/2022   ID:  Randy Kennedy, DOB [birth year], MRN [id number]  PCP:  [name], MD  Cardiologist:  None  Nephrologist:  Referring MD: [name], MD   CC: Hypertension  History of Present Illness:    Randy Kennedy is a 72 y.o. male with a hx of hypertension, *** here to establish care in the Advanced Hypertension Clinic.   Today,  *** denies any palpitations, chest pain, shortness of breath, or peripheral edema. No lightheadedness, headaches, syncope, orthopnea, or PND.  (+)  ***Plan: -  Previous antihypertensives:   Past Medical History:  Diagnosis Date   Allergic rhinitis    Arthritis    Chest pain    a. 11/2011 reportedly normal stress test performed in [place].   Chronic pain    L ankle and bilateral hips (remote fracture s/p surgeries), lower back (told has herniated disc)   Closed avulsion fracture of right talus 06/2016   COVID-19 06/2020   Dyspnea    Eczema    Erectile dysfunction 09/14/2012   [name] disease    Gout 2007   Hearing loss    otosclerosis   Heartburn    mild, controlled with pepcid   History of chicken pox    History of hepatitis B    as child, no sequelae   HLD (hyperlipidemia)    HTN (hypertension)    difficult to control - clonidine and beta blockers caused bradycardia   Morbid obesity ([place])    Motion sickness    Most moving vehicles   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 moderate, AHI 37.4 increased to 80 in REM, on BiPAP 14/10, 97% compliance >4 hrs (06/2013); b. Does not tolerate CPAP.   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 Treated with vicodin, prior tried fentanyl  and butrans.   Systolic murmur    Wears dentures    Partial lower   Wears hearing aid in both ears    Does not wear all the time    Past Surgical History:  Procedure Laterality Date   CATARACT EXTRACTION W/PHACO Right 11/26/2020   Procedure: CATARACT EXTRACTION PHACO AND INTRAOCULAR LENS PLACEMENT ([facility]) RIGHT VIVITY TORIC LENS 7.17 00:49.6;  Surgeon: Birder Robson, MD;  Location: [facility];  Service: Ophthalmology;  Laterality: Right;  Latex Sleep Apnea   CATARACT EXTRACTION W/PHACO Left 12/10/2020   Procedure: CATARACT EXTRACTION PHACO AND INTRAOCULAR LENS PLACEMENT ([place]) LEFT VIVITY;  Surgeon: Birder Robson, MD;  Location: [facility];  Service: Ophthalmology;  Laterality: Left;  Latex Sleep apnea 5.37 00:32.7   COLONOSCOPY  02/2011   ext hem, benign polyp, rpt 5 yrs ([place])   COLONOSCOPY WITH PROPOFOL N/A 12/17/2020   multiple TA, ext hem, rpt 3 yrs ([place], Tally Due, MD)   LEG SURGERY  x5   left - after fall at work (3.5 stories)   [place]   jaw fracture - horse kick   MOHS SURGERY Right 07/03/2021   Ear   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   Procedure: PACEMAKER IMPLANT;  Surgeon: Constance Haw, MD;  Location: [facility];  Service: Cardiovascular;  Laterality:  N/A;   TYMPANIC MEMBRANE REPAIR Left 1994   otosclerosis    Current Medications: No outpatient medications have been marked as taking for the 10/01/22 encounter (Appointment) with Skeet Latch, MD.     Allergies:   Amlodipine, Allopurinol, Lisinopril-hydrochlorothiazide, Procardia [nifedipine], Spironolactone, Clonidine derivatives, Doxazosin mesylate, Latex, Nortriptyline, and Uloric [febuxostat]   Social History   Socioeconomic History   Marital status: Married    Spouse name: Not on file   Number of children: Not on file   Years of education: Not on file   Highest education level: Not on file  Occupational History   Not on file  Tobacco Use    Smoking status: Former    Packs/day: 1.00    Years: 4.00    Total pack years: 4.00    Types: Pipe, Cigarettes    Quit date: 07/27/1978    Years since quitting: 44.2   Smokeless tobacco: Never  Vaping Use   Vaping Use: Never used  Substance and Sexual Activity   Alcohol use: No    Alcohol/week: 0.0 standard drinks of alcohol   Drug use: No   Sexual activity: Not on file  Other Topics Concern   Not on file  Social History Narrative   Caffeine: 2 cans coke/day   Lives with wife and grown son    Occupation: retired, was Agricultural engineer.   On disability for chronic pain   Edu: MBA   Activity: no regular exercise   Deit: good water, fruits/vegetables daily   Social Determinants of Health   Financial Resource Strain: Low Risk  (10/02/2021)   Overall Financial Resource Strain (CARDIA)    Difficulty of Paying Living Expenses: Not hard at all  Food Insecurity: No Food Insecurity (10/02/2021)   Hunger Vital Sign    Worried About Running Out of Food in the Last Year: Never true    Ran Out of Food in the Last Year: Never true  Transportation Needs: No Transportation Needs (10/02/2021)   PRAPARE - Hydrologist (Medical): No    Lack of Transportation (Non-Medical): No  Physical Activity: Insufficiently Active (04/30/2022)   Exercise Vital Sign    Days of Exercise per Week: 2 days    Minutes of Exercise per Session: 50 min  Stress: No Stress Concern Present (10/02/2021)   [address]    Feeling of Stress : Not at all  Social Connections: Moderately Isolated (10/02/2021)   Social Connection and Isolation Panel [NHANES]    Frequency of Communication with Friends and Family: More than three times a week    Frequency of Social Gatherings with Friends and Family: More than three times a week    Attends Religious Services: Never    Marine scientist or Organizations: No    Attends Programme researcher, broadcasting/film/video: Never    Marital Status: Married     Family History: The patient's family history includes Cancer in his mother and paternal uncle; Cancer (age of onset: 45) in his father; Hypertension in his mother. There is no history of Diabetes, Stroke, or CAD.  ROS:   Please see the history of present illness.     All other systems reviewed and are negative.  EKGs/Labs/Other Studies Reviewed:    ***  EKG:  EKG is personally reviewed. : Sinus ***. Rate *** bpm.  Recent Labs: 03/18/2022: ALT 25; Hemoglobin 14.4; Platelets 154.0 05/01/2022: TSH 2.690 08/03/2022: BUN 16; Creatinine, Ser 1.09;  Potassium 4.0; Sodium 139   Recent Lipid Panel    Component Value Date/Time   CHOL 180 03/18/2022 1142   CHOL 130 02/09/2012 0000   TRIG 337.0 (H) 03/18/2022 1142   TRIG 142 02/09/2012 0000   HDL 62.00 03/18/2022 1142   CHOLHDL 3 03/18/2022 1142   VLDL 67.4 (H) 03/18/2022 1142   LDLCALC 63 12/29/2021 0952   LDLCALC 47 02/09/2012 0000   LDLDIRECT 83.0 03/18/2022 1142    Physical Exam:    VS:  There were no vitals taken for this visit. , BMI There is no height or weight on file to calculate BMI. GENERAL:  Well appearing HEENT: Pupils equal round and reactive, fundi not visualized, oral mucosa unremarkable NECK:  No jugular venous distention, waveform within normal limits, carotid upstroke brisk and symmetric, no bruits, no thyromegaly LYMPHATICS:  No cervical adenopathy LUNGS:  Clear to auscultation bilaterally HEART:  RRR.  PMI not displaced or sustained,S1 and S2 within normal limits, no S3, no S4, no clicks, no rubs, *** murmurs ABD:  Flat, positive bowel sounds normal in frequency in pitch, no bruits, no rebound, no guarding, no midline pulsatile mass, no hepatomegaly, no splenomegaly EXT:  2 plus pulses throughout, no edema, no cyanosis, no clubbing SKIN:  No rashes, no nodules NEURO:  Cranial nerves II through XII grossly intact, motor grossly intact throughout PSYCH:   Cognitively intact, oriented to person place and time   ASSESSMENT/PLAN:    No problem-specific Assessment & Plan notes found for this encounter.   Screening for Secondary Hypertension: { Click here to document screening for secondary causes of HTN  :[id number]    05/01/2022   11:07 AM 06/04/2022    7:10 AM  Causes  Drugs/Herbals Screened   Renovascular HTN Screened Screened     - Comments  no RAS  Sleep Apnea Screened Screened     - Comments Intolerant of CPAP unable to tolerate CPAP  Thyroid Disease Screened Screened     - Comments  TSH WNL  Pheochromocytoma Screened Screened     - Comments  labs WNL  Cushing's Syndrome Screened   Compliance Screened     Relevant Labs/Studies:    Latest Ref Rng & Units 08/03/2022   11:21 AM 05/01/2022   11:45 AM 03/18/2022   11:42 AM  Basic Labs  Sodium 134 - 144 mmol/L 139  141  139   Potassium 3.5 - 5.2 mmol/L 4.0  4.6  4.0   Creatinine 0.76 - 1.27 mg/dL 1.09  1.16  1.18        Latest Ref Rng & Units 05/01/2022   11:45 AM 08/26/2020    9:51 AM  Thyroid   TSH 0.450 - 4.500 uIU/mL 2.690  2.82           Latest Ref Rng & Units 05/01/2022   11:45 AM  Metanephrines/Catecholamines   Epinephrine 0 - 62 pg/mL 55   Norepinephrine 0 - 874 pg/mL 675   Dopamine 0 - 48 pg/mL <30   Metanephrines 0.0 - 88.0 pg/mL 89.0   Normetanephrines  0.0 - 285.2 pg/mL 109.5           05/20/2022   12:52 PM  Renovascular   Renal Artery [place] Completed Yes        he consents to be monitored in our remote patient monitoring program through [facility].  he will track his blood pressure twice daily and understands that these trends will help [place] to adjust his medications as needed prior  to his next appointment.  he *** interested in enrolling in the PREP exercise and nutrition program through the [facility].     Disposition:   *** FU with APP/PharmD in 1 month for the next 3 months.   FU with Tiffany C. Oval Linsey, MD, [facility] in 4 months.  Medication Adjustments/Labs  and Tests Ordered: Current medicines are reviewed at length with the patient today.  Concerns regarding medicines are outlined above.   No orders of the defined types were placed in this encounter.  No orders of the defined types were placed in this encounter.   I,Mathew Stumpf,acting as a Education administrator for Skeet Latch, MD.,have documented all relevant documentation on the behalf of Skeet Latch, MD,as directed by  Skeet Latch, MD while in the presence of Skeet Latch, MD.  ***  Signed, Madelin Rear  09/28/2022 4:33 PM    [place]

## 2022-09-30 ENCOUNTER — Encounter (HOSPITAL_BASED_OUTPATIENT_CLINIC_OR_DEPARTMENT_OTHER): Payer: Self-pay | Admitting: *Deleted

## 2022-09-30 DIAGNOSIS — L739 Follicular disorder, unspecified: Secondary | ICD-10-CM | POA: Diagnosis not present

## 2022-09-30 DIAGNOSIS — D2262 Melanocytic nevi of left upper limb, including shoulder: Secondary | ICD-10-CM | POA: Diagnosis not present

## 2022-09-30 DIAGNOSIS — Z08 Encounter for follow-up examination after completed treatment for malignant neoplasm: Secondary | ICD-10-CM | POA: Diagnosis not present

## 2022-09-30 DIAGNOSIS — D2261 Melanocytic nevi of right upper limb, including shoulder: Secondary | ICD-10-CM | POA: Diagnosis not present

## 2022-09-30 DIAGNOSIS — Z85828 Personal history of other malignant neoplasm of skin: Secondary | ICD-10-CM | POA: Diagnosis not present

## 2022-09-30 DIAGNOSIS — L309 Dermatitis, unspecified: Secondary | ICD-10-CM | POA: Diagnosis not present

## 2022-09-30 DIAGNOSIS — L821 Other seborrheic keratosis: Secondary | ICD-10-CM | POA: Diagnosis not present

## 2022-10-01 ENCOUNTER — Ambulatory Visit (HOSPITAL_BASED_OUTPATIENT_CLINIC_OR_DEPARTMENT_OTHER): Payer: PPO | Admitting: Cardiovascular Disease

## 2022-10-05 ENCOUNTER — Ambulatory Visit (INDEPENDENT_AMBULATORY_CARE_PROVIDER_SITE_OTHER): Payer: PPO

## 2022-10-05 ENCOUNTER — Other Ambulatory Visit: Payer: Self-pay | Admitting: Cardiovascular Disease

## 2022-10-05 VITALS — Ht 67.0 in | Wt 228.0 lb

## 2022-10-05 DIAGNOSIS — Z0000 Encounter for general adult medical examination without abnormal findings: Secondary | ICD-10-CM | POA: Diagnosis not present

## 2022-10-05 NOTE — Telephone Encounter (Signed)
Rx request sent to pharmacy.  

## 2022-10-05 NOTE — Telephone Encounter (Signed)
Refill request

## 2022-10-05 NOTE — Progress Notes (Signed)
I connected with  [name] on 10/05/22 by a audio enabled telemedicine application and verified that I am speaking with the correct person using two identifiers.  Patient Location: Home  Provider Location: Home Office  I discussed the limitations of evaluation and management by telemedicine. The patient expressed understanding and agreed to proceed.  Subjective:   Randy Kennedy is a 72 y.o. male who presents for Medicare Annual/Subsequent preventive examination.  Review of Systems      Cardiac Risk Factors include: advanced age (>16mn, >>72women)     Objective:    Today's Vitals   10/05/22 1029 10/05/22 1030  Weight: 228 lb (103.4 kg)   Height: '5\' 7"'$  (1.702 m)   PainSc:  7    Body mass index is 35.71 kg/m.     10/05/2022   10:47 AM 02/26/2022    5:00 AM 10/02/2021    1:22 PM 12/17/2020    8:36 AM 12/10/2020   11:33 AM 11/26/2020   11:01 AM 10/01/2020    1:09 PM  Advanced Directives  Does Patient Have a Medical Advance Directive? Yes Yes Yes Yes Yes Yes No  Type of AParamedicof [place]tz[place]Living will Living will HMaumeeLiving will  HPoseyLiving will HLa GrangeLiving will   Does patient want to make changes to medical advance directive?  No - Patient declined Yes (MAU/Ambulatory/Procedural Areas - Information given)  No - Patient declined No - Patient declined   Copy of HGassvillein Chart? No - copy requested    No - copy requested No - copy requested   Would patient like information on creating a medical advance directive?       Yes (MAU/Ambulatory/Procedural Areas - Information given)    Current Medications (verified) Outpatient Encounter Medications as of 10/05/2022  Medication Sig   aspirin EC 81 MG tablet Take 81 mg by mouth daily.   carvedilol (COREG) 25 MG tablet Take 1 tablet (25 mg total) by mouth 2 (two) times daily with a meal.   colchicine 0.6 MG  tablet TAKE ONE TABLET BY MOUTH ONCE DAILY   fluticasone (FLONASE) 50 MCG/ACT nasal spray Place 2 sprays into both nostrils daily.   gabapentin (NEURONTIN) 300 MG capsule TAKE ONE CAPSULE BY MOUTH TWICE DAILY and TAKE TWO CAPSULES EVERYDAY AT BEDTIME (Patient taking differently: See admin instructions. Take one capsule by mouth twice daily and take two capsules everyday at bedtime)   hydrALAZINE (APRESOLINE) 100 MG tablet TAKE 1 AND 1/2 TABLETS BY MOUTH THREE TIMES DAILY, MAY increase TO FOUR TIMES DAILY AS NEEDED FOR HIGH BLOOD PRESSURE   minoxidil (LONITEN) 2.5 MG tablet TAKE ONE TABLET BY MOUTH EVERYDAY AT BEDTIME   nitroGLYCERIN (NITROSTAT) 0.4 MG SL tablet Place 1 tablet (0.4 mg total) under the tongue every 5 (five) minutes as needed for chest pain.   pravastatin (PRAVACHOL) 40 MG tablet Take 1 tablet (40 mg total) by mouth daily.   probenecid (BENEMID) 500 MG tablet TAKE 1/2 TABLET BY MOUTH TWICE DAILY (Patient taking differently: Take 250 mg by mouth 2 (two) times daily. TAKE 1/2 TABLET BY MOUTH TWICE DAILY)   spironolactone (ALDACTONE) 25 MG tablet Take 1 tablet (25 mg total) by mouth daily.   valsartan (DIOVAN) 320 MG tablet Take 1 tablet (320 mg total) by mouth daily.   vitamin C (ASCORBIC ACID) 500 MG tablet Take 500 mg by mouth daily.   meloxicam (MOBIC) 15 MG tablet TAKE  1 TABLET BY MOUTH ONCE DAILY AS NEEDED FOR  GOUT  FLARE (Patient not taking: Reported on 10/05/2022)   predniSONE (DELTASONE) 20 MG tablet Take 2 tablets (40 mg total) by mouth daily. (Patient not taking: Reported on 10/05/2022)   [DISCONTINUED] furosemide (LASIX) 20 MG tablet Take 1 tablet (20 mg total) by mouth 3 (three) times a week. (Patient not taking: Reported on 10/05/2022)   No facility-administered encounter medications on file as of 10/05/2022.    Allergies (verified) Amlodipine, Allopurinol, Lisinopril-hydrochlorothiazide, Procardia [nifedipine], Spironolactone, Clonidine derivatives, Doxazosin mesylate,  Latex, Nortriptyline, and Uloric [febuxostat]   History: Past Medical History:  Diagnosis Date   Allergic rhinitis    Arthritis    Chest pain    a. 11/2011 reportedly normal stress test performed in [place].   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 moderate, AHI 37.4 increased to 80 in REM, on BiPAP 14/10, 97% compliance >4 hrs (06/2013); b. Does not tolerate CPAP.   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 Treated with vicodin, prior tried fentanyl and butrans.   Systolic murmur    Wears dentures    Partial lower   Wears hearing aid in both ears    Does not wear all the time   Past Surgical History:  Procedure Laterality Date   CATARACT EXTRACTION W/PHACO Right 11/26/2020   Procedure: CATARACT EXTRACTION PHACO AND INTRAOCULAR LENS PLACEMENT (I[facility]) RIGHT VIVITY TORIC LENS 7.17 00:49.6;  Surgeon: Birder Robson, MD;  Location: [facility];  Service: Ophthalmology;  Laterality: Right;  Latex Sleep Apnea   CATARACT EXTRACTION W/PHACO Left 12/10/2020   Procedure: CATARACT EXTRACTION PHACO AND  INTRAOCULAR LENS PLACEMENT ([place]) LEFT VIVITY;  Surgeon: Birder Robson, MD;  Location: [facility];  Service: Ophthalmology;  Laterality: Left;  Latex Sleep apnea 5.37 00:32.7   COLONOSCOPY  02/2011   ext hem, benign polyp, rpt 5 yrs ([place])   COLONOSCOPY WITH PROPOFOL N/A 12/17/2020   multiple TA, ext hem, rpt 3 yrs ([place], Tally Due, MD)   LEG SURGERY  x5   left - after fall at work (3.5 stories)   [place]   jaw fracture - horse kick   MOHS SURGERY Right 07/03/2021   Ear   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   Procedure: PACEMAKER IMPLANT;  Surgeon: Constance Haw, MD;  Location: [facility];  Service: Cardiovascular;  Laterality: N/A;   TYMPANIC MEMBRANE REPAIR Left  1994   otosclerosis   Family History  Problem Relation Age of Onset   Cancer Father 7       prostate   Cancer Mother        rectal   Hypertension Mother    Cancer Paternal Uncle        prostate   Diabetes Neg Hx    Stroke Neg Hx    CAD Neg Hx    Social History   Socioeconomic History   Marital status: Married    Spouse name: Not on file   Number of children: Not on file   Years of education: Not on file   Highest education level: Not on file  Occupational History   Not on file  Tobacco Use   Smoking status: Former    Packs/day: 1.00    Years: 4.00    Total pack years: 4.00    Types: Pipe, Cigarettes    Quit date: 07/27/1978    Years since quitting: 44.2   Smokeless tobacco: Never  Vaping Use   Vaping Use: Never used  Substance and Sexual Activity   Alcohol use: No    Alcohol/week: 0.0 standard drinks of alcohol   Drug use: No   Sexual activity: Not on file  Other Topics Concern   Not on file  Social History Narrative   Caffeine: 2 cans coke/day   Lives with wife and grown son    Occupation: retired, was Agricultural engineer.   On disability for chronic pain   Edu: MBA   Activity: no regular exercise   Deit: good water, fruits/vegetables daily    Social Determinants of Health   Financial Resource Strain: Low Risk  (10/05/2022)   Overall Financial Resource Strain (CARDIA)    Difficulty of Paying Living Expenses: Not hard at all  Food Insecurity: No Food Insecurity (10/05/2022)   Hunger Vital Sign    Worried About Running Out of Food in the Last Year: Never true    Ran Out of Food in the Last Year: Never true  Transportation Needs: No Transportation Needs (10/05/2022)   PRAPARE - Hydrologist (Medical): No    Lack of Transportation (Non-Medical): No  Physical Activity: Inactive (10/05/2022)   Exercise Vital Sign    Days of Exercise per Week: 0 days    Minutes of Exercise per Session: 0 min  Stress: No Stress Concern Present (10/05/2022)   [place]    Feeling of Stress : Not at all  Social Connections: Moderately Isolated (10/05/2022)   Social Connection and Isolation Panel [NHANES]    Frequency of Communication with Friends and Family: More than three times a week    Frequency of Social Gatherings with Friends and Family: Three times a week    Attends Religious Services: Never    Active Member of Clubs or Organizations: No    Attends Archivist Meetings: Never    Marital Status: Married    Tobacco Counseling Counseling given: Not Answered   Clinical Intake:  Pre-visit preparation completed: Yes  Pain : 0-10 Pain Score: 7  Pain Type: Chronic pain Pain Location: Generalized (Sees orthopedic and goes to PT) Pain Onset: More than a month ago     Nutritional Risks: None Diabetes: No  How often do you need to have someone help you when you read instructions, pamphlets, or other written materials from your doctor or pharmacy?: 1 - Never  Diabetic? no  Interpreter Needed?: No  Information entered by :: C.Lysandra Loughmiller LPN   Activities of Daily Living    10/05/2022   10:47 AM 10/05/2022    3:25 AM  In your  present state of health, do you have any difficulty performing the following activities:  Hearing? 0 1  Vision? 0 1  Difficulty concentrating or making decisions? 0 0  Walking or climbing stairs? 0 1  Dressing or bathing? 0 0  Doing errands, shopping? 0 0  Preparing Food and eating ? N N  Using the Toilet? N N  In the past six months, have you accidently leaked urine? N N  Do you have problems with loss of bowel control? N N  Managing your Medications? N N  Managing your Finances? N N  Housekeeping or managing your Housekeeping? N N    Patient Care Team: [name], MD as PCP - General (Family Medicine) Constance Haw, MD as PCP - Electrophysiology (Cardiology) [name], [facility] as Pharmacist (Pharmacist) Minna Merritts, MD as Consulting Physician (Cardiology)  Indicate any recent Medical Services you may have received from other than [facility] providers in the past year (date may be approximate).     Assessment:   This is a routine wellness examination for Trina.  Hearing/Vision screen Hearing Screening - Comments:: aids Vision Screening - Comments:: No glasses   Dietary issues and exercise activities discussed: Current Exercise Habits: The patient does not participate in regular exercise at present, Exercise limited by: orthopedic condition(s) (back pain and leg pain)   Goals Addressed             This Visit's Progress    Patient Stated       Be painfree.       Depression Screen    10/05/2022   10:46 AM 10/02/2021    1:24 PM 10/28/2020    9:50 AM 10/01/2020    1:10 PM 08/23/2019    9:02 AM 07/01/2018    9:22 AM 01/07/2017   11:59 AM  PHQ 2/9 Scores  PHQ - 2 Score 0 0 0 0 0 0 0  PHQ- 9 Score   5 0 0 0     Fall Risk    10/05/2022   10:37 AM 10/05/2022    3:25 AM 10/02/2021    1:23 PM 10/28/2020    9:02 AM 10/01/2020    1:10 PM  Fall Risk   Falls in the past year? 1 1 0 0 0  Number falls in past yr: 1 1 0  0  Comment trips in yard, now uses cane  when outside      Injury with Fall? 0 0 0  0  Risk for fall due to : Impaired balance/gait  No Fall Risks  Medication side effect  Risk for fall due to: Comment Left leg problem      Follow up Falls evaluation completed;Education provided;Falls prevention discussed  Falls prevention discussed  Falls evaluation completed;Falls prevention discussed    FALL RISK PREVENTION PERTAINING TO THE HOME:  Any stairs in or around the home? Yes  If so, are there any without handrails? No  Home free of loose throw rugs in walkways, pet beds, electrical cords, etc? Yes  Adequate lighting in your home to reduce risk of falls? Yes   ASSISTIVE DEVICES UTILIZED TO PREVENT FALLS:  Life alert? No  Use of a cane, walker or w/c? Yes  Grab bars in the bathroom? No  Shower chair or bench in shower? Yes  Elevated toilet seat or a handicapped toilet? Yes    Cognitive Function:    10/01/2020    1:13 PM 08/23/2019    9:05 AM 07/01/2018    9:23 AM  MMSE - Mini Mental State Exam  Not completed: Refused    Orientation to time  5 5  Orientation to Place  5 5  Registration  3 3  Attention/ Calculation  5 0  Recall  3 1  Recall-comments   unable to recall 2 of 3 words  Language- name 2 objects   0  Language- repeat  1 1  Language- follow 3 step command   3  Language- read & follow direction   0  Write a sentence   0  Copy design   0  Total score   18        10/05/2022   10:48 AM  6CIT Screen  What Year? 0 points  What month? 0 points  What time? 0 points  Count back from 20 0 points  Months in reverse 0 points  Repeat phrase 0 points  Total Score 0 points    Immunizations Immunization History  Administered Date(s) Administered   Fluad Quad(high Dose 65+) 04/06/2019   Influenza, Seasonal, Injecte, Preservative Fre 08/02/2012, 04/26/2013   Influenza,inj,Quad PF,6+ Mos 04/30/2014, 08/14/2015, 03/24/2016, 05/19/2017, 04/20/2018   Influenza-Unspecified 04/30/2014, 08/14/2015, 03/24/2016,  05/19/2017, 04/20/2018   Janssen (J&J) SARS-COV-2 Vaccination 11/01/2019   Pneumococcal Conjugate-13 01/07/2017   Pneumococcal Polysaccharide-23 07/01/2018   Td 07/27/2012   Zoster, Live 04/26/2013    TDAP status: Patient wishes to receive this vaccine today after disclosing that insurance does not cover the vaccine as a preventative vaccine.   Flu Vaccine status: Up to date  Pneumococcal vaccine status: Up to date  Covid-19 vaccine status: Declined, Education has been provided regarding the importance of this vaccine but patient still declined. Advised may receive this vaccine at local pharmacy or Health Dept.or vaccine clinic. Aware to provide a copy of the vaccination record if obtained from local pharmacy or Health Dept. Verbalized acceptance and understanding.  Qualifies for Shingles Vaccine? Yes   Zostavax completed No   Shingrix Completed?: No.    Education has been provided regarding the importance of this vaccine. Patient has been advised to call insurance company to determine out of pocket expense if they have not yet received this vaccine. Advised may also receive vaccine at local pharmacy or Health Dept. Verbalized acceptance and understanding.  Screening Tests Health Maintenance  Topic Date Due   Zoster Vaccines- Shingrix (1 of 2) Never done   COVID-19 Vaccine (2 - 2023-24 season) 03/27/2022   INFLUENZA VACCINE  10/25/2022 (Originally 02/24/2022)   Medicare Annual Wellness (AWV)  10/05/2023   COLONOSCOPY (Pts 45-47yr Insurance coverage will need to be confirmed)  12/18/2023   Pneumonia Vaccine 72 Years old  Completed   Hepatitis C Screening  Completed   HPV VACCINES  Aged Out   DTaP/Tdap/Td  Discontinued    Health Maintenance  Health Maintenance Due  Topic Date Due   Zoster Vaccines- Shingrix (1 of 2) Never done   COVID-19 Vaccine (2 - 2023-24 season) 03/27/2022    Colorectal cancer screening: Type of screening: Colonoscopy. Completed 12/17/20. Repeat every 3  years  Lung Cancer Screening: (Low Dose CT Chest recommended if Age 72-80years, 30 pack-year currently smoking OR have quit w/in 15years.) does not qualify.   Lung Cancer Screening Referral: no  Additional Screening:  Hepatitis C Screening: does not qualify; Completed  01/04/2017  Vision Screening: Recommended annual ophthalmology exams for early detection of glaucoma and other disorders of the eye. Is the patient up to date with their annual eye exam?  Yes  Who is the provider or what is the name of the office in which the patient attends annual eye exams? [facility] If pt is not established with a provider, would they like to be referred to a provider to establish care? No .   Dental Screening: Recommended annual dental exams for proper oral hygiene  Community Resource Referral / Chronic Care Management: CRR required this visit?  No   CCM required this visit?  No      Plan:     I have personally reviewed and noted the following in the patient's chart:   Medical and social history Use of alcohol, tobacco or illicit drugs  Current medications and supplements including opioid prescriptions. Patient is not currently taking opioid prescriptions. Functional ability and status Nutritional status Physical activity Advanced directives List of other physicians Hospitalizations, surgeries, and ER visits in previous 12 months Vitals Screenings to include cognitive, depression, and falls Referrals and appointments  In addition, I have reviewed and discussed with patient certain preventive protocols, quality metrics, and best practice recommendations. A written personalized care plan for preventive services as well as general preventive health recommendations were provided to patient.     Lebron Conners, LPN   [id number]   Nurse Notes: none

## 2022-10-05 NOTE — Patient Instructions (Signed)
Randy Kennedy , Thank you for taking time to come for your Medicare Wellness Visit. I appreciate your ongoing commitment to your health goals. Please review the following plan we discussed and let me know if I can assist you in the future.   These are the goals we discussed:  Goals      Blood Pressure < 130/80     Patient Stated     10/01/2020, I will maintain and continue medications as prescribed.     Patient Stated     Would like to lose weight      Patient Stated     Be painfree.        This is a list of the screening recommended for you and due dates:  Health Maintenance  Topic Date Due   Zoster (Shingles) Vaccine (1 of 2) Never done   COVID-19 Vaccine (2 - 2023-24 season) 03/27/2022   Flu Shot  10/25/2022*   Medicare Annual Wellness Visit  10/05/2023   Colon Cancer Screening  12/18/2023   Pneumonia Vaccine  Completed   Hepatitis C Screening: USPSTF Recommendation to screen - Ages 18-79 yo.  Completed   HPV Vaccine  Aged Out   DTaP/Tdap/Td vaccine  Discontinued  *Topic was postponed. The date shown is not the original due date.    Advanced directives: Please bring a copy of your health care power of attorney and living will to the office to be added to your chart at your convenience.   Conditions/risks identified: Aim for 30 minutes of exercise or brisk walking, 6-8 glasses of water, and 5 servings of fruits and vegetables each day.   Next appointment: Follow up in one year for your annual wellness visit. 10/07/2023 @ 11:45 via telephone.  Preventive Care 72 Years and Older, Male  Preventive care refers to lifestyle choices and visits with your health care provider that can promote health and wellness. What does preventive care include? A yearly physical exam. This is also called an annual well check. Dental exams once or twice a year. Routine eye exams. Ask your health care provider how often you should have your eyes checked. Personal lifestyle choices,  including: Daily care of your teeth and gums. Regular physical activity. Eating a healthy diet. Avoiding tobacco and drug use. Limiting alcohol use. Practicing safe sex. Taking low doses of aspirin every day. Taking vitamin and mineral supplements as recommended by your health care provider. What happens during an annual well check? The services and screenings done by your health care provider during your annual well check will depend on your age, overall health, lifestyle risk factors, and family history of disease. Counseling  Your health care provider may ask you questions about your: Alcohol use. Tobacco use. Drug use. Emotional well-being. Home and relationship well-being. Sexual activity. Eating habits. History of falls. Memory and ability to understand (cognition). Work and work Statistician. Screening  You may have the following tests or measurements: Height, weight, and BMI. Blood pressure. Lipid and cholesterol levels. These may be checked every 5 years, or more frequently if you are over 34 years old. Skin check. Lung cancer screening. You may have this screening every year starting at age 72 if you have a 30-pack-year history of smoking and currently smoke or have quit within the past 15 years. Fecal occult blood test (FOBT) of the stool. You may have this test every year starting at age 72. Flexible sigmoidoscopy or colonoscopy. You may have a sigmoidoscopy every 5 years or a colonoscopy  every 10 years starting at age 72. Prostate cancer screening. Recommendations will vary depending on your family history and other risks. Hepatitis C blood test. Hepatitis B blood test. Sexually transmitted disease (STD) testing. Diabetes screening. This is done by checking your blood sugar (glucose) after you have not eaten for a while (fasting). You may have this done every 1-3 years. Abdominal aortic aneurysm (AAA) screening. You may need this if you are a current or former  smoker. Osteoporosis. You may be screened starting at age 72 if you are at high risk. Talk with your health care provider about your test results, treatment options, and if necessary, the need for more tests. Vaccines  Your health care provider may recommend certain vaccines, such as: Influenza vaccine. This is recommended every year. Tetanus, diphtheria, and acellular pertussis (Tdap, Td) vaccine. You may need a Td booster every 10 years. Zoster vaccine. You may need this after age 66. Pneumococcal 13-valent conjugate (PCV13) vaccine. One dose is recommended after age 28. Pneumococcal polysaccharide (PPSV23) vaccine. One dose is recommended after age 72. Talk to your health care provider about which screenings and vaccines you need and how often you need them. This information is not intended to replace advice given to you by your health care provider. Make sure you discuss any questions you have with your health care provider. Document Released: 08/09/2015 Document Revised: 04/01/2016 Document Reviewed: 05/14/2015 Elsevier Interactive Patient Education  2017 [employer] Prevention in the Home Falls can cause injuries. They can happen to people of all ages. There are many things you can do to make your home safe and to help prevent falls. What can I do on the outside of my home? Regularly fix the edges of walkways and driveways and fix any cracks. Remove anything that might make you trip as you walk through a door, such as a raised step or threshold. Trim any bushes or trees on the path to your home. Use bright outdoor lighting. Clear any walking paths of anything that might make someone trip, such as rocks or tools. Regularly check to see if handrails are loose or broken. Make sure that both sides of any steps have handrails. Any raised decks and porches should have guardrails on the edges. Have any leaves, snow, or ice cleared regularly. Use sand or salt on walking paths during  winter. Clean up any spills in your garage right away. This includes oil or grease spills. What can I do in the bathroom? Use night lights. Install grab bars by the toilet and in the tub and shower. Do not use towel bars as grab bars. Use non-skid mats or decals in the tub or shower. If you need to sit down in the shower, use a plastic, non-slip stool. Keep the floor dry. Clean up any water that spills on the floor as soon as it happens. Remove soap buildup in the tub or shower regularly. Attach bath mats securely with double-sided non-slip rug tape. Do not have throw rugs and other things on the floor that can make you trip. What can I do in the bedroom? Use night lights. Make sure that you have a light by your bed that is easy to reach. Do not use any sheets or blankets that are too big for your bed. They should not hang down onto the floor. Have a firm chair that has side arms. You can use this for support while you get dressed. Do not have throw rugs and other things on  the floor that can make you trip. What can I do in the kitchen? Clean up any spills right away. Avoid walking on wet floors. Keep items that you use a lot in easy-to-reach places. If you need to reach something above you, use a strong step stool that has a grab bar. Keep electrical cords out of the way. Do not use floor polish or wax that makes floors slippery. If you must use wax, use non-skid floor wax. Do not have throw rugs and other things on the floor that can make you trip. What can I do with my stairs? Do not leave any items on the stairs. Make sure that there are handrails on both sides of the stairs and use them. Fix handrails that are broken or loose. Make sure that handrails are as long as the stairways. Check any carpeting to make sure that it is firmly attached to the stairs. Fix any carpet that is loose or worn. Avoid having throw rugs at the top or bottom of the stairs. If you do have throw rugs,  attach them to the floor with carpet tape. Make sure that you have a light switch at the top of the stairs and the bottom of the stairs. If you do not have them, ask someone to add them for you. What else can I do to help prevent falls? Wear shoes that: Do not have high heels. Have rubber bottoms. Are comfortable and fit you well. Are closed at the toe. Do not wear sandals. If you use a stepladder: Make sure that it is fully opened. Do not climb a closed stepladder. Make sure that both sides of the stepladder are locked into place. Ask someone to hold it for you, if possible. Clearly mark and make sure that you can see: Any grab bars or handrails. First and last steps. Where the edge of each step is. Use tools that help you move around (mobility aids) if they are needed. These include: Canes. Walkers. Scooters. Crutches. Turn on the lights when you go into a dark area. Replace any light bulbs as soon as they burn out. Set up your furniture so you have a clear path. Avoid moving your furniture around. If any of your floors are uneven, fix them. If there are any pets around you, be aware of where they are. Review your medicines with your doctor. Some medicines can make you feel dizzy. This can increase your chance of falling. Ask your doctor what other things that you can do to help prevent falls. This information is not intended to replace advice given to you by your health care provider. Make sure you discuss any questions you have with your health care provider. Document Released: 05/09/2009 Document Revised: 12/19/2015 Document Reviewed: 08/17/2014 Elsevier Interactive Patient Education  2017 [employer].

## 2022-10-07 NOTE — Progress Notes (Signed)
Remote pacemaker transmission.   

## 2022-10-08 DIAGNOSIS — G8929 Other chronic pain: Secondary | ICD-10-CM | POA: Diagnosis not present

## 2022-10-08 DIAGNOSIS — M5442 Lumbago with sciatica, left side: Secondary | ICD-10-CM | POA: Diagnosis not present

## 2022-10-11 ENCOUNTER — Other Ambulatory Visit: Payer: Self-pay | Admitting: Cardiovascular Disease

## 2022-10-11 DIAGNOSIS — I10 Essential (primary) hypertension: Secondary | ICD-10-CM

## 2022-10-12 NOTE — Telephone Encounter (Signed)
Refill request

## 2022-10-12 NOTE — Telephone Encounter (Signed)
Rx request sent to pharmacy.  

## 2022-10-14 DIAGNOSIS — M5442 Lumbago with sciatica, left side: Secondary | ICD-10-CM | POA: Diagnosis not present

## 2022-10-14 DIAGNOSIS — G8929 Other chronic pain: Secondary | ICD-10-CM | POA: Diagnosis not present

## 2022-10-21 DIAGNOSIS — G8929 Other chronic pain: Secondary | ICD-10-CM | POA: Diagnosis not present

## 2022-10-21 DIAGNOSIS — M5442 Lumbago with sciatica, left side: Secondary | ICD-10-CM | POA: Diagnosis not present

## 2022-10-27 ENCOUNTER — Telehealth: Payer: Self-pay

## 2022-10-27 NOTE — Progress Notes (Signed)
Care Management & Coordination Services Pharmacy Team  Reason for Encounter: Hypertension   Contacted patient to discuss hypertension disease state. {US HC Outreach:28874}   Current antihypertensive regimen:  Coreg 25 mg twice daily  Hydralazine 150 three times daily Minoxidil 2.5 mg daily Diovan 320 mg daily Furosemide 20 mg 3 times per week - Discontinued 10/05/22  Patient verbally confirms he is taking the above medications as directed. {yes/no:20286}  How often are you checking your Blood Pressure? {CHL HP BP Monitoring [phone]}  he checks his blood pressure {timing:25218} {before/after:25217} taking his medication.  Current home BP readings: ***  DATE:             BP               PULSE   Wrist or arm cuff: Caffeine intake: Salt intake: OTC medications including pseudoephedrine or NSAIDs?  Any readings above 180/100? {yes/no:20286} If yes any symptoms of hypertensive emergency? {hypertensive emergency symptoms:25354}  What recent interventions/DTPs have been made by any provider to improve Blood Pressure control since last CPP Visit:  Start taking spironolactone 12.5 mg daily  Decrease hydralazine from 150 mg three times daily to 100 mg three times daily    Any recent hospitalizations or ED visits since last visit with CPP? No  What diet changes have been made to improve Blood Pressure Control?  ***  What exercise is being done to improve your Blood Pressure Control?  ***  Adherence Review: Is the patient currently on ACE/ARB medication? Yes Does the patient have >5 day gap between last estimated fill dates? {yes/no:20286}  Star Rating Drugs:  Medication:  Last Fill: Day Supply Gabapentin 300 mg  Pravastatin 40 mg Valsartan 320 mg  Chart Updates: Recent office visits:  10/05/22 AWV  Recent consult visits:  10/08/22 Outpatient Rehab 09/10/22 Meade Maw, MD (Neurosurgery) Anterolisthesis Referral: Physical Therapy F/U 8  weeks 08/21/22 Rollen Sox, [facility] HTN  F/U 1 month 08/13/22 MRI Lumbar Spine and XR Chest PA and Lateral 07/28/22 Girtha Hake, MD Chronic low back pain Ordered: MRI Lumbar Spine F/U PRN 07/13/22 Cammy Copa HTN Labs: "K level and renal function WNL, patient informed via phone. Reports BP is still in 150/90 range, advice to increase spironolactone dose from 12.5 mg daily to 25 mg daily" Start: Spironolactone 12.5 mg  Hospital visits:  None in previous 6 months  Medications: Outpatient Encounter Medications as of 10/27/2022  Medication Sig Note   aspirin EC 81 MG tablet Take 81 mg by mouth daily.    carvedilol (COREG) 25 MG tablet Take 1 tablet (25 mg total) by mouth 2 (two) times daily with a meal.    colchicine 0.6 MG tablet TAKE ONE TABLET BY MOUTH ONCE DAILY    fluticasone (FLONASE) 50 MCG/ACT nasal spray Place 2 sprays into both nostrils daily.    furosemide (LASIX) 20 MG tablet TAKE ONE TABLET BY MOUTH three DAYS A WEEK    gabapentin (NEURONTIN) 300 MG capsule TAKE ONE CAPSULE BY MOUTH TWICE DAILY and TAKE TWO CAPSULES EVERYDAY AT BEDTIME (Patient taking differently: See admin instructions. Take one capsule by mouth twice daily and take two capsules everyday at bedtime)    hydrALAZINE (APRESOLINE) 100 MG tablet TAKE 1 AND 1/2 TABLETS BY MOUTH THREE TIMES DAILY, MAY increase TO FOUR TIMES DAILY AS NEEDED FOR HIGH BLOOD PRESSURE 10/05/2022: Taking 1 tid   meloxicam (MOBIC) 15 MG tablet TAKE 1 TABLET BY MOUTH ONCE DAILY AS NEEDED FOR  GOUT  FLARE (Patient not taking:  Reported on 10/05/2022)    minoxidil (LONITEN) 2.5 MG tablet TAKE ONE TABLET BY MOUTH EVERYDAY AT BEDTIME    nitroGLYCERIN (NITROSTAT) 0.4 MG SL tablet Place 1 tablet (0.4 mg total) under the tongue every 5 (five) minutes as needed for chest pain.    pravastatin (PRAVACHOL) 40 MG tablet Take 1 tablet (40 mg total) by mouth daily.    predniSONE (DELTASONE) 20 MG tablet Take 2 tablets (40 mg total) by mouth daily. (Patient  not taking: Reported on 10/05/2022)    probenecid (BENEMID) 500 MG tablet TAKE 1/2 TABLET BY MOUTH TWICE DAILY (Patient taking differently: Take 250 mg by mouth 2 (two) times daily. TAKE 1/2 TABLET BY MOUTH TWICE DAILY)    spironolactone (ALDACTONE) 25 MG tablet TAKE 1/2 TABLET BY MOUTH ONCE DAILY    valsartan (DIOVAN) 320 MG tablet Take 1 tablet (320 mg total) by mouth daily.    vitamin C (ASCORBIC ACID) 500 MG tablet Take 500 mg by mouth daily.    No facility-administered encounter medications on file as of 10/27/2022.    Recent Office Vitals: BP Readings from Last 3 Encounters:  09/10/22 (!) 140/82  08/21/22 126/73  07/13/22 (!) 144/85   Pulse Readings from Last 3 Encounters:  08/21/22 63  07/13/22 63  06/04/22 60    Wt Readings from Last 3 Encounters:  10/05/22 228 lb (103.4 kg)  09/10/22 233 lb 6.4 oz (105.9 kg)  06/03/22 228 lb (103.4 kg)     Kidney Function Lab Results  Component Value Date/Time   CREATININE 1.09 08/03/2022 11:21 AM   CREATININE 1.16 05/01/2022 11:45 AM   CREATININE 1.22 03/25/2014 09:27 AM   CREATININE 1.06 02/09/2012 12:00 AM   GFR 62.18 03/18/2022 11:42 AM   GFRNONAA >60 02/26/2022 03:15 AM   GFRNONAA >60 03/25/2014 09:27 AM   GFRAA >60 10/04/2017 08:05 AM   GFRAA >60 03/25/2014 09:27 AM       Latest Ref Rng & Units 08/03/2022   11:21 AM 05/01/2022   11:45 AM 03/18/2022   11:42 AM  BMP  Glucose 70 - 99 mg/dL 112  103  96   BUN 8 - 27 mg/dL 16  17  23    Creatinine 0.76 - 1.27 mg/dL 1.09  1.16  1.18   BUN/Creat Ratio 10 - 24 15  15     Sodium 134 - 144 mmol/L 139  141  139   Potassium 3.5 - 5.2 mmol/L 4.0  4.6  4.0   Chloride 96 - 106 mmol/L 101  100  99   CO2 20 - 29 mmol/L 22  26  28    Calcium 8.6 - 10.2 mg/dL 9.4  9.4  9.1     Charlene Brooke, PharmD notified  Marijean Niemann, [place] Assistant [phone]

## 2022-11-04 DIAGNOSIS — M5442 Lumbago with sciatica, left side: Secondary | ICD-10-CM | POA: Diagnosis not present

## 2022-11-04 DIAGNOSIS — G8929 Other chronic pain: Secondary | ICD-10-CM | POA: Diagnosis not present

## 2022-11-04 NOTE — Progress Notes (Signed)
Called patient for blood pressure readings as previously discussed. No answer; left message.  Al Corpus, PharmD notified  Claudina Lick, [place] Clinical Pharmacy Assistant [phone]

## 2022-11-04 NOTE — H&P (View-Only) (Signed)
Called patient for blood pressure readings as previously discussed. No answer; left message.  Lindsey Foltanski, PharmD notified  Asaf Elmquist, RMA Clinical Pharmacy Assistant [phone]   

## 2022-11-05 ENCOUNTER — Ambulatory Visit (INDEPENDENT_AMBULATORY_CARE_PROVIDER_SITE_OTHER): Payer: PPO | Admitting: Neurosurgery

## 2022-11-05 ENCOUNTER — Encounter: Payer: Self-pay | Admitting: Neurosurgery

## 2022-11-05 VITALS — BP 134/84 | Ht 67.0 in | Wt 229.6 lb

## 2022-11-05 DIAGNOSIS — M4316 Spondylolisthesis, lumbar region: Secondary | ICD-10-CM | POA: Diagnosis not present

## 2022-11-05 DIAGNOSIS — M5442 Lumbago with sciatica, left side: Secondary | ICD-10-CM

## 2022-11-05 DIAGNOSIS — G8929 Other chronic pain: Secondary | ICD-10-CM | POA: Diagnosis not present

## 2022-11-05 DIAGNOSIS — M5441 Lumbago with sciatica, right side: Secondary | ICD-10-CM | POA: Diagnosis not present

## 2022-11-05 NOTE — Progress Notes (Signed)
Referring Physician:  Eustaquio Boyden, MD [address] [place],  [place] [address]  Primary Physician:  Eustaquio Boyden, MD  History of Present Illness: 11/05/2022 [name].  He has been seeing a physical therapist without significant improvement.  09/10/2022 [name] is here today with a chief complaint of low back pain that radiates into the bilateral legs. The right leg pain stops at the knee, the pain in the left leg goes all the way down to the foot.   He had a severe accident involving a fall from height approximately 20 years ago.  He has used a cane since that time.  He has had back pain for the past 10 years though it has been worsening over time.  He gets severe pain in his buttocks bilaterally and down his left leg when he stands or walks.  Lifting and activity makes it worse.  Sitting improves his pain.  His pain is constant and has been a major issue for some time.   Bowel/Bladder Dysfunction: none  Conservative measures: Seen a Chiropractor  Physical therapy: has not participated in within the past year Multimodal medical therapy including regular antiinflammatories:  gabapentin, meloxicam, prednisone, hydrocodone  Injections:  has received epidural steroid injections 11/15/20: Left L4-5 TF ESI(no relief) 10/10/20: Left L5-S1 TF ESI (50%relief)  Past Surgery: denies  Randy Kennedy has no symptoms of cervical myelopathy.  The symptoms are causing a significant impact on the patient's life.   I have utilized the care everywhere function in epic to review the outside records available from external health systems.  Review of Systems:  A 10 point review of systems is negative, except for the pertinent positives and negatives detailed in the HPI.  Past Medical History: Past Medical History:  Diagnosis Date   Allergic rhinitis    Arthritis    Chest pain    a. 11/2011 reportedly normal  stress test performed in [place].   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 moderate, AHI 37.4 increased to 80 in REM, on BiPAP 14/10, 97% compliance >4 hrs (06/2013); b. Does not tolerate CPAP.   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 Treated with vicodin, prior tried fentanyl and butrans.   Systolic murmur    Wears dentures    Partial lower   Wears hearing aid in both ears    Does  not wear all the time    Past Surgical History: Past Surgical History:  Procedure Laterality Date   CATARACT EXTRACTION W/PHACO Right 11/26/2020   Procedure: CATARACT EXTRACTION PHACO AND INTRAOCULAR LENS PLACEMENT (IOC) RIGHT VIVITY TORIC LENS 7.17 00:49.6;  Surgeon: Galen Manila, MD;  Location: [facility];  Service: Ophthalmology;  Laterality: Right;  Latex Sleep Apnea   CATARACT EXTRACTION W/PHACO Left 12/10/2020   Procedure: CATARACT EXTRACTION PHACO AND INTRAOCULAR LENS PLACEMENT (IOC) LEFT VIVITY;  Surgeon: Galen Manila, MD;  Location: [facility];  Service: Ophthalmology;  Laterality: Left;  Latex Sleep  apnea 5.37 00:32.7   COLONOSCOPY  02/2011   ext hem, benign polyp, rpt 5 yrs ([place])   COLONOSCOPY WITH PROPOFOL N/A 12/17/2020   multiple TA, ext hem, rpt 3 yrs ([place], Loel Dubonnet, MD)   LEG SURGERY  x5   left - after fall at work (3.5 stories)   MANDIBLE SURGERY  1965   jaw fracture - horse kick   MOHS SURGERY Right 07/03/2021   Ear   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   Procedure: PACEMAKER IMPLANT;  Surgeon: Regan Lemming, MD;  Location: [facility];  Service: Cardiovascular;  Laterality: N/A;   TYMPANIC MEMBRANE REPAIR Left 1994   otosclerosis    Allergies: Allergies as of 11/05/2022 - Review Complete 11/05/2022  Allergen Reaction Noted   Amlodipine Rash 06/24/2021   Allopurinol Other (See Comments) 09/13/2012   Lisinopril-hydrochlorothiazide  02/16/2013   Procardia [nifedipine] Other (See Comments) 07/12/2013   Spironolactone Other (See Comments) 05/28/2017   Clonidine derivatives Rash 02/25/2022   Doxazosin mesylate Rash 10/07/2017   Latex Rash 11/14/2020   Nortriptyline Rash 07/01/2018   Uloric [febuxostat] Rash 05/07/2017    Medications: Current Meds  Medication Sig   aspirin EC 81 MG tablet Take 81 mg by mouth daily.   carvedilol (COREG) 25 MG tablet Take 1 tablet (25 mg total) by mouth 2 (two) times daily with a meal.   colchicine 0.6 MG tablet TAKE ONE TABLET BY MOUTH ONCE DAILY   fluticasone (FLONASE) 50 MCG/ACT nasal spray Place 2 sprays into both nostrils daily.   furosemide (LASIX) 20 MG tablet TAKE ONE TABLET BY MOUTH three DAYS A WEEK   gabapentin (NEURONTIN) 300 MG capsule TAKE ONE CAPSULE BY MOUTH TWICE DAILY and TAKE TWO CAPSULES EVERYDAY AT BEDTIME (Patient taking differently: See admin instructions. Take one capsule by mouth twice daily and take two capsules everyday at bedtime)   hydrALAZINE (APRESOLINE) 100 MG tablet TAKE 1 AND 1/2 TABLETS BY MOUTH THREE TIMES DAILY, MAY increase TO FOUR TIMES DAILY AS NEEDED FOR HIGH BLOOD PRESSURE    meloxicam (MOBIC) 15 MG tablet TAKE 1 TABLET BY MOUTH ONCE DAILY AS NEEDED FOR  GOUT  FLARE   minoxidil (LONITEN) 2.5 MG tablet TAKE ONE TABLET BY MOUTH EVERYDAY AT BEDTIME   nitroGLYCERIN (NITROSTAT) 0.4 MG SL tablet Place 1 tablet (0.4 mg total) under the tongue every 5 (five) minutes as needed for chest pain.   pravastatin (PRAVACHOL) 40 MG tablet Take 1 tablet (40 mg total) by mouth daily.   predniSONE (DELTASONE) 20 MG tablet Take 2 tablets (40 mg total) by mouth daily.   probenecid (BENEMID) 500 MG tablet TAKE 1/2 TABLET BY MOUTH TWICE DAILY (Patient taking differently: Take 250 mg by mouth 2 (two) times daily. TAKE 1/2 TABLET BY MOUTH TWICE DAILY)   spironolactone (ALDACTONE) 25 MG tablet TAKE 1/2 TABLET BY MOUTH ONCE DAILY   valsartan (DIOVAN) 320 MG tablet  Take 1 tablet (320 mg total) by mouth daily.   vitamin C (ASCORBIC ACID) 500 MG tablet Take 500 mg by mouth daily.    Social History: Social History   Tobacco Use   Smoking status: Former    Packs/day: 1.00    Years: 4.00    Additional pack years: 0.00    Total pack years: 4.00    Types: Pipe, Cigarettes    Quit date: 07/27/1978    Years since quitting: 44.3   Smokeless tobacco: Never  Vaping Use   Vaping Use: Never used  Substance Use Topics   Alcohol use: No    Alcohol/week: 0.0 standard drinks of alcohol   Drug use: No    Family Medical History: Family History  Problem Relation Age of Onset   Cancer Father [id number]       prostate   Cancer Mother        rectal   Hypertension Mother    Cancer Paternal Uncle        prostate   Diabetes Neg Hx    Stroke Neg Hx    CAD Neg Hx     Physical Examination: Vitals:   11/05/22 0947  BP: 134/84    General: Patient is well developed, well nourished, calm, collected, and in no apparent distress. Attention to examination is appropriate.  Neck:   Supple.  Full range of motion.  Respiratory: Patient is breathing without any difficulty.   NEUROLOGICAL:     Awake, alert,  oriented to person, place, and time.  Speech is clear and fluent.   Cranial Nerves: Pupils equal round and reactive to light.  Facial tone is symmetric.  Facial sensation is symmetric. Shoulder shrug is symmetric. Tongue protrusion is midline.  There is no pronator drift.  ROM of spine: full.    Strength: Side Biceps Triceps Deltoid Interossei Grip Wrist Ext. Wrist Flex.  R 5 5 5 5 5 5 5   L 5 5 5 5 5 5 5    Side Iliopsoas Quads Hamstring PF DF EHL  R 5 5 5 5 5 5   L 5 5 5 5 4 4    Reflexes are 1+ and symmetric at the biceps, triceps, brachioradialis, patella and achilles.   Hoffman's is absent.   Bilateral upper and lower extremity sensation is intact to light touch.    No evidence of dysmetria noted.  Gait is antalgic and requires a cane.     Medical Decision Making  Imaging: MRI L spine 08/13/2022 T12-L1: Disc bulge. No spinal canal or neuroforaminal narrowing.    L1-L2: Unremarkable.  L2-L3: Disc bulge with facet arthrosis and ligamentum flavum thickening. No  spinal canal stenosis. Mild bilateral neuroforaminal narrowing    L3-L4: Disc bulge with bilateral facet arthrosis and ligamentum flavum  thickening. There is mild spinal canal stenosis. Mild bilateral  neuroforaminal narrowing.    L4-L5: There is uncovering of the intervertebral disc with a disc bulge.  Bilateral advanced facet arthrosis and ligamentum flavum thickening. There  is severe spinal canal stenosis with moderate bilateral neuroforaminal  narrowing.    L5-S1: Bilateral facet arthrosis. No spinal canal or neuroforaminal  narrowing.      IMPRESSION:  Grade 1 anterior listhesis of L4-L5. 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    Electronically Signed by:  Charlotte Saneshristopher Lascola, MD, [facility]  Electronically Signed on:  08/13/2022 8:23 PM   I have personally reviewed the images and agree with the above interpretation.  Assessment and  Plan: Randy Kennedy is a pleasant 72 y.o. male with anterolisthesis at L4-5 causing severe spinal stenosis.  This is likely degenerative in nature and causes back pain as well as bilateral sciatica.  He has not seen significant improvement with physical therapy.  I will reach out to his physical therapist to see whether he feels he is an appropriate candidate to continue therapy for now or if he is discharged.  If physical therapy does not improve his condition, I think an L4-5 transforaminal lumbar interbody fusion is appropriate given his anterolisthesis as well as his back pain with sciatica.  I spent a total of 10 minutes in this patient's care today. This time was spent reviewing pertinent records including imaging studies, obtaining and confirming history, performing a directed evaluation, formulating and discussing my recommendations, and documenting the visit within the medical record.    Thank you for involving me in the care of this patient.      Rudi Knippenberg K. Myer Haff MD, [facility]

## 2022-11-12 ENCOUNTER — Telehealth: Payer: Self-pay

## 2022-11-12 ENCOUNTER — Other Ambulatory Visit: Payer: Self-pay

## 2022-11-12 DIAGNOSIS — Z01818 Encounter for other preprocedural examination: Secondary | ICD-10-CM

## 2022-11-12 DIAGNOSIS — M4316 Spondylolisthesis, lumbar region: Secondary | ICD-10-CM

## 2022-11-12 DIAGNOSIS — Z981 Arthrodesis status: Secondary | ICD-10-CM

## 2022-11-12 NOTE — Telephone Encounter (Signed)
I spoke with Randy Kennedy. We discussed the following:   Planned surgery: L4-5 transforaminal lumbar interbody fusion   Surgery date: 11/27/22 - you will find out your arrival time the business day before your surgery.   Pre-op appointment at [facility] Pre-admit Testing: we will call you with a date/time for this. Pre-admit testing is located on the first floor of the [facility], [address] [facility] [address], [address]. Please bring all prescriptions in the original prescription bottles to your appointment, even if you have reviewed medications by phone with a pharmacy representative. Pre-op labs may be done at your pre-op appointment. You are not required to fast for these labs. Should you need to change your pre-op appointment, please call Pre-admit testing at [phone].    Surgical clearance: we will send a form to Dr Sharen Hones    Blood thinners: ok to stay on aspirin      NSAIDS (Non-steroidal anti-inflammatory drugs): because you are having a fusion, no NSAIDS (such as ibuprofen, aleve, naproxen, meloxicam, diclofenac) for 3 months after surgery. Celebrex is an exception. Tylenol is ok because it is not an NSAID.    Home health physical therapy: [facility] will contact you regarding home health physical therapy for after surgery.Their number is [phone].   Because you are having a fusion: for appointments after your 2 week follow-up: please arrive at the [facility] outpatient imaging center ([address], [place]) or [facility] one hour prior to your appointment for x-rays. This applies to every appointment after your 2 week follow-up. Failure to do so may result in your appointment being rescheduled.    We can be reached by phone or mychart 8am-4pm, Monday-Friday. If you have any questions/concerns before or after surgery, you can reach [place] at [phone], or you can send a mychart  message. If you have a concern after hours that cannot wait until normal business hours, you can call [phone] and ask to page the neurosurgeon on call for [place].   Appointments/FMLA & disability paperwork: Randy Kennedy & Randy Kennedy  Nurse: Randy Kennedy  Medical assistants: Randy Kennedy Physician Assistant's: Randy Kennedy & Randy Kennedy Surgeon: Randy Night, MD

## 2022-11-13 ENCOUNTER — Telehealth: Payer: Self-pay

## 2022-11-13 NOTE — Telephone Encounter (Signed)
Received faxed surgical clearance form from [place], RN/Dr. Myer Haff of [facility]. Pt needs L4-L5 minimally invasive (MIS) transforaminal lumbar interbody fusion (TLIF) w/general anesthesia. Surgery date- 11/27/22.  Lvm asking pt to call back. Need to schedule pre-op OV.    [Form is in basket on [employer].]

## 2022-11-13 NOTE — Telephone Encounter (Signed)
Looks like pt called back and scheduled OV on 11/16/22 at 9:00.

## 2022-11-16 ENCOUNTER — Encounter: Payer: Self-pay | Admitting: Family Medicine

## 2022-11-16 ENCOUNTER — Ambulatory Visit (INDEPENDENT_AMBULATORY_CARE_PROVIDER_SITE_OTHER)
Admission: RE | Admit: 2022-11-16 | Discharge: 2022-11-16 | Disposition: A | Discharge status: Home / Self Care | Payer: PPO | Source: Ambulatory Visit | Attending: Family Medicine | Admitting: Family Medicine

## 2022-11-16 ENCOUNTER — Ambulatory Visit (INDEPENDENT_AMBULATORY_CARE_PROVIDER_SITE_OTHER): Payer: PPO | Admitting: Family Medicine

## 2022-11-16 VITALS — BP 138/84 | HR 69 | Temp 97.3°F | Ht 67.0 in | Wt 233.2 lb

## 2022-11-16 DIAGNOSIS — Z01818 Encounter for other preprocedural examination: Secondary | ICD-10-CM | POA: Diagnosis not present

## 2022-11-16 DIAGNOSIS — F1190 Opioid use, unspecified, uncomplicated: Secondary | ICD-10-CM

## 2022-11-16 DIAGNOSIS — G4733 Obstructive sleep apnea (adult) (pediatric): Secondary | ICD-10-CM | POA: Diagnosis not present

## 2022-11-16 DIAGNOSIS — M1A09X0 Idiopathic chronic gout, multiple sites, without tophus (tophi): Secondary | ICD-10-CM | POA: Diagnosis not present

## 2022-11-16 DIAGNOSIS — J3089 Other allergic rhinitis: Secondary | ICD-10-CM

## 2022-11-16 DIAGNOSIS — I10 Essential (primary) hypertension: Secondary | ICD-10-CM | POA: Diagnosis not present

## 2022-11-16 DIAGNOSIS — Z950 Presence of cardiac pacemaker: Secondary | ICD-10-CM | POA: Diagnosis not present

## 2022-11-16 LAB — URIC ACID: Uric Acid, Serum: 5.8 mg/dL (ref 4.0–7.8)

## 2022-11-16 LAB — COMPREHENSIVE METABOLIC PANEL
ALT: 20 U/L (ref 0–53)
AST: 22 U/L (ref 0–37)
Albumin: 4.4 g/dL (ref 3.5–5.2)
Alkaline Phosphatase: 60 U/L (ref 39–117)
BUN: 20 mg/dL (ref 6–23)
CO2: 25 mEq/L (ref 19–32)
Calcium: 9.4 mg/dL (ref 8.4–10.5)
Chloride: 102 mEq/L (ref 96–112)
Creatinine, Ser: 1.2 mg/dL (ref 0.40–1.50)
GFR: 60.65 mL/min (ref >60.00)
Glucose, Bld: 116 mg/dL — ABNORMAL HIGH (ref 70–99)
Potassium: 3.8 mEq/L (ref 3.5–5.1)
Sodium: 137 mEq/L (ref 135–145)
Total Bilirubin: 0.6 mg/dL (ref 0.2–1.2)
Total Protein: 7.3 g/dL (ref 6.0–8.3)

## 2022-11-16 LAB — CBC WITH DIFFERENTIAL/PLATELET
Basophils Absolute: 0.1 10*3/uL (ref 0.0–0.1)
Basophils Relative: 1.1 % (ref 0.0–3.0)
Eosinophils Absolute: 0.3 10*3/uL (ref 0.0–0.7)
Eosinophils Relative: 5.2 % — ABNORMAL HIGH (ref 0.0–5.0)
HCT: 39.2 % (ref 39.0–52.0)
Hemoglobin: 13.9 g/dL (ref 13.0–17.0)
Lymphocytes Relative: 27.9 % (ref 12.0–46.0)
Lymphs Abs: 1.6 10*3/uL (ref 0.7–4.0)
MCHC: 35.4 g/dL (ref 30.0–36.0)
MCV: 91.3 fl (ref 78.0–100.0)
Monocytes Absolute: 0.5 10*3/uL (ref 0.1–1.0)
Monocytes Relative: 9.1 % (ref 3.0–12.0)
Neutro Abs: 3.3 10*3/uL (ref 1.4–7.7)
Neutrophils Relative %: 56.7 % (ref 43.0–77.0)
Platelets: 177 10*3/uL (ref 150.0–400.0)
RBC: 4.29 Mil/uL (ref 4.22–5.81)
RDW: 12.8 % (ref 11.5–15.5)
WBC: 5.9 10*3/uL (ref 4.0–10.5)

## 2022-11-16 LAB — PROTIME-INR
INR: 1 ratio (ref 0.8–1.0)
Prothrombin Time: 10.8 s (ref 9.6–13.1)

## 2022-11-16 MED ORDER — MONTELUKAST SODIUM 10 MG PO TABS: 10.0000 mg | ORAL_TABLET | Freq: Every day | ORAL | 3 refills | Status: DC

## 2022-11-16 NOTE — Progress Notes (Unsigned)
Ph: [phone]       Fax: [phone]   Patient ID: Randy Kennedy, male    DOB: 07-Jan-1951, 72 y.o.   MRN: [id number]  This visit was conducted in person.  BP 138/84 (BP Location: Right Arm, Cuff Size: Large)   Pulse 69   Temp (!) 97.3 F (36.3 C) (Temporal)   Ht 5\' 7"  (1.702 m)   Wt 233 lb 4 oz (105.8 kg)   SpO2 95%   BMI 36.53 kg/m   BP Readings from Last 3 Encounters:  11/19/22 122/80  11/18/22 122/71  11/16/22 138/84    CC: preop eval Subjective:   HPI: Randy Kennedy is a 72 y.o. male presenting on 11/16/2022 for Pre-op Exam (Pt needs L4-L5 minimally invasive (MIS) transforaminal lumbar interbody fusion (TLIF) w/general anesthesia. Surgery date- 11/27/22. Also, preovided [place] [employer]ing [employer]ac[place] form to be completed. ) and Labs Only   Randy Kennedy  has a past medical history of Allergic rhinitis, Arthritis, Bradycardia, Chest pain, Chronic fatigue, Chronic pain, Closed avulsion fracture of right talus (06/2016), COVID-19 (06/2020), Dyspnea, Eczema, Erectile dysfunction (09/14/2012), [place] disease, Gout (2007), Hearing loss, Heartburn, Hepatitis, History of chicken pox, History of hepatitis B, HLD (hyperlipidemia), HTN (hypertension), Leg edema, Lobar pneumonia (05/13/2022), Morbid obesity, Motion sickness, Narcolepsy (2006), Obesity, OSA (obstructive sleep apnea) (11/2011 sleep study), Pneumonia, RBBB, RLS (restless legs syndrome), 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  Planned upcoming minimally invasive L4/5 TF lumbar interbody fusion under general anesthesia by Neurosurgery Dr Myer Haff after failing PT.   Patient has tolerated anesthesia well in the past.  Has had 5 leg surgeries in 1980s.  Latest surgical intervention was pacemaker insertion 02/2022.  Denies trouble with post-op nausea/vomiting, or trouble awakening after  surgery.   Denies chest pain/tightness, dyspnea, palpitations, leg swelling, HA, dizziness.  No fevers/chills, coughing, abd pain, diarrhea, or UTI symptoms.   HTN - he sees cardiology advanced hypertension clinic as well as their pharmacy clinic to manage hypertension - he is on valsartan 320mg  daily, minoxidil 2.5mg  daily, hydralazine 100mg  TID, spironolactone 25mg  daily, and carvedilol 25mg  BID.  To see Dr Duke Salvia later this week - will receive cardiac clearance through her office.  Attributes elevated BP reading today to poor sleep due to significant nasal congestion from allergies. Chronic L sided pain also contributes to elevated BP.   Gout - managed with colchicine 0.6mg  daily and probenecid 250mg  BID. Allopurinol and Uloric allergy.   Chronic nasal congestion in setting of perennial allergies worsened since move to [place] 10 yrs ago - manages with allegra 180mg  daily (for the past 1-2 yrs). Flonase can cause worse congestion. Regularly uses nasal saline rinse. Significant itchy skin but no rash or hives. Triggers are weather change.   Also requests [employer] HC placard application filled out.  Lab Results  Component Value Date   HGBA1C 5.6 10/24/2020        Relevant past medical, surgical, family and social history reviewed and updated as indicated. Interim medical history since our last visit reviewed. Allergies and medications reviewed and updated. Outpatient Medications Prior to Visit  Medication Sig Dispense Refill   aspirin EC 81 MG tablet Take 81 mg by mouth daily.     carvedilol (COREG) 25 MG tablet Take 1 tablet (25 mg total) by mouth 2 (two) times daily with a meal. 180 tablet 3   colchicine 0.6 MG tablet TAKE  ONE TABLET BY MOUTH ONCE DAILY (Patient taking differently: Take 0.6 mg by mouth every morning.) 90 tablet 3   fluticasone (FLONASE) 50 MCG/ACT nasal spray Place 2 sprays into both nostrils daily. (Patient taking differently: Place 2 sprays into both nostrils as needed for  allergies.) 16 g 11   furosemide (LASIX) 20 MG tablet TAKE ONE TABLET BY MOUTH three DAYS A WEEK (Patient taking differently: Take 20 mg by mouth as needed for edema.) 39 tablet 3   gabapentin (NEURONTIN) 300 MG capsule TAKE ONE CAPSULE BY MOUTH TWICE DAILY and TAKE TWO CAPSULES EVERYDAY AT BEDTIME (Patient taking differently: Take 900 mg by mouth at bedtime.) 360 capsule 3   hydrALAZINE (APRESOLINE) 100 MG tablet TAKE 1 AND 1/2 TABLETS BY MOUTH THREE TIMES DAILY, MAY increase TO FOUR TIMES DAILY AS NEEDED FOR HIGH BLOOD PRESSURE (Patient taking differently: Take 100 mg by mouth 3 (three) times daily. TAKE 1 AND 1/2 TABLETS BY MOUTH THREE TIMES DAILY, MAY increase TO FOUR TIMES DAILY AS NEEDED FOR HIGH BLOOD PRESSURE) 270 tablet 3   HYDROcodone-acetaminophen (NORCO) 10-325 MG tablet Take 1 tablet by mouth 2 (two) times daily as needed for moderate pain (sparingly).     meloxicam (MOBIC) 15 MG tablet TAKE 1 TABLET BY MOUTH ONCE DAILY AS NEEDED FOR  GOUT  FLARE (Patient taking differently: Take 15 mg by mouth as needed.) 30 tablet 0   minoxidil (LONITEN) 2.5 MG tablet TAKE ONE TABLET BY MOUTH EVERYDAY AT BEDTIME (Patient taking differently: Take 2.5 mg by mouth at bedtime. TAKE ONE TABLET BY MOUTH EVERYDAY AT BEDTIME) 90 tablet 3   nitroGLYCERIN (NITROSTAT) 0.4 MG SL tablet Place 1 tablet (0.4 mg total) under the tongue every 5 (five) minutes as needed for chest pain. 25 tablet 4   pravastatin (PRAVACHOL) 40 MG tablet Take 1 tablet (40 mg total) by mouth daily. (Patient taking differently: Take 40 mg by mouth at bedtime.) 90 tablet 3   probenecid (BENEMID) 500 MG tablet TAKE 1/2 TABLET BY MOUTH TWICE DAILY (Patient taking differently: Take 250 mg by mouth 2 (two) times daily. TAKE 1/2 TABLET BY MOUTH TWICE DAILY) 90 tablet 3   spironolactone (ALDACTONE) 25 MG tablet TAKE 1/2 TABLET BY MOUTH ONCE DAILY (Patient taking differently: Take 12.5 mg by mouth every morning.) 30 tablet 1   valsartan (DIOVAN) 320 MG  tablet Take 1 tablet (320 mg total) by mouth daily. (Patient taking differently: Take 320 mg by mouth every morning.) 90 tablet 3   vitamin C (ASCORBIC ACID) 500 MG tablet Take 500 mg by mouth daily.     predniSONE (DELTASONE) 20 MG tablet Take 2 tablets (40 mg total) by mouth daily. 6 tablet 0   No facility-administered medications prior to visit.     Per HPI unless specifically indicated in ROS section below Review of Systems  Objective:  BP 138/84 (BP Location: Right Arm, Cuff Size: Large)   Pulse 69   Temp (!) 97.3 F (36.3 C) (Temporal)   Ht 5\' 7"  (1.702 m)   Wt 233 lb 4 oz (105.8 kg)   SpO2 95%   BMI 36.53 kg/m   Wt Readings from Last 3 Encounters:  11/19/22 232 lb 4.8 oz (105.4 kg)  11/18/22 231 lb 7.7 oz (105 kg)  11/16/22 233 lb 4 oz (105.8 kg)      Physical Exam Vitals and nursing note reviewed.  Constitutional:      Appearance: Normal appearance. He is obese. He is not ill-appearing.  HENT:  Head: Normocephalic and atraumatic.     Right Ear: Tympanic membrane, ear canal and external ear normal. There is no impacted cerumen.     Left Ear: Tympanic membrane, ear canal and external ear normal. There is no impacted cerumen.     Nose: Mucosal edema and congestion present. No rhinorrhea.     Right Nostril: No epistaxis.     Left Nostril: No epistaxis.     Right Turbinates: Swollen and pale.     Left Turbinates: Swollen and pale.     Mouth/Throat:     Mouth: Mucous membranes are moist.     Pharynx: Oropharynx is clear. No oropharyngeal exudate or posterior oropharyngeal erythema.  Eyes:     Extraocular Movements: Extraocular movements intact.     Pupils: Pupils are equal, round, and reactive to light.  Cardiovascular:     Rate and Rhythm: Normal rate and regular rhythm.     Pulses: Normal pulses.     Heart sounds: Normal heart sounds. No murmur heard. Pulmonary:     Effort: Pulmonary effort is normal. No respiratory distress.     Breath sounds: Normal breath  sounds. No wheezing, rhonchi or rales.  Musculoskeletal:     Right lower leg: No edema.     Left lower leg: No edema.  Lymphadenopathy:     Cervical: No cervical adenopathy.  Skin:    General: Skin is warm and dry.     Findings: No rash.  Neurological:     Mental Status: He is alert.  Psychiatric:        Mood and Affect: Mood normal.        Behavior: Behavior normal.       Results for orders placed or performed in visit on 11/16/22  CBC with Differential/Platelet  Result Value Ref Range   WBC 5.9 4.0 - 10.5 K/uL   RBC 4.29 4.22 - 5.81 Mil/uL   Hemoglobin 13.9 13.0 - 17.0 g/dL   HCT 74.2 59.5 - 63.8 %   MCV 91.3 78.0 - 100.0 fl   MCHC 35.4 30.0 - 36.0 g/dL   RDW 75.6 43.3 - 29.5 %   Platelets 177.0 150.0 - 400.0 K/uL   Neutrophils Relative % 56.7 43.0 - 77.0 %   Lymphocytes Relative 27.9 12.0 - 46.0 %   Monocytes Relative 9.1 3.0 - 12.0 %   Eosinophils Relative 5.2 (H) 0.0 - 5.0 %   Basophils Relative 1.1 0.0 - 3.0 %   Neutro Abs 3.3 1.4 - 7.7 K/uL   Lymphs Abs 1.6 0.7 - 4.0 K/uL   Monocytes Absolute 0.5 0.1 - 1.0 K/uL   Eosinophils Absolute 0.3 0.0 - 0.7 K/uL   Basophils Absolute 0.1 0.0 - 0.1 K/uL  Comprehensive metabolic panel  Result Value Ref Range   Sodium 137 135 - 145 mEq/L   Potassium 3.8 3.5 - 5.1 mEq/L   Chloride 102 96 - 112 mEq/L   CO2 25 19 - 32 mEq/L   Glucose, Bld 116 (H) 70 - 99 mg/dL   BUN 20 6 - 23 mg/dL   Creatinine, Ser 1.88 0.40 - 1.50 mg/dL   Total Bilirubin 0.6 0.2 - 1.2 mg/dL   Alkaline Phosphatase 60 39 - 117 U/L   AST 22 0 - 37 U/L   ALT 20 0 - 53 U/L   Total Protein 7.3 6.0 - 8.3 g/dL   Albumin 4.4 3.5 - 5.2 g/dL   GFR 41.66 >06.30 mL/min   Calcium 9.4 8.4 - 10.5 mg/dL  Protime-INR  Result Value Ref Range   INR 1.0 0.8 - 1.0 ratio   Prothrombin Time 10.8 9.6 - 13.1 sec  Uric acid  Result Value Ref Range   Uric Acid, Serum 5.8 4.0 - 7.8 mg/dL  IGE  Result Value Ref Range   IgE (Immunoglobulin E), Serum 30 <OR=114 kU/L    Lab  Results  Component Value Date   HGBA1C 5.6 10/24/2020   DG Chest 2 View CLINICAL DATA:  Preoperative evaluation.  EXAM: CHEST - 2 VIEW  COMPARISON:  May 13, 2022  FINDINGS: There is stable dual lead AICD positioning. The cardiac silhouette is mildly enlarged and unchanged in size. There is mild, stable prominence of the perihilar pulmonary vasculature without overt edema. There is no evidence of an acute infiltrate, pleural effusion or pneumothorax. The visualized skeletal structures are unremarkable.  IMPRESSION: Mild, stable cardiomegaly and mild, stable perihilar pulmonary vascular prominence without overt edema.  Electronically Signed   By: Aram Candela M.D.   On: 11/18/2022 03:14   Assessment & Plan:  Cardiac clearance through Dr Duke Salvia cardiologist - appt scheduled 11/19/2022.   Problem List Items Addressed This Visit     HTN (hypertension)    Chronic, stable with good BP control - continue current regimen  Appreciate cardiology clinic and pharmacy clinic care.       OSA (obstructive sleep apnea)    H/o, intolerant to CPAP      Opiate use     CSRS reviewed. Last hydrocodone filled 03/2022 - sparing use.       Gout    Stable period on daily colchicine and probenecid.  Has allopurinol allergy      Relevant Orders   Uric acid (Completed)   Severe obesity (BMI 35.0-39.9) with comorbidity   Perennial allergic rhinitis    Chronic, worsening. He has been managin with allegra and nasal saline irrigation.  Flonase worsened congestion.  Suggest trial different nasal steroid, change antihistamine. Trial singulair  nightly for a month and if helpful will continue. Advised monitoring for mood change/agitation/aggression with this medication.  Check IgE to see if would be candidate for allergy shots.       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 Cardiac clearance through cardiologist - upcoming appt later this week.  Check labs and CXR today.  Anticipate adequately low risk to proceed with planned surgical intervention.  Will forward results to neurosurgeon's office.       Relevant Orders   CBC with Differential/Platelet (Completed)   Comprehensive metabolic panel (Completed)   Protime-INR (Completed)   DG Chest 2 View (Completed)     Meds ordered this encounter  Medications   DISCONTD: montelukast (SINGULAIR) 10 MG tablet    Sig: Take 1 tablet (10 mg total) by mouth at bedtime.    Dispense:  30 tablet    Refill:  3    Orders Placed This Encounter  Procedures   DG Chest 2 View    Standing Status:   Future    Number of Occurrences:   1    Standing Expiration Date:   11/16/2023    Order Specific Question:   Reason for Exam (SYMPTOM  OR DIAGNOSIS REQUIRED)    Answer:   preop eval    Order Specific Question:   Preferred imaging  location?    Answer:   [facility]   CBC with Differential/Platelet   Comprehensive metabolic panel   Protime-INR   Uric acid   IGE    Patient Instructions  Consider changing allegra to xyzal or zyrtec.  Consider trial different nasal steroid like rhinocort, nasacort, nasonex.  Trial singulair 10mg  at night time, try for a month and if helpful may continue. If affecting mood, stop.  Labs and xray today - we will forward results to Dr Myer Haff.   Follow up plan: No follow-ups on file.  Eustaquio Boyden, MD

## 2022-11-16 NOTE — Patient Instructions (Addendum)
Consider changing allegra to xyzal or zyrtec.  Consider trial different nasal steroid like rhinocort, nasacort, nasonex.  Trial singulair  at night time, try for a month and if helpful may continue. If affecting mood, stop.  Labs and xray today - we will forward results to Dr Myer Haff.

## 2022-11-17 LAB — IGE: IgE (Immunoglobulin E), Serum: 30 kU/L (ref <=114)

## 2022-11-17 NOTE — Progress Notes (Signed)
[facility] Follow-up:    Date:  11/19/2022   ID:  Randy Kennedy, DOB [birth year], MRN [id number]  PCP:  Eustaquio Boyden, MD  Cardiologist:  None  Nephrologist:  Referring MD: Eustaquio Boyden, MD   CC: Hypertension  History of Present Illness:    Randy Kennedy is a 72 y.o. 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 He was initially seen in the [facility] 04/30/2022 by Gillian Shields, NP. Per her notes, he had been admitted 02/25/22-02/28/22 with symptomatic bradycardia. Underwent clonidine washout with persistent heart rate in the 40s and underwent PPM. Echo 02/26/22 EF 60-65%, gr2DD, RV moderately enlarged, normal PASP, LA moderately dilated, RA severely dilated, aortic sclerosis without stenisis. Wound check 03/12/22 well healed. Seen 04/02/22 for wound concerns with small scab at proximal edge with serosanguinous drainage treated with Keflex x 5 days. He had been seen 04/15/22 by Francis Dowse, PA. Site healed well, some sinus difficulties, home BP 160s/80s. He was referred to [facility]. Noted to be a patient of Dr. Mariah Milling. Prior echo 05/2017 with normal LVEF, gr2DD, mild MR, elevated PASP. Unable to tolerate CPAP, and previously declined Inspire. At his initial appointment with Luther Parody, he reported struggling with hypertension 20+ years. Home readings ranged 160-180/84-90, usually 2 hours after medications (took meds at 8 AM, 3 PM, 11 PM). Continued Valsartan  QD, Coreg  BID, Hydralazine  TID. They discussed increasing his Lasix from 3x per week to daily which he declined. They added minoxidil 2.5 mg daily. Hesitant to use Spironolactone, HCTZ, Chlorthalidone due to previous intolerance and gout. He was enrolled  in the research study. He followed up with our pharmacist 06/04/2022 where it was noted that his home study BP cuff reading was 25/10 points higher than in office readings. His prior two in office readings were both < 130/80. He was advised to continue monitoring his blood pressures with the study cuff and keep in mind readings were not truly representative. He was seen by Dr. Allena Katz 07/13/22 where it was noted that his dose of hydralazine was inappropriate. Hydralazine was decreased from 150 mg TID to 100 mg TID. He was started on 12.5 mg spironolactone daily. His home blood pressures were in the 150s/90s range, and spironolactone was later increased to 25 mg daily. Blood pressures were more controlled when he saw our pharmacist 07/2022.   Today, he also presents for preoperative clearance. He is scheduled for L4-5 transoraminal lumbar interbody fusion under general anesthesia on 11/27/2022 by Dr. Myer Haff. He may continue low dose ASA throughout the perioperative course. From a cardiovascular standpoint he denies chest pain or pressure. His breathing has been stable aside from suffering some seasonal allergies. For exercise he usually completes housework. [place] and walks up steps daily. He denies anginal symptoms. He reports well controlled blood pressures at home, similar to in office today (122/80). Yesterday 127/77 at home. He is mostly concerned about his weight as the main contributing factor. He states that he struggles to lose weight unless he limits his diet to 600 calories a day. He doesn't eat fish or many vegetables. Usually he eats meat and potatoes. He denies any palpitations, chest pain, shortness of breath, or peripheral edema. No lightheadedness, headaches, syncope, orthopnea, or PND.  Previous antihypertensives: Amlodipine 2022 - rash in  groin Lisinopril-HCTZ - burning in feet Nifedipine 2014- bradycardia, leg swelling Spironolactone 2018- malaise, dizziness Clonidine/Metoprolol-  bradycardia Imdur 2019- headache Chlorthalidone 2014-2018- gout Doxazosin - rash, itching   Past Medical History:  Diagnosis Date   Allergic rhinitis    Arthritis    Bradycardia    Chest pain    a. 11/2011 normal stress test performed in [place] - see chart.   Chronic fatigue    Chronic pain    L ankle and bilateral hips (remote fracture s/p surgeries), lower back (told has herniated disc)   Closed avulsion fracture of right talus 06/2016   COVID-19 06/2020   Dyspnea    Eczema    Erectile dysfunction 09/14/2012   Sullivan Lone disease    Gout 2007   Hearing loss    otosclerosis   Heartburn    mild, controlled with pepcid   Hepatitis    as a child 58 years old   History of chicken pox    History of hepatitis B    as child, no sequelae   HLD (hyperlipidemia)    HTN (hypertension)    difficult to control - clonidine and beta blockers caused bradycardia   Leg edema    Lobar pneumonia 05/13/2022   Morbid obesity    Motion sickness    Most moving vehicles   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 moderate, AHI 37.4 increased to 80 in REM, on BiPAP 14/10, 97% compliance >4 hrs (06/2013); b. Does not tolerate CPAP.   Pneumonia    RBBB    RLS (restless legs syndrome)    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   Reviewed prior workup (saw pain management at [facility], [place]): chronic back pain with RLS + RSD L leg with severe L post-traumatic ankle joint arthosis, no mention of peripheral neuropathy. Treated with vicodin, prior tried fentanyl and butrans.   Sinus node dysfunction    Status cardiac pacemaker    Systolic murmur    Thrombocytopenia    Wears dentures    Partial lower   Wears hearing aid in both ears    Does not wear all the time    Past Surgical History:  Procedure Laterality Date   CATARACT EXTRACTION W/PHACO Right 11/26/2020   Procedure: CATARACT EXTRACTION PHACO AND INTRAOCULAR LENS PLACEMENT  (IOC) RIGHT VIVITY TORIC LENS 7.17 00:49.6;  Surgeon: Galen Manila, MD;  Location: [facility];  Service: Ophthalmology;  Laterality: Right;  Latex Sleep Apnea   CATARACT EXTRACTION W/PHACO Left 12/10/2020   Procedure: CATARACT EXTRACTION PHACO AND INTRAOCULAR LENS PLACEMENT (IOC) LEFT VIVITY;  Surgeon: Galen Manila, MD;  Location: [facility];  Service: Ophthalmology;  Laterality: Left;  Latex Sleep apnea 5.37 00:32.7   COLONOSCOPY  02/2011   ext hem, benign polyp, rpt 5 yrs ([place])   COLONOSCOPY WITH PROPOFOL N/A 12/17/2020   multiple TA, ext hem, rpt 3 yrs ([place], Loel Dubonnet, MD)   LEG SURGERY  x5   left - after fall at work (3.5 stories)   MANDIBLE SURGERY  1965   jaw fracture - horse kick   MOHS SURGERY Right 07/03/2021   Ear   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   Procedure: PACEMAKER IMPLANT;  Surgeon: Regan Lemming, MD;  Location: [facility];  Service: Cardiovascular;  Laterality: N/A;   TYMPANIC MEMBRANE REPAIR Left 1994   otosclerosis    Current Medications: Current Meds  Medication Sig   aspirin EC 81 MG tablet Take 81 mg by mouth daily.   carvedilol (COREG) 25 MG tablet Take 1 tablet (25 mg total) by mouth 2 (two) times daily with a meal.   colchicine 0.6 MG tablet TAKE ONE TABLET BY MOUTH ONCE DAILY (Patient taking differently: Take 0.6 mg by mouth every morning.)   fexofenadine (ALLEGRA) 180 MG tablet Take 180 mg by mouth every morning.   fluticasone (FLONASE) 50 MCG/ACT nasal spray Place 2 sprays into both nostrils daily. (Patient taking differently: Place 2 sprays into both nostrils as needed for allergies.)   furosemide (LASIX) 20 MG tablet TAKE ONE TABLET BY MOUTH three DAYS A WEEK (Patient taking differently: Take 20 mg by mouth as needed for edema.)   gabapentin (NEURONTIN) 300 MG capsule TAKE ONE CAPSULE BY MOUTH TWICE DAILY and TAKE TWO CAPSULES EVERYDAY AT BEDTIME (Patient taking differently: Take 900 mg by mouth at bedtime.)    hydrALAZINE (APRESOLINE) 100 MG tablet TAKE 1 AND 1/2 TABLETS BY MOUTH THREE TIMES DAILY, MAY increase TO FOUR TIMES DAILY AS NEEDED FOR HIGH BLOOD PRESSURE (Patient taking differently: Take 100 mg by mouth 3 (three) times daily. TAKE 1 AND 1/2 TABLETS BY MOUTH THREE TIMES DAILY, MAY increase TO FOUR TIMES DAILY AS NEEDED FOR HIGH BLOOD PRESSURE)   HYDROcodone-acetaminophen (NORCO) 10-325 MG tablet Take 1 tablet by mouth 2 (two) times daily as needed for moderate pain (sparingly).   meloxicam (MOBIC) 15 MG tablet TAKE 1 TABLET BY MOUTH ONCE DAILY AS NEEDED FOR  GOUT  FLARE (Patient taking differently: Take 15 mg by mouth as needed.)   minoxidil (LONITEN) 2.5 MG tablet TAKE ONE TABLET BY MOUTH EVERYDAY AT BEDTIME (Patient taking differently: Take 2.5 mg by mouth at bedtime. TAKE ONE TABLET BY MOUTH EVERYDAY AT BEDTIME)   montelukast (SINGULAIR) 10 MG tablet Take 10 mg by mouth at bedtime.   nitroGLYCERIN (NITROSTAT) 0.4 MG SL tablet Place 1 tablet (0.4 mg total) under the tongue every 5 (five) minutes as needed for chest pain.   pravastatin (PRAVACHOL) 40 MG tablet Take 1 tablet (40 mg total) by mouth daily. (Patient taking differently: Take 40 mg by mouth at bedtime.)   probenecid (BENEMID) 500 MG tablet TAKE 1/2 TABLET BY MOUTH TWICE DAILY (Patient taking differently: Take 250 mg by mouth 2 (two) times daily. TAKE 1/2 TABLET BY MOUTH TWICE DAILY)   spironolactone (ALDACTONE) 25 MG tablet TAKE 1/2 TABLET BY MOUTH ONCE DAILY (Patient taking differently: Take 12.5 mg by mouth every morning.)   valsartan (DIOVAN) 320 MG tablet Take 1 tablet (320 mg total) by mouth daily. (Patient taking differently: Take 320 mg by mouth every morning.)   vitamin C (ASCORBIC ACID) 500 MG tablet Take 500 mg by mouth daily.     Allergies:   Amlodipine, Allopurinol, Lisinopril-hydrochlorothiazide, Procardia [nifedipine], Spironolactone, Clonidine derivatives, Doxazosin mesylate, Latex, Nortriptyline, and Uloric [febuxostat]    Social History   Socioeconomic History   Marital status: Married    Spouse name: Not on file   Number of children: Not on file   Years of education: Not on file   Highest education level: Master's degree (e.g., MA, MS, MEng, MEd, MSW, MBA)  Occupational History   Not on file  Tobacco Use   Smoking status: Former    Packs/day: 1.00    Years: 4.00    Additional pack years: 0.00    Total pack years: 4.00    Types: Pipe, Cigarettes    Quit date:  07/27/1978    Years since quitting: 44.3   Smokeless tobacco: Never  Vaping Use   Vaping Use: Never used  Substance and Sexual Activity   Alcohol use: No    Alcohol/week: 0.0 standard drinks of alcohol   Drug use: No   Sexual activity: Not on file  Other Topics Concern   Not on file  Social History Narrative   Caffeine: 2 cans coke/day   Lives with wife and grown son    Occupation: retired, was Administrator, arts.   On disability for chronic pain   Edu: MBA   Activity: no regular exercise   Deit: good water, fruits/vegetables daily   Social Determinants of Health   Financial Resource Strain: Low Risk  (11/13/2022)   Overall Financial Resource Strain (CARDIA)    Difficulty of Paying Living Expenses: Not hard at all  Food Insecurity: No Food Insecurity (11/13/2022)   Hunger Vital Sign    Worried About Running Out of Food in the Last Year: Never true    Ran Out of Food in the Last Year: Never true  Transportation Needs: No Transportation Needs (11/13/2022)   PRAPARE - Administrator, Civil Service (Medical): No    Lack of Transportation (Non-Medical): No  Physical Activity: Unknown (11/13/2022)   Exercise Vital Sign    Days of Exercise per Week: Patient declined    Minutes of Exercise per Session: 0 min  Recent Concern: Physical Activity - Inactive (10/05/2022)   Exercise Vital Sign    Days of Exercise per Week: 0 days    Minutes of Exercise per Session: 0 min  Stress: Patient Declined (11/13/2022)   [employer] of Occupational Health - Occupational Stress Questionnaire    Feeling of Stress : Patient declined  Social Connections: Unknown (11/13/2022)   Social Connection and Isolation Panel [NHANES]    Frequency of Communication with Friends and Family: More than three times a week    Frequency of Social Gatherings with Friends and Family: Twice a week    Attends Religious Services: Patient declined    Database administrator or Organizations: Yes    Attends Banker Meetings: Patient declined    Marital Status: Married  Recent Concern: Social Connections - Moderately Isolated (10/05/2022)   Social Connection and Isolation Panel [NHANES]    Frequency of Communication with Friends and Family: More than three times a week    Frequency of Social Gatherings with Friends and Family: Three times a week    Attends Religious Services: Never    Active Member of Clubs or Organizations: No    Attends Banker Meetings: Never    Marital Status: Married     Family History: The patient's family history includes Cancer in his mother and paternal uncle; Cancer (age of onset: 16) in his father; Hypertension in his mother. There is no history of Diabetes, Stroke, or CAD.  ROS:   Please see the history of present illness.    (+) Seasonal allergies All other systems reviewed and are negative.  EKGs/Labs/Other Studies Reviewed:    Bilateral Renal Artery Doppler  05/20/2022: Summary:  Renal:    Right: RRV flow present. Normal cortical thickness of right kidney.         Normal right Resisitive Index. Normal size right kidney. No         evidence of right renal artery stenosis.  Left:  LRV flow present. Normal size of left kidney. Normal left  Resistive Index. Normal cortical thickness of the left         kidney. No evidence of left renal artery stenosis.  Mesenteric:  Normal Superior Mesenteric artery and Celiac artery findings.   Pacemaker Implant   02/27/2022: CONCLUSIONS:   1. Successful implantation of a St Jude Medical Assurity MRI dual-chamber pacemaker for symptomatic bradycardia  2. No early apparent complications.   Echocardiogram  02/26/2022: 1. Left ventricular ejection fraction, by estimation, is 60 to 65%. The  left ventricle has normal function. The left ventricle has no regional  wall motion abnormalities. Left ventricular diastolic parameters are  consistent with Grade II diastolic  dysfunction (pseudonormalization).   2. Right ventricular systolic function is normal. The right ventricular  size is moderately enlarged. There is normal pulmonary artery systolic  pressure.   3. Left atrial size was moderately dilated.   4. Right atrial size was severely dilated.   5. The mitral valve is normal in structure. No evidence of mitral valve  regurgitation. No evidence of mitral stenosis.   6. The aortic valve is tricuspid. Aortic valve regurgitation is trivial.  Aortic valve sclerosis/calcification is present, without any evidence of  aortic stenosis.   7. The inferior vena cava is normal in size with greater than 50%  respiratory variability, suggesting right atrial pressure of 3 mmHg.    EKG:  EKG is personally reviewed. 11/19/2022: Atrial paced. Rate 60 bpm. RBBB.  Recent Labs: 05/01/2022: TSH 2.690 11/16/2022: ALT 20; BUN 20; Creatinine, Ser 1.20; Hemoglobin 13.9; Platelets 177.0; Potassium 3.8; Sodium 137   Recent Lipid Panel    Component Value Date/Time   CHOL 180 03/18/2022 1142   CHOL 130 02/09/2012 0000   TRIG 337.0 (H) 03/18/2022 1142   TRIG 142 02/09/2012 0000   HDL 62.00 03/18/2022 1142   CHOLHDL 3 03/18/2022 1142   VLDL 67.4 (H) 03/18/2022 1142   LDLCALC 63 12/29/2021 0952   LDLCALC 47 02/09/2012 0000   LDLDIRECT 83.0 03/18/2022 1142    Physical Exam:    VS:  BP 122/80 (BP Location: Left Arm, Patient Position: Sitting, Cuff Size: Large)   Pulse 60   Ht 5\' 7"  (1.702 m)   Wt 232 lb 4.8 oz (105.4 kg)    SpO2 98%   BMI 36.38 kg/m  , BMI Body mass index is 36.38 kg/m. GENERAL:  Well appearing HEENT: Pupils equal round and reactive, fundi not visualized, oral mucosa unremarkable NECK:  No jugular venous distention, waveform within normal limits, carotid upstroke brisk and symmetric, no bruits, no thyromegaly LUNGS:  Clear to auscultation bilaterally HEART:  RRR.  PMI not displaced or sustained,S1 and S2 within normal limits, no S3, no S4, no clicks, no rubs, no murmurs ABD:  Flat, positive bowel sounds normal in frequency in pitch, no bruits, no rebound, no guarding, no midline pulsatile mass, no hepatomegaly, no splenomegaly EXT:  2 plus pulses throughout, no edema, no cyanosis, no clubbing SKIN:  No rashes, no nodules NEURO:  Cranial nerves II through XII grossly intact, motor grossly intact throughout PSYCH:  Cognitively intact, oriented to person place and time   ASSESSMENT/PLAN:    HTN (hypertension) Blood pressure has been well-controlled both here and at home.  Will not make any changes to his current regimen of carvedilol, minoxidil, spironolactone, and valsartan.  Encouraged him to work on increasing his exercise after his surgery as tolerated.  OSA (obstructive sleep apnea) Has not been interested in treatment for sleep apnea.  Sinus bradycardia Status post pacemaker.  Follows with Dr. Elberta Fortis.  Low risk for surgery.  He does not have any unstable cardiac conditions.  OK to hold aspirin as needed.  Screening for Secondary Hypertension:     05/01/2022   11:07 AM 06/04/2022    7:10 AM  Causes  Drugs/Herbals Screened   Renovascular HTN Screened Screened     - Comments  no RAS  Sleep Apnea Screened Screened     - Comments Intolerant of CPAP unable to tolerate CPAP  Thyroid Disease Screened Screened     - Comments  TSH WNL  Pheochromocytoma Screened Screened     - Comments  labs WNL  Cushing's Syndrome Screened   Compliance Screened     Relevant Labs/Studies:     Latest Ref Rng & Units 11/16/2022    9:46 AM 08/03/2022   11:21 AM 05/01/2022   11:45 AM  Basic Labs  Sodium 135 - 145 mEq/L 137  139  141   Potassium 3.5 - 5.1 mEq/L 3.8  4.0  4.6   Creatinine 0.40 - 1.50 mg/dL 4.09  8.11  9.14        Latest Ref Rng & Units 05/01/2022   11:45 AM 08/26/2020    9:51 AM  Thyroid   TSH 0.450 - 4.500 uIU/mL 2.690  2.82           Latest Ref Rng & Units 05/01/2022   11:45 AM  Metanephrines/Catecholamines   Epinephrine 0 - 62 pg/mL 55   Norepinephrine 0 - 874 pg/mL 675   Dopamine 0 - 48 pg/mL <30   Metanephrines 0.0 - 88.0 pg/mL 89.0   Normetanephrines  0.0 - 285.2 pg/mL 109.5           05/20/2022   12:52 PM  Renovascular   Renal Artery [place] Completed Yes     Disposition:    FU with Chesnie Capell C. Duke Salvia, MD, [facility] in 1 year.  Medication Adjustments/Labs and Tests Ordered: Current medicines are reviewed at length with the patient today.  Concerns regarding medicines are outlined above.   No orders of the defined types were placed in this encounter.  No orders of the defined types were placed in this encounter.  I,Mathew Stumpf,acting as a Neurosurgeon for Chilton Si, MD.,have documented all relevant documentation on the behalf of Chilton Si, MD,as directed by  Chilton Si, MD while in the presence of Chilton Si, MD.  I, Indigo Barbian C. Duke Salvia, MD have reviewed all documentation for this visit.  The documentation of the exam, diagnosis, procedures, and orders on 11/19/2022 are all accurate and complete.   Signed, Chilton Si, MD  11/19/2022 9:35 AM    [facility]

## 2022-11-18 ENCOUNTER — Encounter: Payer: Self-pay | Admitting: Cardiology

## 2022-11-18 ENCOUNTER — Encounter
Admission: RE | Admit: 2022-11-18 | Discharge: 2022-11-18 | Disposition: A | Discharge status: Home / Self Care | Payer: PPO | Source: Ambulatory Visit | Attending: Neurosurgery | Admitting: Neurosurgery

## 2022-11-18 ENCOUNTER — Telehealth: Payer: Self-pay | Admitting: *Deleted

## 2022-11-18 VITALS — BP 122/71 | HR 60 | Resp 14 | Ht 67.0 in | Wt 231.5 lb

## 2022-11-18 DIAGNOSIS — Z01812 Encounter for preprocedural laboratory examination: Secondary | ICD-10-CM | POA: Insufficient documentation

## 2022-11-18 DIAGNOSIS — Z01818 Encounter for other preprocedural examination: Secondary | ICD-10-CM

## 2022-11-18 HISTORY — DX: Pneumonia, unspecified organism: J18.9

## 2022-11-18 HISTORY — DX: Chronic fatigue, unspecified: R53.82

## 2022-11-18 HISTORY — DX: Bradycardia, unspecified: R00.1

## 2022-11-18 HISTORY — DX: Presence of cardiac pacemaker: Z95.0

## 2022-11-18 HISTORY — DX: Obesity, unspecified: E66.9

## 2022-11-18 HISTORY — DX: Localized edema: R60.0

## 2022-11-18 HISTORY — DX: Thrombocytopenia, unspecified: D69.6

## 2022-11-18 HISTORY — DX: Inflammatory liver disease, unspecified: K75.9

## 2022-11-18 HISTORY — DX: Sick sinus syndrome: I49.5

## 2022-11-18 HISTORY — DX: Unspecified right bundle-branch block: I45.10

## 2022-11-18 LAB — URINALYSIS, ROUTINE W REFLEX MICROSCOPIC
Bacteria, UA: NONE SEEN
Bilirubin Urine: NEGATIVE
Glucose, UA: NEGATIVE mg/dL
Hgb urine dipstick: NEGATIVE
Ketones, ur: NEGATIVE mg/dL
Leukocytes,Ua: NEGATIVE
Nitrite: NEGATIVE
Protein, ur: 30 mg/dL — AB
Specific Gravity, Urine: 1.016 (ref 1.005–1.030)
Squamous Epithelial / HPF: NONE SEEN /HPF (ref 0–5)
pH: 5 (ref 5.0–8.0)

## 2022-11-18 LAB — TYPE AND SCREEN
ABO/RH(D): O POS
Antibody Screen: NEGATIVE

## 2022-11-18 LAB — SURGICAL PCR SCREEN
MRSA, PCR: NEGATIVE
Staphylococcus aureus: NEGATIVE

## 2022-11-18 NOTE — Progress Notes (Signed)
PERIOPERATIVE PRESCRIPTION FOR IMPLANTED CARDIAC DEVICE PROGRAMMING  Patient Information: Name:  Randy Kennedy  DOB:  04/30/1951  MRN:  [id number]    Planned Procedure: L4-5 MINIMALLY INVASIVE (MIS) TRANSFORAMINAL LUMBAR INTERBODY FUSION (TLIF)    Surgeon:  Dr. Venetia Night, MD  Requesting device clearance: Quentin Mulling, FNP-C  Date of Procedure:  11/27/2022  Cautery will be used.   Please route documentation back me via [facility], or may fax report to [facility] PAT APP at [phone].  Device Information:  Clinic EP Physician:  Loman Brooklyn, MD   Device Type:  Pacemaker Manufacturer and Phone #:  St. Jude/Abbott: [phone] Pacemaker Dependent?:  Yes.   Date of Last Device Check:  09/10/2022 Normal Device Function?:  Yes.    Electrophysiologist's Recommendations:  Have magnet available. Provide continuous ECG monitoring when magnet is used or reprogramming is to be performed.  Procedure may interfere with device function.  Magnet should be placed over device during procedure.  Per Device Clinic Standing Orders, Lenor Coffin, RN  1:55 PM 11/18/2022

## 2022-11-18 NOTE — Telephone Encounter (Signed)
1. What type of surgery is being performed?  L4-5 MINIMALLY INVASIVE (MIS) TRANSFORAMINAL LUMBAR INTERBODY FUSION (TLIF)   2. When is this surgery scheduled?  11/27/2022    3. Type of clearance being requested (medical, pharmacy, both)?  MEDICAL    4. Are there any medications that need to be held prior to surgery?  NONE - may continue lwo dose ASA throughout perioperative course.   5. Practice name and name of physician performing surgery?  Performing surgeon: Dr. Venetia Night, MD  Requesting clearance: Quentin Mulling, FNP-C     6. Anesthesia type (none, local, MAC, general)?  GENERAL   7. What is the office phone and fax number?   Phone: [phone]  Fax: [phone]

## 2022-11-18 NOTE — Patient Instructions (Addendum)
Your procedure is scheduled on:11-27-22 Friday Report to the Registration Desk on the 1st floor of the [facility].Then proceed to the 2nd floor Surgery Desk To find out your arrival time, please call [phone] between 1PM - 3PM on:11-26-22 Thursday If your arrival time is 6:00 am, do not arrive before that time as the [facility] entrance doors do not open until 6:00 am.  REMEMBER: Instructions that are not followed completely may result in serious medical risk, up to and including death; or upon the discretion of your surgeon and anesthesiologist your surgery may need to be rescheduled.  Do not eat food after midnight the night before surgery.  No gum chewing or hard candies.  You may however, drink CLEAR liquids up to 2 hours before you are scheduled to arrive for your surgery. Do not drink anything within 2 hours of your scheduled arrival time.  Clear liquids include: - water  - apple juice without pulp - gatorade (not RED colors) - black coffee or tea (Do NOT add milk or creamers to the coffee or tea) Do NOT drink anything that is not on this list.  One week prior to surgery:Last dose 11-19-22 Stop Anti-inflammatories (NSAIDS) such as meloxicam (MOBIC), Advil, Aleve, Ibuprofen, Motrin, Naproxen, Naprosyn and Aspirin based products such as Excedrin, Goody's Powder, BC Powder.You may however, take Tylenol/Hydrocodone if needed for pain up until the day of surgery. Stop ANY OVER THE COUNTER supplements/vitamins (11-19-22) until after surgery (Vitamin C)   TAKE ONLY THESE MEDICATIONS THE MORNING OF SURGERY WITH A SIP OF WATER: -carvedilol (COREG)  -colchicine  -fexofenadine (ALLEGRA)   -hydrALAZINE (APRESOLINE)  -probenecid (BENEMID)   Continue your 81 mg Aspirin up until the day prior to surgery as instructed by Dr Carroll Kinds NOT take the day of surgery  No Alcohol for 24 hours before or after surgery.  No Smoking including e-cigarettes for 24 hours before surgery.  No  chewable tobacco products for at least 6 hours before surgery.  No nicotine patches on the day of surgery.  Do not use any "recreational" drugs for at least a week (preferably 2 weeks) before your surgery.  Please be advised that the combination of cocaine and anesthesia may have negative outcomes, up to and including death. If you test positive for cocaine, your surgery will be cancelled.  On the morning of surgery brush your teeth with toothpaste and water, you may rinse your mouth with mouthwash if you wish. Do not swallow any toothpaste or mouthwash.  Use CHG Soap as directed on instruction sheet.  Do not wear jewelry, make-up, hairpins, clips or nail polish.  Do not wear lotions, powders, or perfumes.   Do not shave body hair from the neck down 48 hours before surgery.  Contact lenses, hearing aids and dentures may not be worn into surgery.  Do not bring valuables to the hospital. [facility] is not responsible for any missing/lost belongings or valuables.   Total Shoulder Arthroplasty:  use Benzoyl Peroxide 5% Gel as directed on instruction sheet.  Notify your doctor if there is any change in your medical condition (cold, fever, infection).  Wear comfortable clothing (specific to your surgery type) to the hospital.  After surgery, you can help prevent lung complications by doing breathing exercises.  Take deep breaths and cough every 1-2 hours. Your doctor may order a device called an Incentive Spirometer to help you take deep breaths. When coughing or sneezing, hold a pillow firmly against your incision with both hands. This  is called "splinting." Doing this helps protect your incision. It also decreases belly discomfort.  If you are being admitted to the hospital overnight, leave your suitcase in the car. After surgery it may be brought to your room.  In case of increased patient census, it may be necessary for you, the patient, to continue your postoperative care in the  Same Day Surgery department.  If you are being discharged the day of surgery, you will not be allowed to drive home. You will need a responsible individual to drive you home and stay with you for 24 hours after surgery.   If you are taking public transportation, you will need to have a responsible individual with you.  Please call the Pre-admissions Testing Dept. at [phone] if you have any questions about these instructions.  Surgery Visitation Policy:  Patients having surgery or a procedure may have two visitors.  Children under the age of 72 must have an adult with them who is not the patient.  Inpatient Visitation:    Visiting hours are 7 a.m. to 8 p.m. Up to four visitors are allowed at one time in a patient room. The visitors may rotate out with other people during the day.  One visitor age 72 or older may stay with the patient overnight and must be in the room by 8 p.m.

## 2022-11-18 NOTE — Telephone Encounter (Signed)
   Name: Randy Kennedy  DOB: [birth year]  MRN: [id number]  Primary Cardiologist: None  Chart reviewed as part of pre-operative protocol coverage. The patient has an upcoming visit scheduled with Dr. Duke Salvia on 11/19/2022 at which time clearance can be addressed in case there are any issues that would impact surgical recommendations.  Minimally invasive transforaminal lumbar interbody fusion L4-L5 is not scheduled until 11/27/2022 as below. I added preop FYI to appointment note so that provider is aware to address at time of outpatient visit.  Per office protocol the cardiology provider should forward their finalized clearance decision and recommendations regarding antiplatelet therapy to the requesting party below.    I will route this message as FYI to requesting party and remove this message from the preop box as separate preop APP input not needed at this time.   Please call with any questions.  Joylene Grapes, NP  11/18/2022, 2:17 PM

## 2022-11-18 NOTE — Telephone Encounter (Signed)
-----   Message from Verlee Monte, NP sent at 11/18/2022  1:47 PM EDT ----- Regarding: Request for pre-operative cardiac clearance Request for pre-operative cardiac clearance:  1. What type of surgery is being performed?  L4-5 MINIMALLY INVASIVE (MIS) TRANSFORAMINAL LUMBAR INTERBODY FUSION (TLIF)  2. When is this surgery scheduled?  11/27/2022  3. Type of clearance being requested (medical, pharmacy, both)? MEDICAL   4. Are there any medications that need to be held prior to surgery? NONE - may continue lwo dose ASA throughout perioperative course.   5. Practice name and name of physician performing surgery?  Performing surgeon: Dr. Venetia Night, MD Requesting clearance: Quentin Mulling, FNP-C    6. Anesthesia type (none, local, MAC, general)? GENERAL  7. What is the office phone and fax number?   Phone: [phone] Fax: [phone]  ATTENTION: Unable to create telephone message as per your standard workflow. Directed by [facility] providers to send requests for cardiac clearance to this pool for appropriate distribution to provider covering pre-operative clearances.   Quentin Mulling, MSN, APRN, FNP-C, CEN [facility]  Peri-operative Services Nurse Practitioner Phone: [phone] 11/18/22 1:47 PM

## 2022-11-19 ENCOUNTER — Encounter (HOSPITAL_BASED_OUTPATIENT_CLINIC_OR_DEPARTMENT_OTHER): Payer: Self-pay | Admitting: Cardiovascular Disease

## 2022-11-19 ENCOUNTER — Encounter (HOSPITAL_BASED_OUTPATIENT_CLINIC_OR_DEPARTMENT_OTHER): Payer: Self-pay | Admitting: *Deleted

## 2022-11-19 ENCOUNTER — Ambulatory Visit (HOSPITAL_BASED_OUTPATIENT_CLINIC_OR_DEPARTMENT_OTHER): Payer: PPO | Admitting: Cardiovascular Disease

## 2022-11-19 ENCOUNTER — Encounter: Payer: Self-pay | Admitting: Family Medicine

## 2022-11-19 VITALS — BP 122/80 | HR 60 | Ht 67.0 in | Wt 232.3 lb

## 2022-11-19 DIAGNOSIS — G4733 Obstructive sleep apnea (adult) (pediatric): Secondary | ICD-10-CM

## 2022-11-19 DIAGNOSIS — I10 Essential (primary) hypertension: Secondary | ICD-10-CM | POA: Diagnosis not present

## 2022-11-19 DIAGNOSIS — Z006 Encounter for examination for normal comparison and control in clinical research program: Secondary | ICD-10-CM

## 2022-11-19 DIAGNOSIS — Z01818 Encounter for other preprocedural examination: Secondary | ICD-10-CM | POA: Insufficient documentation

## 2022-11-19 DIAGNOSIS — R001 Bradycardia, unspecified: Secondary | ICD-10-CM

## 2022-11-19 NOTE — Assessment & Plan Note (Signed)
Blood pressure has been well-controlled both here and at home.  Will not make any changes to his current regimen of carvedilol, minoxidil, spironolactone, and valsartan.  Encouraged him to work on increasing his exercise after his surgery as tolerated.

## 2022-11-19 NOTE — Assessment & Plan Note (Signed)
Stable period on daily colchicine and probenecid.  Has allopurinol allergy

## 2022-11-19 NOTE — Assessment & Plan Note (Signed)
[place] CSRS reviewed. Last hydrocodone filled 03/2022 - sparing use.

## 2022-11-19 NOTE — Patient Instructions (Signed)
Medication Instructions:  Your physician recommends that you continue on your current medications as directed. Please refer to the Current Medication list given to you today.   Labwork: NONE  Testing/Procedures: NONE  Follow-Up: Your physician wants you to follow-up in: 1 YEAR WITH DR [facility]  You will receive a reminder letter in the mail two months in advance. If you don't receive a letter, please call our office to schedule the follow-up appointment.   YOU ARE LOW RISK FOR UPCOMING SURGERY   If you need a refill on your cardiac medications before your next appointment, please call your pharmacy.

## 2022-11-19 NOTE — Telephone Encounter (Signed)
Faxed clearance letter as requested.

## 2022-11-19 NOTE — Assessment & Plan Note (Signed)
Status post pacemaker.  Follows with Dr. Elberta Fortis.

## 2022-11-19 NOTE — Assessment & Plan Note (Signed)
H/o, intolerant to CPAP

## 2022-11-19 NOTE — Assessment & Plan Note (Signed)
Has not been interested in treatment for sleep apnea.

## 2022-11-19 NOTE — Assessment & Plan Note (Addendum)
RCRI = 0. Cardiac clearance through cardiologist - upcoming appt later this week.  Check labs and CXR today.  Anticipate adequately low risk to proceed with planned surgical intervention.  Will forward results to neurosurgeon's office.

## 2022-11-19 NOTE — Research (Signed)
I saw pt today after Dr. Leonides Sake follow up visit. Pt is in Dr. Leonides Sake Virtual Care HTN Study. Pt filled out research survey. Pt was enrolled in Group 1. Pt has successfully reached his blood pressure goal and completed the Virtual Care HTN Study.

## 2022-11-19 NOTE — Assessment & Plan Note (Signed)
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

## 2022-11-19 NOTE — Assessment & Plan Note (Addendum)
Chronic, stable with good BP control - continue current regimen  Appreciate cardiology clinic and pharmacy clinic care.

## 2022-11-19 NOTE — Assessment & Plan Note (Addendum)
Chronic, worsening. He has been managin with allegra and nasal saline irrigation.  Flonase worsened congestion.  Suggest trial different nasal steroid, change antihistamine. Trial singulair  nightly for a month and if helpful will continue. Advised monitoring for mood change/agitation/aggression with this medication.  Check IgE to see if would be candidate for allergy shots.

## 2022-11-19 NOTE — Addendum Note (Signed)
Addended by: , Pearlena Ow C on: 11/19/2022 12:40 PM   Modules accepted: Orders  

## 2022-11-20 ENCOUNTER — Telehealth: Payer: Self-pay | Admitting: Family Medicine

## 2022-11-20 NOTE — Telephone Encounter (Signed)
Placed ppw in Dr. G's box.  ?

## 2022-11-20 NOTE — Telephone Encounter (Signed)
Pt came in office to droop off copy of  Advance Directive for PCP to review it was place in PCP folder

## 2022-11-25 ENCOUNTER — Encounter: Payer: Self-pay | Admitting: Neurosurgery

## 2022-11-25 ENCOUNTER — Telehealth: Payer: Self-pay | Admitting: Neurosurgery

## 2022-11-25 NOTE — Telephone Encounter (Signed)
Discussed with Dr Myer Haff. Will write letter to excuse from jury duty for 12 weeks after surgery.   I notified Mr Haidar of this. He will print the letter from [employer]. We discussed that it is OK for him to sleep sitting up after surgery. He is also concerned about pain control after surgery. I informed him that this will be addressed while he is in the hospital and there are several factors/criteria that need to be met to determine that he is ready to go home. I encouraged him to communicate with our office once he is home from the hospital if he feels his pain is not adequately controlled.

## 2022-11-25 NOTE — Progress Notes (Signed)
Perioperative / Anesthesia Services  Pre-Admission Testing Clinical Review / Preoperative Anesthesia Consult  Date: 11/26/22  Patient Demographics:  Name: Randy Kennedy DOB:   [birth year] MRN:   [id number]  Planned Surgical Procedure(s):    Case: [id number] Date/Time: 11/27/22 1314   Procedures:      L4-5 MINIMALLY INVASIVE (MIS) TRANSFORAMINAL LUMBAR INTERBODY FUSION (TLIF)     APPLICATION OF INTRAOPERATIVE CT SCAN   Anesthesia type: General   Pre-op diagnosis:      Spondylolisthesis of lumbar region   M43.16      Chronic bilateral low back pain with bilateral sciatica  M54.42, M54.41, G89.29   Location: [facility] OR ROOM 03 / [facility]   Surgeons: Venetia Night, MD     NOTE: Available PAT nursing documentation and vital signs have been reviewed. Clinical nursing staff has updated patient's PMH/PSHx, current medication list, and drug allergies/intolerances to ensure comprehensive history available to assist in medical decision making as it pertains to the aforementioned surgical procedure and anticipated anesthetic course. Extensive review of available clinical information personally performed. [place] [facility] and PSHx updated with any diagnoses/procedures that  may have been inadvertently omitted during his intake with the pre-admission testing department's nursing staff.  Clinical Discussion:  Randy Kennedy is a 72 y.o. male who is submitted for pre-surgical anesthesia review and clearance prior to him undergoing the above procedure. Patient is a Former Smoker (4 pack years; quit 07/1978). Pertinent PMH includes: symptomatic bradycardia, sinoatrial node dysfunction (s/p PPM placement), diastolic dysfunction, cardiac murmur, angina, HTN, HLD, GERD (on H2 blocker as needed), OSAH (noncompliant with prescribed nocturnal PAP therapy), dyspnea, thrombocytopenia, Gilbert disease, HBV (as a child), chronic fatigue, chronic lower back pain with BILATERAL sciatica,  spondylolisthesis of lumbar region, OA, peripheral edema.  Patient is followed by cardiology Mariah Milling, MD). He was last seen in the cardiology clinic on 11/19/2022; notes reviewed. At the time of his clinic visit, patient doing well overall from a cardiovascular perspective. Patient denied any chest pain, shortness of breath, PND, orthopnea, palpitations, significant peripheral edema, weakness, fatigue, vertiginous symptoms, or presyncope/syncope. Patient with a past medical history significant for cardiovascular diagnoses. Documented physical exam was grossly benign, providing no evidence of acute exacerbation and/or decompensation of the patient's known cardiovascular conditions.  Of note, complete cardiovascular history unavailable for review at time of consult.  Patient has received care in [place].  Information gathered from patient report and from notes provided by his local cardiologist.  Patient underwent myocardial perfusion imaging study on 12/11/2011.  Study demonstrated normal left ventricular systolic function with an EF of 55%.  There was no evidence of stress-induced myocardial ischemia or arrhythmia; no scintigraphic evidence of scar.  Study determined to be normal and low risk.  Most recent TTE was performed on 02/26/2022 revealing a normal left ventricular systolic function with an EF of 60-65%.  There were no regional wall motion abnormalities. Left ventricular diastolic Doppler parameters consistent with pseudonormalization (G2DD).  Right ventricular size was moderately enlarged with normal function.  PASP normal at 32.2 mmHg.  There was moderate left and severe right atrial enlargement.  There was trivial aortic valve regurgitation. All transvalvular gradients were noted to be normal providing no evidence suggestive of valvular stenosis.  Patient with symptomatic bradycardia secondary to sinoatrial node dysfunction.  He is status post placement of an Abbott Assurity MRI dual-chamber  pacemaker on 02/27/2022.  Device is regularly interrogated by his primary cardiology team.  Last interrogation was on 09/10/2022, at  which time device was noted to be functioning properly.  Blood pressure well controlled at 122/80 mmHg on currently prescribed beta-blocker (carvedilol), diuretic (furosemide + spironolactone), vasodilator (hydralazine + minoxidil), and ARB (valsartan) therapies.  Patient is on pravastatin for his HLD diagnosis and ASCVD prevention.  Patient is not diabetic. He does have an OSAH diagnosis, however is reported to be noncompliant with prescribed nocturnal PAP therapy.  Patient makes efforts to maintain an active lifestyle.  Functional capacity somewhat limited by chronic back pain.  Patient able to complete all ADLs/IADLs without assistance.  PE works around his house and reports that he lives on the heel and walks up steps daily. Functional capacity, as defined by [facility]ASI, is documented as being >/= 4 METS.  No changes were made to his medication regimen.  Patient to follow-up with outpatient cardiology in 1 year or sooner if needed.  [name] is scheduled for an elective L4-5 MINIMALLY INVASIVE (MIS) TRANSFORAMINAL LUMBAR INTERBODY FUSION (TLIF) on 11/27/2022 with Dr. Volanda Napoleon, MD.  Given patient's past medical history significant for cardiovascular diagnoses, presurgical cardiac clearance was sought by the PAT team. Per cardiology, "based ACC/AHA guidelines, the patient's past medical history, and the amount of time since his last clinic visit, this patient would be at an overall ACCEPTABLE risk for the planned procedure without further cardiovascular testing or intervention at this time".     In review of his medication reconciliation, it is noted that patient is currently on prescribed daily antithrombotic therapy.  Given his cardiovascular history, Dr. Marcell Barlow has cleared patient to continue his daily low-dose ASA throughout his perioperative  course.  Patient denies previous perioperative complications with anesthesia in the past. In review of the available records, it is noted that patient underwent a general anesthetic course here at [facility] ([facility]) in 11/2020 without documented complications.      11/19/2022    8:59 AM 11/18/2022   11:37 AM 11/16/2022    9:43 AM  Vitals with BMI  Height 5\' 7"  5\' 7"    Weight 232 lbs 5 oz 231 lbs 8 oz   BMI 36.37 36.25   Systolic 122 122 161  Diastolic 80 71 84  Pulse 60 60     Providers/Specialists:   NOTE: Primary physician provider listed below. Patient may have been seen by APP or partner within same practice.   PROVIDER ROLE / SPECIALTY LAST Donalynn Furlong, MD Neurosurgery (Surgeon) 11/05/2022  Eustaquio Boyden, MD Primary Care Provider 11/16/2022  Julien Nordmann, MD Cardiology 11/19/2022  Steffanie Dunn, MD Electrophysiology 02/28/2022   Allergies:  Amlodipine, Allopurinol, Lisinopril-hydrochlorothiazide, Procardia [nifedipine], Spironolactone, Clonidine derivatives, Doxazosin mesylate, Latex, Nortriptyline, and Uloric [febuxostat]  Current Home Medications:   No current facility-administered medications for this encounter.    aspirin EC 81 MG tablet   carvedilol (COREG) 25 MG tablet   colchicine 0.6 MG tablet   fexofenadine (ALLEGRA) 180 MG tablet   fluticasone (FLONASE) 50 MCG/ACT nasal spray   furosemide (LASIX) 20 MG tablet   gabapentin (NEURONTIN) 300 MG capsule   hydrALAZINE (APRESOLINE) 100 MG tablet   HYDROcodone-acetaminophen (NORCO) 10-325 MG tablet   meloxicam (MOBIC) 15 MG tablet   minoxidil (LONITEN) 2.5 MG tablet   montelukast (SINGULAIR) 10 MG tablet   nitroGLYCERIN (NITROSTAT) 0.4 MG SL tablet   pravastatin (PRAVACHOL) 40 MG tablet   probenecid (BENEMID) 500 MG tablet   spironolactone (ALDACTONE) 25 MG tablet   valsartan (DIOVAN) 320 MG tablet   vitamin  C (ASCORBIC ACID) 500 MG tablet   History:    Past Medical History:  Diagnosis Date   Allergic rhinitis    Arthritis    Bradycardia    Chest pain    a. 11/2011 normal stress test performed in [place] - see chart.   Chronic fatigue    Chronic low back pain with bilateral sciatica    Chronic pain    L ankle and bilateral hips (remote fracture s/p surgeries), lower back (told has herniated disc)   Closed avulsion fracture of right talus 06/2016   COVID-19 06/2020   Diastolic dysfunction 02/26/2022   a.) TTE 02/26/2022: EF 60-65%, mod RVE, mod LAE, sev RAE, AoV sclerosis, G2DD   Dyspnea    Eczema    Erectile dysfunction 09/14/2012   Sullivan Lone disease    Gout 2007   Hearing loss    otosclerosis   Heartburn    mild, controlled with pepcid   History of chicken pox    History of hepatitis B    as child (21 years of age), no sequelae   HLD (hyperlipidemia)    HTN (hypertension)    difficult to control - clonidine and beta blockers caused bradycardia   Leg edema    Lobar pneumonia (HCC) 05/13/2022   Morbid obesity (HCC)    Motion sickness    Most moving vehicles   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 moderate, AHI 37.4 increased to 80 in REM, on BiPAP 14/10, 97% compliance >4 hrs (06/2013); b. Does not tolerate CPAP.   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 Treated with vicodin, prior tried fentanyl and butrans.   Sinus node dysfunction (HCC)    a.) s/p Abbott Assurity MRI dual-chamber pacemaker placement 02/27/2022   Spondylolisthesis of lumbar region    Systolic murmur    Thrombocytopenia (HCC)    Wears  dentures    Partial lower   Wears hearing aid in both ears    Does not wear all the time   Past Surgical History:  Procedure Laterality Date   CATARACT EXTRACTION W/PHACO Right 11/26/2020   Procedure: CATARACT EXTRACTION PHACO AND INTRAOCULAR LENS PLACEMENT (IOC) RIGHT VIVITY TORIC LENS 7.17 00:49.6;  Surgeon: Galen Manila, MD;  Location: [facility];  Service: Ophthalmology;  Laterality: Right;  Latex Sleep Apnea   CATARACT EXTRACTION W/PHACO Left 12/10/2020   Procedure: CATARACT EXTRACTION PHACO AND INTRAOCULAR LENS PLACEMENT (IOC) LEFT VIVITY;  Surgeon: Galen Manila, MD;  Location: [facility];  Service: Ophthalmology;  Laterality: Left;  Latex Sleep apnea 5.37 00:32.7   COLONOSCOPY  02/2011   ext hem, benign polyp, rpt 5 yrs ([place])   COLONOSCOPY WITH PROPOFOL N/A 12/17/2020   multiple TA, ext hem, rpt 3 yrs ([place], Loel Dubonnet, MD)   LEG SURGERY  x5   left - after fall at work (3.5 stories)   MANDIBLE SURGERY  1965   jaw fracture - horse kick   MOHS SURGERY Right 07/03/2021   Ear   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   Procedure:  PACEMAKER IMPLANT;  Surgeon: Regan Lemming, MD;  Location: [facility];  Service: Cardiovascular;  Laterality: N/A;   TYMPANIC MEMBRANE REPAIR Left 1994   otosclerosis   Family History  Problem Relation Age of Onset   Cancer Father 67       prostate   Cancer Mother        rectal   Hypertension Mother    Cancer Paternal Uncle        prostate   Diabetes Neg Hx    Stroke Neg Hx    CAD Neg Hx    Social History   Tobacco Use   Smoking status: Former    Packs/day: 1.00    Years: 4.00    Additional pack years: 0.00    Total pack years: 4.00    Types: Pipe, Cigarettes    Quit date: 07/27/1978    Years since quitting: 44.3   Smokeless tobacco: Never  Vaping Use   Vaping Use: Never used  Substance Use Topics   Alcohol use: No    Alcohol/week: 0.0 standard drinks of alcohol   Drug use: No    Pertinent  Clinical Results:  LABS:   Lab Results  Component Value Date   WBC 5.9 11/16/2022   HGB 13.9 11/16/2022   HCT 39.2 11/16/2022   MCV 91.3 11/16/2022   PLT 177.0 11/16/2022   Lab Results  Component Value Date   NA 137 11/16/2022   K 3.8 11/16/2022   CO2 25 11/16/2022   GLUCOSE 116 (H) 11/16/2022   BUN 20 11/16/2022   CREATININE 1.20 11/16/2022   CALCIUM 9.4 11/16/2022   EGFR 73 08/03/2022   GFRNONAA >60 02/26/2022    ECG: Date: 11/19/2022 Time ECG obtained: 0905 AM Rate: 60 bpm Rhythm: Atrial paced rhythm; RBBB Axis (leads I and aVF): Normal Intervals: PR 126 ms. QRS 156 ms. QTc 416 ms. ST segment and T wave changes: Inferolateral T wave changes  Comparison: Similar to previous tracing obtained on 06/03/2022   IMAGING / PROCEDURES: DIAGNOSTIC RADIOGRAPHS OF CHEST 2 VIEWS performed on 11/16/2022 There is stable dual lead AICD positioning.  The cardiac silhouette is mildly enlarged and unchanged in size.  There is mild, stable prominence of the perihilar pulmonary vasculature without overt edema.  There is no evidence of an acute infiltrate, pleural effusion or pneumothorax.  The visualized skeletal structures are unremarkable.  TRANSTHORACIC ECHOCARDIOGRAM performed on 02/26/2022 Left ventricular ejection fraction, by estimation, is 60 to 65%. The left ventricle has normal function. The left ventricle has no regional wall motion abnormalities. Left ventricular diastolic parameters are consistent with Grade II diastolic dysfunction (pseudonormalization).  Right ventricular systolic function is normal. The right ventricular size is moderately enlarged. There is normal pulmonary artery systolic pressure.  Left atrial size was moderately dilated.  Right atrial size was severely dilated.  The mitral valve is normal in structure. No evidence of mitral valve regurgitation. No evidence of mitral stenosis.  The aortic valve is tricuspid. Aortic valve regurgitation is trivial.  Aortic valve sclerosis/calcification is present, without any evidence of  aortic stenosis.  The inferior vena cava is normal in size with greater than 50% respiratory variability, suggesting right atrial pressure of 3 mmHg.   Impression and Plan:  SYLIS KETCHUM has been referred for pre-anesthesia review and clearance prior to him undergoing the planned anesthetic and procedural courses. Available labs, pertinent testing, and imaging results were personally reviewed by me in preparation for upcoming operative/procedural course. [facility] medical  record has been updated following extensive record review and patient interview with PAT staff.   This patient has been appropriately cleared by cardiology with an overall ACCEPTABLE risk of significant perioperative cardiovascular complications. Based on clinical review performed today (11/26/22), barring any significant acute changes in the patient's overall condition, it is anticipated that he will be able to proceed with the planned surgical intervention. Any acute changes in clinical condition may necessitate his procedure being postponed and/or cancelled. Patient will meet with anesthesia team (MD and/or CRNA) on the day of his procedure for preoperative evaluation/assessment. Questions regarding anesthetic course will be fielded at that time.   Pre-surgical instructions were reviewed with the patient during his PAT appointment, and questions were fielded to satisfaction by PAT clinical staff. He has been instructed on which medications that he will need to hold prior to surgery, as well as the ones that have been deemed safe/appropriate to take on the day of his procedure. As part of the general education provided by PAT, patient made aware both verbally and in writing, that he would need to abstain from the use of any illegal substances during his perioperative course.  He was advised that failure to follow the provided instructions could necessitate  case cancellation or result in serious perioperative complications up to and including death. Patient encouraged to contact PAT and/or his surgeon's office to discuss any questions or concerns that may arise prior to surgery; verbalized understanding.   Quentin Mulling, MSN, APRN, FNP-C, CEN [facility]  Peri-operative Services Nurse Practitioner Phone: [phone] Fax: [phone] 11/26/22 1:08 PM  NOTE: This note has been prepared using Dragon dictation software. Despite my best ability to proofread, there is always the potential that unintentional transcriptional errors may still occur from this process.

## 2022-11-25 NOTE — Telephone Encounter (Signed)
L4-5 TLIF on 11/27/22   He is asking to speak to the nurse. He was reading the information given to him here in the office. He has sleep apnea and can only lay or sleep kind of sitting up. He does not use a CPAP machine due to extreme allergies. Will that be a problem after surgery?   He received a letter for jury duty on December 27 2022. Can he get a letter that he can not attend due to his surgery. He will print it from his mychart.

## 2022-11-27 ENCOUNTER — Encounter
Admission: RE | Disposition: A | Discharge status: Home / Self Care | Payer: Self-pay | Source: Ambulatory Visit | Attending: Neurosurgery

## 2022-11-27 ENCOUNTER — Inpatient Hospital Stay
Admission: RE | Admit: 2022-11-27 | Discharge: 2022-11-29 | DRG: 455 | Disposition: A | Discharge status: Home / Self Care | Payer: PPO | Source: Ambulatory Visit | Attending: Neurosurgery | Admitting: Neurosurgery

## 2022-11-27 ENCOUNTER — Inpatient Hospital Stay: Payer: PPO | Admitting: Urgent Care

## 2022-11-27 ENCOUNTER — Other Ambulatory Visit: Payer: Self-pay

## 2022-11-27 ENCOUNTER — Inpatient Hospital Stay: Payer: PPO

## 2022-11-27 ENCOUNTER — Encounter: Payer: Self-pay | Admitting: Neurosurgery

## 2022-11-27 DIAGNOSIS — R5382 Chronic fatigue, unspecified: Secondary | ICD-10-CM | POA: Diagnosis not present

## 2022-11-27 DIAGNOSIS — Z4789 Encounter for other orthopedic aftercare: Secondary | ICD-10-CM | POA: Diagnosis not present

## 2022-11-27 DIAGNOSIS — Z01818 Encounter for other preprocedural examination: Secondary | ICD-10-CM

## 2022-11-27 DIAGNOSIS — K219 Gastro-esophageal reflux disease without esophagitis: Secondary | ICD-10-CM | POA: Diagnosis present

## 2022-11-27 DIAGNOSIS — I10 Essential (primary) hypertension: Secondary | ICD-10-CM | POA: Diagnosis not present

## 2022-11-27 DIAGNOSIS — M21372 Foot drop, left foot: Secondary | ICD-10-CM | POA: Diagnosis not present

## 2022-11-27 DIAGNOSIS — Z7951 Long term (current) use of inhaled steroids: Secondary | ICD-10-CM

## 2022-11-27 DIAGNOSIS — I495 Sick sinus syndrome: Secondary | ICD-10-CM | POA: Diagnosis present

## 2022-11-27 DIAGNOSIS — M5442 Lumbago with sciatica, left side: Secondary | ICD-10-CM | POA: Diagnosis not present

## 2022-11-27 DIAGNOSIS — Z7982 Long term (current) use of aspirin: Secondary | ICD-10-CM

## 2022-11-27 DIAGNOSIS — G47419 Narcolepsy without cataplexy: Secondary | ICD-10-CM | POA: Diagnosis present

## 2022-11-27 DIAGNOSIS — Z8616 Personal history of COVID-19: Secondary | ICD-10-CM

## 2022-11-27 DIAGNOSIS — M5441 Lumbago with sciatica, right side: Secondary | ICD-10-CM

## 2022-11-27 DIAGNOSIS — Z974 Presence of external hearing-aid: Secondary | ICD-10-CM

## 2022-11-27 DIAGNOSIS — E785 Hyperlipidemia, unspecified: Secondary | ICD-10-CM | POA: Diagnosis not present

## 2022-11-27 DIAGNOSIS — H9190 Unspecified hearing loss, unspecified ear: Secondary | ICD-10-CM | POA: Diagnosis present

## 2022-11-27 DIAGNOSIS — G8929 Other chronic pain: Secondary | ICD-10-CM | POA: Diagnosis not present

## 2022-11-27 DIAGNOSIS — Z950 Presence of cardiac pacemaker: Secondary | ICD-10-CM | POA: Diagnosis not present

## 2022-11-27 DIAGNOSIS — Z91199 Patient's noncompliance with other medical treatment and regimen due to unspecified reason: Secondary | ICD-10-CM | POA: Diagnosis not present

## 2022-11-27 DIAGNOSIS — Z961 Presence of intraocular lens: Secondary | ICD-10-CM | POA: Diagnosis present

## 2022-11-27 DIAGNOSIS — M1990 Unspecified osteoarthritis, unspecified site: Secondary | ICD-10-CM | POA: Diagnosis not present

## 2022-11-27 DIAGNOSIS — G4733 Obstructive sleep apnea (adult) (pediatric): Secondary | ICD-10-CM | POA: Diagnosis not present

## 2022-11-27 DIAGNOSIS — G2581 Restless legs syndrome: Secondary | ICD-10-CM | POA: Diagnosis not present

## 2022-11-27 DIAGNOSIS — J309 Allergic rhinitis, unspecified: Secondary | ICD-10-CM | POA: Diagnosis present

## 2022-11-27 DIAGNOSIS — I110 Hypertensive heart disease with heart failure: Secondary | ICD-10-CM | POA: Diagnosis not present

## 2022-11-27 DIAGNOSIS — M4316 Spondylolisthesis, lumbar region: Secondary | ICD-10-CM | POA: Diagnosis not present

## 2022-11-27 DIAGNOSIS — Z87891 Personal history of nicotine dependence: Secondary | ICD-10-CM | POA: Diagnosis not present

## 2022-11-27 DIAGNOSIS — M109 Gout, unspecified: Secondary | ICD-10-CM | POA: Diagnosis not present

## 2022-11-27 DIAGNOSIS — Z8249 Family history of ischemic heart disease and other diseases of the circulatory system: Secondary | ICD-10-CM | POA: Diagnosis not present

## 2022-11-27 DIAGNOSIS — Z981 Arthrodesis status: Secondary | ICD-10-CM

## 2022-11-27 DIAGNOSIS — Z79899 Other long term (current) drug therapy: Secondary | ICD-10-CM

## 2022-11-27 DIAGNOSIS — I509 Heart failure, unspecified: Secondary | ICD-10-CM | POA: Diagnosis not present

## 2022-11-27 HISTORY — PX: TRANSFORAMINAL LUMBAR INTERBODY FUSION W/ MIS 1 LEVEL: SHX6145

## 2022-11-27 HISTORY — PX: APPLICATION OF INTRAOPERATIVE CT SCAN: SHX6668

## 2022-11-27 HISTORY — DX: Other chronic pain: G89.29

## 2022-11-27 HISTORY — DX: Spondylolisthesis, lumbar region: M43.16

## 2022-11-27 LAB — ABO/RH: ABO/RH(D): O POS

## 2022-11-27 SURGERY — MINIMALLY INVASIVE (MIS) TRANSFORAMINAL LUMBAR INTERBODY FUSION (TLIF) 1 LEVEL
Anesthesia: General

## 2022-11-27 MED ORDER — KETOROLAC TROMETHAMINE 15 MG/ML IJ SOLN
7.5000 mg | Freq: Four times a day (QID) | INTRAMUSCULAR | Status: AC
  Administered 2022-11-27 – 2022-11-28 (×4): 7.5 mg via INTRAVENOUS
  Filled 2022-11-27 (×4): qty 1

## 2022-11-27 MED ORDER — PROBENECID 500 MG PO TABS
250.0000 mg | ORAL_TABLET | Freq: Two times a day (BID) | ORAL | Status: DC
  Administered 2022-11-27 – 2022-11-29 (×4): 250 mg via ORAL
  Filled 2022-11-27 (×4): qty 1

## 2022-11-27 MED ORDER — ONDANSETRON HCL 4 MG/2ML IJ SOLN
INTRAMUSCULAR | Status: DC | PRN
  Administered 2022-11-27: 4 mg via INTRAVENOUS

## 2022-11-27 MED ORDER — PRAVASTATIN SODIUM 20 MG PO TABS
40.0000 mg | ORAL_TABLET | Freq: Every day | ORAL | Status: DC
  Administered 2022-11-27 – 2022-11-28 (×2): 40 mg via ORAL
  Filled 2022-11-27 (×2): qty 2

## 2022-11-27 MED ORDER — CEFAZOLIN IN SODIUM CHLORIDE 2-0.9 GM/100ML-% IV SOLN
2.0000 g | Freq: Once | INTRAVENOUS | Status: DC
  Filled 2022-11-27: qty 100

## 2022-11-27 MED ORDER — PHENYLEPHRINE HCL (PRESSORS) 10 MG/ML IV SOLN
INTRAVENOUS | Status: AC
  Filled 2022-11-27: qty 1

## 2022-11-27 MED ORDER — GABAPENTIN 300 MG PO CAPS
900.0000 mg | ORAL_CAPSULE | Freq: Two times a day (BID) | ORAL | Status: DC
  Administered 2022-11-27 – 2022-11-29 (×4): 900 mg via ORAL
  Filled 2022-11-27 (×4): qty 3

## 2022-11-27 MED ORDER — VANCOMYCIN HCL IN DEXTROSE 1-5 GM/200ML-% IV SOLN
INTRAVENOUS | Status: AC
  Filled 2022-11-27: qty 200

## 2022-11-27 MED ORDER — PHENOL 1.4 % MT LIQD: 1.0000 | OROMUCOSAL | Status: DC | PRN

## 2022-11-27 MED ORDER — OXYCODONE HCL 5 MG PO TABS: 5.0000 mg | ORAL_TABLET | Freq: Once | ORAL | Status: DC | PRN

## 2022-11-27 MED ORDER — CHLORHEXIDINE GLUCONATE 0.12 % MT SOLN
15.0000 mL | Freq: Once | OROMUCOSAL | Status: AC
  Administered 2022-11-27: 15 mL via OROMUCOSAL

## 2022-11-27 MED ORDER — BUPIVACAINE HCL (PF) 0.5 % IJ SOLN
INTRAMUSCULAR | Status: AC
  Filled 2022-11-27: qty 30

## 2022-11-27 MED ORDER — MINOXIDIL 2.5 MG PO TABS
2.5000 mg | ORAL_TABLET | Freq: Every day | ORAL | Status: DC
  Administered 2022-11-28 – 2022-11-29 (×2): 2.5 mg via ORAL
  Filled 2022-11-27 (×2): qty 1

## 2022-11-27 MED ORDER — KETOROLAC TROMETHAMINE 30 MG/ML IJ SOLN
INTRAMUSCULAR | Status: DC | PRN
  Administered 2022-11-27: 30 mg via INTRAVENOUS

## 2022-11-27 MED ORDER — LORATADINE 10 MG PO TABS
10.0000 mg | ORAL_TABLET | Freq: Every day | ORAL | Status: DC
  Administered 2022-11-28 – 2022-11-29 (×2): 10 mg via ORAL
  Filled 2022-11-27 (×2): qty 1

## 2022-11-27 MED ORDER — OXYCODONE HCL 5 MG PO TABS
10.0000 mg | ORAL_TABLET | ORAL | Status: DC | PRN
  Administered 2022-11-27 – 2022-11-29 (×7): 10 mg via ORAL
  Filled 2022-11-27 (×8): qty 2

## 2022-11-27 MED ORDER — POLYETHYLENE GLYCOL 3350 17 G PO PACK
17.0000 g | PACK | Freq: Every day | ORAL | Status: DC | PRN
  Administered 2022-11-29: 17 g via ORAL
  Filled 2022-11-27: qty 1

## 2022-11-27 MED ORDER — METHOCARBAMOL 1000 MG/10ML IJ SOLN
500.0000 mg | Freq: Four times a day (QID) | INTRAVENOUS | Status: DC | PRN
  Administered 2022-11-27: 500 mg via INTRAVENOUS
  Filled 2022-11-27: qty 500

## 2022-11-27 MED ORDER — MIDAZOLAM HCL 2 MG/2ML IJ SOLN
INTRAMUSCULAR | Status: DC | PRN
  Administered 2022-11-27: 2 mg via INTRAVENOUS

## 2022-11-27 MED ORDER — VANCOMYCIN HCL 1000 MG IV SOLR
INTRAVENOUS | Status: AC
  Filled 2022-11-27: qty 20

## 2022-11-27 MED ORDER — COLCHICINE 0.6 MG PO TABS
0.6000 mg | ORAL_TABLET | ORAL | Status: DC
  Administered 2022-11-28 – 2022-11-29 (×2): 0.6 mg via ORAL
  Filled 2022-11-27 (×3): qty 1

## 2022-11-27 MED ORDER — METHOCARBAMOL 500 MG PO TABS
500.0000 mg | ORAL_TABLET | Freq: Four times a day (QID) | ORAL | Status: DC | PRN
  Administered 2022-11-28 – 2022-11-29 (×4): 500 mg via ORAL
  Filled 2022-11-27 (×4): qty 1

## 2022-11-27 MED ORDER — CEFAZOLIN SODIUM-DEXTROSE 2-4 GM/100ML-% IV SOLN
INTRAVENOUS | Status: AC
  Filled 2022-11-27: qty 100

## 2022-11-27 MED ORDER — MIDAZOLAM HCL 2 MG/2ML IJ SOLN
INTRAMUSCULAR | Status: AC
  Filled 2022-11-27: qty 2

## 2022-11-27 MED ORDER — HYDROMORPHONE HCL 1 MG/ML IJ SOLN
INTRAMUSCULAR | Status: AC
  Filled 2022-11-27: qty 1

## 2022-11-27 MED ORDER — ACETAMINOPHEN 500 MG PO TABS
1000.0000 mg | ORAL_TABLET | Freq: Four times a day (QID) | ORAL | Status: DC
  Administered 2022-11-27 – 2022-11-29 (×7): 1000 mg via ORAL
  Filled 2022-11-27 (×7): qty 2

## 2022-11-27 MED ORDER — HYDROMORPHONE HCL 1 MG/ML IJ SOLN
0.2500 mg | INTRAMUSCULAR | Status: DC | PRN
  Administered 2022-11-27: 0.25 mg via INTRAVENOUS
  Administered 2022-11-27 (×2): 0.5 mg via INTRAVENOUS
  Administered 2022-11-27: 0.25 mg via INTRAVENOUS
  Administered 2022-11-27: 0.5 mg via INTRAVENOUS

## 2022-11-27 MED ORDER — ONDANSETRON HCL 4 MG/2ML IJ SOLN: 4.0000 mg | Freq: Four times a day (QID) | INTRAMUSCULAR | Status: DC | PRN

## 2022-11-27 MED ORDER — DOCUSATE SODIUM 100 MG PO CAPS
100.0000 mg | ORAL_CAPSULE | Freq: Two times a day (BID) | ORAL | Status: DC
  Administered 2022-11-27 – 2022-11-29 (×4): 100 mg via ORAL
  Filled 2022-11-27 (×4): qty 1

## 2022-11-27 MED ORDER — SODIUM CHLORIDE FLUSH 0.9 % IV SOLN
INTRAVENOUS | Status: AC
  Filled 2022-11-27: qty 20

## 2022-11-27 MED ORDER — ACETAMINOPHEN 10 MG/ML IV SOLN
INTRAVENOUS | Status: DC | PRN
  Administered 2022-11-27: 1000 mg via INTRAVENOUS

## 2022-11-27 MED ORDER — ORAL CARE MOUTH RINSE: 15.0000 mL | Freq: Once | OROMUCOSAL | Status: AC

## 2022-11-27 MED ORDER — CHLORHEXIDINE GLUCONATE 0.12 % MT SOLN
OROMUCOSAL | Status: AC
  Filled 2022-11-27: qty 15

## 2022-11-27 MED ORDER — PHENYLEPHRINE HCL-NACL 20-0.9 MG/250ML-% IV SOLN
INTRAVENOUS | Status: DC | PRN
  Administered 2022-11-27: 30 ug/min via INTRAVENOUS

## 2022-11-27 MED ORDER — LACTATED RINGERS IV SOLN: INTRAVENOUS | Status: DC

## 2022-11-27 MED ORDER — ONDANSETRON HCL 4 MG PO TABS: 4.0000 mg | ORAL_TABLET | Freq: Four times a day (QID) | ORAL | Status: DC | PRN

## 2022-11-27 MED ORDER — 0.9 % SODIUM CHLORIDE (POUR BTL) OPTIME
TOPICAL | Status: DC | PRN
  Administered 2022-11-27: 500 mL

## 2022-11-27 MED ORDER — MONTELUKAST SODIUM 10 MG PO TABS
10.0000 mg | ORAL_TABLET | Freq: Every day | ORAL | Status: DC
  Administered 2022-11-27 – 2022-11-28 (×2): 10 mg via ORAL
  Filled 2022-11-27 (×2): qty 1

## 2022-11-27 MED ORDER — CEFAZOLIN SODIUM-DEXTROSE 2-3 GM-%(50ML) IV SOLR
INTRAVENOUS | Status: DC | PRN
  Administered 2022-11-27: 2 g via INTRAVENOUS

## 2022-11-27 MED ORDER — KETAMINE HCL 50 MG/5ML IJ SOSY
PREFILLED_SYRINGE | INTRAMUSCULAR | Status: AC
  Filled 2022-11-27: qty 5

## 2022-11-27 MED ORDER — EPINEPHRINE PF 1 MG/ML IJ SOLN
INTRAMUSCULAR | Status: AC
  Filled 2022-11-27: qty 1

## 2022-11-27 MED ORDER — FAMOTIDINE 20 MG PO TABS
ORAL_TABLET | ORAL | Status: AC
  Filled 2022-11-27: qty 1

## 2022-11-27 MED ORDER — FAMOTIDINE 20 MG PO TABS
20.0000 mg | ORAL_TABLET | Freq: Once | ORAL | Status: AC
  Administered 2022-11-27: 20 mg via ORAL

## 2022-11-27 MED ORDER — BISACODYL 10 MG RE SUPP: 10.0000 mg | Freq: Every day | RECTAL | Status: DC | PRN

## 2022-11-27 MED ORDER — SUCCINYLCHOLINE CHLORIDE 200 MG/10ML IV SOSY
PREFILLED_SYRINGE | INTRAVENOUS | Status: AC
  Filled 2022-11-27: qty 10

## 2022-11-27 MED ORDER — MENTHOL 3 MG MT LOZG: 1.0000 | LOZENGE | OROMUCOSAL | Status: DC | PRN

## 2022-11-27 MED ORDER — KETAMINE HCL 10 MG/ML IJ SOLN
INTRAMUSCULAR | Status: DC | PRN
  Administered 2022-11-27: 40 mg via INTRAVENOUS

## 2022-11-27 MED ORDER — SODIUM CHLORIDE 0.9 % IV SOLN: 250.0000 mL | INTRAVENOUS | Status: DC

## 2022-11-27 MED ORDER — VANCOMYCIN HCL IN DEXTROSE 1-5 GM/200ML-% IV SOLN
1000.0000 mg | Freq: Once | INTRAVENOUS | Status: AC
  Administered 2022-11-27: 1000 mg via INTRAVENOUS

## 2022-11-27 MED ORDER — ACETAMINOPHEN 10 MG/ML IV SOLN
INTRAVENOUS | Status: AC
  Filled 2022-11-27: qty 100

## 2022-11-27 MED ORDER — HYDROMORPHONE HCL 1 MG/ML IJ SOLN
0.5000 mg | INTRAMUSCULAR | Status: AC | PRN
  Administered 2022-11-27 (×2): 0.5 mg via INTRAVENOUS

## 2022-11-27 MED ORDER — SPIRONOLACTONE 12.5 MG HALF TABLET
12.5000 mg | ORAL_TABLET | ORAL | Status: DC
  Filled 2022-11-27 (×2): qty 1

## 2022-11-27 MED ORDER — SODIUM CHLORIDE 0.9% FLUSH
3.0000 mL | Freq: Two times a day (BID) | INTRAVENOUS | Status: DC
  Administered 2022-11-27 – 2022-11-29 (×4): 3 mL via INTRAVENOUS

## 2022-11-27 MED ORDER — OXYCODONE HCL 5 MG PO TABS: 5.0000 mg | ORAL_TABLET | ORAL | Status: DC | PRN

## 2022-11-27 MED ORDER — MORPHINE SULFATE (PF) 2 MG/ML IV SOLN: 2.0000 mg | INTRAVENOUS | Status: DC | PRN

## 2022-11-27 MED ORDER — LIDOCAINE HCL (CARDIAC) PF 100 MG/5ML IV SOSY
PREFILLED_SYRINGE | INTRAVENOUS | Status: DC | PRN
  Administered 2022-11-27: 80 mg via INTRAVENOUS

## 2022-11-27 MED ORDER — BUPIVACAINE LIPOSOME 1.3 % IJ SUSP
INTRAMUSCULAR | Status: AC
  Filled 2022-11-27: qty 20

## 2022-11-27 MED ORDER — SENNA 8.6 MG PO TABS
1.0000 | ORAL_TABLET | Freq: Two times a day (BID) | ORAL | Status: DC
  Administered 2022-11-27 – 2022-11-29 (×4): 8.6 mg via ORAL
  Filled 2022-11-27 (×4): qty 1

## 2022-11-27 MED ORDER — LIDOCAINE HCL (PF) 2 % IJ SOLN
INTRAMUSCULAR | Status: AC
  Filled 2022-11-27: qty 5

## 2022-11-27 MED ORDER — HYDRALAZINE HCL 50 MG PO TABS
100.0000 mg | ORAL_TABLET | Freq: Three times a day (TID) | ORAL | Status: DC
  Administered 2022-11-27 – 2022-11-29 (×5): 100 mg via ORAL
  Filled 2022-11-27 (×5): qty 2

## 2022-11-27 MED ORDER — OXYCODONE HCL 5 MG/5ML PO SOLN: 5.0000 mg | Freq: Once | ORAL | Status: DC | PRN

## 2022-11-27 MED ORDER — SODIUM CHLORIDE 0.9 % IV SOLN: INTRAVENOUS | Status: DC

## 2022-11-27 MED ORDER — DEXAMETHASONE SODIUM PHOSPHATE 10 MG/ML IJ SOLN
INTRAMUSCULAR | Status: DC | PRN
  Administered 2022-11-27: 10 mg via INTRAVENOUS

## 2022-11-27 MED ORDER — MAGNESIUM CITRATE PO SOLN: 1.0000 | Freq: Once | ORAL | Status: DC | PRN

## 2022-11-27 MED ORDER — DEXMEDETOMIDINE HCL IN NACL 80 MCG/20ML IV SOLN
INTRAVENOUS | Status: DC | PRN
  Administered 2022-11-27: 12 ug via INTRAVENOUS
  Administered 2022-11-27: 8 ug via INTRAVENOUS

## 2022-11-27 MED ORDER — DEXMEDETOMIDINE HCL IN NACL 80 MCG/20ML IV SOLN
INTRAVENOUS | Status: AC
  Filled 2022-11-27: qty 20

## 2022-11-27 MED ORDER — HYDROMORPHONE HCL 1 MG/ML IJ SOLN
INTRAMUSCULAR | Status: DC | PRN
  Administered 2022-11-27 (×2): .5 mg via INTRAVENOUS

## 2022-11-27 MED ORDER — SODIUM CHLORIDE 0.9% FLUSH: 3.0000 mL | INTRAVENOUS | Status: DC | PRN

## 2022-11-27 MED ORDER — CARVEDILOL 25 MG PO TABS
25.0000 mg | ORAL_TABLET | Freq: Two times a day (BID) | ORAL | Status: DC
  Administered 2022-11-28 – 2022-11-29 (×3): 25 mg via ORAL
  Filled 2022-11-27 (×3): qty 1

## 2022-11-27 MED ORDER — BUPIVACAINE-EPINEPHRINE (PF) 0.5% -1:200000 IJ SOLN
INTRAMUSCULAR | Status: DC | PRN
  Administered 2022-11-27: 10 mL via PERINEURAL

## 2022-11-27 MED ORDER — PROPOFOL 10 MG/ML IV BOLUS
INTRAVENOUS | Status: DC | PRN
  Administered 2022-11-27: 150 mg via INTRAVENOUS

## 2022-11-27 MED ORDER — SODIUM CHLORIDE (PF) 0.9 % IJ SOLN
INTRAMUSCULAR | Status: DC | PRN
  Administered 2022-11-27: 60 mL via INTRAMUSCULAR

## 2022-11-27 MED ORDER — SUCCINYLCHOLINE CHLORIDE 200 MG/10ML IV SOSY
PREFILLED_SYRINGE | INTRAVENOUS | Status: DC | PRN
  Administered 2022-11-27: 120 mg via INTRAVENOUS

## 2022-11-27 MED ORDER — ENOXAPARIN SODIUM 40 MG/0.4ML IJ SOSY
40.0000 mg | PREFILLED_SYRINGE | INTRAMUSCULAR | Status: DC
  Administered 2022-11-28 – 2022-11-29 (×2): 40 mg via SUBCUTANEOUS
  Filled 2022-11-27 (×2): qty 0.4

## 2022-11-27 MED ORDER — PROPOFOL 10 MG/ML IV BOLUS
INTRAVENOUS | Status: AC
  Filled 2022-11-27: qty 20

## 2022-11-27 MED ORDER — IRRISEPT - 450ML BOTTLE WITH 0.05% CHG IN STERILE WATER, USP 99.95% OPTIME
TOPICAL | Status: DC | PRN
  Administered 2022-11-27: 450 mL

## 2022-11-27 MED ORDER — IRBESARTAN 150 MG PO TABS
300.0000 mg | ORAL_TABLET | Freq: Every day | ORAL | Status: DC
  Administered 2022-11-27 – 2022-11-29 (×3): 300 mg via ORAL
  Filled 2022-11-27 (×3): qty 2

## 2022-11-27 MED ORDER — SURGIFLO WITH THROMBIN (HEMOSTATIC MATRIX KIT) OPTIME
TOPICAL | Status: DC | PRN
  Administered 2022-11-27: 1 via TOPICAL

## 2022-11-27 SURGICAL SUPPLY — 82 items
ADH SKN CLS APL DERMABOND .7 (GAUZE/BANDAGES/DRESSINGS) ×1
AGENT HMST KT MTR STRL THRMB (HEMOSTASIS) ×1
ALLOGRAFT BONE FIBER KORE 5 (Bone Implant) IMPLANT
APL PRP STRL LF DISP 70% ISPRP (MISCELLANEOUS) ×2
BLADE SURG 15 STRL LF DISP TIS (BLADE) IMPLANT
BLADE SURG 15 STRL SS (BLADE) ×1
BUR NEURO DRILL SOFT 3.0X3.8M (BURR) ×1 IMPLANT
CHLORAPREP W/TINT 26 (MISCELLANEOUS) ×1 IMPLANT
CNTNR URN SCR LID CUP LEK RST (MISCELLANEOUS) ×1 IMPLANT
CONT SPEC 4OZ STRL OR WHT (MISCELLANEOUS)
COVERAGE SUPP BRAINLAB NG SPNE (MISCELLANEOUS) ×1 IMPLANT
COVERAGE SUPPORT SPINE BRAINLB (MISCELLANEOUS) ×1
CUP MEDICINE 2OZ PLAST GRAD ST (MISCELLANEOUS) ×1 IMPLANT
DERMABOND ADVANCED .7 DNX12 (GAUZE/BANDAGES/DRESSINGS) ×1 IMPLANT
DRAPE C ARM PK CFD 31 SPINE (DRAPES) IMPLANT
DRAPE C-ARMOR (DRAPES) IMPLANT
DRAPE INCISE IOBAN 66X45 STRL (DRAPES) ×1 IMPLANT
DRAPE LAPAROTOMY 100X77 ABD (DRAPES) ×1 IMPLANT
DRAPE MICROSCOPE SPINE 48X150 (DRAPES) IMPLANT
DRAPE SCAN PATIENT (DRAPES) ×1 IMPLANT
DRSG OPSITE POSTOP 4X8 (GAUZE/BANDAGES/DRESSINGS) IMPLANT
DRSG TEGADERM 4X4.75 (GAUZE/BANDAGES/DRESSINGS) IMPLANT
ELECT CAUTERY BLADE TIP 2.5 (TIP) ×1
ELECT EZSTD 165MM 6.5IN (MISCELLANEOUS)
ELECT REM PT RETURN 9FT ADLT (ELECTROSURGICAL) ×1
ELECTRODE CAUTERY BLDE TIP 2.5 (TIP) ×1 IMPLANT
ELECTRODE EZSTD 165MM 6.5IN (MISCELLANEOUS) IMPLANT
ELECTRODE REM PT RTRN 9FT ADLT (ELECTROSURGICAL) ×1 IMPLANT
EX-PIN ORTHOLOCK NAV 4X150 (PIN) IMPLANT
FEE CVG SUPP BRAINLAB NG SPNE (MISCELLANEOUS) IMPLANT
FEE INTRAOP CADWELL SUPPLY NCS (MISCELLANEOUS) IMPLANT
FEE INTRAOP MONITOR IMPULS NCS (MISCELLANEOUS) IMPLANT
GAUZE 4X4 16PLY ~~LOC~~+RFID DBL (SPONGE) ×1 IMPLANT
GLOVE BIOGEL PI IND STRL 6.5 (GLOVE) ×2 IMPLANT
GLOVE SURG SYN 6.5 ES PF (GLOVE) ×2 IMPLANT
GLOVE SURG SYN 6.5 PF PI (GLOVE) ×2 IMPLANT
GLOVE SURG SYN 8.0 (GLOVE) ×3 IMPLANT
GLOVE SURG SYN 8.0 PF PI (GLOVE) IMPLANT
GLOVE SURG SYN 8.5  E (GLOVE) ×6
GLOVE SURG SYN 8.5 E (GLOVE) ×6 IMPLANT
GLOVE SURG SYN 8.5 PF PI (GLOVE) ×4 IMPLANT
GOWN SRG LRG LVL 4 IMPRV REINF (GOWNS) ×3 IMPLANT
GOWN SRG XL LVL 3 NONREINFORCE (GOWNS) ×1 IMPLANT
GOWN STRL NON-REIN TWL XL LVL3 (GOWNS) ×1
GOWN STRL REIN LRG LVL4 (GOWNS) ×3
GRAFT DURAGEN MATRIX 1WX1L (Tissue) IMPLANT
GUIDEWIRE NITINOL BEVEL TIP (WIRE) IMPLANT
HEMOVAC 400CC 10FR (MISCELLANEOUS) IMPLANT
INTERBODY SABLE 10X26 7-14 15D (Miscellaneous) IMPLANT
INTRAOP CADWELL SUPPLY FEE NCS (MISCELLANEOUS)
INTRAOP DISP SUPPLY FEE NCS (MISCELLANEOUS)
INTRAOP MONITOR FEE IMPULS NCS (MISCELLANEOUS)
INTRAOP MONITOR FEE IMPULSE (MISCELLANEOUS)
JET LAVAGE IRRISEPT WOUND (IRRIGATION / IRRIGATOR) ×1
K-WIRE PROS 0.6X70 (WIRE) ×1
KIT PREVENA INCISION MGT 13 (CANNISTER) IMPLANT
KIT SPINAL PRONEVIEW (KITS) ×1 IMPLANT
KNIFE BAYONET SHORT DISCETOMY (MISCELLANEOUS) IMPLANT
KWIRE PROS 0.6X70 (WIRE) IMPLANT
LAVAGE JET IRRISEPT WOUND (IRRIGATION / IRRIGATOR) IMPLANT
MANIFOLD NEPTUNE II (INSTRUMENTS) ×1 IMPLANT
MARKER SKIN DUAL TIP RULER LAB (MISCELLANEOUS) ×1 IMPLANT
MARKER SPHERE PSV REFLC 13MM (MARKER) ×7 IMPLANT
NDL SAFETY ECLIP 18X1.5 (MISCELLANEOUS) ×1 IMPLANT
NS IRRIG 1000ML POUR BTL (IV SOLUTION) ×1 IMPLANT
PACK LAMINECTOMY NEURO (CUSTOM PROCEDURE TRAY) ×1 IMPLANT
ROD RELINE MAS TI LORD 5.5X40 (Rod) IMPLANT
SCREW LOCK RELINE 5.5 TULIP (Screw) IMPLANT
SCREW RELINE RED 6.5X50MM POLY (Screw) IMPLANT
SOLUTION IRRIG SURGIPHOR (IV SOLUTION) ×1 IMPLANT
STAPLER SKIN PROX 35W (STAPLE) IMPLANT
SURGIFLO W/THROMBIN 8M KIT (HEMOSTASIS) ×1 IMPLANT
SUT DVC VLOC 3-0 CL 6 P-12 (SUTURE) ×1 IMPLANT
SUT VIC AB 0 CT1 27 (SUTURE) ×1
SUT VIC AB 0 CT1 27XCR 8 STRN (SUTURE) ×1 IMPLANT
SUT VIC AB 2-0 CT1 18 (SUTURE) ×1 IMPLANT
SYR 10ML LL (SYRINGE) ×1 IMPLANT
SYR 30ML LL (SYRINGE) ×1 IMPLANT
TOWEL OR 17X26 4PK STRL BLUE (TOWEL DISPOSABLE) ×1 IMPLANT
TRAP FLUID SMOKE EVACUATOR (MISCELLANEOUS) ×1 IMPLANT
TRAY FOLEY SLVR 16FR LF STAT (SET/KITS/TRAYS/PACK) IMPLANT
WATER STERILE IRR 1000ML POUR (IV SOLUTION) ×1 IMPLANT

## 2022-11-27 NOTE — Anesthesia Procedure Notes (Signed)
Procedure Name: Intubation Date/Time: 11/27/2022 1:38 PM  Performed by: Katherine Basset, CRNAPre-anesthesia Checklist: Patient identified, Emergency Drugs available, Suction available and Patient being monitored Patient Re-evaluated:Patient Re-evaluated prior to induction Oxygen Delivery Method: Circle system utilized Preoxygenation: Pre-oxygenation with 100% oxygen Induction Type: IV induction Laryngoscope Size: Miller and 2 Grade View: Grade II Tube type: Oral Tube size: 7.5 mm Number of attempts: 1 Airway Equipment and Method: Stylet, Oral airway, LTA kit utilized and Bite block Placement Confirmation: ETT inserted through vocal cords under direct vision, positive ETCO2 and breath sounds checked- equal and bilateral Secured at: 22 cm Tube secured with: Tape Dental Injury: Teeth and Oropharynx as per pre-operative assessment

## 2022-11-27 NOTE — Op Note (Signed)
Indications: [name] is a 72 yo male who presented with Spondylolisthesis of lumbar region M43.16 , Chronic bilateral low back pain with bilateral sciatica M54.42, M54.41, G89.29.   Findings: correction of anterolisthesis  Preoperative Diagnosis: Spondylolisthesis of lumbar region M43.16 , Chronic bilateral low back pain with bilateral sciatica M54.42, M54.41, G89.29  Postoperative Diagnosis: same   EBL: 25 ml IVF: see AR ml Drains: 1 placed Disposition: Extubated and Stable to PACU Complications: none  No foley catheter was placed.   Preoperative Note:   Risks of surgery discussed include: infection, bleeding, stroke, coma, death, paralysis, CSF leak, nerve/spinal cord injury, numbness, tingling, weakness, complex regional pain syndrome, recurrent stenosis and/or disc herniation, vascular injury, development of instability, neck/back pain, need for further surgery, persistent symptoms, development of deformity, and the risks of anesthesia. The patient understood these risks and agreed to proceed.  Operative Note:  1. Transforaminal Lumbar Interbody Fusion L4/5 2. Posterolateral arthrodesis L4 to L5 3. Posterior nonsegmental instrumentation L4 to L5 using Nuvasive Reline 4. Use of stereotaxis ([employer]rainlab) 5. Harvesting of autograft via the same incision 6. Placement of a biomechanical device (Globus Sable) at L4/5 for anterior arthrodesis  The patient was brought to the Operating Room, intubated and turned into the prone position. All pressure points were checked and double checked. The patient was prepped and draped in the standard fashion. A full timeout was performed. Preoperative antibiotics were given.   After draping, the stereotactic array was placed.  Stereotactic imaging was acquired and registered to the [place] system.  The image guidance system was used to choose bilateral [place] incisions. The incisions were injected with local anesthetic.  Each incision was opened  with a scalpel, bovie electrocautery was used to open the fascia.  Using stereotactic guidance, each pedicle from L4-L5 was cannulated and K wire secured.  We then placed pedicle screws over each K wire on the right. 6.5x75mm Nuvasive Reline screws were used bilaterally at each level.  After placement of right sided pedicle screws, we then turned our attention to transforaminal lumbar interbody fusion.  A tubular retractor was advanced over the left L4/5 facet. The microscope was brought into the field.  The left L4/5 facet was removed with osteotomes and the drill, and handed off for preparation as autograft. The traversing and exiting nerve roots on the left were identified and protected. The disc was opened using a scalpel. After incising the disc space, we took a combination of pituitary rongeurs, Kerrison rongeurs, disc scrapers, and curettes to remove a majority of the disc material.  We prepared the end plates for accepting the interbody fusion.  We removed the cartilaginous plate, preserved the cortical endplate if possible during this procedure.  The disc space was irrigated.  The Globus Sable biomechanical device was inserted, then backfilled with a mixture of allograft and autograft. During placement, the nerve roots and dural sac were carefully protected without any leaks identified. After placement of the device, the canal was fully inspected and all nerve roots were checked to ensure full decompression.  The retractor was removed.  The right sided pedicle screws were placed. The same size screws were used on the right.  Rods measured and placed. The rods were secured using locking caps to manufacturer's specifications. CT scan was taken to confirm instrumentation placement. The wound was copiously irrigated, then the posterior elements were prepared for arthrodesis.  A mixture of allograft and autograft was placed over the decorticated surfaces for arthrodesis.   A subfascial drain was  placed  on the left in the TLIF tract.  After hemostasis, the wound was closed in layers with 0 and 2-0 vicryl. 3-0 monocryl and dermabond was applied to the incision. A sterile dressing was placed.  The patient was then flipped supine and extubated with incident. All counts were correct times 2 at the end of the case. No immediate complications were noted.  Manning Charity PA assisted in the entire procedure. An assistant was required for this procedure due to the complexity.  The assistant provided assistance in tissue manipulation and suction, and was required for the successful and safe performance of the procedure. I performed the critical portions of the procedure.   Venetia Night MD

## 2022-11-27 NOTE — Transfer of Care (Signed)
Immediate Anesthesia Transfer of Care Note  Patient: Randy Kennedy  Procedure(s) Performed: L4-5 MINIMALLY INVASIVE (MIS) TRANSFORAMINAL LUMBAR INTERBODY FUSION (TLIF) APPLICATION OF INTRAOPERATIVE CT SCAN  Patient Location: PACU  Anesthesia Type:General  Level of Consciousness: drowsy  Airway & Oxygen Therapy: Patient Spontanous Breathing and Patient connected to face mask oxygen  Post-op Assessment: Report given to RN, Post -op Vital signs reviewed and stable, and Patient moving all extremities  Post vital signs: Reviewed and stable  Last Vitals:  Vitals Value Taken Time  BP 101/89 11/27/22 1710  Temp 36 C 11/27/22 1707  Pulse 60 11/27/22 1712  Resp 16 11/27/22 1712  SpO2 100 % 11/27/22 1712  Vitals shown include unvalidated device data.  Last Pain:  Vitals:   11/27/22 1202  TempSrc: Temporal  PainSc: 5       Patients Stated Pain Goal: 0 (11/27/22 1202)  Complications: No notable events documented.
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
## 2022-11-27 NOTE — Discharge Instructions (Signed)
Your surgeon has performed an operation on your lumbar spine (low back) to relieve pressure on one or more nerves. Many times, patients feel better immediately after surgery and can "overdo it." Even if you feel well, it is important that you follow these activity guidelines. If you do not let your back heal properly from the surgery, you can increase the chance of hardware complications and/or return of your symptoms. The following are instructions to help in your recovery once you have been discharged from the hospital.  Do not use NSAIDs for 3 months after surgery.   Activity    No bending, lifting, or twisting ("BLT"). Avoid lifting objects heavier than 10 pounds (gallon milk jug).  Where possible, avoid household activities that involve lifting, bending, pushing, or pulling such as laundry, vacuuming, grocery shopping, and childcare. Try to arrange for help from friends and family for these activities while your back heals.  Increase physical activity slowly as tolerated.  Taking short walks is encouraged, but avoid strenuous exercise. Do not jog, run, bicycle, lift weights, or participate in any other exercises unless specifically allowed by your doctor. Avoid prolonged sitting, including car rides.  Talk to your doctor before resuming sexual activity.  You should not drive until cleared by your doctor.  Until released by your doctor, you should not return to work or school.  You should rest at home and let your body heal.   You may shower three days after your surgery.  After showering, lightly dab your incision dry. Do not take a tub bath or go swimming for 3 weeks, or until approved by your doctor at your follow-up appointment.  If you smoke, we strongly recommend that you quit.  Smoking has been proven to interfere with normal healing in your back and will dramatically reduce the success rate of your surgery. Please contact [facility] (800-QUIT-NOW) and use the resources at  www.[facility].com for assistance in stopping smoking.  Surgical Incision   If you have a dressing on your incision, you may remove it three days after your surgery. Keep your incision area clean and dry.  Your incision was closed with Dermabond glue. The glue should begin to peel away within about a week.  Diet            You may return to your usual diet. Be sure to stay hydrated.  When to Contact us  Although your surgery and recovery will likely be uneventful, you may have some residual numbness, aches, and pains in your back and/or legs. This is normal and should improve in the next few weeks.  However, should you experience any of the following, contact us immediately: New numbness or weakness Pain that is progressively getting worse, and is not relieved by your pain medications or rest Bleeding, redness, swelling, pain, or drainage from surgical incision Chills or flu-like symptoms Fever greater than 101.0 F (38.3 C) Problems with bowel or bladder functions Difficulty breathing or shortness of breath Warmth, tenderness, or swelling in your calf  Contact Information During office hours (Monday-Friday 9 am to 5 pm), please call your physician at [phone] and ask for Sharlot Gowda After hours and weekends, please call [phone] and speak with the neurosurgeon on call For a life-threatening emergency, call 911 Your surgeon has performed an operation on your lumbar spine (low back) to relieve pressure on one or more nerves. Many times, patients feel better immediately after surgery and can "overdo it." Even if you feel well, it is important that  you follow these activity guidelines. If you do not let your back heal properly from the surgery, you can increase the chance of hardware complications and/or return of your symptoms. The following are instructions to help in your recovery once you have been discharged from the hospital.  Do not use NSAIDs for 3 months after surgery.    Activity    No bending, lifting, or twisting ("BLT"). Avoid lifting objects heavier than 10 pounds (gallon milk jug).  Where possible, avoid household activities that involve lifting, bending, pushing, or pulling such as laundry, vacuuming, grocery shopping, and childcare. Try to arrange for help from friends and family for these activities while your back heals.  Increase physical activity slowly as tolerated.  Taking short walks is encouraged, but avoid strenuous exercise. Do not jog, run, bicycle, lift weights, or participate in any other exercises unless specifically allowed by your doctor. Avoid prolonged sitting, including car rides.  Talk to your doctor before resuming sexual activity.  You should not drive until cleared by your doctor.  Until released by your doctor, you should not return to work or school.  You should rest at home and let your body heal.   You may shower three days after your surgery.  After showering, lightly dab your incision dry. Do not take a tub bath or go swimming for 3 weeks, or until approved by your doctor at your follow-up appointment.  If you smoke, we strongly recommend that you quit.  Smoking has been proven to interfere with normal healing in your back and will dramatically reduce the success rate of your surgery. Please contact [facility] (800-QUIT-NOW) and use the resources at www.[facility].com for assistance in stopping smoking.  Surgical Incision   If you have a dressing on your incision, you may remove it three days after your surgery. Keep your incision area clean and dry.  Your incision was closed with Dermabond glue. The glue should begin to peel away within about a week.  Diet            You may return to your usual diet. Be sure to stay hydrated.  When to Contact us  Although your surgery and recovery will likely be uneventful, you may have some residual numbness, aches, and pains in your back and/or legs. This is normal and should  improve in the next few weeks.  However, should you experience any of the following, contact us immediately: New numbness or weakness Pain that is progressively getting worse, and is not relieved by your pain medications or rest Bleeding, redness, swelling, pain, or drainage from surgical incision Chills or flu-like symptoms Fever greater than 101.0 F (38.3 C) Problems with bowel or bladder functions Difficulty breathing or shortness of breath Warmth, tenderness, or swelling in your calf  Contact Information During office hours (Monday-Friday 9 am to 5 pm), please call your physician at [phone] and ask for Sharlot Gowda After hours and weekends, please call [phone] and speak with the neurosurgeon on call For a life-threatening emergency, call 911

## 2022-11-27 NOTE — Interval H&P Note (Signed)
History and Physical Interval Note:  11/27/2022 12:39 PM  Randy Kennedy  has presented today for surgery, with the diagnosis of Spondylolisthesis of lumbar region   M43.16  Chronic bilateral low back pain with bilateral sciatica  M54.42, M54.41, G89.29.  The various methods of treatment have been discussed with the patient and family. After consideration of risks, benefits and other options for treatment, the patient has consented to  Procedure(s): L4-5 MINIMALLY INVASIVE (MIS) TRANSFORAMINAL LUMBAR INTERBODY FUSION (TLIF) (N/A) APPLICATION OF INTRAOPERATIVE CT SCAN (N/A) as a surgical intervention.  The patient's history has been reviewed, patient examined, no change in status, stable for surgery.  I have reviewed the patient's chart and labs.  Questions were answered to the patient's satisfaction.    Heart sounds normal no MRG. Chest Clear to Auscultation Bilaterally.   Buddie Marston

## 2022-11-27 NOTE — Anesthesia Postprocedure Evaluation (Signed)
Anesthesia Post Note  Patient: Randy Kennedy  Procedure(s) Performed: L4-5 MINIMALLY INVASIVE (MIS) TRANSFORAMINAL LUMBAR INTERBODY FUSION (TLIF) APPLICATION OF INTRAOPERATIVE CT SCAN  Patient location during evaluation: PACU Anesthesia Type: General Level of consciousness: awake and alert Pain management: pain level controlled Vital Signs Assessment: post-procedure vital signs reviewed and stable Respiratory status: spontaneous breathing, nonlabored ventilation, respiratory function stable and patient connected to nasal cannula oxygen Cardiovascular status: blood pressure returned to baseline and stable Postop Assessment: no apparent nausea or vomiting Anesthetic complications: no   No notable events documented.   Last Vitals:  Vitals:   11/27/22 1745 11/27/22 1755  BP: 98/71   Pulse: (!) 59 (!) 58  Resp: 12 10  Temp:    SpO2: 98% 96%    Last Pain:  Vitals:   11/27/22 1755  TempSrc:   PainSc: 10-Worst pain ever                 Cleda Mccreedy Lashun Mccants

## 2022-11-28 MED ORDER — SPIRONOLACTONE 25 MG PO TABS
25.0000 mg | ORAL_TABLET | ORAL | Status: DC
  Administered 2022-11-28: 25 mg via ORAL
  Filled 2022-11-28 (×2): qty 1

## 2022-11-28 MED ORDER — SPIRONOLACTONE 12.5 MG HALF TABLET
12.5000 mg | ORAL_TABLET | ORAL | Status: DC
  Administered 2022-11-29: 12.5 mg via ORAL
  Filled 2022-11-28: qty 1

## 2022-11-28 MED ORDER — ORAL CARE MOUTH RINSE: 15.0000 mL | OROMUCOSAL | Status: DC | PRN

## 2022-11-28 MED ORDER — CELECOXIB 200 MG PO CAPS
200.0000 mg | ORAL_CAPSULE | Freq: Two times a day (BID) | ORAL | Status: DC
  Administered 2022-11-28 – 2022-11-29 (×2): 200 mg via ORAL
  Filled 2022-11-28 (×2): qty 1
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
## 2022-11-28 NOTE — Progress Notes (Addendum)
    Attending Progress Note  History: KEYUN TRENARY is here for spondylolisthesis of the lumbar region.  POD1: Expected pain, but doing well.  Ambulated in halls  Physical Exam: Vitals:   11/28/22 0458 11/28/22 0752  BP: 127/71 128/72  Pulse: 64 60  Resp: 18 17  Temp: 98.2 F (36.8 C) 97.7 F (36.5 C)  SpO2: 97% 96%    AA Ox3 CNI  Strength:5/5 throughout BLE except L foot drop with 4- L DF Dressing c/d/I  Drain 110  Data:  Other tests/results: n/a  Assessment/Plan:  [name] is doing well.  - mobilize - pain control - DVT prophylaxis - PTOT    Venetia Night MD, [facility] Department of Neurosurgery
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
## 2022-11-29 MED ORDER — CELECOXIB 200 MG PO CAPS: 200.0000 mg | ORAL_CAPSULE | Freq: Two times a day (BID) | ORAL | 0 refills | Status: DC

## 2022-11-29 MED ORDER — OXYCODONE HCL 5 MG PO TABS: 5.0000 mg | ORAL_TABLET | ORAL | 0 refills | Status: AC | PRN

## 2022-11-29 MED ORDER — SENNA 8.6 MG PO TABS: 1.0000 | ORAL_TABLET | Freq: Two times a day (BID) | ORAL | 0 refills | Status: DC | PRN

## 2022-11-29 MED ORDER — METHOCARBAMOL 500 MG PO TABS: 500.0000 mg | ORAL_TABLET | Freq: Four times a day (QID) | ORAL | 0 refills | Status: DC | PRN

## 2022-11-29 NOTE — Plan of Care (Signed)
  Problem: Education: Goal: Ability to verbalize activity precautions or restrictions will improve Outcome: Adequate for Discharge Goal: Knowledge of the prescribed therapeutic regimen will improve Outcome: Adequate for Discharge Goal: Understanding of discharge needs will improve Outcome: Adequate for Discharge   Problem: Activity: Goal: Ability to avoid complications of mobility impairment will improve Outcome: Adequate for Discharge Goal: Ability to tolerate increased activity will improve Outcome: Adequate for Discharge Goal: Will remain free from falls Outcome: Adequate for Discharge   Problem: Bowel/Gastric: Goal: Gastrointestinal status for postoperative course will improve Outcome: Adequate for Discharge   Problem: Clinical Measurements: Goal: Ability to maintain clinical measurements within normal limits will improve Outcome: Adequate for Discharge Goal: Postoperative complications will be avoided or minimized Outcome: Adequate for Discharge Goal: Diagnostic test results will improve Outcome: Adequate for Discharge   Problem: Pain Management: Goal: Pain level will decrease Outcome: Adequate for Discharge   Problem: Skin Integrity: Goal: Will show signs of wound healing Outcome: Adequate for Discharge   Problem: Health Behavior/Discharge Planning: Goal: Identification of resources available to assist in meeting health care needs will improve Outcome: Adequate for Discharge   Problem: Bladder/Genitourinary: Goal: Urinary functional status for postoperative course will improve Outcome: Adequate for Discharge   Problem: Education: Goal: Knowledge of General Education information will improve Description: Including pain rating scale, medication(s)/side effects and non-pharmacologic comfort measures Outcome: Adequate for Discharge   Problem: Health Behavior/Discharge Planning: Goal: Ability to manage health-related needs will improve Outcome: Adequate for  Discharge   Problem: Clinical Measurements: Goal: Ability to maintain clinical measurements within normal limits will improve Outcome: Adequate for Discharge Goal: Will remain free from infection Outcome: Adequate for Discharge Goal: Diagnostic test results will improve Outcome: Adequate for Discharge Goal: Respiratory complications will improve Outcome: Adequate for Discharge Goal: Cardiovascular complication will be avoided Outcome: Adequate for Discharge   Problem: Activity: Goal: Risk for activity intolerance will decrease Outcome: Adequate for Discharge   Problem: Nutrition: Goal: Adequate nutrition will be maintained Outcome: Adequate for Discharge   Problem: Coping: Goal: Level of anxiety will decrease Outcome: Adequate for Discharge   Problem: Elimination: Goal: Will not experience complications related to bowel motility Outcome: Adequate for Discharge Goal: Will not experience complications related to urinary retention Outcome: Adequate for Discharge   Problem: Pain Managment: Goal: General experience of comfort will improve Outcome: Adequate for Discharge   Problem: Safety: Goal: Ability to remain free from injury will improve Outcome: Adequate for Discharge   Problem: Skin Integrity: Goal: Risk for impaired skin integrity will decrease Outcome: Adequate for Discharge   Problem: Acute Rehab PT Goals(only PT should resolve) Goal: Pt Will Ambulate Outcome: Adequate for Discharge Goal: Pt/caregiver will Perform Home Exercise Program Outcome: Adequate for Discharge
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
## 2022-11-29 NOTE — Progress Notes (Signed)
    Attending Progress Note  History: Randy Kennedy is here for spondylolisthesis of the lumbar region.  POD2: Doing better. POD1: Expected pain, but doing well.  Ambulated in halls  Physical Exam: Vitals:   11/29/22 0423 11/29/22 0728  BP: (!) 159/76 131/78  Pulse: 62 (!) 59  Resp: 20 15  Temp: 97.8 F (36.6 C) 98.1 F (36.7 C)  SpO2: 100% 98%    AA Ox3 CNI  Strength:5/5 throughout BLE except L foot drop with 4- L DF Dressing c/d/I  Drain 90  Data:  Other tests/results: n/a  Assessment/Plan:  Randy Kennedy is doing well.  - mobilize - pain control - DVT prophylaxis - PTOT    Venetia Night MD, [facility] Department of Neurosurgery

## 2022-11-29 NOTE — Discharge Summary (Signed)
Physician Discharge Summary  Patient ID: Randy Kennedy MRN: [id number] DOB/AGE: 11/23/1950 72 y.o.  Admit date: 11/27/2022 Discharge date: 11/29/2022  Admission Diagnoses:  Spondylolisthesis of lumbar region   Chronic bilateral low back pain with bilateral sciatica  Discharge Diagnoses:  Principal Problem:   S/P spinal fusion Active Problems:   Spondylolisthesis of lumbar region   Chronic bilateral low back pain with bilateral sciatica   Discharged Condition: good  Hospital Course: Randy Kennedy was admitted for elective back surgery.  He did well with surgery and met goals for discharge on POD2.  Consults: None  Significant Diagnostic Studies: radiology: X-Ray: good placement of implants  Treatments: surgery: L4-5 TLIF  Discharge Exam: Blood pressure 131/78, pulse (!) 59, temperature 98.1 F (36.7 C), resp. rate 15, height 5\' 7"  (1.702 m), weight 105.4 kg, SpO2 98 %. General appearance: alert and cooperative CNI MAEW 5/5 except baseline L foot drop  Disposition: Discharge disposition: 01-Home or Self Care       Discharge Instructions     Discharge patient   Complete by: As directed    Discharge disposition: 01-Home or Self Care   Discharge patient date: 11/29/2022   Incentive spirometry RT   Complete by: As directed       Allergies as of 11/29/2022       Reactions   Amlodipine Rash   Rash in groin   Allopurinol Other (See Comments)   GI upset   Lisinopril-hydrochlorothiazide    Burning in feet.   Procardia [nifedipine] Other (See Comments)   Bradycardia, leg swelling   Spironolactone Other (See Comments)   malaise, dizziness   Clonidine Derivatives Rash   Doxazosin Mesylate Rash   Latex Rash   Nortriptyline Rash   Uloric [febuxostat] Rash        Medication List     STOP taking these medications    meloxicam 15 MG tablet Commonly known as: MOBIC       TAKE these medications    ascorbic acid 500 MG tablet Commonly known as: VITAMIN  C Take 500 mg by mouth daily.   aspirin EC 81 MG tablet Take 81 mg by mouth daily.   carvedilol 25 MG tablet Commonly known as: COREG Take 1 tablet (25 mg total) by mouth 2 (two) times daily with a meal.   celecoxib 200 MG capsule Commonly known as: CELEBREX Take 1 capsule (200 mg total) by mouth 2 (two) times daily.   colchicine 0.6 MG tablet TAKE ONE TABLET BY MOUTH ONCE DAILY What changed: when to take this   fexofenadine 180 MG tablet Commonly known as: ALLEGRA Take 180 mg by mouth every morning.   fluticasone 50 MCG/ACT nasal spray Commonly known as: FLONASE Place 2 sprays into both nostrils daily. What changed:  when to take this reasons to take this   furosemide 20 MG tablet Commonly known as: LASIX TAKE ONE TABLET BY MOUTH three DAYS A WEEK What changed: See the new instructions.   gabapentin 300 MG capsule Commonly known as: NEURONTIN TAKE ONE CAPSULE BY MOUTH TWICE DAILY and TAKE TWO CAPSULES EVERYDAY AT BEDTIME What changed: See the new instructions.   hydrALAZINE 100 MG tablet Commonly known as: APRESOLINE TAKE 1 AND 1/2 TABLETS BY MOUTH THREE TIMES DAILY, MAY increase TO FOUR TIMES DAILY AS NEEDED FOR HIGH BLOOD PRESSURE What changed:  how much to take how to take this when to take this   HYDROcodone-acetaminophen 10-325 MG tablet Commonly known as: NORCO Take 1 tablet by mouth  2 (two) times daily as needed for moderate pain (sparingly).   methocarbamol 500 MG tablet Commonly known as: ROBAXIN Take 1 tablet (500 mg total) by mouth every 6 (six) hours as needed for muscle spasms.   minoxidil 2.5 MG tablet Commonly known as: LONITEN TAKE ONE TABLET BY MOUTH EVERYDAY AT BEDTIME What changed:  how much to take how to take this when to take this   montelukast 10 MG tablet Commonly known as: SINGULAIR Take 10 mg by mouth at bedtime.   nitroGLYCERIN 0.4 MG SL tablet Commonly known as: NITROSTAT Place 1 tablet (0.4 mg total) under the tongue  every 5 (five) minutes as needed for chest pain.   oxyCODONE 5 MG immediate release tablet Commonly known as: Oxy IR/ROXICODONE Take 1 tablet (5 mg total) by mouth every 4 (four) hours as needed for up to 7 days for moderate pain ((score 4 to 6)).   pravastatin 40 MG tablet Commonly known as: PRAVACHOL Take 1 tablet (40 mg total) by mouth daily. What changed: when to take this   probenecid 500 MG tablet Commonly known as: BENEMID TAKE 1/2 TABLET BY MOUTH TWICE DAILY What changed: additional instructions   senna 8.6 MG Tabs tablet Commonly known as: SENOKOT Take 1 tablet (8.6 mg total) by mouth 2 (two) times daily as needed for mild constipation or moderate constipation.   spironolactone 25 MG tablet Commonly known as: ALDACTONE TAKE 1/2 TABLET BY MOUTH ONCE DAILY What changed: when to take this   valsartan 320 MG tablet Commonly known as: DIOVAN Take 1 tablet (320 mg total) by mouth daily. What changed: when to take this        Follow-up Information     Drake Leach, PA-C Follow up on 12/09/2022.   Specialty: Neurosurgery Contact information: [address] [place] [address] [phone]                 Signed: Venetia Night 11/29/2022, 10:02 AM

## 2022-11-29 NOTE — Progress Notes (Signed)
Reviewed discharge instructions with pt. Pt verbalized understanding. Pt discharged with all personal belongings. Staff wheeled pt out. Pt transported to home via family vehicle.  

## 2022-11-30 ENCOUNTER — Ambulatory Visit: Payer: Self-pay

## 2022-11-30 ENCOUNTER — Encounter: Payer: Self-pay | Admitting: Neurosurgery

## 2022-11-30 ENCOUNTER — Telehealth: Payer: Self-pay | Admitting: *Deleted

## 2022-11-30 NOTE — Chronic Care Management (AMB) (Signed)
   11/30/2022  Randy Kennedy May 28, 1951 [id number]   Reason for Encounter: Patient is not currently enrolled in the CCM program. CCM status changed to previously enrolled  Alto Denver RN, MSN, CCM RN Care Manager  Chronic Care Management Direct Number: [phone]

## 2022-11-30 NOTE — Transitions of Care (Post Inpatient/ED Visit) (Signed)
   11/30/2022  Name: AAQIB FREUDENBERG MRN: [id number] DOB: 1951-07-10  Today's TOC FU Call Status: Today's TOC FU Call Status:: Unsuccessul Call (1st Attempt) Unsuccessful Call (1st Attempt) Date: 11/30/22  Attempted to reach the patient regarding the most recent Inpatient/ED visit.  Follow Up Plan: Additional outreach attempts will be made to reach the patient to complete the Transitions of Care (Post Inpatient/ED visit) call.   Gean Maidens BSN RN [facility] Care Management [phone]

## 2022-12-01 ENCOUNTER — Telehealth: Payer: Self-pay | Admitting: Family Medicine

## 2022-12-01 ENCOUNTER — Telehealth: Payer: Self-pay | Admitting: *Deleted

## 2022-12-01 DIAGNOSIS — Z4789 Encounter for other orthopedic aftercare: Secondary | ICD-10-CM | POA: Diagnosis not present

## 2022-12-01 DIAGNOSIS — G4733 Obstructive sleep apnea (adult) (pediatric): Secondary | ICD-10-CM | POA: Diagnosis not present

## 2022-12-01 DIAGNOSIS — I10 Essential (primary) hypertension: Secondary | ICD-10-CM | POA: Diagnosis not present

## 2022-12-01 DIAGNOSIS — G8929 Other chronic pain: Secondary | ICD-10-CM | POA: Diagnosis not present

## 2022-12-01 DIAGNOSIS — E785 Hyperlipidemia, unspecified: Secondary | ICD-10-CM | POA: Diagnosis not present

## 2022-12-01 DIAGNOSIS — Z6836 Body mass index (BMI) 36.0-36.9, adult: Secondary | ICD-10-CM | POA: Diagnosis not present

## 2022-12-01 DIAGNOSIS — Z981 Arthrodesis status: Secondary | ICD-10-CM | POA: Diagnosis not present

## 2022-12-01 DIAGNOSIS — M109 Gout, unspecified: Secondary | ICD-10-CM | POA: Diagnosis not present

## 2022-12-01 NOTE — Transitions of Care (Post Inpatient/ED Visit) (Signed)
   12/01/2022  Name: Randy Kennedy MRN: [id number] DOB: 04/29/1951  Today's TOC FU Call Status: Today's TOC FU Call Status:: Unsuccessful Call (2nd Attempt) Unsuccessful Call (2nd Attempt) Date: 12/01/22  Attempted to reach the patient regarding the most recent Inpatient/ED visit.  Follow Up Plan: Additional outreach attempts will be made to reach the patient to complete the Transitions of Care (Post Inpatient/ED visit) call.   Gean Maidens BSN RN [facility] Care Management [phone]

## 2022-12-01 NOTE — Telephone Encounter (Signed)
Stacy from [facility] called over and stated that when she put the patient medications in she received a level 2 interaction between colchicine 0.6 MG tablet and carvedilol (COREG) 25 MG tablet. Thank you!

## 2022-12-01 NOTE — Telephone Encounter (Addendum)
Plz notify pt - colchicine and carvedilol can interact, increasing risk of colchicine toxicity.  Would recommend he only use colchicine PRN gout flare, continue probenecid 1/2 tab twice daily.

## 2022-12-02 NOTE — Telephone Encounter (Signed)
Spoke with [facility] relaying Dr. Timoteo Expose message. Verbalizes understanding and will document.

## 2022-12-02 NOTE — Telephone Encounter (Signed)
Lvm asking pt to call back. Need to relay Dr. Timoteo Expose message about meds.

## 2022-12-03 ENCOUNTER — Telehealth: Payer: Self-pay | Admitting: *Deleted

## 2022-12-03 NOTE — Transitions of Care (Post Inpatient/ED Visit) (Signed)
   12/03/2022  Name: Randy Kennedy MRN: [id number] DOB: 1950/11/24  Today's TOC FU Call Status: Today's TOC FU Call Status:: Unsuccessful Call (3rd Attempt) Unsuccessful Call (3rd Attempt) Date: 12/03/22  Attempted to reach the patient regarding the most recent Inpatient/ED visit.  Follow Up Plan: No further outreach attempts will be made at this time. We have been unable to contact the patient.  Gean Maidens BSN RN [facility] [phone]

## 2022-12-03 NOTE — Telephone Encounter (Signed)
Spoke with pt relaying Dr. G's message. Pt verbalizes understanding.  

## 2022-12-07 ENCOUNTER — Telehealth: Payer: Self-pay

## 2022-12-07 NOTE — Telephone Encounter (Signed)
Pt's wife called back to report that Randy Kennedy's temp is 97.2.  Randy Kennedy and I reviewed the mychart message with his incision, which seems to be healing appropriately and shows no sign of infection.  We are of the impression that his nausea may be due to taking oxycodone on an empty stomach. We discussed that he should try eating and taking the oxycodone with food. If it continues, they should call us back to send in nausea medicine vs d/c the oxycodone and increase the frequency of the norco. They will consider adding tylenol (accounting for the tylenol in the norco). I also advised her to call back if he develops any temp above 100.4 or drainage/redness at his incision. Randy Kennedy verbalized understanding of the above. Randy Kennedy has a follow up appointment on 12/09/22, but they are aware they can call with any questions or concerns in the meantime.

## 2022-12-07 NOTE — Telephone Encounter (Signed)
Pt's wife called in to report that Randy Kennedy is experiencing chills and nausea that started last night. His daughter is a Engineer, civil (consulting) and recommended they call the surgeon.   BP: 135/78 HR 62, They do not have a thermometer, but wife reports he doesn't feel warm to the touch. She denies drainage from incisions. He is not experiencing any new/worsening pain.  He is currently taking:  Methocarbamol Celebrex Gabapentin Oxycodone 5mg  every 4 hours - last dose around 3am. He thinks he took it on an empty stomach and he thinks the nausea started after this. Norco 10-325 - last dose was yesterday - usually takes it once a day  After discussing the above, she is going to purchase a thermometer and call me back after he eats some food and she is able to take his temperature. She will also send a picture of his incision through mychart.

## 2022-12-07 NOTE — Progress Notes (Unsigned)
   REFERRING PHYSICIAN:  Eustaquio Boyden, Md [address] [place],  [place] [address]  DOS:  11/27/22   L4-L5 TLIF/PSF   HISTORY OF PRESENT ILLNESS: Randy Kennedy is approximately 2 weeks status post L4-L5 TLIF/PSF. Was given celebrex, robaxin, and oxydcodone on discharge from the hospital.   He was on chronic norco 10 bid prior to surgery. He was to continue this and take prn oxycodone for breakthru pain. He is out of the oxycodone.   He called about nausea on Monday that we felt was likely due to oxycodone on an empty stomach- this has resolved.   He notes some aching in his lower back with some numbness in right thigh with standing. Left leg pain is about the same.    PHYSICAL EXAMINATION:  General: Patient is well developed, well nourished, calm, collected, and in no apparent distress.   NEUROLOGICAL:  General: In no acute distress.   Awake, alert, oriented to person, place, and time.  Pupils equal round and reactive to light.  Facial tone is symmetric.     Strength:            Side Iliopsoas Quads Hamstring PF DF EHL  R 5 5 5 5 5 5   L 5 5 5 5 4 4    Incisions are c/d/i   ROS (Neurologic):  Negative except as noted above  IMAGING: Nothing new to review.   ASSESSMENT/PLAN:  Randy Kennedy is doing well s/p above surgery. Treatment options reviewed with patient and following plan made:   - I have advised the patient to lift up to 10 pounds until 6 weeks after surgery (follow up with Dr. Myer Haff).  - Reviewed wound care.  - No bending, twisting, or lifting.  - Continue on current medications including chronic norco 10. Continue celebrex and robaxin.  - Given refill of oxycodone to have for break through pain. PMP reviewed and is appropriate.  - Follow up as scheduled in 4 weeks and prn.   Advised to contact the office if any questions or concerns arise.  Drake Leach PA-C Department of neurosurgery

## 2022-12-08 ENCOUNTER — Encounter (HOSPITAL_BASED_OUTPATIENT_CLINIC_OR_DEPARTMENT_OTHER): Payer: Self-pay

## 2022-12-08 DIAGNOSIS — I10 Essential (primary) hypertension: Secondary | ICD-10-CM

## 2022-12-08 MED ORDER — SPIRONOLACTONE 25 MG PO TABS
25.0000 mg | ORAL_TABLET | Freq: Every day | ORAL | 3 refills | Status: DC
Start: 2022-12-08 — End: 2023-08-30

## 2022-12-09 ENCOUNTER — Ambulatory Visit (INDEPENDENT_AMBULATORY_CARE_PROVIDER_SITE_OTHER): Payer: PPO | Admitting: Orthopedic Surgery

## 2022-12-09 ENCOUNTER — Encounter: Payer: Self-pay | Admitting: Orthopedic Surgery

## 2022-12-09 VITALS — BP 130/77 | Temp 97.7°F | Ht 67.0 in | Wt 232.0 lb

## 2022-12-09 DIAGNOSIS — Z981 Arthrodesis status: Secondary | ICD-10-CM

## 2022-12-09 DIAGNOSIS — M5441 Lumbago with sciatica, right side: Secondary | ICD-10-CM

## 2022-12-09 DIAGNOSIS — Z09 Encounter for follow-up examination after completed treatment for conditions other than malignant neoplasm: Secondary | ICD-10-CM

## 2022-12-09 DIAGNOSIS — M4316 Spondylolisthesis, lumbar region: Secondary | ICD-10-CM

## 2022-12-09 DIAGNOSIS — M5442 Lumbago with sciatica, left side: Secondary | ICD-10-CM

## 2022-12-09 MED ORDER — OXYCODONE HCL 5 MG PO TABS
5.0000 mg | ORAL_TABLET | Freq: Four times a day (QID) | ORAL | 0 refills | Status: DC | PRN
Start: 2022-12-09 — End: 2022-12-24

## 2022-12-10 ENCOUNTER — Ambulatory Visit (INDEPENDENT_AMBULATORY_CARE_PROVIDER_SITE_OTHER): Payer: PPO

## 2022-12-10 DIAGNOSIS — I495 Sick sinus syndrome: Secondary | ICD-10-CM | POA: Diagnosis not present

## 2022-12-10 LAB — CUP PACEART REMOTE DEVICE CHECK
Battery Remaining Longevity: 116 mo
Battery Remaining Percentage: 95.5 %
Battery Voltage: 3.02 V
Brady Statistic AP VP Percent: 1.9 %
Brady Statistic AP VS Percent: 98 %
Brady Statistic AS VP Percent: 1 %
Brady Statistic AS VS Percent: 1 %
Brady Statistic RA Percent Paced: 99 %
Brady Statistic RV Percent Paced: 1.9 %
Date Time Interrogation Session: 20240516052312
Implantable Lead Connection Status: 753985
Implantable Lead Connection Status: 753985
Implantable Lead Implant Date: 20230804
Implantable Lead Implant Date: 20230804
Implantable Lead Location: 753859
Implantable Lead Location: 753860
Implantable Pulse Generator Implant Date: 20230804
Lead Channel Impedance Value: 460 Ohm
Lead Channel Impedance Value: 490 Ohm
Lead Channel Pacing Threshold Amplitude: 0.5 V
Lead Channel Pacing Threshold Amplitude: 0.625 V
Lead Channel Pacing Threshold Pulse Width: 0.5 ms
Lead Channel Pacing Threshold Pulse Width: 0.5 ms
Lead Channel Sensing Intrinsic Amplitude: 12 mV
Lead Channel Sensing Intrinsic Amplitude: 3.1 mV
Lead Channel Setting Pacing Amplitude: 0.875
Lead Channel Setting Pacing Amplitude: 1.5 V
Lead Channel Setting Pacing Pulse Width: 0.5 ms
Lead Channel Setting Sensing Sensitivity: 2 mV
Pulse Gen Model: 2272
Pulse Gen Serial Number: 8101269

## 2022-12-24 MED ORDER — OXYCODONE HCL 5 MG PO TABS
5.0000 mg | ORAL_TABLET | Freq: Three times a day (TID) | ORAL | 0 refills | Status: DC | PRN
Start: 2022-12-24 — End: 2023-01-07

## 2022-12-24 NOTE — Telephone Encounter (Signed)
DOS:  11/27/22   L4-L5 TLIF/PSF   PMP reviewed and is appropriate. Will change directions to every 8 hours. Patient sent message.   Refill for oxycodone sent to pharmacy.

## 2022-12-24 NOTE — Progress Notes (Signed)
Remote pacemaker transmission.   

## 2022-12-24 NOTE — Addendum Note (Signed)
Addended byDrake Leach on: 12/24/2022 05:01 PM   Modules accepted: Orders

## 2022-12-26 ENCOUNTER — Other Ambulatory Visit: Payer: Self-pay | Admitting: Neurosurgery

## 2023-01-06 ENCOUNTER — Other Ambulatory Visit: Payer: Self-pay

## 2023-01-06 DIAGNOSIS — M4316 Spondylolisthesis, lumbar region: Secondary | ICD-10-CM

## 2023-01-07 ENCOUNTER — Ambulatory Visit
Admission: RE | Admit: 2023-01-07 | Discharge: 2023-01-07 | Disposition: A | Discharge status: Home / Self Care | Payer: PPO | Attending: Neurosurgery | Admitting: Neurosurgery

## 2023-01-07 ENCOUNTER — Ambulatory Visit (INDEPENDENT_AMBULATORY_CARE_PROVIDER_SITE_OTHER): Payer: PPO | Admitting: Neurosurgery

## 2023-01-07 ENCOUNTER — Ambulatory Visit
Admission: RE | Admit: 2023-01-07 | Discharge: 2023-01-07 | Disposition: A | Discharge status: Home / Self Care | Payer: PPO | Source: Ambulatory Visit | Attending: Neurosurgery | Admitting: Neurosurgery

## 2023-01-07 ENCOUNTER — Encounter: Payer: Self-pay | Admitting: Neurosurgery

## 2023-01-07 VITALS — BP 130/82 | Temp 98.1°F | Ht 67.0 in | Wt 232.0 lb

## 2023-01-07 DIAGNOSIS — Z09 Encounter for follow-up examination after completed treatment for conditions other than malignant neoplasm: Secondary | ICD-10-CM

## 2023-01-07 DIAGNOSIS — M4316 Spondylolisthesis, lumbar region: Secondary | ICD-10-CM | POA: Diagnosis not present

## 2023-01-07 DIAGNOSIS — M4326 Fusion of spine, lumbar region: Secondary | ICD-10-CM | POA: Diagnosis not present

## 2023-01-07 NOTE — Progress Notes (Signed)
   REFERRING PHYSICIAN:  Eustaquio Boyden, Md [address] [place],  [place] [address]  DOS:  11/27/22   L4-L5 TLIF/PSF   HISTORY OF PRESENT ILLNESS: JUSTINIAN MIANO is status post L4-L5 TLIF/PSF.   He is making slow and steady improvement.  His pain is 5-6 out of 10 versus 8 out of 10 prior to surgery.  He feels like he has some soreness in his back.  He has no leg pain.  That has gotten better.  PHYSICAL EXAMINATION:  General: Patient is well developed, well nourished, calm, collected, and in no apparent distress.   NEUROLOGICAL:  General: In no acute distress.   Awake, alert, oriented to person, place, and time.  Pupils equal round and reactive to light.  Facial tone is symmetric.     Strength:            Side Iliopsoas Quads Hamstring PF DF EHL  R 5 5 5 5 5 5   L 5 5 5 5 4 4    Incisions are c/d/i   ROS (Neurologic):  Negative except as noted above  IMAGING: No complications noted  ASSESSMENT/PLAN:  CEBASTIAN NEIS is doing well s/p above surgery.   He is making slow and steady improvement.  He will continue with physical therapy.  Will see him back in 6 weeks.  He is now on a 25 pound lifting limit.  Venetia Night, MD  Department of neurosurgery

## 2023-02-12 ENCOUNTER — Other Ambulatory Visit: Payer: Self-pay | Admitting: Orthopedic Surgery

## 2023-02-12 DIAGNOSIS — M4316 Spondylolisthesis, lumbar region: Secondary | ICD-10-CM

## 2023-02-12 DIAGNOSIS — Z981 Arthrodesis status: Secondary | ICD-10-CM

## 2023-02-12 NOTE — Progress Notes (Unsigned)
   REFERRING PHYSICIAN:  Eustaquio Boyden, Md [address] [place],  [place] [address]  DOS:  11/27/22   L4-L5 TLIF/PSF   HISTORY OF PRESENT ILLNESS: Randy Kennedy is almost 3 months status post L4-L5 TLIF/PSF. He was doing well at his last visit.   He continues with LBP after prolonged standing/walking. No pain with sitting. He feels worse then he did prior to his surgery. He has intermittent numbness in right leg. His preop right leg pain is better. His chronic left leg pain is unchanged.    He continues on chronic norco 10 bid from PCP. He is taking neurontin as well.     PHYSICAL EXAMINATION:  General: Patient is well developed, well nourished, calm, collected, and in no apparent distress.   NEUROLOGICAL:  General: In no acute distress.   Awake, alert, oriented to person, place, and time.  Pupils equal round and reactive to light.  Facial tone is symmetric.     Strength:          Side Iliopsoas Quads Hamstring PF DF EHL  R 5 5 5 5 5 5   L 5 5 5 5 4 4    Incisions are well healed   ROS (Neurologic):  Negative except as noted above  IMAGING: Lumbar xrays dated 02/16/23: No complications noted.  Radiology report not available for above xrays yet.   ASSESSMENT/PLAN:  Randy Kennedy continues with pain s/p above surgery. Treatment options reviewed with Dr. Myer Haff and following plan made with patient:   - Start outpatient PT for lumbar spine. Orders to [facility] in [place].  - Continue on norco 10 and neurontin from PCP.  - Discussed he would hopefully seen more improvement in next 3 months.  - Follow up with Dr. Myer Haff in 3 months with repeat lumbar xrays.    Advised to contact the office if any questions or concerns arise.  Drake Leach PA-C Department of neurosurgery

## 2023-02-15 ENCOUNTER — Telehealth: Payer: Self-pay

## 2023-02-15 NOTE — Telephone Encounter (Signed)
Spoke to patient's wife about scheduling follow up visit for chronic conditions. We scheduled for 04/02/23. Randy Kennedy [facility]

## 2023-02-16 ENCOUNTER — Ambulatory Visit
Admission: RE | Admit: 2023-02-16 | Discharge: 2023-02-16 | Disposition: A | Discharge status: Home / Self Care | Payer: PPO | Attending: Orthopedic Surgery | Admitting: Orthopedic Surgery

## 2023-02-16 ENCOUNTER — Ambulatory Visit (INDEPENDENT_AMBULATORY_CARE_PROVIDER_SITE_OTHER): Payer: PPO | Admitting: Orthopedic Surgery

## 2023-02-16 ENCOUNTER — Encounter: Payer: Self-pay | Admitting: Orthopedic Surgery

## 2023-02-16 ENCOUNTER — Ambulatory Visit
Admission: RE | Admit: 2023-02-16 | Discharge: 2023-02-16 | Disposition: A | Discharge status: Home / Self Care | Payer: PPO | Source: Ambulatory Visit | Attending: Orthopedic Surgery | Admitting: Orthopedic Surgery

## 2023-02-16 VITALS — BP 115/74 | Temp 97.8°F | Ht 67.0 in | Wt 232.0 lb

## 2023-02-16 DIAGNOSIS — M4316 Spondylolisthesis, lumbar region: Secondary | ICD-10-CM | POA: Insufficient documentation

## 2023-02-16 DIAGNOSIS — M5442 Lumbago with sciatica, left side: Secondary | ICD-10-CM

## 2023-02-16 DIAGNOSIS — M4310 Spondylolisthesis, site unspecified: Secondary | ICD-10-CM | POA: Diagnosis not present

## 2023-02-16 DIAGNOSIS — Z09 Encounter for follow-up examination after completed treatment for conditions other than malignant neoplasm: Secondary | ICD-10-CM

## 2023-02-16 DIAGNOSIS — M5441 Lumbago with sciatica, right side: Secondary | ICD-10-CM

## 2023-02-16 DIAGNOSIS — Z981 Arthrodesis status: Secondary | ICD-10-CM | POA: Diagnosis not present

## 2023-02-16 DIAGNOSIS — M47816 Spondylosis without myelopathy or radiculopathy, lumbar region: Secondary | ICD-10-CM | POA: Diagnosis not present

## 2023-02-16 DIAGNOSIS — M545 Low back pain, unspecified: Secondary | ICD-10-CM | POA: Diagnosis not present

## 2023-02-16 NOTE — Patient Instructions (Addendum)
It was nice to see you today.   I sent PT orders to [facility] in [place]. They should call you, but you can call them at [phone] if you don't hear from them.   You have a follow up with Dr. Myer Haff in 3 months. You will need xrays prior to that visit.   Please call with any questions or concerns.   Drake Leach PA-C [phone]

## 2023-02-19 ENCOUNTER — Other Ambulatory Visit: Payer: Self-pay | Admitting: Family Medicine

## 2023-02-19 DIAGNOSIS — G90522 Complex regional pain syndrome I of left lower limb: Secondary | ICD-10-CM

## 2023-02-19 NOTE — Telephone Encounter (Signed)
Gabapentin Last filled:  11/13/22, #360 Last OV:  11/16/22, pre-op eval Next OV:  04/20/23, f/u

## 2023-03-11 ENCOUNTER — Ambulatory Visit (INDEPENDENT_AMBULATORY_CARE_PROVIDER_SITE_OTHER): Payer: PPO

## 2023-03-11 ENCOUNTER — Encounter (INDEPENDENT_AMBULATORY_CARE_PROVIDER_SITE_OTHER): Payer: Self-pay

## 2023-03-11 DIAGNOSIS — I495 Sick sinus syndrome: Secondary | ICD-10-CM

## 2023-03-11 LAB — CUP PACEART REMOTE DEVICE CHECK
Battery Remaining Longevity: 114 mo
Battery Remaining Percentage: 93 %
Battery Voltage: 3.01 V
Brady Statistic AP VP Percent: 1.7 %
Brady Statistic AP VS Percent: 98 %
Brady Statistic AS VP Percent: 1 %
Brady Statistic AS VS Percent: 1 %
Brady Statistic RA Percent Paced: 99 %
Brady Statistic RV Percent Paced: 1.7 %
Date Time Interrogation Session: 20240815020023
Implantable Lead Connection Status: 753985
Implantable Lead Connection Status: 753985
Implantable Lead Implant Date: 20230804
Implantable Lead Implant Date: 20230804
Implantable Lead Location: 753859
Implantable Lead Location: 753860
Implantable Pulse Generator Implant Date: 20230804
Lead Channel Impedance Value: 460 Ohm
Lead Channel Impedance Value: 490 Ohm
Lead Channel Pacing Threshold Amplitude: 0.625 V
Lead Channel Pacing Threshold Amplitude: 0.75 V
Lead Channel Pacing Threshold Pulse Width: 0.5 ms
Lead Channel Pacing Threshold Pulse Width: 0.5 ms
Lead Channel Sensing Intrinsic Amplitude: 12 mV
Lead Channel Sensing Intrinsic Amplitude: 3.6 mV
Lead Channel Setting Pacing Amplitude: 1 V
Lead Channel Setting Pacing Amplitude: 1.625
Lead Channel Setting Pacing Pulse Width: 0.5 ms
Lead Channel Setting Sensing Sensitivity: 2 mV
Pulse Gen Model: 2272
Pulse Gen Serial Number: 8101269

## 2023-03-23 NOTE — Progress Notes (Signed)
Remote pacemaker transmission.   

## 2023-03-24 ENCOUNTER — Other Ambulatory Visit: Payer: Self-pay | Admitting: Family Medicine

## 2023-03-24 DIAGNOSIS — M109 Gout, unspecified: Secondary | ICD-10-CM

## 2023-03-24 DIAGNOSIS — J3089 Other allergic rhinitis: Secondary | ICD-10-CM

## 2023-03-25 NOTE — Telephone Encounter (Signed)
Probenecid Last filled:  12/02/22, #90 Last OV:  11/16/22, pre-op eval Next OV:  04/02/23, f/u

## 2023-03-26 NOTE — Telephone Encounter (Signed)
Patient called in to follow up on this refill. He stated that he is completely out the medication and needing it before the weekend. Thank you!

## 2023-03-26 NOTE — Telephone Encounter (Signed)
ERx 

## 2023-04-02 ENCOUNTER — Ambulatory Visit: Payer: PPO | Admitting: Family Medicine

## 2023-05-11 ENCOUNTER — Ambulatory Visit: Payer: PPO | Admitting: Neurosurgery

## 2023-05-14 ENCOUNTER — Telehealth: Payer: Self-pay | Admitting: Family Medicine

## 2023-05-14 MED ORDER — PREDNISONE 20 MG PO TABS
ORAL_TABLET | ORAL | 0 refills | Status: DC
Start: 2023-05-14 — End: 2023-12-13

## 2023-05-14 NOTE — Telephone Encounter (Signed)
Lvm asking pt to call back. Need to relay Dr Timoteo Expose message and schedule OV next week for gout flare.

## 2023-05-14 NOTE — Telephone Encounter (Signed)
Patient called in and stated that he is having a gout flare up. He is wanting to know if Prednisone could be sent in for him. He would like it to go to [id number] - [place], [place] - [address]. Thank you!

## 2023-05-14 NOTE — Addendum Note (Signed)
Addended by: Eustaquio Boyden on: 05/14/2023 04:29 PM   Modules accepted: Orders

## 2023-05-14 NOTE — Telephone Encounter (Signed)
Normally, pt needs to be seen for treatment.   Lvm asking pt to call back. Need to relay info above. In the meantime, message will be fwd to Dr Reece Agar.

## 2023-05-14 NOTE — Telephone Encounter (Signed)
Rx prednisone taper sent to pharmacy. Schedule office visit next week for evaluation of gout flare.

## 2023-05-17 NOTE — Telephone Encounter (Signed)
I would still like to see patient, to review plan for future gout flares.

## 2023-05-17 NOTE — Telephone Encounter (Signed)
Spoke to pt, pt states the flare has gotten better, doesn't believe he needs to be evaluated.

## 2023-05-17 NOTE — Telephone Encounter (Signed)
Do you still need to see patient?

## 2023-05-18 NOTE — Telephone Encounter (Signed)
Noted.  Fyi to Dr. G.  

## 2023-05-18 NOTE — Telephone Encounter (Signed)
Left message to return call to our office.  

## 2023-05-18 NOTE — Telephone Encounter (Signed)
Patient returned call,stated that him and Dr Reece Agar had a plan in place.He said that if he has even the smallest symptom that a flare up is about to happen,he will call and make an appointment.

## 2023-05-28 ENCOUNTER — Ambulatory Visit: Payer: PPO | Admitting: Nurse Practitioner

## 2023-06-01 DIAGNOSIS — H538 Other visual disturbances: Secondary | ICD-10-CM | POA: Diagnosis not present

## 2023-06-10 ENCOUNTER — Ambulatory Visit (INDEPENDENT_AMBULATORY_CARE_PROVIDER_SITE_OTHER): Payer: PPO

## 2023-06-10 DIAGNOSIS — I495 Sick sinus syndrome: Secondary | ICD-10-CM

## 2023-06-10 LAB — CUP PACEART REMOTE DEVICE CHECK
Battery Remaining Longevity: 110 mo
Battery Remaining Percentage: 91 %
Battery Voltage: 3.01 V
Brady Statistic AP VP Percent: 1.8 %
Brady Statistic AP VS Percent: 98 %
Brady Statistic AS VP Percent: 1 %
Brady Statistic AS VS Percent: 1 %
Brady Statistic RA Percent Paced: 99 %
Brady Statistic RV Percent Paced: 1.8 %
Date Time Interrogation Session: 20241114020013
Implantable Lead Connection Status: 753985
Implantable Lead Connection Status: 753985
Implantable Lead Implant Date: 20230804
Implantable Lead Implant Date: 20230804
Implantable Lead Location: 753859
Implantable Lead Location: 753860
Implantable Pulse Generator Implant Date: 20230804
Lead Channel Impedance Value: 460 Ohm
Lead Channel Impedance Value: 540 Ohm
Lead Channel Pacing Threshold Amplitude: 0.625 V
Lead Channel Pacing Threshold Amplitude: 0.875 V
Lead Channel Pacing Threshold Pulse Width: 0.5 ms
Lead Channel Pacing Threshold Pulse Width: 0.5 ms
Lead Channel Sensing Intrinsic Amplitude: 12 mV
Lead Channel Sensing Intrinsic Amplitude: 2.7 mV
Lead Channel Setting Pacing Amplitude: 1.125
Lead Channel Setting Pacing Amplitude: 1.625
Lead Channel Setting Pacing Pulse Width: 0.5 ms
Lead Channel Setting Sensing Sensitivity: 2 mV
Pulse Gen Model: 2272
Pulse Gen Serial Number: 8101269

## 2023-06-14 ENCOUNTER — Ambulatory Visit (INDEPENDENT_AMBULATORY_CARE_PROVIDER_SITE_OTHER)
Admission: RE | Admit: 2023-06-14 | Discharge: 2023-06-14 | Disposition: A | Discharge status: Home / Self Care | Payer: PPO | Source: Ambulatory Visit | Attending: Nurse Practitioner | Admitting: Nurse Practitioner

## 2023-06-14 ENCOUNTER — Ambulatory Visit (INDEPENDENT_AMBULATORY_CARE_PROVIDER_SITE_OTHER): Payer: PPO | Admitting: Nurse Practitioner

## 2023-06-14 ENCOUNTER — Encounter: Payer: Self-pay | Admitting: Nurse Practitioner

## 2023-06-14 VITALS — BP 118/70 | HR 69 | Temp 98.1°F | Ht 67.0 in | Wt 236.2 lb

## 2023-06-14 DIAGNOSIS — R609 Edema, unspecified: Secondary | ICD-10-CM | POA: Diagnosis not present

## 2023-06-14 DIAGNOSIS — M25572 Pain in left ankle and joints of left foot: Secondary | ICD-10-CM | POA: Diagnosis not present

## 2023-06-14 DIAGNOSIS — M19072 Primary osteoarthritis, left ankle and foot: Secondary | ICD-10-CM | POA: Diagnosis not present

## 2023-06-14 NOTE — Patient Instructions (Signed)
Nice to see you today I will be in touch with the xray results once I have them Follow up if you do not improve

## 2023-06-14 NOTE — Assessment & Plan Note (Signed)
Patient stepped in a hole in his yard and then pain afterwards.  He did not roll his ankle.  Pending stat x-ray patient will likely need to see orthopedist given complex nature.  Patient has RICE therapy he does have as needed hydrocodone at home that he can use he was instructed not to take Tylenol with hydrocodone.

## 2023-06-14 NOTE — Progress Notes (Signed)
Acute Office Visit  Subjective:     Patient ID: Randy Kennedy, male    DOB: [birth year], 72 y.o.   MRN: [id number]  Chief Complaint  Patient presents with   Ankle Pain    Left ankle pain. States that he rolled his ankle in a hole last Wednesday. Pt complains of not getting better. Pain level 9. Last night has taken OTC ibuprofen     Patient is in today for ankle pain with a history of Arthritis, HLD, HTN, Pacemaker  States that he was working in the yard and stepped into a hole that was last Wednesday. States that it hurt but no crack or pop was heard or felt. Does have history of surgery with hardware in that ankle. States that it has gotten worse since the injury. He has been doing Ice, ace bandage and ibuprofen. States that it did not help. States that he is using crutches but still having diffculty even with those. States it is a sharp stabbing pain that is all the time and worse with weight baring  Review of Systems  Constitutional:  Negative for chills and fever.  Respiratory:  Positive for shortness of breath (baseline).   Cardiovascular:  Positive for leg swelling. Negative for chest pain.  Musculoskeletal:  Positive for joint pain.  Neurological:  Negative for tingling.        Objective:    BP 118/70   Pulse 69   Temp 98.1 F (36.7 C) (Oral)   Ht 5\' 7"  (1.702 m)   Wt 236 lb 3.2 oz (107.1 kg)   SpO2 95%   BMI 36.99 kg/m    Physical Exam Vitals and nursing note reviewed.  Constitutional:      Appearance: Normal appearance.  Cardiovascular:     Rate and Rhythm: Normal rate and regular rhythm.     Pulses:          Dorsalis pedis pulses are 2+ on the right side and 2+ on the left side.       Posterior tibial pulses are 2+ on the right side.     Heart sounds: Normal heart sounds.     Comments: Unable to palpate Left PT due to edema  Cap refill less than 2 seconds  Pulmonary:     Effort: Pulmonary effort is normal.     Breath sounds: Normal breath  sounds.  Musculoskeletal:     Right lower leg: Edema (1+) present.     Left lower leg: Edema (2+) present.     Left ankle: Swelling present. Tenderness present over the medial malleolus. No base of 5th metatarsal tenderness. Decreased range of motion. Normal pulse.  Neurological:     Mental Status: He is alert.     No results found for any visits on 06/14/23.      Assessment & Plan:   Problem List Items Addressed This Visit       Other   Acute left ankle pain - Primary    Patient stepped in a hole in his yard and then pain afterwards.  He did not roll his ankle.  Pending stat x-ray patient will likely need to see orthopedist given complex nature.  Patient has RICE therapy he does have as needed hydrocodone at home that he can use he was instructed not to take Tylenol with hydrocodone.      Relevant Orders   DG Ankle Complete Left    No orders of the defined types were placed in this encounter.  Return if symptoms worsen or fail to improve.  Audria Nine, NP

## 2023-06-15 ENCOUNTER — Other Ambulatory Visit: Payer: Self-pay | Admitting: Nurse Practitioner

## 2023-06-15 DIAGNOSIS — M25572 Pain in left ankle and joints of left foot: Secondary | ICD-10-CM

## 2023-06-21 ENCOUNTER — Encounter: Payer: Self-pay | Admitting: *Deleted

## 2023-06-23 NOTE — Progress Notes (Signed)
Remote pacemaker transmission.   

## 2023-06-28 DIAGNOSIS — H04123 Dry eye syndrome of bilateral lacrimal glands: Secondary | ICD-10-CM | POA: Diagnosis not present

## 2023-07-01 DIAGNOSIS — M19072 Primary osteoarthritis, left ankle and foot: Secondary | ICD-10-CM | POA: Diagnosis not present

## 2023-08-04 ENCOUNTER — Other Ambulatory Visit: Payer: Self-pay | Admitting: Family Medicine

## 2023-08-04 NOTE — Telephone Encounter (Signed)
 Needs OV for additional refills, per Dr Reece Agar (see 05/14/23 phn note).   Request denied.

## 2023-08-16 ENCOUNTER — Other Ambulatory Visit (HOSPITAL_BASED_OUTPATIENT_CLINIC_OR_DEPARTMENT_OTHER): Payer: Self-pay | Admitting: Family

## 2023-08-29 NOTE — Progress Notes (Unsigned)
Date:  08/30/2023   ID:  [name], DOB [birth year], MRN [id number]  Patient Location:  [address] [place] [address]   Provider location:   [facility], [place] office  PCP:  Eustaquio Boyden, MD  Cardiologist:  [facility]  Chief Complaint  Patient presents with   12 month follow up     Patient c/o shortness of breath daily.     History of Present Illness:    Randy Kennedy is a 73 y.o. male  past medical history of RLS, chronic burning in his feet/neuropathy, Chronic legs and arm numbness and tingling.  hypertension, medication intolerances hyperlipidemia,  OSA  ,not been using his bipap/cpap,  atypical chest tightness.  Chronic SOB  sick sinus syndrome ,[employer] dual-chamber pacemaker implanted 02/27/2022  Who presents for follow-up of his bradycardia,  Hypertension  chest pain  LOV June 2023 Presents today with his wife In follow-up today reports feeling about the same Chronic body pain, walks with a cane  Chronic shortness of breath on exertion No red her exercise program  Prior difficulty controlling his hypertension Seen by hypertension clinic in [facility] carvedilol, minoxidil, spironolactone, and valsartan.   On further discussion of his medications, he reports he is not taking spironolactone, made him urinate too much  Blood pressure today 120 systolic Weight trending higher over the past several years  Wife with medical issues  He sleeps in a recliner secondary to sleep apnea Unable to tolerate CPAP  Chronic left ankle swelling from prior injury, minimal on the right He does not take Lasix  covid 06/2020 in to Jan 2022  medication intolerances Headaches, imdur,  Stopped cardura, 4 mg twice daily may have caused a rash Chlorthalidone causes gout Ca channel blocker: Amlodipine (rash) and Procardia leg swelling Clonidine /b-blocker: bradycardia spironolactone, unclear  intolerance  EKG personally reviewed by myself on todays visit EKG Interpretation Date/Time:  Monday August 30 2023 10:30:47 EST Ventricular Rate:  64 PR Interval:  234 QRS Duration:  160 QT Interval:  422 QTC Calculation: 435 R Axis:   -25  Text Interpretation: Atrial-paced rhythm with prolonged AV conduction Right bundle branch block Minimal voltage criteria for LVH, may be normal variant ( R in aVL ) T wave abnormality, consider lateral ischemia When compared with ECG of 28-Feb-2022 10:44, No significant change was found Confirmed by Julien Nordmann [phone]) on 08/30/2023 10:53:18 AM   Echo 06/11/2017 Normal EF, elevated RVSP  Echocardiogram November 2018 Left ventricle: The cavity size was normal. There was moderate   concentric hypertrophy. Systolic function was normal. The   estimated ejection fraction was in the range of 55% to 60%. Wall   motion was normal; there were no regional wall motion   abnormalities. Features are consistent with a pseudonormal left   ventricular filling pattern, with concomitant abnormal relaxation   and increased filling pressure (grade 2 diastolic dysfunction). - Aortic valve: There was trivial regurgitation. - Mitral valve: There was mild regurgitation. - Left atrium: The atrium was mildly dilated. - Right atrium: The atrium was moderately dilated. - Pulmonary arteries: Systolic pressure was moderately increased.   PA peak pressure: 48 mm Hg (S).   Past Medical History:  Diagnosis Date   Allergic rhinitis    Arthritis    Bradycardia    Chest pain    a. 11/2011 normal stress test performed in [place] - see chart.   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 moderate, AHI 37.4 increased to 80 in REM, on BiPAP 14/10, 97% compliance >4 hrs (06/2013); b. Does not tolerate CPAP.   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 Treated with vicodin, prior tried fentanyl and butrans.   Sinus node dysfunction (HCC)    a.) s/p Abbott Assurity MRI dual-chamber pacemaker placement 02/27/2022   Spondylolisthesis of lumbar region    Systolic murmur    Thrombocytopenia (HCC)    Wears dentures    Partial lower   Wears hearing aid in both ears    Does not wear all the time   Past Surgical History:  Procedure Laterality Date   APPLICATION OF INTRAOPERATIVE CT SCAN N/A 11/27/2022   Procedure: APPLICATION OF INTRAOPERATIVE CT SCAN;  Surgeon: Venetia Night, MD;  Location: [facility];  Service: Neurosurgery;  Laterality: N/A;   CATARACT EXTRACTION W/PHACO Right 11/26/2020   Procedure: CATARACT EXTRACTION PHACO AND INTRAOCULAR LENS PLACEMENT (IOC) RIGHT VIVITY TORIC LENS 7.17 00:49.6;   Surgeon: Galen Manila, MD;  Location: [facility];  Service: Ophthalmology;  Laterality: Right;  Latex Sleep Apnea   CATARACT EXTRACTION W/PHACO Left 12/10/2020   Procedure: CATARACT EXTRACTION PHACO AND INTRAOCULAR LENS PLACEMENT (IOC) LEFT VIVITY;  Surgeon: Galen Manila, MD;  Location: [facility];  Service: Ophthalmology;  Laterality: Left;  Latex Sleep apnea 5.37 00:32.7   COLONOSCOPY  02/2011   ext hem, benign polyp, rpt 5 yrs ([place])   COLONOSCOPY WITH PROPOFOL N/A 12/17/2020   multiple TA, ext hem, rpt 3 yrs ([place], Loel Dubonnet, MD)   LEG SURGERY  x5   left - after fall at work (3.5 stories)   MANDIBLE SURGERY  1965   jaw fracture - horse kick   MOHS SURGERY Right 07/03/2021   Ear   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   Procedure: PACEMAKER IMPLANT;  Surgeon: Regan Lemming, MD;  Location: [facility];  Service: Cardiovascular;  Laterality: N/A;   TRANSFORAMINAL LUMBAR INTERBODY FUSION  W/ MIS 1 LEVEL N/A 11/27/2022   Procedure: L4-5 MINIMALLY INVASIVE (MIS) TRANSFORAMINAL LUMBAR INTERBODY FUSION (TLIF);  Surgeon: Venetia Night, MD;  Location: [facility];  Service: Neurosurgery;  Laterality: N/A;   TYMPANIC MEMBRANE REPAIR Left 1994   otosclerosis     Current Meds  Medication Sig   aspirin EC 81 MG tablet Take 81 mg by mouth daily.   carvedilol (COREG) 25 MG tablet Take 1 tablet (25 mg total) by mouth 2 (two) times daily with a meal.   fexofenadine (ALLEGRA) 180 MG tablet Take 180 mg by mouth every morning.   gabapentin (NEURONTIN) 300 MG capsule TAKE 1 CAPSULE BY MOUTH TWICE A DAY AND 2 CAPSULES AT BEDTIME   hydrALAZINE (APRESOLINE) 100 MG tablet Take 100 mg by mouth 3 (three) times daily.   minoxidil (LONITEN) 2.5 MG tablet TAKE 1 TABLET BY MOUTH EVERYDAY AT BEDTIME   montelukast (SINGULAIR) 10 MG tablet TAKE 1 TABLET BY MOUTH EVERYDAY AT BEDTIME   nitroGLYCERIN (NITROSTAT) 0.4 MG SL tablet Place 1 tablet (0.4 mg total) under the tongue  every 5 (five) minutes as needed for chest pain.   pravastatin (PRAVACHOL) 40 MG tablet Take 1 tablet (40 mg total) by mouth daily. (Patient taking differently: Take 40 mg by mouth at bedtime.)   probenecid (BENEMID) 500 MG tablet TAKE ONE-HALF TABLET BY MOUTH TWICE DAILY   valsartan (DIOVAN) 320 MG tablet Take 1 tablet (320 mg total) by mouth daily. (Patient taking differently: Take 320 mg by mouth every morning.)   vitamin C (ASCORBIC ACID) 500 MG tablet Take 500 mg by mouth daily.     Allergies:   Amlodipine, Allopurinol, Lisinopril-hydrochlorothiazide, Procardia [nifedipine], Spironolactone, Clonidine derivatives, Doxazosin mesylate, Latex, Nortriptyline, and Uloric [febuxostat]   Social History   Tobacco Use   Smoking status: Former    Current packs/day: 0.00    Average packs/day: 1 pack/day for 4.0 years (4.0 ttl pk-yrs)    Types: Pipe, Cigarettes    Start date: 07/27/1974    Quit date: 07/27/1978    Years since quitting: 45.1   Smokeless tobacco: Never  Vaping Use   Vaping status: Never Used  Substance Use Topics   Alcohol use: No    Alcohol/week: 0.0 standard drinks of alcohol   Drug use: No     Family Hx: The patient's family history includes Cancer in his mother and paternal uncle; Cancer (age of onset: 75) in his father; Hypertension in his mother. There is no history of Diabetes, Stroke, or CAD.  ROS:   Please see the history of present illness.    Review of Systems  Constitutional: Negative.   Respiratory: Negative.    Cardiovascular: Negative.   Gastrointestinal: Negative.   Musculoskeletal: Negative.   Neurological: Negative.        Numb and tingling  Psychiatric/Behavioral: Negative.    All other systems reviewed and are negative.    Labs/Other Tests and Data Reviewed:    Recent Labs: 11/16/2022: ALT 20; BUN 20; Creatinine, Ser 1.20; Hemoglobin 13.9; Platelets 177.0; Potassium 3.8; Sodium 137   Recent Lipid Panel Lab Results  Component Value Date/Time    CHOL 180 03/18/2022 11:42 AM   CHOL 130 02/09/2012 12:00 AM   TRIG 337.0 (H) 03/18/2022 11:42 AM   TRIG 142 02/09/2012 12:00 AM   HDL 62.00 03/18/2022 11:42 AM   CHOLHDL 3 03/18/2022 11:42 AM   LDLCALC 63 12/29/2021 09:52 AM   LDLCALC 47 02/09/2012 12:00 AM   LDLDIRECT 83.0 03/18/2022 11:42 AM  Wt Readings from Last 3 Encounters:  08/30/23 238 lb 8 oz (108.2 kg)  06/14/23 236 lb 3.2 oz (107.1 kg)  02/16/23 232 lb (105.2 kg)     Exam:    BP 120/72 (BP Location: Left Arm, Patient Position: Sitting, Cuff Size: Large)   Pulse 64   Ht 5\' 7"  (1.702 m)   Wt 238 lb 8 oz (108.2 kg)   SpO2 97%   BMI 37.35 kg/m  Constitutional:  oriented to person, place, and time. No distress.  HENT:  Head: Grossly normal Eyes:  no discharge. No scleral icterus.  Neck: No JVD, no carotid bruits  Cardiovascular: Regular rate and rhythm, no murmurs appreciated Trace to 1+ left ankle swelling, minimal on the right Pulmonary/Chest: Clear to auscultation bilaterally, no wheezes or rails Abdominal: Soft.  no distension.  no tenderness.  Musculoskeletal: Normal range of motion Neurological:  normal muscle tone. Coordination normal. No atrophy Skin: Skin warm and dry Psychiatric: normal affect, pleasant   ASSESSMENT & PLAN:    Morbid obesity (HCC) We have encouraged continued exercise, careful diet management in an effort to lose weight.  Essential hypertension Numerous med intolerances Tolerating carvedilol, minoxidil,  and valsartan.  Reports he is not taking spironolactone secondary to polyuria Blood pressure 120 systolic today Also reports missing afternoon hydralazine Recommend he closely monitor blood pressure in the late afternoon, for spiking pressure will need to restart afternoon hydralazine or restart spironolactone in the morning  OSA (obstructive sleep apnea) Not on cpap Sleeps in recliner  Mixed hyperlipidemia Prior LDL in the 90s, on pravastatin Stressed importance of  calorie restriction for weight loss given trend up in his weight over the past several years He prefers to have lab work done with primary care in April  Chronic fatigue Not wearing his CPAP at night,  no regular exercise Deconditioning, weight trending upwards Recommend exercise program as tolerated given his chronic pain issues  RLS (restless legs syndrome) Chronic stable sx  SOB (shortness of breath) Likely from deconditioning, obesity,  Reports he is no longer on Lasix, also stopped his spironolactone secondary to polyuria Chronic left ankle swelling, minimal on the right  Signed, Julien Nordmann, MD  08/30/2023 10:33 AM    [facility] [address], [place], [place] [address]

## 2023-08-30 ENCOUNTER — Ambulatory Visit: Payer: HMO | Attending: Cardiovascular Disease | Admitting: Cardiovascular Disease

## 2023-08-30 ENCOUNTER — Encounter: Payer: Self-pay | Admitting: Cardiovascular Disease

## 2023-08-30 VITALS — BP 120/72 | HR 64 | Ht 67.0 in | Wt 238.5 lb

## 2023-08-30 DIAGNOSIS — G2581 Restless legs syndrome: Secondary | ICD-10-CM | POA: Diagnosis not present

## 2023-08-30 DIAGNOSIS — I10 Essential (primary) hypertension: Secondary | ICD-10-CM

## 2023-08-30 DIAGNOSIS — I495 Sick sinus syndrome: Secondary | ICD-10-CM | POA: Diagnosis not present

## 2023-08-30 DIAGNOSIS — R0602 Shortness of breath: Secondary | ICD-10-CM | POA: Diagnosis not present

## 2023-08-30 DIAGNOSIS — Z950 Presence of cardiac pacemaker: Secondary | ICD-10-CM | POA: Diagnosis not present

## 2023-08-30 DIAGNOSIS — G4733 Obstructive sleep apnea (adult) (pediatric): Secondary | ICD-10-CM

## 2023-08-30 DIAGNOSIS — R001 Bradycardia, unspecified: Secondary | ICD-10-CM

## 2023-08-30 DIAGNOSIS — E782 Mixed hyperlipidemia: Secondary | ICD-10-CM

## 2023-08-30 MED ORDER — CARVEDILOL 25 MG PO TABS: 25.0000 mg | ORAL_TABLET | Freq: Two times a day (BID) | ORAL | 3 refills | Status: DC

## 2023-08-30 MED ORDER — PRAVASTATIN SODIUM 40 MG PO TABS: 40.0000 mg | ORAL_TABLET | Freq: Every day | ORAL | 3 refills | Status: DC

## 2023-08-30 MED ORDER — HYDRALAZINE HCL 100 MG PO TABS: 100.0000 mg | ORAL_TABLET | Freq: Three times a day (TID) | ORAL | 3 refills | Status: DC

## 2023-08-30 MED ORDER — VALSARTAN 320 MG PO TABS: 320.0000 mg | ORAL_TABLET | Freq: Every day | ORAL | 3 refills | Status: DC

## 2023-08-30 NOTE — Patient Instructions (Signed)
 Medication Instructions:  No changes  If you need a refill on your cardiac medications before your next appointment, please call your pharmacy.   Lab work: No new labs needed  Testing/Procedures: No new testing needed  Follow-Up: At [facility], you and your health needs are our priority.  As part of our continuing mission to provide you with exceptional heart care, we have created designated Provider Care Teams.  These Care Teams include your primary Cardiologist (physician) and Advanced Practice Providers (APPs -  Physician Assistants and Nurse Practitioners) who all work together to provide you with the care you need, when you need it.  You will need a follow up appointment in 12 months  Providers on your designated Care Team:   Nicolasa Ducking, NP Eula Listen, PA-C Cadence Fransico Michael, [place]  COVID-19 Vaccine Information can be found at: [email or web address] For questions related to vaccine distribution or appointments, please email [email or web address] or call [phone].

## 2023-09-07 DIAGNOSIS — H02402 Unspecified ptosis of left eyelid: Secondary | ICD-10-CM | POA: Diagnosis not present

## 2023-09-09 ENCOUNTER — Ambulatory Visit (INDEPENDENT_AMBULATORY_CARE_PROVIDER_SITE_OTHER): Payer: PPO

## 2023-09-09 DIAGNOSIS — I495 Sick sinus syndrome: Secondary | ICD-10-CM

## 2023-09-09 DIAGNOSIS — H02401 Unspecified ptosis of right eyelid: Secondary | ICD-10-CM | POA: Diagnosis not present

## 2023-09-10 LAB — CUP PACEART REMOTE DEVICE CHECK
Battery Remaining Longevity: 108 mo
Battery Remaining Percentage: 89 %
Battery Voltage: 3.01 V
Brady Statistic AP VP Percent: 1.7 %
Brady Statistic AP VS Percent: 98 %
Brady Statistic AS VP Percent: 1 %
Brady Statistic AS VS Percent: 1 %
Brady Statistic RA Percent Paced: 99 %
Brady Statistic RV Percent Paced: 1.7 %
Date Time Interrogation Session: 20250213020017
Implantable Lead Connection Status: 753985
Implantable Lead Connection Status: 753985
Implantable Lead Implant Date: 20230804
Implantable Lead Implant Date: 20230804
Implantable Lead Location: 753859
Implantable Lead Location: 753860
Implantable Pulse Generator Implant Date: 20230804
Lead Channel Impedance Value: 440 Ohm
Lead Channel Impedance Value: 530 Ohm
Lead Channel Pacing Threshold Amplitude: 0.625 V
Lead Channel Pacing Threshold Amplitude: 0.875 V
Lead Channel Pacing Threshold Pulse Width: 0.5 ms
Lead Channel Pacing Threshold Pulse Width: 0.5 ms
Lead Channel Sensing Intrinsic Amplitude: 12 mV
Lead Channel Sensing Intrinsic Amplitude: 2.1 mV
Lead Channel Setting Pacing Amplitude: 1.125
Lead Channel Setting Pacing Amplitude: 1.625
Lead Channel Setting Pacing Pulse Width: 0.5 ms
Lead Channel Setting Sensing Sensitivity: 2 mV
Pulse Gen Model: 2272
Pulse Gen Serial Number: 8101269

## 2023-09-17 NOTE — Progress Notes (Signed)
 Referring Physician:  No referring provider defined for this encounter.  Primary Physician:  Eustaquio Boyden, MD  History of Present Illness: 09/20/2023 Randy Kennedy has a history of HTN, OSA, GERD, RSD right leg, thrombocytopenia, dyslipidemia, TLS, gilbert disease, gout, narcolepsy, obesity.   He has a pacemaker.   He is s/p L4-L5 TLIF/PSF on 11/27/22 with Dr. Myer Haff. Last seen by me on 02/16/23 and he continued with LBP and intermittent numbness in right leg with chronic left leg pain. He was lost to follow up.   He has constant left scapular pain with intermittent neck pain. He has constant numbness/tingling in both hands, left hand > right hand. He notes weakness as well. He is not dropping things. He has pain into left arm to his elbow that is constant. No specific aggravating or alleviating factors.   Also with some constant left sided LBP that radiates to left groin. No right leg pain. No numbness, tingling in his legs. He notes weakness in both legs. Pain is better with sitting against a pillow. Standing and walking make him worse. He thinks his lower back is worse since his surgery.   He is taking neurontin.   He quit smoking in 1980.   Bowel/Bladder Dysfunction: none  Conservative measures:  Physical therapy: none since surgery  Multimodal medical therapy including regular antiinflammatories: neurontin  Injections: No epidural steroid injections since his surgery  Past Surgery:   L4-L5 TLIF/PSF on 11/27/22 with Dr. Ambrose Finland has no symptoms of cervical myelopathy.  The symptoms are causing a significant impact on the patient's life.   Review of Systems:  A 10 point review of systems is negative, except for the pertinent positives and negatives detailed in the HPI.  Past Medical History: Past Medical History:  Diagnosis Date   Allergic rhinitis    Arthritis    Bradycardia    Chest pain    a. 11/2011 normal stress test performed in  [place] - see chart.   Chronic fatigue    Chronic low back pain with bilateral sciatica    Chronic pain    L ankle and bilateral hips (remote fracture s/p surgeries), lower back (told has herniated disc)   Closed avulsion fracture of right talus 06/2016   COVID-19 06/2020   Diastolic dysfunction 02/26/2022   a.) TTE 02/26/2022: EF 60-65%, mod RVE, mod LAE, sev RAE, AoV sclerosis, G2DD   Dyspnea    Eczema    Erectile dysfunction 09/14/2012   Sullivan Lone disease    Gout 2007   Hearing loss    otosclerosis   Heartburn    mild, controlled with pepcid   History of chicken pox    History of hepatitis B    as child (35 years of age), no sequelae   HLD (hyperlipidemia)    HTN (hypertension)    difficult to control - clonidine and beta blockers caused bradycardia   Leg edema    Lobar pneumonia (HCC) 05/13/2022   Morbid obesity (HCC)    Motion sickness    Most moving vehicles   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 moderate, AHI 37.4 increased to 80 in REM, on BiPAP 14/10, 97% compliance >4 hrs (06/2013); b. Does not tolerate CPAP.   Pneumonia    Presence of cardiac pacemaker 02/27/2022   a.) [employer] Assurity MRI dual-chamber pacemaker (SN: [id number] )   RBBB  RLS (restless legs syndrome)    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   Reviewed prior workup (saw pain management at [facility], [place]): chronic back pain with RLS + RSD L leg with severe L post-traumatic ankle joint arthosis, no mention of peripheral neuropathy. Treated with vicodin, prior tried fentanyl and butrans.   Sinus node dysfunction (HCC)    a.) s/p Abbott Assurity MRI dual-chamber pacemaker placement 02/27/2022   Spondylolisthesis of lumbar region    Systolic murmur    Thrombocytopenia (HCC)    Wears dentures    Partial lower   Wears hearing aid in both ears    Does not wear all the time    Past Surgical History: Past Surgical History:  Procedure  Laterality Date   APPLICATION OF INTRAOPERATIVE CT SCAN N/A 11/27/2022   Procedure: APPLICATION OF INTRAOPERATIVE CT SCAN;  Surgeon: Venetia Night, MD;  Location: [facility];  Service: Neurosurgery;  Laterality: N/A;   CATARACT EXTRACTION W/PHACO Right 11/26/2020   Procedure: CATARACT EXTRACTION PHACO AND INTRAOCULAR LENS PLACEMENT (IOC) RIGHT VIVITY TORIC LENS 7.17 00:49.6;  Surgeon: Galen Manila, MD;  Location: [facility];  Service: Ophthalmology;  Laterality: Right;  Latex Sleep Apnea   CATARACT EXTRACTION W/PHACO Left 12/10/2020   Procedure: CATARACT EXTRACTION PHACO AND INTRAOCULAR LENS PLACEMENT (IOC) LEFT VIVITY;  Surgeon: Galen Manila, MD;  Location: [facility];  Service: Ophthalmology;  Laterality: Left;  Latex Sleep apnea 5.37 00:32.7   COLONOSCOPY  02/2011   ext hem, benign polyp, rpt 5 yrs ([place])   COLONOSCOPY WITH PROPOFOL N/A 12/17/2020   multiple TA, ext hem, rpt 3 yrs ([place], Loel Dubonnet, MD)   LEG SURGERY  x5   left - after fall at work (3.5 stories)   MANDIBLE SURGERY  1965   jaw fracture - horse kick   MOHS SURGERY Right 07/03/2021   Ear   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   Procedure: PACEMAKER IMPLANT;  Surgeon: Regan Lemming, MD;  Location: [facility];  Service: Cardiovascular;  Laterality: N/A;   TRANSFORAMINAL LUMBAR INTERBODY FUSION W/ MIS 1 LEVEL N/A 11/27/2022   Procedure: L4-5 MINIMALLY INVASIVE (MIS) TRANSFORAMINAL LUMBAR INTERBODY FUSION (TLIF);  Surgeon: Venetia Night, MD;  Location: [facility];  Service: Neurosurgery;  Laterality: N/A;   TYMPANIC MEMBRANE REPAIR Left 1994   otosclerosis    Allergies: Allergies as of 09/20/2023 - Review Complete 08/30/2023  Allergen Reaction Noted   Amlodipine Rash 06/24/2021   Allopurinol Other (See Comments) 09/13/2012   Lisinopril-hydrochlorothiazide  02/16/2013   Procardia [nifedipine] Other (See Comments) 07/12/2013   Spironolactone Other (See Comments) 05/28/2017    Clonidine derivatives Rash 02/25/2022   Doxazosin mesylate Rash 10/07/2017   Latex Rash 11/14/2020   Nortriptyline Rash 07/01/2018   Uloric [febuxostat] Rash 05/07/2017    Medications: Outpatient Encounter Medications as of 09/20/2023  Medication Sig   aspirin EC 81 MG tablet Take 81 mg by mouth daily.   carvedilol (COREG) 25 MG tablet Take 1 tablet (25 mg total) by mouth 2 (two) times daily with a meal.   colchicine 0.6 MG tablet TAKE ONE TABLET BY MOUTH ONCE DAILY (Patient not taking: Reported on 08/30/2023)   fexofenadine (ALLEGRA) 180 MG tablet Take 180 mg by mouth every morning.   gabapentin (NEURONTIN) 300 MG capsule TAKE 1 CAPSULE BY MOUTH TWICE A DAY AND 2 CAPSULES AT BEDTIME   hydrALAZINE (APRESOLINE) 100 MG tablet Take 1 tablet (100 mg total) by mouth 3 (three) times daily.   HYDROcodone-acetaminophen (NORCO) 10-325 MG  tablet Take 1 tablet by mouth 2 (two) times daily as needed for moderate pain (sparingly). (Patient not taking: Reported on 08/30/2023)   minoxidil (LONITEN) 2.5 MG tablet TAKE 1 TABLET BY MOUTH EVERYDAY AT BEDTIME   montelukast (SINGULAIR) 10 MG tablet TAKE 1 TABLET BY MOUTH EVERYDAY AT BEDTIME   nitroGLYCERIN (NITROSTAT) 0.4 MG SL tablet Place 1 tablet (0.4 mg total) under the tongue every 5 (five) minutes as needed for chest pain.   pravastatin (PRAVACHOL) 40 MG tablet Take 1 tablet (40 mg total) by mouth daily.   predniSONE (DELTASONE) 20 MG tablet Take two tablets daily for 4 days followed by one tablet daily for 4 days (Patient not taking: Reported on 08/30/2023)   probenecid (BENEMID) 500 MG tablet TAKE ONE-HALF TABLET BY MOUTH TWICE DAILY   valsartan (DIOVAN) 320 MG tablet Take 1 tablet (320 mg total) by mouth daily.   vitamin C (ASCORBIC ACID) 500 MG tablet Take 500 mg by mouth daily.   No facility-administered encounter medications on file as of 09/20/2023.    Social History: Social History   Tobacco Use   Smoking status: Former    Current packs/day: 0.00     Average packs/day: 1 pack/day for 4.0 years (4.0 ttl pk-yrs)    Types: Pipe, Cigarettes    Start date: 07/27/1974    Quit date: 07/27/1978    Years since quitting: 45.1   Smokeless tobacco: Never  Vaping Use   Vaping status: Never Used  Substance Use Topics   Alcohol use: No    Alcohol/week: 0.0 standard drinks of alcohol   Drug use: No    Family Medical History: Family History  Problem Relation Age of Onset   Cancer Mother        rectal   Hypertension Mother    Cancer Father 5       prostate   Cancer Paternal Uncle        prostate   Diabetes Neg Hx    Stroke Neg Hx    CAD Neg Hx     Physical Examination: There were no vitals filed for this visit.    Awake, alert, oriented to person, place, and time.  Speech is clear and fluent. Fund of knowledge is appropriate.   Cranial Nerves: Pupils equal round and reactive to light.  Facial tone is symmetric.    No posterior cervical tenderness. No tenderness in bilateral trapezial region.   Mild left scapular tenderness.   Well healed lumbar incisions.   No abnormal lesions on exposed skin.   Strength: Side Biceps Triceps Deltoid Interossei Grip Wrist Ext. Wrist Flex.  R 5 5 5 5 5 5 5   L 5 5 5 5 5 5 5    Side Iliopsoas Quads Hamstring PF DF EHL  R 5 5 5 5 5 5   L 5 5 5 5 5 5    Reflexes are 2+ and symmetric at the biceps, brachioradialis, patella and achilles.   Left achilles not tested. Hoffman's is absent.  Clonus is not present, not tested on left due to ankle pain.  Bilateral upper and lower extremity sensation is intact to light touch.     Gait is abnormal. He limps due to left ankle.   Medical Decision Making  Imaging: Nothing recent.   Assessment and Plan: Mr. Arave is s/p L4-L5 TLIF/PSF on 11/27/22 with Dr. Myer Haff. Last seen by me on 02/16/23 and he continued with LBP and intermittent numbness in right leg with chronic left leg pain. He was  lost to follow up.   He has constant left scapular pain with  intermittent neck pain. He has constant numbness/tingling in both hands, left hand > right hand. He notes weakness as well. He is not dropping things. He has pain into left arm to his elbow that is constant.   Cervical MRI from 2022 showed cervical spondylosis with multilevel foraminal stenosis.   Also with constant left sided LBP that radiates to left groin. No right leg pain. No numbness, tingling in his legs. He notes weakness in both legs. He thinks his lower back is worse since his surgery.   Previous lumbar xrays 02/16/23 showed no complications.   Treatment options discussed with patient and following plan made:   - MRI of cervical spine to evaluate cervical radiculopathy. No improvement with time or medications.  - Xrays of thoracic spine to be done when he has MRIs done.  - MRI of lumbar spine to further evaluate lumbar radiculopathy. No improvement with time or medications.  - He has pacemaker that he got after his last MRI scans. Card scanned into chart. Will have radiology make sure it is MRI compatible. MRI scans to be done at [facility] in [place].  - EMG of bilateral upper extremities to evaluate constant numbness/tingling in both hands. Orders to [facility] in [place].  - Will schedule follow up visit to review MRI/xray results once I get them back. Can do phone follow up for EMG results if they are not back.   I spent a total of 30 minutes in face-to-face and non-face-to-face activities related to this patient's care today including review of outside records, review of imaging, review of symptoms, physical exam, discussion of differential diagnosis, discussion of treatment options, and documentation.   Drake Leach PA-C Dept. of Neurosurgery

## 2023-09-20 ENCOUNTER — Ambulatory Visit: Payer: HMO | Admitting: Orthopedic Surgery

## 2023-09-20 ENCOUNTER — Encounter: Payer: Self-pay | Admitting: Orthopedic Surgery

## 2023-09-20 VITALS — BP 138/86 | Ht 67.0 in | Wt 238.0 lb

## 2023-09-20 DIAGNOSIS — M5412 Radiculopathy, cervical region: Secondary | ICD-10-CM

## 2023-09-20 DIAGNOSIS — M542 Cervicalgia: Secondary | ICD-10-CM | POA: Diagnosis not present

## 2023-09-20 DIAGNOSIS — M5416 Radiculopathy, lumbar region: Secondary | ICD-10-CM | POA: Diagnosis not present

## 2023-09-20 DIAGNOSIS — M4722 Other spondylosis with radiculopathy, cervical region: Secondary | ICD-10-CM

## 2023-09-20 DIAGNOSIS — R202 Paresthesia of skin: Secondary | ICD-10-CM | POA: Diagnosis not present

## 2023-09-20 DIAGNOSIS — G8929 Other chronic pain: Secondary | ICD-10-CM | POA: Diagnosis not present

## 2023-09-20 DIAGNOSIS — R200 Anesthesia of skin: Secondary | ICD-10-CM

## 2023-09-20 DIAGNOSIS — M47812 Spondylosis without myelopathy or radiculopathy, cervical region: Secondary | ICD-10-CM

## 2023-09-20 DIAGNOSIS — M546 Pain in thoracic spine: Secondary | ICD-10-CM

## 2023-09-20 DIAGNOSIS — Z981 Arthrodesis status: Secondary | ICD-10-CM | POA: Diagnosis not present

## 2023-09-20 NOTE — Patient Instructions (Signed)
 It was so nice to see you today. Thank you so much for coming in.   I want to get an MRI of your neck and lower back to look into things further. We will get this approved through your insurance and [facility] in [place] will call you to schedule the appointment.   Radiology will verify that you can have MRI with your pacemaker. We will call you if any concerns.   When you have the MRI scans done, tell them you need to get thoracic xrays done as well. They are ordered.   After you have the MRIs and xrays, it takes 14-21 days for me to get the results back. Once I have them, we will call you to schedule a follow up visit with me to review them.   I want to get an EMG (nerve conduction test) to look into things further. I have ordered this and [facility] will call you to schedule. You can also call them at [phone].   Please do not hesitate to call if you have any questions or concerns. You can also message me in MyChart.   Drake Leach PA-C [phone]     The physicians and staff at [facility] at [facility] are committed to providing excellent care. You may receive a survey asking for feedback about your experience at our office. We value you your feedback and appreciate you taking the time to to fill it out. The [facility] leadership team is also available to discuss your experience in person, feel free to contact us [phone].

## 2023-09-23 ENCOUNTER — Other Ambulatory Visit: Payer: Self-pay | Admitting: Family Medicine

## 2023-09-23 DIAGNOSIS — M109 Gout, unspecified: Secondary | ICD-10-CM

## 2023-09-23 DIAGNOSIS — G90522 Complex regional pain syndrome I of left lower limb: Secondary | ICD-10-CM

## 2023-09-24 NOTE — Telephone Encounter (Signed)
 Probenecid last filled:  06/23/23, #90 Gabapentin last filled:  06/23/23, #360 Last OV:  11/16/22, pre-op eval Next OV:  none

## 2023-10-07 ENCOUNTER — Ambulatory Visit (INDEPENDENT_AMBULATORY_CARE_PROVIDER_SITE_OTHER): Payer: PPO

## 2023-10-07 VITALS — Ht 67.0 in | Wt 225.0 lb

## 2023-10-07 DIAGNOSIS — Z0000 Encounter for general adult medical examination without abnormal findings: Secondary | ICD-10-CM

## 2023-10-07 NOTE — Progress Notes (Signed)
 Please attest and cosign this visit due to patients primary care provider not being in the office at the time the visit was completed.    Subjective:   Randy Kennedy is a 73 y.o. who presents for a Medicare Wellness preventive visit.  Visit Complete: Virtual I connected with  Randy Kennedy on 10/07/23 by a audio enabled telemedicine application and verified that I am speaking with the correct person using two identifiers.  Patient Location: Home  Provider Location: Home Office  I discussed the limitations of evaluation and management by telemedicine. The patient expressed understanding and agreed to proceed.  Vital Signs: Because this visit was a virtual/telehealth visit, some criteria may be missing or patient reported. Any vitals not documented were not able to be obtained and vitals that have been documented are patient reported.  VideoDeclined- This patient declined Librarian, academic. Therefore the visit was completed with audio only.  Persons Participating in Visit: Patient.  AWV Questionnaire: Yes: Patient Medicare AWV questionnaire was completed by the patient on 10/04/23; I have confirmed that all information answered by patient is correct and no changes since this date.  Cardiac Risk Factors include: advanced age (>18men, >36 women);hypertension;male gender;obesity (BMI >30kg/m2);sedentary lifestyle;dyslipidemia     Objective:    Today's Vitals   10/04/23 1138 10/07/23 1132  Weight:  225 lb (102.1 kg)  Height:  5\' 7"  (1.702 m)  PainSc: 8     Body mass index is 35.24 kg/m.     10/07/2023   11:44 AM 11/27/2022   11:57 AM 11/18/2022   11:12 AM 10/05/2022   10:47 AM 02/26/2022    5:00 AM 10/02/2021    1:22 PM 12/17/2020    8:36 AM  Advanced Directives  Does Patient Have a Medical Advance Directive? Yes Yes Yes Yes Yes Yes Yes  Type of Estate agent of [place];Living will Healthcare Power of [place];Living will   Healthcare Power of [place];Living will Living will Healthcare Power of [place];Living will   Does patient want to make changes to medical advance directive?  No - Patient declined   No - Patient declined Yes (MAU/Ambulatory/Procedural Areas - Information given)   Copy of Healthcare Power of Attorney in Chart? Yes - validated most recent copy scanned in chart (See row information) No - copy requested  No - copy requested       Current Medications (verified) Outpatient Encounter Medications as of 10/07/2023  Medication Sig   aspirin EC 81 MG tablet Take 81 mg by mouth daily.   carvedilol (COREG) 25 MG tablet Take 1 tablet (25 mg total) by mouth 2 (two) times daily with a meal.   colchicine 0.6 MG tablet TAKE ONE TABLET BY MOUTH ONCE DAILY   fexofenadine (ALLEGRA) 180 MG tablet Take 180 mg by mouth every morning.   gabapentin (NEURONTIN) 300 MG capsule TAKE 1 CAPSULE BY MOUTH TWICE A DAY AND 2 CAPSULES AT BEDTIME   hydrALAZINE (APRESOLINE) 100 MG tablet Take 1 tablet (100 mg total) by mouth 3 (three) times daily.   minoxidil (LONITEN) 2.5 MG tablet TAKE 1 TABLET BY MOUTH EVERYDAY AT BEDTIME   montelukast (SINGULAIR) 10 MG tablet TAKE 1 TABLET BY MOUTH EVERYDAY AT BEDTIME   nitroGLYCERIN (NITROSTAT) 0.4 MG SL tablet Place 1 tablet (0.4 mg total) under the tongue every 5 (five) minutes as needed for chest pain.   pravastatin (PRAVACHOL) 40 MG tablet Take 1 tablet (40 mg total) by mouth daily.   probenecid (BENEMID)  500 MG tablet TAKE 1/2 TABLET TWICE A DAY BY MOUTH   valsartan (DIOVAN) 320 MG tablet Take 1 tablet (320 mg total) by mouth daily.   vitamin C (ASCORBIC ACID) 500 MG tablet Take 500 mg by mouth daily.   predniSONE (DELTASONE) 20 MG tablet Take two tablets daily for 4 days followed by one tablet daily for 4 days (Patient not taking: Reported on 10/07/2023)   No facility-administered encounter medications on file as of 10/07/2023.    Allergies (verified) Amlodipine, Allopurinol,  Lisinopril-hydrochlorothiazide, Procardia [nifedipine], Spironolactone, Clonidine derivatives, Doxazosin mesylate, Latex, Nortriptyline, and Uloric [febuxostat]   History: Past Medical History:  Diagnosis Date   Allergic rhinitis    Arthritis    Bradycardia    Chest pain    a. 11/2011 normal stress test performed in [place] - see chart.   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 moderate, AHI 37.4 increased to 80 in REM, on BiPAP 14/10, 97% compliance >4 hrs (06/2013); b. Does not tolerate CPAP.   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 Treated with vicodin, prior tried fentanyl and butrans.   Sinus node dysfunction (HCC)     a.) s/p Abbott Assurity MRI dual-chamber pacemaker placement 02/27/2022   Spondylolisthesis of lumbar region    Systolic murmur    Thrombocytopenia (HCC)    Wears dentures    Partial lower   Wears hearing aid in both ears    Does not wear all the time   Past Surgical History:  Procedure Laterality Date   APPLICATION OF INTRAOPERATIVE CT SCAN N/A 11/27/2022   Procedure: APPLICATION OF INTRAOPERATIVE CT SCAN;  Surgeon: Venetia Night, MD;  Location: [facility];  Service: Neurosurgery;  Laterality: N/A;   CATARACT EXTRACTION W/PHACO Right 11/26/2020   Procedure: CATARACT EXTRACTION PHACO AND INTRAOCULAR LENS PLACEMENT (IOC) RIGHT VIVITY TORIC LENS 7.17 00:49.6;  Surgeon: Galen Manila, MD;  Location: [facility];  Service: Ophthalmology;  Laterality: Right;  Latex Sleep  Apnea   CATARACT EXTRACTION W/PHACO Left 12/10/2020   Procedure: CATARACT EXTRACTION PHACO AND INTRAOCULAR LENS PLACEMENT (IOC) LEFT VIVITY;  Surgeon: Galen Manila, MD;  Location: [facility];  Service: Ophthalmology;  Laterality: Left;  Latex Sleep apnea 5.37 00:32.7   COLONOSCOPY  02/2011   ext hem, benign polyp, rpt 5 yrs ([place])   COLONOSCOPY WITH PROPOFOL N/A 12/17/2020   multiple TA, ext hem, rpt 3 yrs ([place], Loel Dubonnet, MD)   LEG SURGERY  x5   left - after fall at work (3.5 stories)   MANDIBLE SURGERY  1965   jaw fracture - horse kick   MOHS SURGERY Right 07/03/2021   Ear   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   Procedure: PACEMAKER IMPLANT;  Surgeon: Regan Lemming, MD;  [facility]: [facility];  Service: Cardiovascular;  Laterality: N/A;   TRANSFORAMINAL LUMBAR INTERBODY FUSION W/ MIS 1 LEVEL N/A 11/27/2022   Procedure: L4-5 MINIMALLY INVASIVE (MIS) TRANSFORAMINAL LUMBAR INTERBODY FUSION (TLIF);  Surgeon: Venetia Night, MD;  Location: [facility];  Service: Neurosurgery;  Laterality: N/A;   TYMPANIC MEMBRANE REPAIR Left 1994   otosclerosis   Family History  Problem Relation  Age of Onset   Cancer Mother        rectal   Hypertension Mother    Cancer Father 26       prostate   Cancer Paternal Uncle        prostate   Diabetes Neg Hx    Stroke Neg Hx    CAD Neg Hx    Social History   Socioeconomic History   Marital status: Married    Spouse name: Not on file   Number of children: Not on file   Years of education: Not on file   Highest education level: Master's degree (e.g., MA, MS, MEng, MEd, MSW, MBA)  Occupational History   Not on file  Tobacco Use   Smoking status: Former    Current packs/day: 0.00    Average packs/day: 1 pack/day for 4.0 years (4.0 ttl pk-yrs)    Types: Pipe, Cigarettes    Start date: 07/27/1974    Quit date: 07/27/1978    Years since quitting: 45.2   Smokeless tobacco: Never  Vaping Use   Vaping status: Never Used  Substance and Sexual Activity   Alcohol use: No    Alcohol/week: 0.0 standard drinks of alcohol   Drug use: No   Sexual activity: Not on file  Other Topics Concern   Not on file  Social History Narrative   Caffeine: 2 cans coke/day   Lives with wife and grown son    Occupation: retired, was Administrator, arts.   On disability for chronic pain   Edu: MBA   Activity: no regular exercise   Deit: good water, fruits/vegetables daily   Social Drivers of Corporate investment banker Strain: Low Risk  (10/07/2023)   Overall Financial Resource Strain (CARDIA)    Difficulty of Paying Living Expenses: Not hard at all  Food Insecurity: No Food Insecurity (10/07/2023)   Hunger Vital Sign    Worried About Running Out of Food in the Last Year: Never true    Ran Out of Food in the Last Year: Never true  Transportation Needs: No Transportation Needs (10/07/2023)   PRAPARE - Administrator, Civil Service (Medical): No    Lack of Transportation (Non-Medical): No  Physical Activity: Inactive (10/07/2023)   Exercise Vital Sign    Days of Exercise per  Week: 0 days    Minutes of Exercise per Session: 0 min   Stress: No Stress Concern Present (10/07/2023)   [employer] of Occupational Health - Occupational Stress Questionnaire    Feeling of Stress : Not at all  Social Connections: Unknown (10/07/2023)   Social Connection and Isolation Panel [NHANES]    Frequency of Communication with Friends and Family: More than three times a week    Frequency of Social Gatherings with Friends and Family: Twice a week    Attends Religious Services: Patient declined    Database administrator or Organizations: No    Attends Engineer, structural: Never    Marital Status: Married    Tobacco Counseling Counseling given: Not Answered    Clinical Intake:  Pre-visit preparation completed: Yes  Pain : 0-10 Pain Score: 8  Pain Type: Chronic pain Pain Location: Back (shoulder) Pain Orientation: Lower Pain Descriptors / Indicators: Aching, Pressure Pain Onset: More than a month ago Pain Frequency: Constant Pain Relieving Factors: rest off feet;IBU  Pain Relieving Factors: rest off feet;IBU  BMI - recorded: 35.24 Nutritional Status: BMI > 30  Obese Nutritional Risks: None Diabetes: No  How often do you need to have someone help you when you read instructions, pamphlets, or other written materials from your doctor or pharmacy?: 1 - Never  Interpreter Needed?: No  Comments: lives with wife Information entered by :: B.Jordon Kristiansen,LPN   Activities of Daily Living     10/04/2023   11:38 AM 11/27/2022    8:30 PM  In your present state of health, do you have any difficulty performing the following activities:  Hearing? 1 0  Vision? 0 0  Difficulty concentrating or making decisions? 0 0  Walking or climbing stairs? 1 1  Dressing or bathing? 0 0  Doing errands, shopping? 0 1  Preparing Food and eating ? N   Using the Toilet? N   In the past six months, have you accidently leaked urine? N   Do you have problems with loss of bowel control? N   Managing your Medications? N   Managing your  Finances? N   Housekeeping or managing your Housekeeping? N     Patient Care Team: Eustaquio Boyden, MD as PCP - General (Family Medicine) Regan Lemming, MD as PCP - Electrophysiology (Cardiology) Vilinda Flake, [facility] (Inactive) as Pharmacist (Pharmacist) Antonieta Iba, MD as Consulting Physician (Cardiology) Pa, [facility] (Optometry)  Indicate any recent Medical Services you may have received from other than [facility] providers in the past year (date may be approximate).     Assessment:   This is a routine wellness examination for [place].  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     Patient Stated   Not on track    10/07/23-Would like to lose weight      Patient Stated   Not on track    10/07/23-Be painfree.       Depression Screen     10/07/2023   11:40 AM 06/14/2023   12:19 PM 11/16/2022    9:45 AM 10/05/2022  10:46 AM 10/02/2021    1:24 PM 10/28/2020    9:50 AM 10/01/2020    1:10 PM  PHQ 2/9 Scores  PHQ - 2 Score 0 0 0 0 0 0 0  PHQ- 9 Score  0 5   5 0    Fall Risk     10/04/2023   11:38 AM 06/14/2023   12:19 PM 11/16/2022    9:45 AM 10/05/2022   10:37 AM 10/05/2022    3:25 AM  Fall Risk   Falls in the past year? 0 1 1 1 1   Number falls in past yr: 0 0 1 1 1   Comment    trips in yard, now uses cane when outside   Injury with Fall? 0 0 0 0 0  Risk for fall due to : No Fall Risks Other (Comment)  Impaired balance/gait   Risk for fall due to: Comment    Left leg problem   Follow up Education provided;Falls prevention discussed Falls evaluation completed  Falls evaluation completed;Education provided;Falls prevention discussed     MEDICARE  RISK AT HOME:  Medicare Risk at Home Any stairs in or around the home?: (Patient-Rptd) Yes If so, are there any without handrails?: (Patient-Rptd) No Home free of loose throw rugs in walkways, pet beds, electrical cords, etc?: (Patient-Rptd) Yes Adequate lighting in your home to reduce risk of falls?: (Patient-Rptd) Yes Life alert?: (Patient-Rptd) No Use of a cane, walker or w/c?: (Patient-Rptd) Yes Grab bars in the bathroom?: (Patient-Rptd) No Shower chair or bench in shower?: (Patient-Rptd) Yes Elevated toilet seat or a handicapped toilet?: (Patient-Rptd) No  TIMED UP AND GO:  Was the test performed?  No  Cognitive Function: 6CIT completed    10/01/2020    1:13 PM 08/23/2019    9:05 AM 07/01/2018    9:23 AM  MMSE - Mini Mental State Exam  Not completed: Refused    Orientation to time  5 5  Orientation to Place  5 5  Registration  3 3  Attention/ Calculation  5 0  Recall  3 1  Recall-comments   unable to recall 2 of 3 words  Language- name 2 objects   0  Language- repeat  1 1  Language- follow 3 step command   3  Language- read & follow direction   0  Write a sentence   0  Copy design   0  Total score   18        10/07/2023   11:46 AM 10/05/2022   10:48 AM  6CIT Screen  What Year? 0 points 0 points  What month? 0 points 0 points  What time? 0 points 0 points  Count back from 20 0 points 0 points  Months in reverse 0 points 0 points  Repeat phrase 0 points 0 points  Total Score 0 points 0 points    Immunizations Immunization History  Administered Date(s) Administered   Fluad Quad(high Dose 65+) 04/06/2019   Influenza, Seasonal, Injecte, Preservative Fre 08/02/2012, 04/26/2013   Influenza,inj,Quad PF,6+ Mos 04/30/2014, 08/14/2015, 03/24/2016, 05/19/2017, 04/20/2018   Influenza-Unspecified 04/30/2014, 08/14/2015, 03/24/2016, 05/19/2017, 04/20/2018   Janssen (J&J) SARS-COV-2 Vaccination 11/01/2019   Pneumococcal Conjugate-13 01/07/2017   Pneumococcal  Polysaccharide-23 07/01/2018   Td 07/27/2012   Zoster, Live 04/26/2013    Screening Tests Health Maintenance  Topic Date Due   Zoster Vaccines- Shingrix (1 of 2) 12/04/2000   COVID-19 Vaccine (2 - 2024-25 season) 03/28/2023   Colonoscopy  12/18/2023   INFLUENZA VACCINE  10/25/2023 (Originally 02/25/2023)   Medicare Annual Wellness (AWV)  10/06/2024   Pneumonia Vaccine 36+ Years old  Completed   Hepatitis C Screening  Completed   HPV VACCINES  Aged Out   DTaP/Tdap/Td  Discontinued    Health Maintenance  Health Maintenance Due  Topic Date Due   Zoster Vaccines- Shingrix (1 of 2) 12/04/2000   COVID-19 Vaccine (2 - 2024-25 season) 03/28/2023   Colonoscopy  12/18/2023   Health Maintenance Items Addressed: none addressed  Additional Screening:  Vision Screening: Recommended annual ophthalmology exams for early detection of glaucoma and other disorders of the eye.  Dental Screening: Recommended annual dental exams for proper oral hygiene  Community Resource Referral / Chronic Care Management: CRR required this visit?  No   CCM required this visit?  No    Plan:     I have personally reviewed and noted the following in the patient's chart:   Medical and social history Use of alcohol, tobacco or illicit drugs  Current medications and supplements including opioid prescriptions. Patient is not currently taking opioid prescriptions. Functional ability and status Nutritional status Physical activity Advanced directives List of other physicians Hospitalizations, surgeries, and ER visits in previous 12 months Vitals Screenings to include cognitive, depression, and falls Referrals and appointments  In addition, I have reviewed and discussed with patient certain preventive protocols, quality metrics, and best practice recommendations. A written personalized care plan for preventive services as well as general preventive health recommendations were provided to patient.     Sue Lush, LPN   5/62/1308   After Visit Summary: (MyChart) Due to this being a telephonic visit, the after visit summary with patients personalized plan was offered to patient via MyChart   Notes:  Pt relays he was off the spironolactone but started taking again as  his BP was climbing. He relays Dr Lavell Islam rx'd it and he will reach out to his office to notify and reorder.

## 2023-10-07 NOTE — Patient Instructions (Signed)
 Randy Kennedy , Thank you for taking time to come for your Medicare Wellness Visit. I appreciate your ongoing commitment to your health goals. Please review the following plan we discussed and let me know if I can assist you in the future.   Referrals/Orders/Follow-Ups/Clinician Recommendations: none  This is a list of the screening recommended for you and due dates:  Health Maintenance  Topic Date Due   Zoster (Shingles) Vaccine (1 of 2) 12/04/2000   COVID-19 Vaccine (2 - 2024-25 season) 03/28/2023   Colon Cancer Screening  12/18/2023   Flu Shot  10/25/2023*   Medicare Annual Wellness Visit  10/06/2024   Pneumonia Vaccine  Completed   Hepatitis C Screening  Completed   HPV Vaccine  Aged Out   DTaP/Tdap/Td vaccine  Discontinued  *Topic was postponed. The date shown is not the original due date.    Advanced directives: (In Chart) A copy of your advanced directives are scanned into your chart should your provider ever need it.  Next Medicare Annual Wellness Visit scheduled for next year: Yes 10/09/24 @ 1pm televisit

## 2023-10-13 NOTE — Addendum Note (Signed)
 Addended by: Elease Etienne A on: 10/13/2023 10:02 AM   Modules accepted: Orders

## 2023-10-13 NOTE — Progress Notes (Signed)
 Remote pacemaker transmission.

## 2023-10-25 ENCOUNTER — Ambulatory Visit (INDEPENDENT_AMBULATORY_CARE_PROVIDER_SITE_OTHER): Admitting: Family Medicine

## 2023-10-25 ENCOUNTER — Encounter (HOSPITAL_BASED_OUTPATIENT_CLINIC_OR_DEPARTMENT_OTHER): Payer: Self-pay | Admitting: Cardiovascular Disease

## 2023-10-25 ENCOUNTER — Encounter: Payer: Self-pay | Admitting: Family Medicine

## 2023-10-25 ENCOUNTER — Ambulatory Visit (INDEPENDENT_AMBULATORY_CARE_PROVIDER_SITE_OTHER)
Admission: RE | Admit: 2023-10-25 | Discharge: 2023-10-25 | Disposition: A | Discharge status: Home / Self Care | Source: Ambulatory Visit | Attending: Family Medicine | Admitting: Family Medicine

## 2023-10-25 VITALS — BP 170/94 | HR 60 | Temp 98.0°F | Ht 67.0 in | Wt 236.2 lb

## 2023-10-25 DIAGNOSIS — G8929 Other chronic pain: Secondary | ICD-10-CM

## 2023-10-25 DIAGNOSIS — M5441 Lumbago with sciatica, right side: Secondary | ICD-10-CM

## 2023-10-25 DIAGNOSIS — R1032 Left lower quadrant pain: Secondary | ICD-10-CM

## 2023-10-25 DIAGNOSIS — M5442 Lumbago with sciatica, left side: Secondary | ICD-10-CM | POA: Diagnosis not present

## 2023-10-25 DIAGNOSIS — L84 Corns and callosities: Secondary | ICD-10-CM | POA: Diagnosis not present

## 2023-10-25 DIAGNOSIS — R1030 Lower abdominal pain, unspecified: Secondary | ICD-10-CM | POA: Diagnosis not present

## 2023-10-25 DIAGNOSIS — I10 Essential (primary) hypertension: Secondary | ICD-10-CM

## 2023-10-25 DIAGNOSIS — R361 Hematospermia: Secondary | ICD-10-CM

## 2023-10-25 LAB — BASIC METABOLIC PANEL WITH GFR
BUN: 24 mg/dL — ABNORMAL HIGH (ref 6–23)
CO2: 28 meq/L (ref 19–32)
Calcium: 9.3 mg/dL (ref 8.4–10.5)
Chloride: 102 meq/L (ref 96–112)
Creatinine, Ser: 1.19 mg/dL (ref 0.40–1.50)
GFR: 60.86 mL/min (ref >60.00)
Glucose, Bld: 106 mg/dL — ABNORMAL HIGH (ref 70–99)
Potassium: 4.2 meq/L (ref 3.5–5.1)
Sodium: 139 meq/L (ref 135–145)

## 2023-10-25 LAB — POC URINALSYSI DIPSTICK (AUTOMATED)
Bilirubin, UA: NEGATIVE
Blood, UA: NEGATIVE
Glucose, UA: NEGATIVE
Ketones, UA: NEGATIVE
Leukocytes, UA: NEGATIVE
Nitrite, UA: NEGATIVE
Protein, UA: NEGATIVE
Spec Grav, UA: 1.01 (ref 1.010–1.025)
Urobilinogen, UA: 0.2 U/dL
pH, UA: 6 (ref 5.0–8.0)

## 2023-10-25 LAB — CBC WITH DIFFERENTIAL/PLATELET
Basophils Absolute: 0.1 10*3/uL (ref 0.0–0.1)
Basophils Relative: 1.1 % (ref 0.0–3.0)
Eosinophils Absolute: 0.3 10*3/uL (ref 0.0–0.7)
Eosinophils Relative: 3.8 % (ref 0.0–5.0)
HCT: 41.3 % (ref 39.0–52.0)
Hemoglobin: 14.1 g/dL (ref 13.0–17.0)
Lymphocytes Relative: 30.5 % (ref 12.0–46.0)
Lymphs Abs: 2.4 10*3/uL (ref 0.7–4.0)
MCHC: 34.2 g/dL (ref 30.0–36.0)
MCV: 89.9 fl (ref 78.0–100.0)
Monocytes Absolute: 0.8 10*3/uL (ref 0.1–1.0)
Monocytes Relative: 10.8 % (ref 3.0–12.0)
Neutro Abs: 4.2 10*3/uL (ref 1.4–7.7)
Neutrophils Relative %: 53.8 % (ref 43.0–77.0)
Platelets: 174 10*3/uL (ref 150.0–400.0)
RBC: 4.6 Mil/uL (ref 4.22–5.81)
RDW: 13.2 % (ref 11.5–15.5)
WBC: 7.7 10*3/uL (ref 4.0–10.5)

## 2023-10-25 LAB — PSA: PSA: 0.44 ng/mL (ref 0.10–4.00)

## 2023-10-25 MED ORDER — SPIRONOLACTONE 25 MG PO TABS
25.0000 mg | ORAL_TABLET | Freq: Every day | ORAL | 3 refills | Status: DC
Start: 2023-10-25 — End: 2024-06-21

## 2023-10-25 NOTE — Patient Instructions (Addendum)
 Labs today (PSA) Xray today  We will refer you to urology for further evaluation of persistent blood in sperm

## 2023-10-25 NOTE — Progress Notes (Signed)
 Ph: [phone] Fax: [phone]   Patient ID: Randy Kennedy, male    DOB: 08/08/1950, 73 y.o.   MRN: [id number]  This visit was conducted in person.  BP (!) 170/94 (BP Location: Right Arm, Cuff Size: Large)   Pulse 60   Temp 98 F (36.7 C) (Oral)   Ht 5\' 7"  (1.702 m)   Wt 236 lb 4 oz (107.2 kg)   SpO2 96%   BMI 37.00 kg/m   BP Readings from Last 3 Encounters:  10/25/23 (!) 170/94  09/20/23 138/86  08/30/23 120/72    CC: left lower back pain  Subjective:   HPI: JACOBI Kennedy is a 73 y.o. male presenting on 10/25/2023 for Back Pain (C/o L side low back pain and blood in sperm. Concerned due to fmhx of prostate CA. )   Last seen 10/2022.  Subsequently had L4/5 TLIF/PSF 11/2022 Myer Haff). Has had ongoing lower back and neck pain - planned cervical and lumbar MRI (11/18/2023) for further evaluation. Pending EMG/NCS tomorrow.   Notes severe L lower back pain without radiculopathy - also notes L groin pain described as constant ache - at times both of these pains are debilitating.   Sleeps in chair for the past 5 yrs because of sleep apnea - intolerant of CPAP mask.   Frequently gets calluses to left foot - requests podiatry referral for evaluation of this.   Also noted hematospermia for the past 9 months.  No dysuria, discharge, testicular or scrotal changes, new rash, abd pain, fevers/chills.  Fmhx prostate cancer - father and uncle.   HTN - traditionally well controlled. Continues spironolactone 25mg  daily along with valsartan 320mg  daily, carvedilol 25mg  bid , hydralazine 100mg  tid. Trouble sleeping last night.   Gout flare treated over phone 04/2023. Did not schedule f/u appt. No further gout flares. He continues probenecid 500mg  1/2 tab bid     Relevant past medical, surgical, family and social history reviewed and updated as indicated. Interim medical history since our last visit reviewed. Allergies and medications reviewed and updated. Outpatient  Medications Prior to Visit  Medication Sig Dispense Refill   aspirin EC 81 MG tablet Take 81 mg by mouth daily.     carvedilol (COREG) 25 MG tablet Take 1 tablet (25 mg total) by mouth 2 (two) times daily with a meal. 180 tablet 3   colchicine 0.6 MG tablet TAKE ONE TABLET BY MOUTH ONCE DAILY 90 tablet 3   fexofenadine (ALLEGRA) 180 MG tablet Take 180 mg by mouth every morning.     gabapentin (NEURONTIN) 300 MG capsule TAKE 1 CAPSULE BY MOUTH TWICE A DAY AND 2 CAPSULES AT BEDTIME 360 capsule 1   hydrALAZINE (APRESOLINE) 100 MG tablet Take 1 tablet (100 mg total) by mouth 3 (three) times daily. 270 tablet 3   minoxidil (LONITEN) 2.5 MG tablet TAKE 1 TABLET BY MOUTH EVERYDAY AT BEDTIME 90 tablet 0   montelukast (SINGULAIR) 10 MG tablet TAKE 1 TABLET BY MOUTH EVERYDAY AT BEDTIME 90 tablet 2   nitroGLYCERIN (NITROSTAT) 0.4 MG SL tablet Place 1 tablet (0.4 mg total) under the tongue every 5 (five) minutes as needed for chest pain. 25 tablet 4   pravastatin (PRAVACHOL) 40 MG tablet Take 1 tablet (40 mg total) by mouth daily. 90 tablet 3   predniSONE (DELTASONE) 20 MG tablet Take two tablets daily for 4 days followed by one tablet daily for 4 days 12 tablet 0   probenecid (BENEMID) 500 MG tablet TAKE 1/2 TABLET  TWICE A DAY BY MOUTH 90 tablet 1   spironolactone (ALDACTONE) 25 MG tablet Take 1 tablet (25 mg total) by mouth daily. 90 tablet 3   valsartan (DIOVAN) 320 MG tablet Take 1 tablet (320 mg total) by mouth daily. 90 tablet 3   vitamin C (ASCORBIC ACID) 500 MG tablet Take 500 mg by mouth daily.     No facility-administered medications prior to visit.     Per HPI unless specifically indicated in ROS section below Review of Systems  Objective:  BP (!) 170/94 (BP Location: Right Arm, Cuff Size: Large)   Pulse 60   Temp 98 F (36.7 C) (Oral)   Ht 5\' 7"  (1.702 m)   Wt 236 lb 4 oz (107.2 kg)   SpO2 96%   BMI 37.00 kg/m   Wt Readings from Last 3 Encounters:  10/25/23 236 lb 4 oz (107.2 kg)   10/07/23 225 lb (102.1 kg)  09/20/23 238 lb (108 kg)      Physical Exam Vitals and nursing note reviewed.  Constitutional:      Appearance: Normal appearance. He is not ill-appearing.  HENT:     Mouth/Throat:     Mouth: Mucous membranes are moist.     Pharynx: Oropharynx is clear. No oropharyngeal exudate or posterior oropharyngeal erythema.  Eyes:     Extraocular Movements: Extraocular movements intact.     Pupils: Pupils are equal, round, and reactive to light.  Cardiovascular:     Rate and Rhythm: Normal rate and regular rhythm.     Pulses: Normal pulses.     Heart sounds: Normal heart sounds. No murmur heard. Pulmonary:     Effort: Pulmonary effort is normal. No respiratory distress.     Breath sounds: Normal breath sounds. No wheezing, rhonchi or rales.  Musculoskeletal:        General: Swelling (nonpitting) present.     Right lower leg: No edema.     Left lower leg: No edema.     Comments:  No significant pain midline spine No paraspinous mm tenderness Neg seated SLR bilaterally. No reproducible pain with int/ext rotation at hip  Skin:    General: Skin is warm and dry.     Findings: No rash.     Comments:  Calluses throughout L sole  Maceration between 2nd/3rd toes on left  Neurological:     Mental Status: He is alert.  Psychiatric:        Mood and Affect: Mood normal.        Behavior: Behavior normal.       Results for orders placed or performed in visit on 10/25/23  POCT Urinalysis Dipstick (Automated)   Collection Time: 10/25/23 10:30 AM  Result Value Ref Range   Color, UA yellow    Clarity, UA clear    Glucose, UA Negative Negative   Bilirubin, UA negative    Ketones, UA negative    Spec Grav, UA 1.010 1.010 - 1.025   Blood, UA negative    pH, UA 6.0 5.0 - 8.0   Protein, UA Negative Negative   Urobilinogen, UA 0.2 0.2 or 1.0 E.U./dL   Nitrite, UA negative    Leukocytes, UA Negative Negative    Assessment & Plan:   Problem List Items  Addressed This Visit     HTN (hypertension)   Chronic, deteriorated  However recent home and other office readings largely well controlled ?due to poor night's rest last night. No med changes done today - continue 4 drug  regimen.  I did provide him with home BP log sheet to keep track of blood pressures, to let us or cardiology know if persistently elevated readings.      Chronic bilateral low back pain with bilateral sciatica   Current pain localized to left lower back/buttock with radiation to groin, no radiculopathy.  Persists despite back surgery 11/2022.  Pending neurosurgery workup (MRI lumbar, cervical spine later next month as well as NCS/EMS tomorrow). Will eval for hip arthritis with xrays today.       Chronic pain of left groin - Primary   Check hip xrays to eval hip arthritis burden.       Relevant Orders   DG HIP UNILAT W OR W/O PELVIS 2-3 VIEWS LEFT   Basic metabolic panel with GFR   CBC with Differential/Platelet   Hematospermia   Longstanding for 9+months  Check PSA Refer to urology       Relevant Orders   POCT Urinalysis Dipstick (Automated) (Completed)   PSA   Ambulatory referral to Urology   Callus of foot   Arthritis limits mobility and ability to perform foot care.  Podiatry referral placed      Relevant Orders   Ambulatory referral to Podiatry     No orders of the defined types were placed in this encounter.   Orders Placed This Encounter  Procedures   DG HIP UNILAT W OR W/O PELVIS 2-3 VIEWS LEFT    Standing Status:   Future    Expiration Date:   04/25/2024    Reason for Exam (SYMPTOM  OR DIAGNOSIS REQUIRED):   L groin pain for months    Preferred imaging location?:   [facility]   PSA   Basic metabolic panel with GFR   CBC with Differential/Platelet   Ambulatory referral to Urology    Referral Priority:   Routine    Referral Type:   Consultation    Referral Reason:   Specialty Services Required    Requested Specialty:    Urology    Number of Visits Requested:   1   Ambulatory referral to Podiatry    Referral Priority:   Routine    Referral Type:   Consultation    Referral Reason:   Specialty Services Required    Requested Specialty:   Podiatry    Number of Visits Requested:   1   POCT Urinalysis Dipstick (Automated)    Patient Instructions  Labs today (PSA) Xray today  We will refer you to urology for further evaluation of persistent blood in sperm   Follow up plan: No follow-ups on file.  Eustaquio Boyden, MD

## 2023-10-25 NOTE — Assessment & Plan Note (Signed)
 Longstanding for 9+months  Check PSA Refer to urology

## 2023-10-25 NOTE — Assessment & Plan Note (Signed)
 Check hip xrays to eval hip arthritis burden.

## 2023-10-25 NOTE — Assessment & Plan Note (Signed)
 Current pain localized to left lower back/buttock with radiation to groin, no radiculopathy.  Persists despite back surgery 11/2022.  Pending neurosurgery workup (MRI lumbar, cervical spine later next month as well as NCS/EMS tomorrow). Will eval for hip arthritis with xrays today.

## 2023-10-25 NOTE — Assessment & Plan Note (Signed)
 Chronic, deteriorated  However recent home and other office readings largely well controlled ?due to poor night's rest last night. No med changes done today - continue 4 drug regimen.  I did provide him with home BP log sheet to keep track of blood pressures, to let us or cardiology know if persistently elevated readings.

## 2023-10-25 NOTE — Assessment & Plan Note (Signed)
 Arthritis limits mobility and ability to perform foot care.  Podiatry referral placed

## 2023-10-26 ENCOUNTER — Ambulatory Visit: Admitting: Neurology

## 2023-10-29 ENCOUNTER — Encounter: Payer: Self-pay | Admitting: Family Medicine

## 2023-11-01 ENCOUNTER — Encounter: Payer: Self-pay | Admitting: Family Medicine

## 2023-11-10 ENCOUNTER — Encounter: Payer: Self-pay | Admitting: Podiatry

## 2023-11-10 ENCOUNTER — Ambulatory Visit: Admitting: Podiatry

## 2023-11-10 VITALS — Ht 67.0 in | Wt 236.2 lb

## 2023-11-10 DIAGNOSIS — L600 Ingrowing nail: Secondary | ICD-10-CM | POA: Diagnosis not present

## 2023-11-10 DIAGNOSIS — M7742 Metatarsalgia, left foot: Secondary | ICD-10-CM | POA: Diagnosis not present

## 2023-11-10 DIAGNOSIS — M62462 Contracture of muscle, left lower leg: Secondary | ICD-10-CM

## 2023-11-10 DIAGNOSIS — M7741 Metatarsalgia, right foot: Secondary | ICD-10-CM

## 2023-11-10 DIAGNOSIS — M62461 Contracture of muscle, right lower leg: Secondary | ICD-10-CM | POA: Diagnosis not present

## 2023-11-10 DIAGNOSIS — L84 Corns and callosities: Secondary | ICD-10-CM

## 2023-11-10 NOTE — Progress Notes (Signed)
 Subjective:  Patient ID: Randy Kennedy, male    DOB: Nov 18, 1950,  MRN: [id number]  Chief Complaint  Patient presents with   Callouses    Pt is here due to callus on the bottom of his left foot, pt states it is painful to walk on.    Discussed the use of AI scribe software for clinical note transcription with the patient, who gave verbal consent to proceed.  History of Present Illness Randy Kennedy is a 73 year old male who presents with painful calluses and an ingrown toenail.  He has been experiencing issues with his foot for several years, primarily due to his gait, which places pressure on the front of his foot. His ankle has limited motion and does not bend well, leading to the development of painful calluses. He describes the sensation as 'standing on a rock.' He has undergone five different operations on his ankle in the past. Despite these surgeries, he has not had an ankle fusion.  He has an ingrown toenail on his left foot for about three months, which is painful at the corner. The toenail is thick and tends to break off when trimmed.  No medical issues such as diabetes or a history of blood clots in the leg. No numbness, tingling, or burning sensations in his foot.  He has tried various treatments for his calluses, including using a Dremel tool and acid products from the store, but finds that the calluses return quickly. He wears memory foam shoes and changes them frequently to manage the pressure on his foot.      Objective:    Physical Exam VASCULAR: DP and PT pulse palpable. Foot is warm and well-perfused. Capillary fill time is brisk. 2+ pedal pulses. DERMATOLOGIC: Normal skin turgor, texture, and temperature. No open lesions, rashes, or ulcerations. Diffuse hyperkeratosis on plantar forefoot. Punctate corn formation under 1st, 2nd, 3rd, and 4th metatarsal heads.  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 NEUROLOGIC: Normal sensation to light touch and pressure. No  paresthesias on examination. ORTHOPEDIC: Smooth pain-free range of motion of all examined joints. No ecchymosis or bruising. No gross deformity. No pain to palpation. Incurvated lateral hallux border without paronychia. Multiple surgical scars around ankle with limited range of motion.   No images are attached to the encounter.    Results Procedure: Toenail Clipping Description: The ingrown toenail was debrided in slant back fashion to alleviate the offending border.  Procedure: Callus Trimming Description: The callus on the plantar forefoot was trimmed. The callus was due to limited ankle motion and increased pressure.   Assessment:   1. Metatarsalgia of both feet   2. Gastrocnemius equinus of left lower extremity   3. Gastrocnemius equinus of right lower extremity   4. Callus of foot   5. Ingrowing left great toenail      Plan:  Patient was evaluated and treated and all questions answered.  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 Exacerbated by gait, which increases forefoot pressure. Multiple ankle surgeries contribute to limited range of motion. - Recommend custom molded orthotic inserts to redistribute pressure and accommodate pressure points. Educate on potential cost and possibility of partial insurance coverage. Expected lifespan: 3-5 years. - Advise daily use of 40% urea cream to soften calluses, with pumice stone for mechanical debridement. - Discuss option of regular callus trimming, though not covered by insurance  unless specific conditions are met.  Ingrown toenail Chronic ingrown toenail for approximately three months, causing localized pain at the lateral hallux border without paronychia. Toenail is thick and prone to breaking when trimmed. - Trim the ingrown  toenail to alleviate discomfort. - Recommend soaking the foot in Epsom salt and warm water  to reduce sharpness and discomfort. - Discuss option of procedure to remove sides of toenail and cauterize to prevent recurrence if issue persists.      Return if symptoms worsen or fail to improve.

## 2023-11-10 NOTE — Patient Instructions (Signed)
 VISIT SUMMARY:  Today, we discussed the issues you are experiencing with painful calluses and an ingrown toenail. We reviewed your history of foot problems, including multiple ankle surgeries, and developed a plan to help manage your symptoms and improve your comfort.  YOUR PLAN:  -PLANTAR HYPERKERATOSIS WITH PUNCTATE CORN FORMATION: This condition involves thickened skin and small, painful corns on the bottom of your foot due to limited ankle motion and pressure from your gait. We recommend custom molded orthotic inserts to help redistribute pressure on your foot. These inserts may cost around $490 and could last 3-5 years, with potential partial insurance coverage. Additionally, use 40% urea cream daily to soften the calluses and a pumice stone for mechanical debridement. Regular callus trimming is also an option, though it may not be covered by insurance.  -INGROWN TOENAIL: An ingrown toenail occurs when the edge of the toenail grows into the skin, causing pain. We trimmed your toenail to alleviate discomfort and recommend soaking your foot in Epsom salt and warm water to reduce sharpness and discomfort. If the problem persists, we can consider a procedure to remove the sides of the toenail and cauterize the area to prevent recurrence.  INSTRUCTIONS:  Please follow up with us  if your symptoms do not improve or if you have any concerns. Consider the custom orthotic inserts and daily use of 40% urea cream for your calluses. For your ingrown toenail, continue soaking your foot and monitor for any persistent issues.

## 2023-11-11 ENCOUNTER — Other Ambulatory Visit: Payer: Self-pay

## 2023-11-11 DIAGNOSIS — R202 Paresthesia of skin: Secondary | ICD-10-CM

## 2023-11-13 ENCOUNTER — Other Ambulatory Visit: Payer: Self-pay | Admitting: Family Medicine

## 2023-11-13 ENCOUNTER — Other Ambulatory Visit (HOSPITAL_BASED_OUTPATIENT_CLINIC_OR_DEPARTMENT_OTHER): Payer: Self-pay | Admitting: Cardiovascular Disease

## 2023-11-13 DIAGNOSIS — J3089 Other allergic rhinitis: Secondary | ICD-10-CM

## 2023-11-17 MED ORDER — FLUTICASONE PROPIONATE 50 MCG/ACT NA SUSP: 2.0000 | Freq: Every day | NASAL | 1 refills | Status: DC

## 2023-11-17 NOTE — Telephone Encounter (Signed)
 Lvmtcb, sent mychart message

## 2023-11-17 NOTE — Telephone Encounter (Signed)
 Noted.

## 2023-11-17 NOTE — Telephone Encounter (Signed)
 E-scribed refill.  Please schedule CPE and fasting labs (no food/drink- except water  and/or blk coffee 5 hrs prior) for additional refills.

## 2023-11-17 NOTE — CV Procedure (Signed)
  Device system confirmed to be MRI conditional, with implant date > 6 weeks ago, and no evidence of abandoned or epicardial leads in review of most recent CXR  Device last cleared by EP Provider: Creighton Doffing  Clearance is good through for 1 year as long as parameters remain stable at time of check. If pt undergoes a cardiac device procedure during that time, they should be re-cleared.   Tachy-therapies to be programmed off if applicable with device back to pre-MRI settings after completion of exam.  [employer] will be present for programming for the MRI.   Arlys Lamer, RT  11/17/2023 11:55 AM

## 2023-11-17 NOTE — Telephone Encounter (Signed)
 Patient scheduled.

## 2023-11-18 ENCOUNTER — Ambulatory Visit (HOSPITAL_COMMUNITY)
Admission: RE | Admit: 2023-11-18 | Discharge: 2023-11-18 | Disposition: A | Discharge status: Home / Self Care | Source: Ambulatory Visit | Attending: Orthopedic Surgery | Admitting: Orthopedic Surgery

## 2023-11-18 DIAGNOSIS — R202 Paresthesia of skin: Secondary | ICD-10-CM | POA: Diagnosis not present

## 2023-11-18 DIAGNOSIS — M5412 Radiculopathy, cervical region: Secondary | ICD-10-CM

## 2023-11-18 DIAGNOSIS — Z981 Arthrodesis status: Secondary | ICD-10-CM | POA: Diagnosis not present

## 2023-11-18 DIAGNOSIS — R200 Anesthesia of skin: Secondary | ICD-10-CM | POA: Diagnosis not present

## 2023-11-18 DIAGNOSIS — M4802 Spinal stenosis, cervical region: Secondary | ICD-10-CM | POA: Diagnosis not present

## 2023-11-18 DIAGNOSIS — M47816 Spondylosis without myelopathy or radiculopathy, lumbar region: Secondary | ICD-10-CM | POA: Diagnosis not present

## 2023-11-18 DIAGNOSIS — M5416 Radiculopathy, lumbar region: Secondary | ICD-10-CM | POA: Insufficient documentation

## 2023-11-18 DIAGNOSIS — M542 Cervicalgia: Secondary | ICD-10-CM | POA: Diagnosis not present

## 2023-11-18 DIAGNOSIS — M47812 Spondylosis without myelopathy or radiculopathy, cervical region: Secondary | ICD-10-CM | POA: Diagnosis not present

## 2023-11-18 DIAGNOSIS — G8929 Other chronic pain: Secondary | ICD-10-CM | POA: Diagnosis not present

## 2023-11-18 DIAGNOSIS — M5136 Other intervertebral disc degeneration, lumbar region with discogenic back pain only: Secondary | ICD-10-CM | POA: Diagnosis not present

## 2023-11-18 DIAGNOSIS — M5137 Other intervertebral disc degeneration, lumbosacral region with discogenic back pain only: Secondary | ICD-10-CM | POA: Diagnosis not present

## 2023-11-18 DIAGNOSIS — M48061 Spinal stenosis, lumbar region without neurogenic claudication: Secondary | ICD-10-CM | POA: Diagnosis not present

## 2023-11-18 NOTE — Telephone Encounter (Signed)
Plz phone in due to E prescribing error.  

## 2023-11-19 ENCOUNTER — Ambulatory Visit: Admitting: Neurology

## 2023-11-19 DIAGNOSIS — G5603 Carpal tunnel syndrome, bilateral upper limbs: Secondary | ICD-10-CM

## 2023-11-19 DIAGNOSIS — R202 Paresthesia of skin: Secondary | ICD-10-CM | POA: Diagnosis not present

## 2023-11-19 NOTE — Procedures (Signed)
 [facility]  [address] [place], [address]  [place], [place] [address] Tel: [phone] Fax: [phone] Test Date:  11/19/2023  Patient: Randy Kennedy DOB: 1950/09/02 Physician: Reyna Cava, DO  Sex: Male Height: 5\' 7"  Ref Phys: [name], PA-C  ID#: [id number]   Technician:    History: This is a 73 year old male referred for evaluation of numbness and tingling involving both hands.  NCV & EMG Findings: Extensive electrodiagnostic testing of the right upper extremity and additional studies of the left shows:  Bilateral median sensory responses show prolonged latency (R5.8, L6.2 ms) and reduced amplitude (R5.5, L4.1 V).  Bilateral ulnar sensory responses are within normal limits. Bilateral median motor responses show prolonged latency (R5.1, L5.8 ms).  Bilateral ulnar motor responses are within normal limits. Chronic motor axonal loss changes are seen affecting bilateral abductor pollicis brevis muscles, without accompanying active denervation.  Impression: Bilateral median neuropathy at or distal to the wrist, consistent with a clinical diagnosis of carpal tunnel syndrome.  Overall, these findings are moderate-to-severe in degree electrically.   ___________________________ Reyna Cava, DO    Nerve Conduction Studies   Stim Site NR Peak (ms) Norm Peak (ms) O-P Amp (V) Norm O-P Amp  Left Median Anti Sensory (2nd Digit)  32 C  Wrist    *6.2 <3.8 *4.1 >10  Right Median Anti Sensory (2nd Digit)  32 C  Wrist    *5.8 <3.8 *5.5 >10  Left Ulnar Anti Sensory (5th Digit)  32 C  Wrist    3.0 <3.2 7.0 >5  Right Ulnar Anti Sensory (5th Digit)  32 C  Wrist    2.8 <3.2 7.1 >5     Stim Site NR Onset (ms) Norm Onset (ms) O-P Amp (mV) Norm O-P Amp Site1 Site2 Delta-0 (ms) Dist (cm) Vel (m/s) Norm Vel (m/s)  Left Median Motor (Abd Poll Brev)  32 C  Wrist    *5.8 <4.0 5.6 >5 Elbow Wrist 6.2 33.0 53 >50  Elbow    12.0  5.1         Right Median Motor (Abd Poll Brev)   32 C  Wrist    *5.1 <4.0 6.1 >5 Elbow Wrist 6.4 32.0 50 >50  Elbow    11.5  5.7         Left Ulnar Motor (Abd Dig Minimi)  32 C  Wrist    2.7 <3.1 9.2 >7 B Elbow Wrist 4.0 23.0 58 >50  B Elbow    6.7  8.8  A Elbow B Elbow 1.9 10.0 53 >50  A Elbow    8.6  8.7         Right Ulnar Motor (Abd Dig Minimi)  32 C  Wrist    2.6 <3.1 9.2 >7 B Elbow Wrist 4.5 26.0 58 >50  B Elbow    7.1  8.1  A Elbow B Elbow 1.7 10.0 59 >50  A Elbow    8.8  7.8          Electromyography   Side Muscle Ins.Act Fibs Fasc Recrt Amp Dur Poly Activation Comment  Right 1stDorInt Nml Nml Nml Nml Nml Nml Nml Nml N/A  Right Abd Poll Brev Nml Nml Nml *1- *1+ *1+ *1+ Nml N/A  Right PronatorTeres Nml Nml Nml Nml Nml Nml Nml Nml N/A  Right Biceps Nml Nml Nml Nml Nml Nml Nml Nml N/A  Right Triceps Nml Nml Nml Nml Nml Nml Nml Nml N/A  Right Deltoid Nml Nml Nml Nml Nml  Nml Nml Nml N/A  Left 1stDorInt Nml Nml Nml Nml Nml Nml Nml Nml N/A  Left Abd Poll Brev Nml Nml Nml *1- *1+ *1+ *1+ Nml N/A  Left PronatorTeres Nml Nml Nml Nml Nml Nml Nml Nml N/A  Left Biceps Nml Nml Nml Nml Nml Nml Nml Nml N/A  Left Triceps Nml Nml Nml Nml Nml Nml Nml Nml N/A  Left Deltoid Nml Nml Nml Nml Nml Nml Nml Nml N/A      Waveforms:

## 2023-11-19 NOTE — Telephone Encounter (Addendum)
Noted.  Refill left on vm at pharmacy.

## 2023-11-23 ENCOUNTER — Telehealth: Payer: Self-pay | Admitting: Orthopedic Surgery

## 2023-11-23 NOTE — Telephone Encounter (Signed)
-----   Message from Crofton B sent at 11/23/2023  2:01 PM EDT ----- Patient called our office back and would like to wait until everything comes back and come in person to review. ----- Message ----- From: [name], PA-C Sent: 11/21/2023   8:06 PM EDT To: [facility]  His cervical and lumbar MRI results are not yet back. His EMG results are back.   Does he want to do phone visit to review EMG results or just wait to review everything in person once MRI results are back? Either is fine with me.   Thanks.

## 2023-11-25 DIAGNOSIS — H02411 Mechanical ptosis of right eyelid: Secondary | ICD-10-CM | POA: Diagnosis not present

## 2023-11-25 DIAGNOSIS — H02831 Dermatochalasis of right upper eyelid: Secondary | ICD-10-CM | POA: Diagnosis not present

## 2023-12-09 ENCOUNTER — Ambulatory Visit (INDEPENDENT_AMBULATORY_CARE_PROVIDER_SITE_OTHER): Payer: PPO

## 2023-12-09 ENCOUNTER — Telehealth: Payer: Self-pay | Admitting: Orthopedic Surgery

## 2023-12-09 DIAGNOSIS — I495 Sick sinus syndrome: Secondary | ICD-10-CM

## 2023-12-09 LAB — CUP PACEART REMOTE DEVICE CHECK
Battery Remaining Longevity: 106 mo
Battery Remaining Percentage: 86 %
Battery Voltage: 3.01 V
Brady Statistic AP VP Percent: 1.9 %
Brady Statistic AP VS Percent: 98 %
Brady Statistic AS VP Percent: 1 %
Brady Statistic AS VS Percent: 1 %
Brady Statistic RA Percent Paced: 99 %
Brady Statistic RV Percent Paced: 1.9 %
Date Time Interrogation Session: 20250515020034
Implantable Lead Connection Status: 753985
Implantable Lead Connection Status: 753985
Implantable Lead Implant Date: 20230804
Implantable Lead Implant Date: 20230804
Implantable Lead Location: 753859
Implantable Lead Location: 753860
Implantable Pulse Generator Implant Date: 20230804
Lead Channel Impedance Value: 460 Ohm
Lead Channel Impedance Value: 560 Ohm
Lead Channel Pacing Threshold Amplitude: 0.625 V
Lead Channel Pacing Threshold Amplitude: 0.875 V
Lead Channel Pacing Threshold Pulse Width: 0.5 ms
Lead Channel Pacing Threshold Pulse Width: 0.5 ms
Lead Channel Sensing Intrinsic Amplitude: 12 mV
Lead Channel Sensing Intrinsic Amplitude: 2.5 mV
Lead Channel Setting Pacing Amplitude: 1.125
Lead Channel Setting Pacing Amplitude: 1.625
Lead Channel Setting Pacing Pulse Width: 0.5 ms
Lead Channel Setting Sensing Sensitivity: 2 mV
Pulse Gen Model: 2272
Pulse Gen Serial Number: 8101269

## 2023-12-09 NOTE — Telephone Encounter (Signed)
 He's had his EMG and MRIs of cervical and lumbar spine.   He was supposed to get thoracic xrays as well and did not get them.   Please let him know to get thoracic xrays and we can review everything once I get those results.   Thanks!

## 2023-12-10 ENCOUNTER — Ambulatory Visit
Admission: RE | Admit: 2023-12-10 | Discharge: 2023-12-10 | Disposition: A | Discharge status: Home / Self Care | Attending: Orthopedic Surgery | Admitting: Orthopedic Surgery

## 2023-12-10 ENCOUNTER — Ambulatory Visit
Admission: RE | Admit: 2023-12-10 | Discharge: 2023-12-10 | Disposition: A | Discharge status: Home / Self Care | Source: Ambulatory Visit | Attending: Orthopedic Surgery | Admitting: Orthopedic Surgery

## 2023-12-10 ENCOUNTER — Other Ambulatory Visit: Payer: Self-pay | Admitting: Family Medicine

## 2023-12-10 DIAGNOSIS — G8929 Other chronic pain: Secondary | ICD-10-CM | POA: Diagnosis not present

## 2023-12-10 DIAGNOSIS — J3089 Other allergic rhinitis: Secondary | ICD-10-CM

## 2023-12-10 DIAGNOSIS — M546 Pain in thoracic spine: Secondary | ICD-10-CM | POA: Insufficient documentation

## 2023-12-10 DIAGNOSIS — M47814 Spondylosis without myelopathy or radiculopathy, thoracic region: Secondary | ICD-10-CM | POA: Diagnosis not present

## 2023-12-10 DIAGNOSIS — M419 Scoliosis, unspecified: Secondary | ICD-10-CM | POA: Diagnosis not present

## 2023-12-10 NOTE — Telephone Encounter (Signed)
 Left message to call back

## 2023-12-10 NOTE — Progress Notes (Addendum)
 Referring Physician:  Claire Crick, MD [address] [place],  [place] [address]  Primary Physician:  Claire Crick, MD  History of Present Illness: 12/10/2023 [name] has a history of HTN, OSA, GERD, RSD right leg, thrombocytopenia, dyslipidemia, TLS, gilbert disease, gout, narcolepsy, obesity.   He has a pacemaker.   He is s/p L4-L5 TLIF/PSF on 11/27/22 with Dr. Mont Antis.   Last seen by me on 09/20/23 for neck pain with numbness/tingling in both hands and LBP and left groin pain.   Cervical MRI from 2022 showed cervical spondylosis with multilevel foraminal stenosis.   Previous lumbar xrays 02/16/23 showed no complications.   Cervical and lumbar MRIs were ordered along with thoracic xrays and upper extremity EMG. He is here to review the results.   He is about the same.   He continues with constant left scapular pain with intermittent neck pain. He has constant numbness/tingling in both hands. He notes weakness as well. Having trouble holding his cane due to numbness. No specific aggravating or alleviating factors.   Also with some constant left sided LBP that radiates to left groin. No right leg pain. No numbness, tingling in his legs. He notes weakness in both legs. Pain is better with sitting against a pillow. Standing and walking make him worse.  He does not think he ever had much improvement after surgery.   He is taking neurontin .   He quit smoking in 1980.   Bowel/Bladder Dysfunction: none  Conservative measures:  Physical therapy: none since surgery  Multimodal medical therapy including regular antiinflammatories: neurontin   Injections: No epidural steroid injections since his surgery  Past Surgery:   L4-L5 TLIF/PSF on 11/27/22 with Dr. Dicky Fountain has no symptoms of cervical myelopathy.  The symptoms are causing a significant impact on the patient's life.   Review of Systems:  A 10 point review of systems is  negative, except for the pertinent positives and negatives detailed in the HPI.  Past Medical History: Past Medical History:  Diagnosis Date   Allergic rhinitis    Arthritis    Bradycardia    Chest pain    a. 11/2011 normal stress test performed in [place]  - see chart.   Chronic fatigue    Chronic low back pain with bilateral sciatica    Chronic pain    L ankle and bilateral hips (remote fracture s/p surgeries), lower back (told has herniated disc)   Closed avulsion fracture of right talus 06/2016   COVID-19 06/2020   Diastolic dysfunction 02/26/2022   a.) TTE 02/26/2022: EF 60-65%, mod RVE, mod LAE, sev RAE, AoV sclerosis, G2DD   Dyspnea    Eczema    Erectile dysfunction 09/14/2012   Oletta Berry disease    Gout 2007   Hearing loss    otosclerosis   Heartburn    mild, controlled with pepcid    History of chicken pox    History of hepatitis B    as child (80 years of age), no sequelae   HLD (hyperlipidemia)    HTN (hypertension)    difficult to control - clonidine  and beta blockers caused bradycardia   Leg edema    Lobar pneumonia (HCC) 05/13/2022   Morbid obesity (HCC)    Motion sickness    Most moving vehicles   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 moderate, AHI 37.4 increased to 80 in REM,  on BiPAP 14/10, 97% compliance >4 hrs (06/2013); b. Does not tolerate CPAP.   Pneumonia    Presence of cardiac pacemaker 02/27/2022   a.) [employer] Assurity MRI dual-chamber pacemaker (SN: [id number] )   RBBB    RLS (restless legs syndrome)    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   Reviewed prior workup (saw pain management at [facility], [place]): chronic back pain with RLS + RSD L leg with severe L post-traumatic ankle joint arthosis, no mention of peripheral neuropathy. Treated with vicodin, prior tried fentanyl  and butrans.   Sinus node dysfunction (HCC)    a.) s/p Abbott Assurity MRI dual-chamber pacemaker placement  02/27/2022   Spondylolisthesis of lumbar region    Systolic murmur    Thrombocytopenia (HCC)    Wears dentures    Partial lower   Wears hearing aid in both ears    Does not wear all the time    Past Surgical History: Past Surgical History:  Procedure Laterality Date   APPLICATION OF INTRAOPERATIVE CT SCAN N/A 11/27/2022   Procedure: APPLICATION OF INTRAOPERATIVE CT SCAN;  Surgeon: Jodeen Munch, MD;  Location: [facility];  Service: Neurosurgery;  Laterality: N/A;   CATARACT EXTRACTION W/PHACO Right 11/26/2020   Procedure: CATARACT EXTRACTION PHACO AND INTRAOCULAR LENS PLACEMENT (IOC) RIGHT VIVITY TORIC LENS 7.17 00:49.6;  Surgeon: Clair Crews, MD;  Location: [facility];  Service: Ophthalmology;  Laterality: Right;  Latex Sleep Apnea   CATARACT EXTRACTION W/PHACO Left 12/10/2020   Procedure: CATARACT EXTRACTION PHACO AND INTRAOCULAR LENS PLACEMENT (IOC) LEFT VIVITY;  Surgeon: Clair Crews, MD;  Location: [facility];  Service: Ophthalmology;  Laterality: Left;  Latex Sleep apnea 5.37 00:32.7   COLONOSCOPY  02/2011   ext hem, benign polyp, rpt 5 yrs ([place] )   COLONOSCOPY WITH PROPOFOL  N/A 12/17/2020   multiple TA, ext hem, rpt 3 yrs (Vanga, Elson Halon, MD)   LEG SURGERY  x5   left - after fall at work (3.5 stories)   MANDIBLE SURGERY  1965   jaw fracture - horse kick   MOHS SURGERY Right 07/03/2021   Ear   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   Procedure: PACEMAKER IMPLANT;  Surgeon: Lei Pump, MD;  Location: [facility];  Service: Cardiovascular;  Laterality: N/A;   TRANSFORAMINAL LUMBAR INTERBODY FUSION W/ MIS 1 LEVEL N/A 11/27/2022   Procedure: L4-5 MINIMALLY INVASIVE (MIS) TRANSFORAMINAL LUMBAR INTERBODY FUSION (TLIF);  Surgeon: Jodeen Munch, MD;  Location: [facility];  Service: Neurosurgery;  Laterality: N/A;   TYMPANIC MEMBRANE REPAIR Left 1994   otosclerosis    Allergies: Allergies as of 12/13/2023 - Review Complete 11/10/2023   Allergen Reaction Noted   Amlodipine  Rash 06/24/2021   Allopurinol Other (See Comments) 09/13/2012   Lisinopril -hydrochlorothiazide   02/16/2013   Procardia  [nifedipine ] Other (See Comments) 07/12/2013   Clonidine  derivatives Rash 02/25/2022   Doxazosin  mesylate Rash 10/07/2017   Latex Rash 11/14/2020   Nortriptyline  Rash 07/01/2018   Uloric  [febuxostat ] Rash 05/07/2017    Medications: Outpatient Encounter Medications as of 12/13/2023  Medication Sig   aspirin  EC 81 MG tablet Take 81 mg by mouth daily.   carvedilol  (COREG ) 25 MG tablet Take 1 tablet (25 mg total) by mouth 2 (two) times daily with a meal.   colchicine  0.6 MG tablet TAKE ONE TABLET BY MOUTH ONCE DAILY   fexofenadine (ALLEGRA) 180 MG tablet Take 180 mg by mouth every morning.   fluticasone  (FLONASE ) 50 MCG/ACT nasal spray SPRAY 2 SPRAYS INTO EACH NOSTRIL EVERY DAY  gabapentin  (NEURONTIN ) 300 MG capsule TAKE 1 CAPSULE BY MOUTH TWICE A DAY AND 2 CAPSULES AT BEDTIME   hydrALAZINE  (APRESOLINE ) 100 MG tablet Take 1 tablet (100 mg total) by mouth 3 (three) times daily.   minoxidil  (LONITEN ) 2.5 MG tablet TAKE 1 TABLET BY MOUTH DAILY AT BEDTIME   montelukast  (SINGULAIR ) 10 MG tablet TAKE 1 TABLET BY MOUTH EVERYDAY AT BEDTIME   nitroGLYCERIN  (NITROSTAT ) 0.4 MG SL tablet Place 1 tablet (0.4 mg total) under the tongue every 5 (five) minutes as needed for chest pain.   pravastatin  (PRAVACHOL ) 40 MG tablet Take 1 tablet (40 mg total) by mouth daily.   predniSONE  (DELTASONE ) 20 MG tablet Take two tablets daily for 4 days followed by one tablet daily for 4 days   probenecid  (BENEMID ) 500 MG tablet TAKE 1/2 TABLET TWICE A DAY BY MOUTH   spironolactone  (ALDACTONE ) 25 MG tablet Take 1 tablet (25 mg total) by mouth daily.   valsartan  (DIOVAN ) 320 MG tablet Take 1 tablet (320 mg total) by mouth daily.   vitamin C (ASCORBIC ACID) 500 MG tablet Take 500 mg by mouth daily.   No facility-administered encounter medications on file as of  12/13/2023.    Social History: Social History   Tobacco Use   Smoking status: Former    Current packs/day: 0.00    Average packs/day: 1 pack/day for 4.0 years (4.0 ttl pk-yrs)    Types: Pipe, Cigarettes    Start date: 07/27/1974    Quit date: 07/27/1978    Years since quitting: 45.4   Smokeless tobacco: Never  Vaping Use   Vaping status: Never Used  Substance Use Topics   Alcohol use: No    Alcohol/week: 0.0 standard drinks of alcohol   Drug use: No    Family Medical History: Family History  Problem Relation Age of Onset   Cancer Mother        rectal   Hypertension Mother    Cancer Father 78       prostate   Cancer Paternal Uncle        prostate   Diabetes Neg Hx    Stroke Neg Hx    CAD Neg Hx     Physical Examination: There were no vitals filed for this visit.    Awake, alert, oriented to person, place, and time.  Speech is clear and fluent. Fund of knowledge is appropriate.   Cranial Nerves: Pupils equal round and reactive to light.  Facial tone is symmetric.     No abnormal lesions on exposed skin.   Strength: Side Biceps Triceps Deltoid Interossei Grip Wrist Ext. Wrist Flex.  R 5 5 5 5 5 5 5   L 5 5 5 5 5 5 5    Side Iliopsoas Quads Hamstring PF DF EHL  R 5 5 5 5 5 5   L 5 5 5 5 5 5    Reflexes are 2+ and symmetric at the biceps, brachioradialis, patella and achilles.  Hoffman's is absent.  Clonus is not present. Bilateral upper and lower extremity sensation is intact to light touch.     Gait is abnormal. He limps favoring left side. He uses a cane.   Medical Decision Making  Imaging: Cervical MRI dated 11/18/23:  FINDINGS: Alignment: Physiologic.   Vertebrae: Vertebral bodies demonstrate normal signal intensity. No acute fracture is identified.   Cord: Normal signal and morphology.   Posterior Fossa, vertebral arteries, paraspinal tissues: The visualized portions of the skull base and the posterior fossa are normal.  No soft tissue abnormality is  identified.   Disc levels:   C2-C3: The disk is normal in configuration. Mild bilateral facet arthropathy. Mild right uncovertebral joint disease. Mild right neuroforaminal stenosis. No spinal canal stenosis.   C3-C4: Disc osteophyte complex. Mild bilateral facet arthropathy. Moderate left uncovertebral joint disease. Moderate left neuroforaminal stenosis. Mild spinal canal stenosis.   C4-C5: Disc osteophyte complex. No facet arthropathy. Mild bilateral uncovertebral joint disease. Mild bilateral neuroforaminal stenosis. No spinal canal stenosis.   C5-C6: Disc osteophyte complex. No facet arthropathy. Moderate bilateral uncovertebral joint disease. Moderate bilateral neuroforaminal stenosis. Mild spinal canal stenosis.   C6-C7: Disc osteophyte complex. No facet arthropathy. Severe bilateral uncovertebral joint disease. Severe bilateral neuroforaminal stenosis. Mild spinal canal stenosis.   C7-T1: The disk is normal in configuration. Mild bilateral facet arthropathy. No uncovertebral joint disease. No neuroforaminal stenosis. No spinal canal stenosis.   IMPRESSION: 1. Mild canal stenosis at C3-C4, C5-C6 and C6-C7. 2. Severe bilateral foraminal stenosis at C6-C7. Moderate foraminal stenosis on the left at C3-C4 and bilaterally at C5-C6.     Electronically Signed   By: Johnanna Mylar M.D.   On: 12/09/2023 15:39   Thoracic xrays dated 12/10/23:  FINDINGS: There is no evidence of thoracic spine fracture. Scoliosis. Mild anterior spurring noted throughout the thoracic spine. No other significant bone abnormalities are identified. Heart size is enlarged. Cardiac pacemaker is noted.   IMPRESSION: Mild degenerative joint changes of thoracic spine.     Electronically Signed   By: Anna Barnes M.D.   On: 12/10/2023 13:06   Lumbar MRI dated 11/18/23:  FINDINGS: Segmentation: Standard.   Alignment:  Physiologic lumbar alignment is maintained.   Vertebrae: Operative  changes of anterior and posterior fusion at L4-L5. Degenerative endplate marrow changes at a few levels. Schmorl's nodes at several levels. No compression fractures.   Conus medullaris and cauda equina: The conus medullaris terminates at the level of L1-L2. The distal spinal cord signal intensity is normal.   Paraspinal and other soft tissues: The visualized abdomen and pelvis show no soft tissue abnormality. The visualized aorta is normal.   Disc levels:   L1-L2: Mild disc bulge. No facet arthropathy. No neuroforaminal stenosis. No spinal canal stenosis.   L2-L3: Disc bulge. Mild bilateral facet arthropathy. Mild right neuroforaminal stenosis. Mild spinal canal stenosis.   L3-L4: Disc bulge. Mild bilateral facet arthropathy. Mild bilateral neuroforaminal stenosis. Moderate to severe spinal canal stenosis.   L4-L5: Disc bulge with interbody fusion. Severe bilateral facet arthropathy. Moderate bilateral neuroforaminal stenosis. Moderate to severe spinal canal stenosis.   L5-S1: Disc bulge. Moderate bilateral facet arthropathy. No neuroforaminal stenosis. No spinal canal stenosis.   IMPRESSION: 1. Operative changes of anterior and posterior fusion at L4-L5. 2. Moderate to severe canal stenoses at L3-L4 and L4-L5 secondary to disc bulging and facet arthropathy. 3. Moderate foraminal stenoses bilaterally at L4-L5.     Electronically Signed   By: Johnanna Mylar M.D.   On: 12/09/2023 15:42      I have personally reviewed the images and agree with the above interpretation.   EMG of bilateral upper extremities dated 11/19/23:  NCV & EMG Findings: Extensive electrodiagnostic testing of the right upper extremity and additional studies of the left shows:  Bilateral median sensory responses show prolonged latency (R5.8, L6.2 ms) and reduced amplitude (R5.5, L4.1 V).  Bilateral ulnar sensory responses are within normal limits. Bilateral median motor responses show prolonged latency  (R5.1, L5.8 ms).  Bilateral ulnar motor responses are within  normal limits. Chronic motor axonal loss changes are seen affecting bilateral abductor pollicis brevis muscles, without accompanying active denervation.   Impression: Bilateral median neuropathy at or distal to the wrist, consistent with a clinical diagnosis of carpal tunnel syndrome.  Overall, these findings are moderate-to-severe in degree electrically.     ___________________________ Reyna Cava, DO  Assessment and Plan: Mr. Bulnes is s/p L4-L5 TLIF/PSF on 11/27/22 with Dr. Mont Antis.   He has constant left scapular pain with intermittent neck pain. He has constant numbness/tingling in both hands. He notes weakness as well.   He has known cervical spondylosis with mild central stenosis C3-C4 and C5-C7, multilevel foraminal stenosis, and severe bilateral foraminal stenosis C6-C7.   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   Thoracic xrays showed mild spondylosis.   Neck pain and left scapular pain is likely from cervical spondylosis. Numbness/tingling in hands is likely from CTS.   Also with constant left sided LBP that radiates to left groin. No right leg pain. No numbness, tingling in his legs. He notes weakness in both legs. He thinks his lower back is worse since his surgery.   He has known lumbar spondylosis with mild central and right foraminal stenosis L2-L3, mild bilateral foraminal stenosis L3-L4 with moderate central stenosis. Fusion at L4-L5 with moderate central stenosis and bilateral foraminal stenosis.   LBP likely due to underlying spondylosis, not sure of cause of left groin pain.  Treatment options discussed with patient and following plan made:   - Referral to Dr. Felipe Horton to discuss possible surgery options for bilateral carpal tunnel syndrome. Discussed injections and bracing, but symptoms are severe and constant.  - As above, neck and scapular pain likely cervical mediated.  - His  primary complaint is his LBP. Will review imaging with Dr. Mont Antis and message him with further plan.   I spent a total of 40 minutes in face-to-face and non-face-to-face activities related to this patient's care today including review of outside records, review of imaging, review of symptoms, physical exam, discussion of differential diagnosis, discussion of treatment options, and documentation.   ADDENDUM 12/14/23:  Reviewed with Dr. Mont Antis. Recommend CT of lumbar spine to assess fusion healing. If this looks good, will likely consider PT and/or injections. Message sent to patient.   [name] PA-C Dept. of Neurosurgery

## 2023-12-10 NOTE — Telephone Encounter (Signed)
 Appt 12/13/2023

## 2023-12-13 ENCOUNTER — Ambulatory Visit: Admitting: Orthopedic Surgery

## 2023-12-13 ENCOUNTER — Ambulatory Visit: Payer: Self-pay | Admitting: Cardiology

## 2023-12-13 ENCOUNTER — Encounter: Payer: Self-pay | Admitting: Orthopedic Surgery

## 2023-12-13 VITALS — BP 132/82 | Ht 67.0 in | Wt 238.0 lb

## 2023-12-13 DIAGNOSIS — Z981 Arthrodesis status: Secondary | ICD-10-CM

## 2023-12-13 DIAGNOSIS — M47814 Spondylosis without myelopathy or radiculopathy, thoracic region: Secondary | ICD-10-CM | POA: Diagnosis not present

## 2023-12-13 DIAGNOSIS — M4722 Other spondylosis with radiculopathy, cervical region: Secondary | ICD-10-CM

## 2023-12-13 DIAGNOSIS — M4726 Other spondylosis with radiculopathy, lumbar region: Secondary | ICD-10-CM

## 2023-12-13 DIAGNOSIS — M47812 Spondylosis without myelopathy or radiculopathy, cervical region: Secondary | ICD-10-CM

## 2023-12-13 DIAGNOSIS — G5603 Carpal tunnel syndrome, bilateral upper limbs: Secondary | ICD-10-CM | POA: Diagnosis not present

## 2023-12-13 DIAGNOSIS — M48061 Spinal stenosis, lumbar region without neurogenic claudication: Secondary | ICD-10-CM | POA: Diagnosis not present

## 2023-12-13 DIAGNOSIS — M5412 Radiculopathy, cervical region: Secondary | ICD-10-CM

## 2023-12-13 DIAGNOSIS — M5416 Radiculopathy, lumbar region: Secondary | ICD-10-CM

## 2023-12-13 DIAGNOSIS — M4802 Spinal stenosis, cervical region: Secondary | ICD-10-CM

## 2023-12-14 DIAGNOSIS — M48061 Spinal stenosis, lumbar region without neurogenic claudication: Secondary | ICD-10-CM

## 2023-12-14 DIAGNOSIS — Z981 Arthrodesis status: Secondary | ICD-10-CM

## 2023-12-14 DIAGNOSIS — M5416 Radiculopathy, lumbar region: Secondary | ICD-10-CM

## 2023-12-17 ENCOUNTER — Ambulatory Visit: Admitting: Urology

## 2023-12-24 ENCOUNTER — Ambulatory Visit
Admission: RE | Admit: 2023-12-24 | Discharge: 2023-12-24 | Disposition: A | Discharge status: Home / Self Care | Source: Ambulatory Visit | Attending: Orthopedic Surgery | Admitting: Orthopedic Surgery

## 2023-12-24 DIAGNOSIS — M48061 Spinal stenosis, lumbar region without neurogenic claudication: Secondary | ICD-10-CM | POA: Diagnosis not present

## 2023-12-24 DIAGNOSIS — Z981 Arthrodesis status: Secondary | ICD-10-CM | POA: Diagnosis not present

## 2023-12-24 DIAGNOSIS — Z4789 Encounter for other orthopedic aftercare: Secondary | ICD-10-CM | POA: Diagnosis not present

## 2023-12-24 DIAGNOSIS — M47816 Spondylosis without myelopathy or radiculopathy, lumbar region: Secondary | ICD-10-CM | POA: Diagnosis not present

## 2023-12-24 DIAGNOSIS — M5416 Radiculopathy, lumbar region: Secondary | ICD-10-CM | POA: Diagnosis not present

## 2023-12-24 DIAGNOSIS — M5135 Other intervertebral disc degeneration, thoracolumbar region: Secondary | ICD-10-CM | POA: Diagnosis not present

## 2023-12-25 ENCOUNTER — Other Ambulatory Visit: Payer: Self-pay | Admitting: Family Medicine

## 2023-12-25 DIAGNOSIS — J3089 Other allergic rhinitis: Secondary | ICD-10-CM

## 2023-12-31 NOTE — Progress Notes (Addendum)
 Referring Physician:  Claire Crick, MD [address] [place],  [place] [address]  Primary Physician:  Claire Crick, MD  History of Present Illness: Mr. Charlie Seda has a history of HTN, OSA, GERD, RSD right leg, thrombocytopenia, dyslipidemia, TLS, gilbert disease, gout, narcolepsy, obesity.   He has a pacemaker.   He is s/p L4-L5 TLIF/PSF on 11/27/22 with Dr. Mont Antis.   Last seen by me on 12/13/23 for follow up.    He has known cervical spondylosis with mild central stenosis C3-C4 and C5-C7, multilevel foraminal stenosis, and severe bilateral foraminal stenosis C6-C7.    EMG showed moderate to severe bilateral carpal tunnel syndrome- he is scheduled for left carpal tunnel release by Dr. Felipe Horton on 01/18/24.    He has known lumbar spondylosis with mild central and right foraminal stenosis L2-L3, mild bilateral foraminal stenosis L3-L4 with moderate central stenosis. Fusion at L4-L5 with moderate central stenosis and bilateral foraminal stenosis.   He is here to review his lumbar CT scan.   He continues with constant left sided LBP that radiates to left groin. LBP = left groin pain. No right leg pain. No numbness, tingling in his legs. He notes weakness in both legs. He has weakness in left leg. Groin pain is worse at night. LBP is worse with walking.   He is taking neurontin .   He quit smoking in 1980.   Bowel/Bladder Dysfunction: none  Conservative measures:  Physical therapy: none since surgery  Multimodal medical therapy including regular antiinflammatories: neurontin   Injections: No epidural steroid injections since his surgery  Past Surgery:   L4-L5 TLIF/PSF on 11/27/22 with Dr. Dicky Fountain has no symptoms of cervical myelopathy.  The symptoms are causing a significant impact on the patient's life.   Review of Systems:  A 10 point review of systems is negative, except for the pertinent positives and negatives detailed in the  HPI.  Past Medical History: Past Medical History:  Diagnosis Date   Allergic rhinitis    Arthritis    Bradycardia    Chest pain    a. 11/2011 normal stress test performed in [place]  - see chart.   Chronic fatigue    Chronic low back pain with bilateral sciatica    Chronic pain    L ankle and bilateral hips (remote fracture s/p surgeries), lower back (told has herniated disc)   Closed avulsion fracture of right talus 06/2016   COVID-19 06/2020   Diastolic dysfunction 02/26/2022   a.) TTE 02/26/2022: EF 60-65%, mod RVE, mod LAE, sev RAE, AoV sclerosis, G2DD   Dyspnea    Eczema    Erectile dysfunction 09/14/2012   Oletta Berry disease    Gout 2007   Hearing loss    otosclerosis   Heartburn    mild, controlled with pepcid    History of chicken pox    History of hepatitis B    as child (65 years of age), no sequelae   HLD (hyperlipidemia)    HTN (hypertension)    difficult to control - clonidine  and beta blockers caused bradycardia   Leg edema    Lobar pneumonia (HCC) 05/13/2022   Morbid obesity (HCC)    Motion sickness    Most moving vehicles   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 moderate, AHI 37.4 increased to 80 in REM, on BiPAP 14/10, 97% compliance >4 hrs (06/2013); b. Does not  tolerate CPAP.   Pneumonia    Presence of cardiac pacemaker 02/27/2022   a.) Abbott Assurity MRI dual-chamber pacemaker (SN: [id number] )   RBBB    RLS (restless legs syndrome)    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   Reviewed prior workup (saw pain management at [facility], [place]): chronic back pain with RLS + RSD L leg with severe L post-traumatic ankle joint arthosis, no mention of peripheral neuropathy. Treated with vicodin, prior tried fentanyl  and butrans.   Sinus node dysfunction (HCC)    a.) s/p Abbott Assurity MRI dual-chamber pacemaker placement 02/27/2022   Spondylolisthesis of lumbar region    Systolic murmur     Thrombocytopenia (HCC)    Wears dentures    Partial lower   Wears hearing aid in both ears    Does not wear all the time    Past Surgical History: Past Surgical History:  Procedure Laterality Date   APPLICATION OF INTRAOPERATIVE CT SCAN N/A 11/27/2022   Procedure: APPLICATION OF INTRAOPERATIVE CT SCAN;  Surgeon: Jodeen Munch, MD;  Location: [facility];  Service: Neurosurgery;  Laterality: N/A;   CATARACT EXTRACTION W/PHACO Right 11/26/2020   Procedure: CATARACT EXTRACTION PHACO AND INTRAOCULAR LENS PLACEMENT (IOC) RIGHT VIVITY TORIC LENS 7.17 00:49.6;  Surgeon: Clair Crews, MD;  Location: [facility];  Service: Ophthalmology;  Laterality: Right;  Latex Sleep Apnea   CATARACT EXTRACTION W/PHACO Left 12/10/2020   Procedure: CATARACT EXTRACTION PHACO AND INTRAOCULAR LENS PLACEMENT (IOC) LEFT VIVITY;  Surgeon: Clair Crews, MD;  Location: [facility];  Service: Ophthalmology;  Laterality: Left;  Latex Sleep apnea 5.37 00:32.7   COLONOSCOPY  02/2011   ext hem, benign polyp, rpt 5 yrs ([place] )   COLONOSCOPY WITH PROPOFOL  N/A 12/17/2020   multiple TA, ext hem, rpt 3 yrs (Vanga, Elson Halon, MD)   LEG SURGERY  x5   left - after fall at work (3.5 stories)   MANDIBLE SURGERY  1965   jaw fracture - horse kick   MOHS SURGERY Right 07/03/2021   Ear   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   Procedure: PACEMAKER IMPLANT;  Surgeon: Lei Pump, MD;  Location: [facility];  Service: Cardiovascular;  Laterality: N/A;   TRANSFORAMINAL LUMBAR INTERBODY FUSION W/ MIS 1 LEVEL N/A 11/27/2022   Procedure: L4-5 MINIMALLY INVASIVE (MIS) TRANSFORAMINAL LUMBAR INTERBODY FUSION (TLIF);  Surgeon: Jodeen Munch, MD;  Location: [facility];  Service: Neurosurgery;  Laterality: N/A;   TYMPANIC MEMBRANE REPAIR Left 1994   otosclerosis    Allergies: Allergies as of 01/05/2024 - Review Complete 01/05/2024  Allergen Reaction Noted   Amlodipine  Rash 06/24/2021   Allopurinol  Other (See Comments) 09/13/2012   Lisinopril -hydrochlorothiazide   02/16/2013   Procardia  [nifedipine ] Other (See Comments) 07/12/2013   Clonidine  derivatives Rash 02/25/2022   Doxazosin  mesylate Rash 10/07/2017   Latex Rash 11/14/2020   Nortriptyline  Rash 07/01/2018   Uloric  [febuxostat ] Rash 05/07/2017    Medications: Outpatient Encounter Medications as of 01/05/2024  Medication Sig   aspirin  EC 81 MG tablet Take 81 mg by mouth daily.   carvedilol  (COREG ) 25 MG tablet Take 1 tablet (25 mg total) by mouth 2 (two) times daily with a meal.   colchicine  0.6 MG tablet TAKE ONE TABLET BY MOUTH ONCE DAILY (Patient taking differently: Take 0.6 mg by mouth daily as needed (gout attacks).)   fexofenadine (ALLEGRA) 180 MG tablet Take 180 mg by mouth every morning.   fluticasone  (FLONASE ) 50 MCG/ACT nasal spray SPRAY 2 SPRAYS INTO EACH NOSTRIL EVERY  DAY   gabapentin  (NEURONTIN ) 300 MG capsule TAKE 1 CAPSULE BY MOUTH TWICE A DAY AND 2 CAPSULES AT BEDTIME   hydrALAZINE  (APRESOLINE ) 100 MG tablet Take 1 tablet (100 mg total) by mouth 3 (three) times daily.   minoxidil  (LONITEN ) 2.5 MG tablet TAKE 1 TABLET BY MOUTH DAILY AT BEDTIME   montelukast  (SINGULAIR ) 10 MG tablet TAKE 1 TABLET BY MOUTH EVERYDAY AT BEDTIME   nitroGLYCERIN  (NITROSTAT ) 0.4 MG SL tablet Place 1 tablet (0.4 mg total) under the tongue every 5 (five) minutes as needed for chest pain.   pravastatin  (PRAVACHOL ) 40 MG tablet Take 1 tablet (40 mg total) by mouth daily.   probenecid  (BENEMID ) 500 MG tablet TAKE 1/2 TABLET TWICE A DAY BY MOUTH   spironolactone  (ALDACTONE ) 25 MG tablet Take 1 tablet (25 mg total) by mouth daily.   valsartan  (DIOVAN ) 320 MG tablet Take 1 tablet (320 mg total) by mouth daily.   vitamin C (ASCORBIC ACID) 500 MG tablet Take 500 mg by mouth daily.   No facility-administered encounter medications on file as of 01/05/2024.    Social History: Social History   Tobacco Use   Smoking status: Former    Current  packs/day: 0.00    Average packs/day: 1 pack/day for 4.0 years (4.0 ttl pk-yrs)    Types: Pipe, Cigarettes    Start date: 07/27/1974    Quit date: 07/27/1978    Years since quitting: 45.4   Smokeless tobacco: Never  Vaping Use   Vaping status: Never Used  Substance Use Topics   Alcohol use: No    Alcohol/week: 0.0 standard drinks of alcohol   Drug use: No    Family Medical History: Family History  Problem Relation Age of Onset   Cancer Mother        rectal   Hypertension Mother    Cancer Father 25       prostate   Cancer Paternal Uncle        prostate   Diabetes Neg Hx    Stroke Neg Hx    CAD Neg Hx     Physical Examination: Vitals:   01/05/24 0833  BP: 132/82      Awake, alert, oriented to person, place, and time.  Speech is clear and fluent. Fund of knowledge is appropriate.   Cranial Nerves: Pupils equal round and reactive to light.  Facial tone is symmetric.     No abnormal lesions on exposed skin.   Strength: Side Iliopsoas Quads Hamstring PF DF EHL  R 5 5 5 5 5 5   L 5 5 5 5  4+ 4+   Reflexes are 2+ and symmetric at the patella and achilles on right, achilles not tested on left.   He states he has chronic weakness in left foot due to previous foot/ankle surgery.   No pain with IR/ER of both hips.   Clonus is not present.  Bilateral lower extremity sensation is intact to light touch.     Gait is abnormal. He limps favoring left side. He uses a cane.   Medical Decision Making  Imaging: CT of lumbar spine dated 12/24/23:  FINDINGS: Segmentation: Standard.   Alignment: 0.6 cm anterolisthesis L4 on L5 is unchanged compared to the prior MRI.   Vertebrae: No acute fracture or focal pathologic process. Scattered small Schmorl's nodes are most notable in the inferior endplates of L2 and L3.   Paraspinal and other soft tissues: Scattered aortic atherosclerotic calcifications are noted.   Disc levels: T12-L1: Minimal disc  bulge and mild facet  degenerative disease. No stenosis.   L1-2: Minimal disc bulge and small calcification of the annulus. No stenosis.   L2-3: Shallow disc bulge and mild-to-moderate facet degenerative disease, greater on the right. Mild central canal and right foraminal narrowing. The left foramen is open.   L3-4: Broad-based disc bulge and mild-to-moderate facet arthropathy. Dorsal epidural fat is somewhat prominent. There is moderate to moderately severe compression of the thecal sac by disc and epidural fat. The left foramen is open. Mild right foraminal narrowing noted.   L4-5: Status post left facetectomy, discectomy and fusion. Pedicle screws, stabilization bars and interbody spacer are in place. No evidence of hardware loosening or other complicating feature. Advanced facet degenerative change is present on the right. The central canal and foramina appear open.   L5-S1: There is some facet degenerative change. Minimal disc bulge with some calcification of the annulus. No stenosis.   IMPRESSION: 1. Status post left facetectomy, discectomy and fusion at L4-5. The central canal and foramina appear open at this level. Hardware is intact. No evidence of loosening or other complicating feature. 2. Moderate to moderately severe compression of the thecal sac at L3-4 by disc and epidural fat. Mild right foraminal narrowing also noted at this level. 3. Mild central canal and right foraminal narrowing at L2-3.   Aortic Atherosclerosis (ICD10-I70.0).     Electronically Signed   By: Etheleen Her M.D.   On: 01/04/2024 11:37    I have personally reviewed the images and agree with the above interpretation.  Assessment and Plan: Mr. Haen is s/p L4-L5 TLIF/PSF on 11/27/22 with Dr. Mont Antis.   He continues with constant left sided LBP that radiates to left groin. No right leg pain. No numbness, tingling in his legs. He notes weakness in left leg.   He has known lumbar spondylosis with mild  central and right foraminal stenosis L2-L3, mild bilateral foraminal stenosis L3-L4 with moderate central stenosis. Fusion at L4-L5 with moderate central stenosis and bilateral foraminal stenosis.   Above CT scan shows hardware in good position. Fusion appears to be healing.   4+/5 strength in left DF and EHL- he states is chronic from previous foot/ankle surgery. Previous notes show 5/5 strength.   Treatment options discussed with patient and following plan made:   - Will review CT with Dr. Mont Antis regarding fusion healing and message him with further plan.  - If fusion is healed, consider lumbar injections and/or PT.  - He should hear from me by Monday.   I spent a total of 20 minutes in face-to-face and non-face-to-face activities related to this patient's care today including review of outside records, review of imaging, review of symptoms, physical exam, discussion of differential diagnosis, discussion of treatment options, and documentation.   ADDENDUM 01/12/24:  Patient reviewed with Dr. Mont Antis. Fusion appears to be healed. He has some right sided stenosis and has moderate central stenosis at L3-L4, but this would not explain his left sided LBP and left groin pain. He recommends EMG of bilateral lower extremities for further evaluation.   Message sent to patient.   [name] PA-C Dept. of Neurosurgery

## 2023-12-31 NOTE — Progress Notes (Deleted)
 Referring Physician:  Claire Crick, MD [address] [place],  [place] [address]  Primary Physician:  Claire Crick, MD  History of Present Illness: 12/31/2023 [name] is here today with a chief complaint of ***     History of Present Illness: 12/10/2023 note from Randy Kennedy, [place] [name] has a history of HTN, OSA, GERD, RSD right leg, thrombocytopenia, dyslipidemia, TLS, gilbert disease, gout, narcolepsy, obesity.    He has a pacemaker.    He is s/p L4-L5 TLIF/PSF on 11/27/22 with Dr. Mont Antis.    Last seen by me on 09/20/23 for neck pain with numbness/tingling in both hands and LBP and left groin pain.    Cervical MRI from 2022 showed cervical spondylosis with multilevel foraminal stenosis.    Previous lumbar xrays 02/16/23 showed no complications.    Cervical and lumbar MRIs were ordered along with thoracic xrays and upper extremity EMG. He is here to review the results.    He is about the same.    He continues with constant left scapular pain with intermittent neck pain. He has constant numbness/tingling in both hands. He notes weakness as well. Having trouble holding his cane due to numbness. No specific aggravating or alleviating factors.    Also with some constant left sided LBP that radiates to left groin. No right leg pain. No numbness, tingling in his legs. He notes weakness in both legs. Pain is better with sitting against a pillow. Standing and walking make him worse.   He does not think he ever had much improvement after surgery.    He is taking neurontin .    He quit smoking in 1980.    Bowel/Bladder Dysfunction: none   Conservative measures:  Physical therapy: none since surgery  Multimodal medical therapy including regular antiinflammatories: neurontin   Injections: No epidural steroid injections since his surgery   Past Surgery:   L4-L5 TLIF/PSF on 11/27/22 with Dr. Dicky Fountain has no symptoms  of cervical myelopathy.   The symptoms are causing a significant impact on the patient's life.      Review of Systems:  A 10 point review of systems is negative, except for the pertinent positives and negatives detailed in the HPI.  Past Medical History: Past Medical History:  Diagnosis Date   Allergic rhinitis    Arthritis    Bradycardia    Chest pain    a. 11/2011 normal stress test performed in [place]  - see chart.   Chronic fatigue    Chronic low back pain with bilateral sciatica    Chronic pain    L ankle and bilateral hips (remote fracture s/p surgeries), lower back (told has herniated disc)   Closed avulsion fracture of right talus 06/2016   COVID-19 06/2020   Diastolic dysfunction 02/26/2022   a.) TTE 02/26/2022: EF 60-65%, mod RVE, mod LAE, sev RAE, AoV sclerosis, G2DD   Dyspnea    Eczema    Erectile dysfunction 09/14/2012   Oletta Berry disease    Gout 2007   Hearing loss    otosclerosis   Heartburn    mild, controlled with pepcid    History of chicken pox    History of hepatitis B    as child (64 years of age), no sequelae   HLD (hyperlipidemia)    HTN (hypertension)    difficult to control - clonidine  and beta blockers caused bradycardia   Leg edema    Lobar pneumonia (HCC) 05/13/2022   Morbid  obesity (HCC)    Motion sickness    Most moving vehicles   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 moderate, AHI 37.4 increased to 80 in REM, on BiPAP 14/10, 97% compliance >4 hrs (06/2013); b. Does not tolerate CPAP.   Pneumonia    Presence of cardiac pacemaker 02/27/2022   a.) Abbott Assurity MRI dual-chamber pacemaker (SN: [id number] )   RBBB    RLS (restless legs syndrome)    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   Reviewed prior workup (saw pain management at [facility], [place]): chronic back pain with RLS + RSD L leg with severe L post-traumatic ankle joint arthosis, no mention of peripheral neuropathy.  Treated with vicodin, prior tried fentanyl  and butrans.   Sinus node dysfunction (HCC)    a.) s/p Abbott Assurity MRI dual-chamber pacemaker placement 02/27/2022   Spondylolisthesis of lumbar region    Systolic murmur    Thrombocytopenia (HCC)    Wears dentures    Partial lower   Wears hearing aid in both ears    Does not wear all the time    Past Surgical History: Past Surgical History:  Procedure Laterality Date   APPLICATION OF INTRAOPERATIVE CT SCAN N/A 11/27/2022   Procedure: APPLICATION OF INTRAOPERATIVE CT SCAN;  Surgeon: Jodeen Munch, MD;  Location: [facility];  Service: Neurosurgery;  Laterality: N/A;   CATARACT EXTRACTION W/PHACO Right 11/26/2020   Procedure: CATARACT EXTRACTION PHACO AND INTRAOCULAR LENS PLACEMENT (IOC) RIGHT VIVITY TORIC LENS 7.17 00:49.6;  Surgeon: Clair Crews, MD;  Location: [facility];  Service: Ophthalmology;  Laterality: Right;  Latex Sleep Apnea   CATARACT EXTRACTION W/PHACO Left 12/10/2020   Procedure: CATARACT EXTRACTION PHACO AND INTRAOCULAR LENS PLACEMENT (IOC) LEFT VIVITY;  Surgeon: Clair Crews, MD;  Location: [facility];  Service: Ophthalmology;  Laterality: Left;  Latex Sleep apnea 5.37 00:32.7   COLONOSCOPY  02/2011   ext hem, benign polyp, rpt 5 yrs ([place] )   COLONOSCOPY WITH PROPOFOL  N/A 12/17/2020   multiple TA, ext hem, rpt 3 yrs (Vanga, Elson Halon, MD)   LEG SURGERY  x5   left - after fall at work (3.5 stories)   MANDIBLE SURGERY  1965   jaw fracture - horse kick   MOHS SURGERY Right 07/03/2021   Ear   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   Procedure: PACEMAKER IMPLANT;  Surgeon: Lei Pump, MD;  Location: [facility];  Service: Cardiovascular;  Laterality: N/A;   TRANSFORAMINAL LUMBAR INTERBODY FUSION W/ MIS 1 LEVEL N/A 11/27/2022   Procedure: L4-5 MINIMALLY INVASIVE (MIS) TRANSFORAMINAL LUMBAR INTERBODY FUSION (TLIF);  Surgeon: Jodeen Munch, MD;  Location: [facility];  Service:  Neurosurgery;  Laterality: N/A;   TYMPANIC MEMBRANE REPAIR Left 1994   otosclerosis    Allergies: Allergies as of 01/03/2024 - Review Complete 12/13/2023  Allergen Reaction Noted   Amlodipine  Rash 06/24/2021   Allopurinol Other (See Comments) 09/13/2012   Lisinopril -hydrochlorothiazide   02/16/2013   Procardia  [nifedipine ] Other (See Comments) 07/12/2013   Clonidine  derivatives Rash 02/25/2022   Doxazosin  mesylate Rash 10/07/2017   Latex Rash 11/14/2020   Nortriptyline  Rash 07/01/2018   Uloric  [febuxostat ] Rash 05/07/2017    Medications:  Current Outpatient Medications:    aspirin  EC 81 MG tablet, Take 81 mg by mouth daily., Disp: , Rfl:    carvedilol  (COREG ) 25 MG tablet, Take 1 tablet (25 mg total) by mouth 2 (two) times daily  with a meal., Disp: 180 tablet, Rfl: 3   colchicine  0.6 MG tablet, TAKE ONE TABLET BY MOUTH ONCE DAILY, Disp: 90 tablet, Rfl: 3   fexofenadine (ALLEGRA) 180 MG tablet, Take 180 mg by mouth every morning., Disp: , Rfl:    fluticasone  (FLONASE ) 50 MCG/ACT nasal spray, SPRAY 2 SPRAYS INTO EACH NOSTRIL EVERY DAY, Disp: 48 mL, Rfl: 0   gabapentin  (NEURONTIN ) 300 MG capsule, TAKE 1 CAPSULE BY MOUTH TWICE A DAY AND 2 CAPSULES AT BEDTIME, Disp: 360 capsule, Rfl: 1   hydrALAZINE  (APRESOLINE ) 100 MG tablet, Take 1 tablet (100 mg total) by mouth 3 (three) times daily., Disp: 270 tablet, Rfl: 3   minoxidil  (LONITEN ) 2.5 MG tablet, TAKE 1 TABLET BY MOUTH DAILY AT BEDTIME, Disp: 90 tablet, Rfl: 2   montelukast  (SINGULAIR ) 10 MG tablet, TAKE 1 TABLET BY MOUTH EVERYDAY AT BEDTIME, Disp: 90 tablet, Rfl: 0   nitroGLYCERIN  (NITROSTAT ) 0.4 MG SL tablet, Place 1 tablet (0.4 mg total) under the tongue every 5 (five) minutes as needed for chest pain., Disp: 25 tablet, Rfl: 4   pravastatin  (PRAVACHOL ) 40 MG tablet, Take 1 tablet (40 mg total) by mouth daily., Disp: 90 tablet, Rfl: 3   probenecid  (BENEMID ) 500 MG tablet, TAKE 1/2 TABLET TWICE A DAY BY MOUTH, Disp: 90 tablet, Rfl: 1    spironolactone  (ALDACTONE ) 25 MG tablet, Take 1 tablet (25 mg total) by mouth daily., Disp: 90 tablet, Rfl: 3   valsartan  (DIOVAN ) 320 MG tablet, Take 1 tablet (320 mg total) by mouth daily., Disp: 90 tablet, Rfl: 3   vitamin C (ASCORBIC ACID) 500 MG tablet, Take 500 mg by mouth daily., Disp: , Rfl:   Social History: Social History   Tobacco Use   Smoking status: Former    Current packs/day: 0.00    Average packs/day: 1 pack/day for 4.0 years (4.0 ttl pk-yrs)    Types: Pipe, Cigarettes    Start date: 07/27/1974    Quit date: 07/27/1978    Years since quitting: 45.4   Smokeless tobacco: Never  Vaping Use   Vaping status: Never Used  Substance Use Topics   Alcohol use: No    Alcohol/week: 0.0 standard drinks of alcohol   Drug use: No    Family Medical History: Family History  Problem Relation Age of Onset   Cancer Mother        rectal   Hypertension Mother    Cancer Father 10       prostate   Cancer Paternal Uncle        prostate   Diabetes Neg Hx    Stroke Neg Hx    CAD Neg Hx     Physical Examination: There were no vitals filed for this visit.  General: Patient is in no apparent distress. Attention to examination is appropriate.  Neck:   Supple.  Full range of motion.  Respiratory: Patient is breathing without any difficulty.   NEUROLOGICAL:     Awake, alert, oriented to person, place, and time.  Speech is clear and fluent.   Cranial Nerves: Pupils equal round and reactive to light.  Facial tone is symmetric. Shoulder shrug is symmetric. Tongue protrusion is midline.  There is no pronator drift.  Motor Exam:  ***  Reflexes are ***2+ and symmetric at the biceps, triceps, brachioradialis, patella and achilles.   Hoffman's is absent. Clonus is Absent  Bilateral upper and lower extremity sensation is intact to light touch ***.     Gait is normal.  ***  Medical Decision Making  Imaging: ***  Electrodiagnostics: ***  I have personally reviewed the  images and electrodiagnostics and agree with the above interpretation.  Assessment and Plan: Mr. Endicott is a pleasant 73 y.o. male with ***. The symptoms are causing a significant impact on the patient's life. I have utilized the care everywhere function in epic to review the outside records available from external health systems, and have personally reviewed relevant imaging and electrodiagnostic workup.    Thank you for involving me in the care of this patient.    Carroll Clamp MD/MSCR Neurosurgery - Peripheral Nerve Surgery

## 2024-01-03 ENCOUNTER — Ambulatory Visit: Admitting: Neurosurgery

## 2024-01-03 ENCOUNTER — Other Ambulatory Visit: Payer: Self-pay

## 2024-01-03 ENCOUNTER — Ambulatory Visit (INDEPENDENT_AMBULATORY_CARE_PROVIDER_SITE_OTHER): Admitting: Neurosurgery

## 2024-01-03 ENCOUNTER — Encounter: Payer: Self-pay | Admitting: Neurosurgery

## 2024-01-03 VITALS — BP 122/74 | Ht 67.0 in | Wt 238.0 lb

## 2024-01-03 DIAGNOSIS — G5603 Carpal tunnel syndrome, bilateral upper limbs: Secondary | ICD-10-CM | POA: Insufficient documentation

## 2024-01-03 DIAGNOSIS — R29898 Other symptoms and signs involving the musculoskeletal system: Secondary | ICD-10-CM | POA: Insufficient documentation

## 2024-01-03 DIAGNOSIS — Z01818 Encounter for other preprocedural examination: Secondary | ICD-10-CM

## 2024-01-03 NOTE — H&P (View-Only) (Signed)
 Referring Physician:  Claire Crick, MD [address] [place],  [place] [address]  Primary Physician:  Claire Crick, MD  History of Present Illness: 01/03/24  [name] is here today with a chief complaint of severe bilateral carpal tunnel syndrome.  He has been dealing with this for over 3 years.  He is well-known to our service for multiple spine issues.  He is here today specifically for his hands.  He has had a significant workup which includes an EMG and nerve conduction study which will be discussed below.  In regards to his hands he wakes up every night with numbness and tingling in his bilateral hands that he has to shake out.  He also gets this exacerbated while using his cane and while using a phone or reading.  He has not had injections nor physical therapy for his hand specifically.   Review of Systems:  A 10 point review of systems is negative, except for the pertinent positives and negatives detailed in the HPI.  Past Medical History: Past Medical History:  Diagnosis Date   Allergic rhinitis    Arthritis    Bradycardia    Chest pain    a. 11/2011 normal stress test performed in [place]  - see chart.   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 moderate, AHI 37.4 increased to 80 in REM, on BiPAP 14/10, 97% compliance >4 hrs (06/2013); b. Does not tolerate CPAP.   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 Treated with vicodin, prior tried fentanyl  and butrans.   Sinus node dysfunction (HCC)    a.) s/p Abbott Assurity MRI dual-chamber pacemaker placement 02/27/2022   Spondylolisthesis of lumbar region    Systolic murmur    Thrombocytopenia (HCC)    Wears dentures    Partial lower   Wears hearing aid in both ears    Does not wear all the time    Past Surgical History: Past  Surgical History:  Procedure Laterality Date   APPLICATION OF INTRAOPERATIVE CT SCAN N/A 11/27/2022   Procedure: APPLICATION OF INTRAOPERATIVE CT SCAN;  Surgeon: Jodeen Munch, MD;  Location: [facility];  Service: Neurosurgery;  Laterality: N/A;   CATARACT EXTRACTION W/PHACO Right 11/26/2020   Procedure: CATARACT EXTRACTION PHACO AND INTRAOCULAR LENS PLACEMENT (IOC) RIGHT VIVITY TORIC LENS 7.17 00:49.6;  Surgeon: Clair Crews, MD;  Location: [facility];  Service: Ophthalmology;  Laterality: Right;  Latex Sleep Apnea   CATARACT EXTRACTION W/PHACO Left 12/10/2020   Procedure: CATARACT EXTRACTION PHACO AND INTRAOCULAR LENS PLACEMENT (IOC) LEFT VIVITY;  Surgeon: Clair Crews, MD;  Location: [facility];  Service: Ophthalmology;  Laterality: Left;  Latex Sleep  apnea 5.37 00:32.7   COLONOSCOPY  02/2011   ext hem, benign polyp, rpt 5 yrs ([place] )   COLONOSCOPY WITH PROPOFOL  N/A 12/17/2020   multiple TA, ext hem, rpt 3 yrs (Vanga, Elson Halon, MD)   LEG SURGERY  x5   left - after fall at work (3.5 stories)   MANDIBLE SURGERY  1965   jaw fracture - horse kick   MOHS SURGERY Right 07/03/2021   Ear   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   Procedure: PACEMAKER IMPLANT;  Surgeon: Lei Pump, MD;  Location: [facility];  Service: Cardiovascular;  Laterality: N/A;   TRANSFORAMINAL LUMBAR INTERBODY FUSION W/ MIS 1 LEVEL N/A 11/27/2022   Procedure: L4-5 MINIMALLY INVASIVE (MIS) TRANSFORAMINAL LUMBAR INTERBODY FUSION (TLIF);  Surgeon: Jodeen Munch, MD;  Location: [facility];  Service: Neurosurgery;  Laterality: N/A;   TYMPANIC MEMBRANE REPAIR Left 1994   otosclerosis    Allergies: Allergies as of 01/03/2024 - Review Complete 12/13/2023  Allergen Reaction Noted   Amlodipine  Rash 06/24/2021   Allopurinol Other (See Comments) 09/13/2012   Lisinopril -hydrochlorothiazide   02/16/2013   Procardia  [nifedipine ] Other (See Comments) 07/12/2013   Clonidine  derivatives Rash 02/25/2022   Doxazosin  mesylate Rash 10/07/2017   Latex Rash 11/14/2020   Nortriptyline  Rash 07/01/2018   Uloric  [febuxostat ] Rash 05/07/2017    Medications:  Current Outpatient Medications:    aspirin  EC 81 MG tablet, Take 81 mg by mouth daily., Disp: , Rfl:    carvedilol  (COREG ) 25 MG tablet, Take 1 tablet (25 mg total) by mouth 2 (two) times daily with a meal., Disp: 180 tablet, Rfl: 3   colchicine  0.6 MG tablet, TAKE ONE TABLET BY MOUTH ONCE DAILY, Disp: 90 tablet, Rfl: 3   fexofenadine (ALLEGRA) 180 MG tablet, Take 180 mg by mouth every morning., Disp: , Rfl:    fluticasone  (FLONASE ) 50 MCG/ACT nasal spray, SPRAY 2 SPRAYS INTO EACH NOSTRIL EVERY DAY, Disp: 48 mL, Rfl: 0   gabapentin  (NEURONTIN ) 300 MG capsule, TAKE 1 CAPSULE BY MOUTH TWICE A DAY AND 2 CAPSULES AT  BEDTIME, Disp: 360 capsule, Rfl: 1   hydrALAZINE  (APRESOLINE ) 100 MG tablet, Take 1 tablet (100 mg total) by mouth 3 (three) times daily., Disp: 270 tablet, Rfl: 3   minoxidil  (LONITEN ) 2.5 MG tablet, TAKE 1 TABLET BY MOUTH DAILY AT BEDTIME, Disp: 90 tablet, Rfl: 2   montelukast  (SINGULAIR ) 10 MG tablet, TAKE 1 TABLET BY MOUTH EVERYDAY AT BEDTIME, Disp: 90 tablet, Rfl: 0   nitroGLYCERIN  (NITROSTAT ) 0.4 MG SL tablet, Place 1 tablet (0.4 mg total) under the tongue every 5 (five) minutes as needed for chest pain., Disp: 25 tablet, Rfl: 4   pravastatin  (PRAVACHOL ) 40 MG tablet, Take 1 tablet (40 mg total) by mouth daily., Disp: 90 tablet, Rfl: 3   probenecid  (  BENEMID ) 500 MG tablet, TAKE 1/2 TABLET TWICE A DAY BY MOUTH, Disp: 90 tablet, Rfl: 1   spironolactone  (ALDACTONE ) 25 MG tablet, Take 1 tablet (25 mg total) by mouth daily., Disp: 90 tablet, Rfl: 3   valsartan  (DIOVAN ) 320 MG tablet, Take 1 tablet (320 mg total) by mouth daily., Disp: 90 tablet, Rfl: 3   vitamin C (ASCORBIC ACID) 500 MG tablet, Take 500 mg by mouth daily., Disp: , Rfl:   Social History: Social History   Tobacco Use   Smoking status: Former    Current packs/day: 0.00    Average packs/day: 1 pack/day for 4.0 years (4.0 ttl pk-yrs)    Types: Pipe, Cigarettes    Start date: 07/27/1974    Quit date: 07/27/1978    Years since quitting: 45.4   Smokeless tobacco: Never  Vaping Use   Vaping status: Never Used  Substance Use Topics   Alcohol use: No    Alcohol/week: 0.0 standard drinks of alcohol   Drug use: No    Family Medical History: Family History  Problem Relation Age of Onset   Cancer Mother        rectal   Hypertension Mother    Cancer Father 37       prostate   Cancer Paternal Uncle        prostate   Diabetes Neg Hx    Stroke Neg Hx    CAD Neg Hx     Physical Examination: There were no vitals filed for this visit.  General: Patient is in no apparent distress. Attention to examination is  appropriate.  Neck:   Supple.  Full range of motion.  Respiratory: Patient is breathing without any difficulty.   NEUROLOGICAL:     Awake, alert, oriented to person, place, and time.  Speech is clear and fluent.   Cranial Nerves: Pupils equal round and reactive to light.  Facial tone is symmetric. Shoulder shrug is symmetric. Tongue protrusion is midline.  There is no pronator drift.  Motor Exam:  He shows mild approximately 4+ weakness in the bilateral median innervated thenar muscles with sparing of his ulnar innervated intrinsic hand muscles.  He does not have major wasting noted.  He has positive reverse Phalen sign and positive carpal compression test.  He has splitting of his ring finger sensory wise.   Medical Decision Making  Electrodiagnostics:  [facility]  [address] [place], [address]  [place], [place] [address] Tel: [phone] Fax: [phone] Test Date:  11/19/2023   Patient: Randy Kennedy DOB: [birth year] Physician: Reyna Cava, DO  Sex: Male Height: 5\' 7"  Ref Phys: [name], Kirby Peoples  ID#: [id number]     Technician:      History: This is a 73 year old male referred for evaluation of numbness and tingling involving both hands.   NCV & EMG Findings: Extensive electrodiagnostic testing of the right upper extremity and additional studies of the left shows:  Bilateral median sensory responses show prolonged latency (R5.8, L6.2 ms) and reduced amplitude (R5.5, L4.1 V).  Bilateral ulnar sensory responses are within normal limits. Bilateral median motor responses show prolonged latency (R5.1, L5.8 ms).  Bilateral ulnar motor responses are within normal limits. Chronic motor axonal loss changes are seen affecting bilateral abductor pollicis brevis muscles, without accompanying active denervation.   Impression: Bilateral median neuropathy at or distal to the wrist, consistent with a clinical diagnosis of carpal tunnel syndrome.  Overall, these findings  are moderate-to-severe in degree electrically.     ___________________________  Reyna Cava, DO  I have personally reviewed the images and electrodiagnostics and agree with the above interpretation.  Assessment and Plan: Mr. Lamadrid is a pleasant 73 y.o. male with severe bilateral carpal tunnel syndrome left worse than right.  He presents for numbness tingling that wakes him up from night for the last 3 years.  He often has to wake up, sit up and shake out his hands multiple times per night.  This is kept him from sleeping through the night.  He also uses a cane for ambulation mostly in his right hand.  He has noticed that the numbness is getting worse.  He often drops his cane or drops things with his hands.  On physical examination he has mild weakness in his median innervated intrinsic musculature.  He does have decree sensation as well subjectively approximately 70 to 80% loss.  His provocative signs are positive for carpal tunnel tests.  His EMG demonstrated moderately severe carpal tunnel bilaterally.  Symptomatically is worse on the left.  We discussed carpal tunnel decompression.  We discussed open versus ultrasound-guided.  Given his cane dependence in his right hand I would recommend at least that side be ultrasound-guided.  He would like to try both with the ultrasound-guided technique and can convert to open if there is no clean c court order.  Will plan on left-sided tunnel decompression followed by a right sided carpal tunnel decompression should he have a good result.  Thank you for involving me in the care of this patient.    Carroll Clamp MD/MSCR Neurosurgery - Peripheral Nerve Surgery

## 2024-01-03 NOTE — Patient Instructions (Signed)
 Please see below for information in regards to your upcoming surgery:   Planned surgery: left carpal tunnel release with ultrasound guidance, possible convert to open   Surgery date: 01/18/24 at [facility] (Medical Mall: [address] [place], [place] [address]) - you will find out your arrival time the business day before your surgery.   Pre-op appointment at [facility] Pre-admit Testing: you will receive a call with a date/time for this appointment. If you are scheduled for an in person appointment, Pre-admit Testing is located on the first floor of the [facility] building, [address], [address]. During this appointment, they will advise you which medications you can take the morning of surgery, and which medications you will need to hold for surgery. Labs (such as blood work, EKG) may be done at your pre-op appointment. You are not required to fast for these labs. Should you need to change your pre-op appointment, please call Pre-admit testing at [phone].      Blood thinners:    Aspirin  81mg :  OK to stay on aspirin  81mg     How to contact us :  If you have any questions/concerns before or after surgery, you can reach us  at [phone], or you can send a mychart message. We can be reached by phone or mychart 8am-4pm, Monday-Friday.  *Please note: Calls after 4pm are forwarded to a third party answering service. Mychart messages are not routinely monitored during evenings, weekends, and holidays. Please call our office to contact the answering service for urgent concerns during non-business hours.    If you have FMLA/disability paperwork, please drop it off or fax it to [phone], attention Patty.   Appointments/FMLA & disability paperwork: Gerlean Kocher, & Maryann Smalls Registered Nurses/Surgery schedulers: Fleurette Woolbright & Lauren Medical Assistants: Donnajean Fuse Physician Assistants: Ludwig Safer, PA-C, Anastacio Karvonen, PA-C & [name],  PA-C Surgeons: Jodeen Munch, MD & Henderson Lock, MD   [facility] PREADMIT TESTING VISIT and SURGERY INFORMATION SHEET   Now that surgery has been scheduled you can anticipate several phone calls from [facility] services. A pharmacy technician will call you to verify your current list of medications taken at home.               The Pre-Service Center will call to verify your insurance information and to give you billing estimates and information.             The Preadmit Testing Office will be calling to schedule a visit to obtain information for the anesthesia team and provide instructions on preparation for surgery.  What can you expect for the Preadmit Testing Visit: Appointments may be scheduled in-person or by telephone.  If a telephone visit is scheduled, you may be asked to come into the office to have lab tests or other studies performed.   This visit will not be completed any greater than 14 days prior to your surgery.  If your surgery has been scheduled for a future date, please do not be alarmed if we have not contacted you to schedule an appointment more than a month prior to the surgery date.    Please be prepared to provide the following information during this appointment:            -Personal medical history                                               -  Medication and allergy list            -Any history of problems with anesthesia              -Recent lab work or diagnostic studies            -Please notify us  of any needs we should be aware of to provide the best care possible           -You will be provided with instructions on how to prepare for your surgery.    On The Day of Surgery:  You must have a driver to take you home after surgery, you will be asked not to drive for 24 hours following surgery.  Taxi, [employer] and non-medical transport will not be acceptable means of transportation unless you have a responsible individual who will be  traveling with you.  Visitors in the surgical area:   2 people will be able to visit you in your room once your preparation for surgery has been completed. During surgery, your visitors will be asked to wait in the Surgery Waiting Area.  It is not a requirement for them to stay, if they prefer to leave and come back.  Your visitor(s) will be given an update once the surgery has been completed.  No visitors are allowed in the initial recovery room to respect patient privacy and safety.  Once you are more awake and transfer to the secondary recovery area, or are transferred to an inpatient room, visitors will again be able to see you.  To respect and protect your privacy: We will ask on the day of surgery who your driver will be and what the contact number for that individual will be. We will ask if it is okay to share information with this individual, or if there is an alternative individual that we, or the surgeon, should contact to provide updates and information. If family or friends come to the surgical information desk requesting information about you, who you have not listed with us , no information will be given.   It may be helpful to designate someone as the main contact who will be responsible for updating your other friends and family.    PREADMIT TESTING OFFICE: [phone] SAME DAY SURGERY: [phone] We look forward to caring for you before and throughout the process of your surgery.

## 2024-01-03 NOTE — Progress Notes (Signed)
 Referring Physician:  Claire Crick, MD [address] [place],  [place] [address]  Primary Physician:  Claire Crick, MD  History of Present Illness: 01/03/24  [name] is here today with a chief complaint of severe bilateral carpal tunnel syndrome.  He has been dealing with this for over 3 years.  He is well-known to our service for multiple spine issues.  He is here today specifically for his hands.  He has had a significant workup which includes an EMG and nerve conduction study which will be discussed below.  In regards to his hands he wakes up every night with numbness and tingling in his bilateral hands that he has to shake out.  He also gets this exacerbated while using his cane and while using a phone or reading.  He has not had injections nor physical therapy for his hand specifically.   Review of Systems:  A 10 point review of systems is negative, except for the pertinent positives and negatives detailed in the HPI.  Past Medical History: Past Medical History:  Diagnosis Date   Allergic rhinitis    Arthritis    Bradycardia    Chest pain    a. 11/2011 normal stress test performed in [place]  - see chart.   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 moderate, AHI 37.4 increased to 80 in REM, on BiPAP 14/10, 97% compliance >4 hrs (06/2013); b. Does not tolerate CPAP.   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 Treated with vicodin, prior tried fentanyl  and butrans.   Sinus node dysfunction (HCC)    a.) s/p Abbott Assurity MRI dual-chamber pacemaker placement 02/27/2022   Spondylolisthesis of lumbar region    Systolic murmur    Thrombocytopenia (HCC)    Wears dentures    Partial lower   Wears hearing aid in both ears    Does not wear all the time    Past Surgical History: Past  Surgical History:  Procedure Laterality Date   APPLICATION OF INTRAOPERATIVE CT SCAN N/A 11/27/2022   Procedure: APPLICATION OF INTRAOPERATIVE CT SCAN;  Surgeon: Jodeen Munch, MD;  Location: [facility];  Service: Neurosurgery;  Laterality: N/A;   CATARACT EXTRACTION W/PHACO Right 11/26/2020   Procedure: CATARACT EXTRACTION PHACO AND INTRAOCULAR LENS PLACEMENT (IOC) RIGHT VIVITY TORIC LENS 7.17 00:49.6;  Surgeon: Clair Crews, MD;  Location: [facility];  Service: Ophthalmology;  Laterality: Right;  Latex Sleep Apnea   CATARACT EXTRACTION W/PHACO Left 12/10/2020   Procedure: CATARACT EXTRACTION PHACO AND INTRAOCULAR LENS PLACEMENT (IOC) LEFT VIVITY;  Surgeon: Clair Crews, MD;  Location: [facility];  Service: Ophthalmology;  Laterality: Left;  Latex Sleep  apnea 5.37 00:32.7   COLONOSCOPY  02/2011   ext hem, benign polyp, rpt 5 yrs ([place] )   COLONOSCOPY WITH PROPOFOL  N/A 12/17/2020   multiple TA, ext hem, rpt 3 yrs (Vanga, Elson Halon, MD)   LEG SURGERY  x5   left - after fall at work (3.5 stories)   MANDIBLE SURGERY  1965   jaw fracture - horse kick   MOHS SURGERY Right 07/03/2021   Ear   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   Procedure: PACEMAKER IMPLANT;  Surgeon: Lei Pump, MD;  [facility]: [facility];  Service: Cardiovascular;  Laterality: N/A;   TRANSFORAMINAL LUMBAR INTERBODY FUSION W/ MIS 1 LEVEL N/A 11/27/2022   Procedure: L4-5 MINIMALLY INVASIVE (MIS) TRANSFORAMINAL LUMBAR INTERBODY FUSION (TLIF);  Surgeon: Jodeen Munch, MD;  Location: [facility];  Service: Neurosurgery;  Laterality: N/A;   TYMPANIC MEMBRANE REPAIR Left 1994   otosclerosis    Allergies: Allergies as of 01/03/2024 - Review Complete 12/13/2023  Allergen Reaction Noted   Amlodipine  Rash 06/24/2021   Allopurinol Other (See Comments) 09/13/2012   Lisinopril -hydrochlorothiazide   02/16/2013   Procardia  [nifedipine ] Other (See Comments) 07/12/2013   Clonidine  derivatives Rash 02/25/2022   Doxazosin  mesylate Rash 10/07/2017   Latex Rash 11/14/2020   Nortriptyline  Rash 07/01/2018   Uloric  [febuxostat ] Rash 05/07/2017    Medications:  Current Outpatient Medications:    aspirin  EC 81 MG tablet, Take 81 mg by mouth daily., Disp: , Rfl:    carvedilol  (COREG ) 25 MG tablet, Take 1 tablet (25 mg total) by mouth 2 (two) times daily with a meal., Disp: 180 tablet, Rfl: 3   colchicine  0.6 MG tablet, TAKE ONE TABLET BY MOUTH ONCE DAILY, Disp: 90 tablet, Rfl: 3   fexofenadine (ALLEGRA) 180 MG tablet, Take 180 mg by mouth every morning., Disp: , Rfl:    fluticasone  (FLONASE ) 50 MCG/ACT nasal spray, SPRAY 2 SPRAYS INTO EACH NOSTRIL EVERY DAY, Disp: 48 mL, Rfl: 0   gabapentin  (NEURONTIN ) 300 MG capsule, TAKE 1 CAPSULE BY MOUTH TWICE A DAY AND 2 CAPSULES AT  BEDTIME, Disp: 360 capsule, Rfl: 1   hydrALAZINE  (APRESOLINE ) 100 MG tablet, Take 1 tablet (100 mg total) by mouth 3 (three) times daily., Disp: 270 tablet, Rfl: 3   minoxidil  (LONITEN ) 2.5 MG tablet, TAKE 1 TABLET BY MOUTH DAILY AT BEDTIME, Disp: 90 tablet, Rfl: 2   montelukast  (SINGULAIR ) 10 MG tablet, TAKE 1 TABLET BY MOUTH EVERYDAY AT BEDTIME, Disp: 90 tablet, Rfl: 0   nitroGLYCERIN  (NITROSTAT ) 0.4 MG SL tablet, Place 1 tablet (0.4 mg total) under the tongue every 5 (five) minutes as needed for chest pain., Disp: 25 tablet, Rfl: 4   pravastatin  (PRAVACHOL ) 40 MG tablet, Take 1 tablet (40 mg total) by mouth daily., Disp: 90 tablet, Rfl: 3   probenecid  (  BENEMID ) 500 MG tablet, TAKE 1/2 TABLET TWICE A DAY BY MOUTH, Disp: 90 tablet, Rfl: 1   spironolactone  (ALDACTONE ) 25 MG tablet, Take 1 tablet (25 mg total) by mouth daily., Disp: 90 tablet, Rfl: 3   valsartan  (DIOVAN ) 320 MG tablet, Take 1 tablet (320 mg total) by mouth daily., Disp: 90 tablet, Rfl: 3   vitamin C (ASCORBIC ACID) 500 MG tablet, Take 500 mg by mouth daily., Disp: , Rfl:   Social History: Social History   Tobacco Use   Smoking status: Former    Current packs/day: 0.00    Average packs/day: 1 pack/day for 4.0 years (4.0 ttl pk-yrs)    Types: Pipe, Cigarettes    Start date: 07/27/1974    Quit date: 07/27/1978    Years since quitting: 45.4   Smokeless tobacco: Never  Vaping Use   Vaping status: Never Used  Substance Use Topics   Alcohol use: No    Alcohol/week: 0.0 standard drinks of alcohol   Drug use: No    Family Medical History: Family History  Problem Relation Age of Onset   Cancer Mother        rectal   Hypertension Mother    Cancer Father 37       prostate   Cancer Paternal Uncle        prostate   Diabetes Neg Hx    Stroke Neg Hx    CAD Neg Hx     Physical Examination: There were no vitals filed for this visit.  General: Patient is in no apparent distress. Attention to examination is  appropriate.  Neck:   Supple.  Full range of motion.  Respiratory: Patient is breathing without any difficulty.   NEUROLOGICAL:     Awake, alert, oriented to person, place, and time.  Speech is clear and fluent.   Cranial Nerves: Pupils equal round and reactive to light.  Facial tone is symmetric. Shoulder shrug is symmetric. Tongue protrusion is midline.  There is no pronator drift.  Motor Exam:  He shows mild approximately 4+ weakness in the bilateral median innervated thenar muscles with sparing of his ulnar innervated intrinsic hand muscles.  He does not have major wasting noted.  He has positive reverse Phalen sign and positive carpal compression test.  He has splitting of his ring finger sensory wise.   Medical Decision Making  Electrodiagnostics:  [facility]  [address] [place], [address]  [place], [place] [address] Tel: [phone] Fax: [phone] Test Date:  11/19/2023   Patient: Randy Kennedy DOB: [birth year] Physician: Reyna Cava, DO  Sex: Male Height: 5\' 7"  Ref Phys: [name], Kirby Peoples  ID#: [id number]     Technician:      History: This is a 73 year old male referred for evaluation of numbness and tingling involving both hands.   NCV & EMG Findings: Extensive electrodiagnostic testing of the right upper extremity and additional studies of the left shows:  Bilateral median sensory responses show prolonged latency (R5.8, L6.2 ms) and reduced amplitude (R5.5, L4.1 V).  Bilateral ulnar sensory responses are within normal limits. Bilateral median motor responses show prolonged latency (R5.1, L5.8 ms).  Bilateral ulnar motor responses are within normal limits. Chronic motor axonal loss changes are seen affecting bilateral abductor pollicis brevis muscles, without accompanying active denervation.   Impression: Bilateral median neuropathy at or distal to the wrist, consistent with a clinical diagnosis of carpal tunnel syndrome.  Overall, these findings  are moderate-to-severe in degree electrically.     ___________________________  Reyna Cava, DO  I have personally reviewed the images and electrodiagnostics and agree with the above interpretation.  Assessment and Plan: [name] is a pleasant 73 y.o. male with severe bilateral carpal tunnel syndrome left worse than right.  He presents for numbness tingling that wakes him up from night for the last 3 years.  He often has to wake up, sit up and shake out his hands multiple times per night.  This is kept him from sleeping through the night.  He also uses a cane for ambulation mostly in his right hand.  He has noticed that the numbness is getting worse.  He often drops his cane or drops things with his hands.  On physical examination he has mild weakness in his median innervated intrinsic musculature.  He does have decree sensation as well subjectively approximately 70 to 80% loss.  His provocative signs are positive for carpal tunnel tests.  His EMG demonstrated moderately severe carpal tunnel bilaterally.  Symptomatically is worse on the left.  We discussed carpal tunnel decompression.  We discussed open versus ultrasound-guided.  Given his cane dependence in his right hand I would recommend at least that side be ultrasound-guided.  He would like to try both with the ultrasound-guided technique and can convert to open if there is no clean c court order.  Will plan on left-sided tunnel decompression followed by a right sided carpal tunnel decompression should he have a good result.  Thank you for involving me in the care of this patient.    Carroll Clamp MD/MSCR Neurosurgery - Peripheral Nerve Surgery

## 2024-01-05 ENCOUNTER — Ambulatory Visit: Admitting: Orthopedic Surgery

## 2024-01-05 ENCOUNTER — Encounter: Payer: Self-pay | Admitting: Orthopedic Surgery

## 2024-01-05 VITALS — BP 132/82 | Ht 67.0 in | Wt 238.0 lb

## 2024-01-05 DIAGNOSIS — Z981 Arthrodesis status: Secondary | ICD-10-CM | POA: Diagnosis not present

## 2024-01-05 DIAGNOSIS — M5416 Radiculopathy, lumbar region: Secondary | ICD-10-CM

## 2024-01-05 DIAGNOSIS — M47816 Spondylosis without myelopathy or radiculopathy, lumbar region: Secondary | ICD-10-CM

## 2024-01-05 DIAGNOSIS — M4726 Other spondylosis with radiculopathy, lumbar region: Secondary | ICD-10-CM | POA: Diagnosis not present

## 2024-01-05 DIAGNOSIS — M48061 Spinal stenosis, lumbar region without neurogenic claudication: Secondary | ICD-10-CM

## 2024-01-10 ENCOUNTER — Encounter
Admission: RE | Admit: 2024-01-10 | Discharge: 2024-01-10 | Disposition: A | Discharge status: Home / Self Care | Source: Ambulatory Visit | Attending: Neurosurgery | Admitting: Neurosurgery

## 2024-01-10 ENCOUNTER — Other Ambulatory Visit: Payer: Self-pay

## 2024-01-10 VITALS — Ht 67.0 in | Wt 220.0 lb

## 2024-01-10 DIAGNOSIS — Z79899 Other long term (current) drug therapy: Secondary | ICD-10-CM

## 2024-01-10 NOTE — Patient Instructions (Addendum)
 Your procedure is scheduled on: Tuesday 01/18/24 To find out your arrival time, please call [phone] between 1PM - 3PM on: Monday 01/17/24   Report to the Registration Desk on the 1st floor of the [facility]. Free Valet parking is available.  If your arrival time is 6:00 am, do not arrive before that time as the [facility] entrance doors do not open until 6:00 am.  REMEMBER: Instructions that are not followed completely may result in serious medical risk, up to and including death; or upon the discretion of your surgeon and anesthesiologist your surgery may need to be rescheduled.  Do not eat food after midnight the night before surgery.  No gum chewing or hard candies.  You may however, drink CLEAR liquids up to 2 hours before you are scheduled to arrive for your surgery. Do not drink anything within 2 hours of your scheduled arrival time.  Clear liquids include: - water   - apple juice without pulp - gatorade (not RED colors) - black coffee or tea (Do NOT add milk or creamers to the coffee or tea) Do NOT drink anything that is not on this list.  Type 1 and Type 2 diabetics should only drink water .  One week prior to surgery: Stop Anti-inflammatories (NSAIDS) such as Advil, Aleve, Ibuprofen, Motrin, Naproxen, Naprosyn and Aspirin  based products such as Excedrin, Goody's Powder, BC Powder. NO ASPIRIN  ON THE DAY OF YOUR SURGERY You may however, continue to take Tylenol  if needed for pain up until the day of surgery.  Stop ANY OVER THE COUNTER supplements and vitamins for 7 days until after surgery.  Continue taking all prescribed medications.   TAKE ONLY THESE MEDICATIONS THE MORNING OF SURGERY WITH A SIP OF WATER :  carvedilol  (COREG ) 25 MG tablet  fexofenadine (ALLEGRA) 180 MG tablet  hydrALAZINE  (APRESOLINE ) 100 MG tablet  probenecid  (BENEMID ) 500 MG (1/2) tablet   No Alcohol for 24 hours before or after surgery.  No Smoking including e-cigarettes for 24 hours before  surgery.  No chewable tobacco products for at least 6 hours before surgery.  No nicotine patches on the day of surgery.  Do not use any recreational drugs for at least a week (preferably 2 weeks) before your surgery.  Please be advised that the combination of cocaine and anesthesia may have negative outcomes, up to and including death. If you test positive for cocaine, your surgery will be cancelled.  On the morning of surgery brush your teeth with toothpaste and water , you may rinse your mouth with mouthwash if you wish. Do not swallow any toothpaste or mouthwash.  Use CHG Soap or wipes as directed on instruction sheet.  Do not wear lotions, powders, or perfumes/COLOGNE.   Do not shave body hair from the neck down 48 hours before surgery.  Wear comfortable clothing (specific to your surgery type) to the hospital.  Do not wear jewelry, make-up, hairpins, clips or nail polish.  For welded (permanent) jewelry: bracelets, anklets, waist bands, etc.  Please have this removed prior to surgery.  If it is not removed, there is a chance that hospital personnel will need to cut it off on the day of surgery. Contact lenses, hearing aids and dentures may not be worn into surgery.  Do not bring valuables to the hospital. [facility] is not responsible for any missing/lost belongings or valuables.   Notify your doctor if there is any change in your medical condition (cold, fever, infection).  If you are being discharged the day  of surgery, you will not be allowed to drive home. You will need a responsible individual to drive you home and stay with you for 24 hours after surgery.   After surgery, you can help prevent lung complications by doing breathing exercises.  Take deep breaths and cough every 1-2 hours. Your doctor may order a device called an Incentive Spirometer to help you take deep breaths. . Surgery Visitation Policy:  Patients undergoing a surgery or procedure may have two family  members or support persons with them as long as the person is not COVID-19 positive or experiencing its symptoms.   Please call the Pre-admissions Testing Dept. at [phone] if you have any questions about these instructions.

## 2024-01-11 ENCOUNTER — Telehealth: Payer: Self-pay | Admitting: *Deleted

## 2024-01-11 NOTE — Telephone Encounter (Signed)
-----   Message from Shirline Dover sent at 01/11/2024 10:12 AM EDT ----- Regarding: Request for pre-operative cardiac clearance Request for pre-operative cardiac clearance:  1. What type of surgery is being performed?  MIS LEFT CARPAL TUNNELTUNNEL RELEASE WITH ULTRASOUND, POSSIBLE CONVERSION TO OPEN  2. When is this surgery scheduled?  01/18/2024  3. Type of clearance being requested (medical, pharmacy, both)? MEDICAL   4. Are there any medications that need to be held prior to surgery? N/A - patient to continue daily LOW DOSE ASPIRIN  throughout the perioperative course  5. Practice name and name of physician performing surgery?  Performing surgeon: Dr. Henderson Lock, MD Requesting clearance: Renate Caroline, FNP-C    6. Anesthesia type (none, local, MAC, general)? GENERAL  7. What is the office phone and fax number?   Phone: [phone] Fax: [phone]  ATTENTION: Unable to create telephone message as per your standard workflow. Directed by [facility] providers to send requests for cardiac clearance to this pool for appropriate distribution to provider covering pre-operative clearances.   Renate Caroline, MSN, APRN, FNP-C, CEN [facility]  Peri-operative Services Nurse Practitioner Phone: [phone] 01/11/24 10:12 AM

## 2024-01-11 NOTE — Telephone Encounter (Signed)
   Name: Randy Kennedy  DOB: 21-Jan-1951  MRN: [id number]  Primary Cardiologist: Belva Boyden, MD   Preoperative team, please contact this patient and set up a phone call appointment for further preoperative risk assessment. Please obtain consent and complete medication review. Thank you for your help.  Okay to use provider slot Thursday 6/19.   I confirm that guidance regarding antiplatelet and oral anticoagulation therapy has been completed and, if necessary, noted below.  None requested.   I also confirmed the patient resides in the state of [place] . As per [place] [facility] [employer] telemedicine laws, the patient must reside in the state in which the provider is licensed.   Morey Ar, NP 01/11/2024, 11:16 AM [facility]

## 2024-01-11 NOTE — Telephone Encounter (Signed)
 Pt has been scheduled 6/23 @ [address]

## 2024-01-11 NOTE — Telephone Encounter (Signed)
   Pre-operative Risk Assessment    Patient Name: Randy Kennedy  DOB: Apr 07, 1951 MRN: [id number]   Date of last office visit: 08/30/23 DR. GOLLAN Date of next office visit: NONE   Request for Surgical Clearance    Procedure:  MIS LEFT CARPAL TUNNELTUNNEL RELEASE WITH ULTRASOUND, POSSIBLE CONVERSION TO OPEN  Date of Surgery:  Clearance 01/18/24                                Surgeon:  DR. Henderson Lock Surgeon's Group or Practice Name:  [facility] Phone number:  [phone]  Fax number:  [phone]   Type of Clearance Requested:   - Medical ;PER CLEARANCE REQUEST PT CAN REMAIN ON ASA PERIOPERATIVE COURSE   Type of Anesthesia:  General    Additional requests/questions:    Signed, Valmore Arabie   01/11/2024, 10:18 AM

## 2024-01-11 NOTE — Telephone Encounter (Signed)
 Patient has not been seen by EP provider since 05/2022. Needs to be seen before clearance can be given.  Procedure date 01/18/24.   Forwarding to EP scheduling team to get set up as soon as possible.  Thanks.

## 2024-01-11 NOTE — Telephone Encounter (Addendum)
 See notes from EP RN. Pt has been scheduled tele preop appt 01/17/24.   I will update all parties involved. I have update the preop APP as well pt will need in office appt as last seen by EP 2023. Per preop APP Morey Ar, NP please be sure appt notes note need preop clearance.

## 2024-01-11 NOTE — Telephone Encounter (Signed)
 Left message to call back to preop team to schedule a tele preop appt.

## 2024-01-12 DIAGNOSIS — M47816 Spondylosis without myelopathy or radiculopathy, lumbar region: Secondary | ICD-10-CM

## 2024-01-12 DIAGNOSIS — M5416 Radiculopathy, lumbar region: Secondary | ICD-10-CM

## 2024-01-12 DIAGNOSIS — Z981 Arthrodesis status: Secondary | ICD-10-CM

## 2024-01-13 NOTE — Addendum Note (Signed)
 Addended byLucetta Russel on: 01/13/2024 03:45 PM   Modules accepted: Orders

## 2024-01-14 ENCOUNTER — Other Ambulatory Visit: Payer: Self-pay

## 2024-01-14 ENCOUNTER — Inpatient Hospital Stay: Admission: RE | Admit: 2024-01-14 | Source: Ambulatory Visit

## 2024-01-14 ENCOUNTER — Encounter: Payer: Self-pay | Admitting: Neurosurgery

## 2024-01-14 ENCOUNTER — Encounter
Admission: RE | Admit: 2024-01-14 | Discharge: 2024-01-14 | Disposition: A | Discharge status: Home / Self Care | Source: Ambulatory Visit | Attending: Neurosurgery | Admitting: Neurosurgery

## 2024-01-14 DIAGNOSIS — Z01812 Encounter for preprocedural laboratory examination: Secondary | ICD-10-CM | POA: Diagnosis not present

## 2024-01-14 DIAGNOSIS — R202 Paresthesia of skin: Secondary | ICD-10-CM

## 2024-01-14 DIAGNOSIS — Z79899 Other long term (current) drug therapy: Secondary | ICD-10-CM | POA: Insufficient documentation

## 2024-01-14 DIAGNOSIS — Z01818 Encounter for other preprocedural examination: Secondary | ICD-10-CM | POA: Diagnosis present

## 2024-01-14 LAB — BASIC METABOLIC PANEL WITH GFR
Anion gap: 7 (ref 5–15)
BUN: 22 mg/dL (ref 8–23)
CO2: 26 mmol/L (ref 22–32)
Calcium: 9 mg/dL (ref 8.9–10.3)
Chloride: 104 mmol/L (ref 98–111)
Creatinine, Ser: 1.13 mg/dL (ref 0.61–1.24)
GFR, Estimated: >60 mL/min (ref >60)
Glucose, Bld: 112 mg/dL — ABNORMAL HIGH (ref 70–99)
Potassium: 4.4 mmol/L (ref 3.5–5.1)
Sodium: 137 mmol/L (ref 135–145)

## 2024-01-14 NOTE — Progress Notes (Incomplete)
 Perioperative / Anesthesia Services  Pre-Admission Testing Clinical Review / Pre-Operative Anesthesia Consult  Date: 01/14/24  PATIENT DEMOGRAPHICS: Name: Randy Kennedy DOB: [birth year] MRN:   [id number]  Note: Available PAT nursing documentation and vital signs have been reviewed. Clinical nursing staff has updated patient's PMH/PSHx, current medication list, and drug allergies/intolerances to ensure complete and comprehensive history available to assist care teams in MDM as it pertains to the aforementioned surgical procedure and anticipated anesthetic course. Extensive review of available clinical information personally performed.  PMH and PSHx updated with any diagnoses/procedures that  may have been inadvertently omitted during his intake with the pre-admission testing department's nursing staff.  PLANNED SURGICAL PROCEDURE(S):   Case: [id number] Date/Time: 01/18/24 0700   Procedure: CARPAL TUNNEL RELEASE (Left) - MIS LEFT CARPAL TUNNELTUNNEL RELEASE WITH ULTRASOUND, POSSIBLE CONVERTION TO OPEN   Anesthesia type: Monitor Anesthesia Care   Diagnosis:      Hand weakness [R29.898]     Bilateral carpal tunnel syndrome [G56.03]   Pre-op diagnosis:      G56.03 Bilateral carpal tunnel syndrome     R29.898 Hand weakness   Location: [facility] OR ROOM 03 / [facility]   Surgeons: Carroll Clamp, MD        CLINICAL DISCUSSION: Randy Kennedy is a 73 y.o. male who is submitted for pre-surgical anesthesia review and clearance prior to him undergoing the above procedure.  Patient is a Former Smoker (4 pack years; quit 07/1978). Pertinent PMH includes: symptomatic bradycardia, sinoatrial node dysfunction (s/p PPM placement), RBBB, diastolic dysfunction, cardiac murmur, angina, HTN, HLD, GERD (on H2 blocker as needed), OSAH (noncompliant with prescribed nocturnal PAP therapy), dyspnea, thrombocytopenia, Gilbert disease, HBV (as a child), chronic fatigue, chronic  lower back pain with BILATERAL sciatica, spondylolisthesis of lumbar region, OA, carpal tunnel syndrome, lower extremity RSD, peripheral neuropathy, peripheral edema.   Patient is followed by cardiology (Gollan, MD). He was last seen in the cardiology clinic on ***; notes reviewed. ***At the time of his clinic visit, patient doing well overall from a cardiovascular perspective. Patient denied any chest pain, shortness of breath, PND, orthopnea, palpitations, significant peripheral edema, weakness, fatigue, vertiginous symptoms, or presyncope/syncope. Patient with a past medical history significant for cardiovascular diagnoses. Documented physical exam was grossly benign, providing no evidence of acute exacerbation and/or decompensation of the patient's known cardiovascular conditions. Of note, complete cardiovascular history unavailable for review at time of consult. Patient has received care in [place] . Information gathered from patient report and from notes provided by his local cardiologist.   Patient underwent myocardial perfusion imaging study on 12/11/2011.  Study demonstrated normal left ventricular systolic function with an EF of 55%.  There was no evidence of stress-induced myocardial ischemia or arrhythmia; no scintigraphic evidence of scar.  Study determined to be normal and low risk.   Most recent TTE was performed on 02/26/2022 revealing a normal left ventricular systolic function with an EF of 60-65%.  There were no regional wall motion abnormalities. Left ventricular diastolic Doppler parameters consistent with pseudonormalization (G2DD).  Right ventricular size was moderately enlarged with normal function.  PASP normal at 32.2 mmHg.  There was moderate left and severe right atrial enlargement.  There was trivial aortic valve regurgitation. All transvalvular gradients were noted to be normal providing no evidence suggestive of valvular stenosis.   Patient with symptomatic bradycardia secondary to  sinoatrial node dysfunction.  He is status post placement of an Abbott Assurity MRI dual-chamber pacemaker on 02/27/2022.  Device  is regularly interrogated by his primary cardiology team.  Last interrogation was on ***, at which time device was noted to be functioning properly.  Blood pressure***controlled at *** mmHg on currently prescribed *** therapies.  Patient is on *** for his HLD diagnosis and ASCVD prevention. ***Patient is not diabetic. ***He does not have an OSAH diagnosis. ***FC. No changes were made to his medication regimen during his visit with cardiology.  Patient scheduled to follow-up with outpatient cardiology in***months or sooner if needed.  [name] is scheduled for an elective CARPAL TUNNEL RELEASE (Left) on 01/18/2024 with Dr. Henderson Lock, MD.  Given patient's past medical history significant for cardiovascular diagnoses, presurgical cardiac clearance was sought by the PAT team. ***  In review of the patient's chart, it is noted that he is on daily oral antithrombotic therapy. Given that patient's past medical history is significant for cardiovascular diagnoses, including but not limited to CAD, neurosurgery has cleared patient to continue his daily low dose ASA throughout his perioperative course. He will be asked to hold his normal dose on the day of his procedure only. Patient has been updated on these directives from his specialty care providers by the PAT team.  Patient denies previous perioperative complications with anesthesia in the past. In review his EMR, it is noted that patient underwent a general anesthetic course here at [facility] (ASA III) in 11/2022 without documented complications.   MOST RECENT VITAL SIGNS:    01/10/2024    2:27 PM 01/05/2024    8:33 AM 01/03/2024   10:57 AM  Vitals with BMI  Height 5' 7 5' 7 5' 7  Weight 220 lbs 238 lbs 238 lbs  BMI 34.45 37.27 37.27  Systolic  132 122  Diastolic  82 74    PROVIDERS/SPECIALISTS: NOTE: Primary physician provider listed below. Patient may have been seen by APP or partner within same practice.   PROVIDER ROLE / SPECIALTY LAST OV  Carroll Clamp, MD Neurosurgery (Surgeon) 01/05/2024  Claire Crick, MD Primary Care Provider 10/25/2023  Belva Boyden, MD Cardiology 08/30/2023  Harvie Liner, MD Electrophysiology ***   ALLERGIES: Allergies  Allergen Reactions   Amlodipine  Rash    Rash in groin   Allopurinol Other (See Comments)    GI upset   Lisinopril -Hydrochlorothiazide      Burning in feet.   Procardia  [Nifedipine ] Other (See Comments)    Bradycardia, leg swelling   Clonidine  Derivatives Rash   Doxazosin  Mesylate Rash   Latex Rash   Nortriptyline  Rash   Uloric  [Febuxostat ] Rash    CURRENT HOME MEDICATIONS: No current facility-administered medications for this encounter.    aspirin  EC 81 MG tablet   carvedilol  (COREG ) 25 MG tablet   colchicine  0.6 MG tablet   fexofenadine (ALLEGRA) 180 MG tablet   fluticasone  (FLONASE ) 50 MCG/ACT nasal spray   gabapentin  (NEURONTIN ) 300 MG capsule   hydrALAZINE  (APRESOLINE ) 100 MG tablet   minoxidil  (LONITEN ) 2.5 MG tablet   montelukast  (SINGULAIR ) 10 MG tablet   nitroGLYCERIN  (NITROSTAT ) 0.4 MG SL tablet   pravastatin  (PRAVACHOL ) 40 MG tablet   probenecid  (BENEMID ) 500 MG tablet   spironolactone  (ALDACTONE ) 25 MG tablet   valsartan  (DIOVAN ) 320 MG tablet   vitamin C (ASCORBIC ACID) 500 MG tablet   HISTORY: Past Medical History:  Diagnosis Date   Allergic rhinitis    Arthritis    Bradycardia    Carpal tunnel syndrome    Chest pain    a. 11/2011 normal  stress test performed in [place]  - see chart.   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    a.) s/p LEFT TM repair in ~1994   Heartburn    mild, controlled with pepcid    History of bilateral cataract extraction 11/2020   History of chicken pox    History of hepatitis B    as child (76 years of age), no sequelae   HLD (hyperlipidemia)    HTN (hypertension)    difficult to control - clonidine  and beta blockers caused bradycardia   Leg edema    Lobar pneumonia (HCC) 05/13/2022   Long-term use of aspirin  therapy    Morbid obesity (HCC)    Motion sickness    Most moving vehicles   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 moderate, AHI 37.4 increased to 80 in REM, on BiPAP 14/10, 97% compliance >4 hrs (06/2013); b. Does not tolerate CPAP.   Peripheral neuropathy    Pneumonia    Presence of cardiac pacemaker 02/27/2022   a.) Abbott Assurity MRI dual-chamber pacemaker (SN: [id number] )   RBBB    RLS (restless legs syndrome)    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   Reviewed prior workup (saw pain management at [facility], [place]): chronic back pain with RLS + RSD L leg with severe L post-traumatic ankle joint arthosis, no mention of peripheral neuropathy. Treated with vicodin, prior tried fentanyl  and butrans.   Sinus node dysfunction (HCC)    a.) s/p Abbott Assurity MRI dual-chamber pacemaker placement 02/27/2022   Spondylolisthesis of lumbar region    Systolic murmur    Thrombocytopenia (HCC)    Wears dentures    Partial lower   Wears hearing aid in both ears    Does not wear all the time   Past Surgical History:  Procedure Laterality Date   APPLICATION OF INTRAOPERATIVE CT SCAN N/A 11/27/2022   Procedure: APPLICATION OF INTRAOPERATIVE CT SCAN;  Surgeon: Jodeen Munch, MD;   Location: [facility] ORS;  Service: Neurosurgery;  Laterality: N/A;   CATARACT EXTRACTION W/PHACO Right 11/26/2020   Procedure: CATARACT EXTRACTION PHACO AND INTRAOCULAR LENS PLACEMENT (IOC) RIGHT VIVITY TORIC LENS 7.17 00:49.6;  Surgeon: Clair Crews, MD;  Location: [facility];  Service: OphthalmologY   CATARACT EXTRACTION W/PHACO Left 12/10/2020   Procedure: CATARACT EXTRACTION PHACO AND INTRAOCULAR LENS PLACEMENT (IOC) LEFT VIVITY;  Surgeon: Clair Crews, MD;  Location: [facility];  Service: Ophthalmology; LENS 5.3700:32.7   COLONOSCOPY  02/2011   ext hem, benign polyp, rpt 5 yrs ([place] )   COLONOSCOPY WITH PROPOFOL  N/A 12/17/2020   multiple TA, ext hem, rpt 3 yrs (Vanga, Elson Halon, MD)   LEG SURGERY  x5   left - after fall at work (3.5 stories)   MANDIBLE SURGERY  1965   jaw fracture - horse kick   MOHS SURGERY Right 07/03/2021   Ear  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   Procedure: PACEMAKER IMPLANT;  Surgeon: Lei Pump, MD;  Location: [facility];  Service: Cardiovascular;  Laterality: N/A;   TRANSFORAMINAL LUMBAR INTERBODY FUSION W/ MIS 1 LEVEL N/A 11/27/2022   Procedure: L4-5 MINIMALLY INVASIVE (MIS) TRANSFORAMINAL LUMBAR INTERBODY FUSION (TLIF);  Surgeon: Jodeen Munch, MD;  Location: [facility] ORS;  Service: Neurosurgery;  Laterality: N/A;   TYMPANIC MEMBRANE REPAIR Left 1994   otosclerosis   Family History  Problem Relation Age of Onset   Cancer Mother        rectal   Hypertension Mother    Cancer Father 17       prostate   Cancer Paternal Uncle        prostate   Diabetes Neg Hx    Stroke Neg Hx    CAD Neg Hx    Social History   Tobacco Use   Smoking status: Former    Current packs/day: 0.00    Average packs/day: 1 pack/day for 4.0 years (4.0 ttl pk-yrs)    Types: Pipe, Cigarettes    Start date: 07/27/1974    Quit date: 07/27/1978    Years since quitting: 45.4   Smokeless tobacco: Never  Substance Use Topics   Alcohol use:  No    Alcohol/week: 0.0 standard drinks of alcohol   LABS:  Lab Results  Component Value Date   WBC 7.7 10/25/2023   HGB 14.1 10/25/2023   HCT 41.3 10/25/2023   MCV 89.9 10/25/2023   PLT 174.0 10/25/2023   Lab Results  Component Value Date   NA 139 10/25/2023   CL 102 10/25/2023   K 4.2 10/25/2023   CO2 28 10/25/2023   BUN 24 (H) 10/25/2023   CREATININE 1.19 10/25/2023   GFR 60.86 10/25/2023   CALCIUM 9.3 10/25/2023   ALBUMIN 4.4 11/16/2022   GLUCOSE 106 (H) 10/25/2023    ECG: Date: 08/30/2023  Time ECG obtained: 1030 AM Rate: 64 bpm Rhythm: Atrial paced rhythm with prolonged AV conduction; RBBB Axis (leads I and aVF): normal Intervals: PR 234 ms. QRS 160 ms. QTc 435 ms. ST segment and T wave changes: Lateral T wave abnormality.  Evidence of a possible, age undetermined, prior infarct:  No Comparison: Similar to previous tracing obtained on 02/28/2022   IMAGING / PROCEDURES: CT LUMBAR SPINE WO CONTRAST performed on 12/24/2023 Status post left facetectomy, discectomy and fusion at L4-5. The central canal and foramina appear open at this level. Hardware is intact. No evidence of loosening or other complicating feature. Moderate to moderately severe compression of the thecal sac at L3-4 by disc and epidural fat. Mild right foraminal narrowing also noted at this level. Mild central canal and right foraminal narrowing at L2-3. Aortic atherosclerosis  MR LUMBAR SPINE WO CONTRAST performed on 11/18/2023 Operative changes of anterior and posterior fusion at L4-L5. Moderate to severe canal stenoses at L3-L4 and L4-L5 secondary to disc bulging and facet arthropathy. Moderate foraminal stenoses bilaterally at L4-L5.  MR CERVICAL SPINE WO CONTRAST performed on 11/18/2023 Mild canal stenosis at C3-C4, C5-C6 and C6-C7. Severe bilateral foraminal stenosis at C6-C7.  Moderate foraminal stenosis on the left at C3-C4 and bilaterally at C5-C6.  DIAGNOSTIC RADIOGRAPHS OF CHEST 2  VIEWS performed on 11/16/2022 There is stable dual lead AICD positioning.  The cardiac silhouette is mildly enlarged and unchanged in size.  There is mild, stable prominence of the perihilar pulmonary vasculature without overt edema.  There is no evidence of an acute infiltrate, pleural effusion  or pneumothorax.  The visualized skeletal structures are unremarkable.   TRANSTHORACIC ECHOCARDIOGRAM performed on 02/26/2022 Left ventricular ejection fraction, by estimation, is 60 to 65%. The left ventricle has normal function. The left ventricle has no regional wall motion abnormalities. Left ventricular diastolic parameters are consistent with Grade II diastolic dysfunction (pseudonormalization).  Right ventricular systolic function is normal. The right ventricular size is moderately enlarged. There is normal pulmonary artery systolic pressure.  Left atrial size was moderately dilated.  Right atrial size was severely dilated.  The mitral valve is normal in structure. No evidence of mitral valve regurgitation. No evidence of mitral stenosis.  The aortic valve is tricuspid. Aortic valve regurgitation is trivial. Aortic valve sclerosis/calcification is present, without any evidence of  aortic stenosis.  The inferior vena cava is normal in size with greater than 50% respiratory variability, suggesting right atrial pressure of 3 mmHg.    IMPRESSION AND PLAN: ODES LOLLI has been referred for pre-anesthesia review and clearance prior to him undergoing the planned anesthetic and procedural courses. Available labs, pertinent testing, and imaging results were personally reviewed by me in preparation for upcoming operative/procedural course. [facility] medical record has been updated following extensive record review and patient interview with PAT staff.   ATTENTION --> PENDING CLEARANCE AT THIS TIME -- NOTE/CONTENTS NOT FINAL UNTIL SIGNED This patient has been appropriately cleared by cardiology  with an overall *** risk of patient experiencing significant perioperative cardiovascular complications. Based on clinical review performed today (01/14/24), barring any significant acute changes in the patient's overall condition, it is anticipated that he will be able to proceed with the planned surgical intervention. Any acute changes in clinical condition may necessitate his procedure being postponed and/or cancelled. Patient will meet with anesthesia team (MD and/or CRNA) on the day of his procedure for preoperative evaluation/assessment. Questions regarding anesthetic course will be fielded at that time.   Pre-surgical instructions were reviewed with the patient during his PAT appointment, and questions were fielded to satisfaction by PAT clinical staff. He has been instructed on which medications that he will need to hold prior to surgery, as well as the ones that have been deemed safe/appropriate to take on the day of his procedure. As part of the general education provided by PAT, patient made aware both verbally and in writing, that he would need to abstain from the use of any illegal substances during his perioperative course. He was advised that failure to follow the provided instructions could necessitate case cancellation or result in serious perioperative complications up to and including death. Patient encouraged to contact PAT and/or his surgeon's office to discuss any questions or concerns that may arise prior to surgery; verbalized understanding.   Renate Caroline, MSN, APRN, FNP-C, CEN [facility]  Perioperative Services Nurse Practitioner Phone: [phone] Fax: [phone] 01/14/24 1:47 PM  NOTE: This note has been prepared using Dragon dictation software. Despite my best ability to proofread, there is always the potential that unintentional transcriptional errors may still occur from this process.

## 2024-01-17 ENCOUNTER — Ambulatory Visit: Attending: Cardiology | Admitting: Cardiology

## 2024-01-17 ENCOUNTER — Encounter: Payer: Self-pay | Admitting: Cardiology

## 2024-01-17 ENCOUNTER — Ambulatory Visit: Admitting: Urology

## 2024-01-17 ENCOUNTER — Encounter: Payer: Self-pay | Admitting: Urology

## 2024-01-17 VITALS — BP 130/80 | HR 74 | Ht 67.0 in | Wt 220.0 lb

## 2024-01-17 VITALS — BP 158/80 | HR 68 | Ht 67.0 in | Wt 220.0 lb

## 2024-01-17 DIAGNOSIS — Z01810 Encounter for preprocedural cardiovascular examination: Secondary | ICD-10-CM | POA: Diagnosis not present

## 2024-01-17 DIAGNOSIS — R351 Nocturia: Secondary | ICD-10-CM

## 2024-01-17 DIAGNOSIS — I495 Sick sinus syndrome: Secondary | ICD-10-CM | POA: Diagnosis not present

## 2024-01-17 DIAGNOSIS — I10 Essential (primary) hypertension: Secondary | ICD-10-CM

## 2024-01-17 DIAGNOSIS — R361 Hematospermia: Secondary | ICD-10-CM | POA: Diagnosis not present

## 2024-01-17 NOTE — Progress Notes (Signed)
  Electrophysiology Office Note:   Date:  01/17/2024  ID:  Randy Kennedy, DOB [birth year], MRN [id number]  Primary Cardiologist: Evalene Lunger, MD Primary Heart Failure: None Electrophysiologist: Oslo Huntsman Gladis Norton, MD      History of Present Illness:   Randy Kennedy is a 73 y.o. male with h/o hypertension, hyperlipidemia, obesity, sleep apnea, symptomatic bradycardia seen today for routine electrophysiology followup.   Since last being seen in our clinic the patient reports doing overall well.  He has no chest pain.  He does have chronic baseline shortness of breath.  He finds it somewhat difficult to ambulate due to back pain.  He has upcoming carpal tunnel surgery.  he denies chest pain, palpitations, dyspnea, PND, orthopnea, nausea, vomiting, dizziness, syncope, edema, weight gain, or early satiety.   Review of systems complete and found to be negative unless listed in HPI.      EP Information / Studies Reviewed:    EKG is not ordered today. EKG from 08/30/2023 reviewed which showed atrial paced, right bundle branch block      PPM Interrogation-  reviewed in detail today,  See PACEART report.  Device History: Abbott Dual Chamber PPM implanted 02/27/2022 for Sinus Node Dysfunction  Risk Assessment/Calculations:             Physical Exam:   VS:  There were no vitals taken for this visit.   Wt Readings from Last 3 Encounters:  01/17/24 220 lb (99.8 kg)  01/10/24 220 lb (99.8 kg)  01/05/24 238 lb (108 kg)     GEN: Well nourished, well developed in no acute distress NECK: No JVD; No carotid bruits CARDIAC: Regular rate and rhythm, no murmurs, rubs, gallops RESPIRATORY:  Clear to auscultation without rales, wheezing or rhonchi  ABDOMEN: Soft, non-tender, non-distended EXTREMITIES:  No edema; No deformity   ASSESSMENT AND PLAN:    SND s/p Abbott PPM  Normal PPM function See Pace Art report Symptoms, threshold, impedance within normal limits Programming  reviewed and appropriate No changes today  2.  Hypertension:  afternoon dose of hydralazine .  Was well-controlled this morning.elevated today.  He has not taken his afternoon dose of hydralazine .  Was well-controlled this morning.  3.  Preoperative evaluation: Patient has chronic shortness of breath but no chest pain.  His activity is limited due to his back pain.  He has plans for upcoming carpal tunnel surgery.  Ejection fraction was normal in 2023.  He would be at intermediate risk for an intermediate risk procedure.  No further cardiac testing is necessary at this time.  Disposition:   Follow up with EP APP 24 months   Signed, Jnaya Butrick Gladis Norton, MD

## 2024-01-17 NOTE — Progress Notes (Signed)
 PERIOPERATIVE PRESCRIPTION FOR IMPLANTED CARDIAC DEVICE PROGRAMMING  Patient Information: Name:  Randy Kennedy  DOB:  1951-07-18  MRN:  [id number]  Planned Procedure: MIS LEFT CARPAL TUNNEL RELEASE WITH ULTRASOUND, POSSIBLE CONVERSION TO OPEN     Surgeon:  Dr. Penne Sharps, MD  Requesting device clearance: Dorise Pereyra, FNP-C  Date of Procedure:  01/18/2024  Cautery will be used.   Device Information:  Clinic EP Physician:  Soyla Norton, MD   Device Type:  Pacemaker Manufacturer and Phone #:  [employer] Jude/Abbott: [phone] Pacemaker Dependent?:  No.  Intermittently dependent.  AS/VS 37 today. Date of Last Device Check:  01/17/2024 Normal Device Function?:  Yes.    Electrophysiologist's Recommendations:  Have magnet available. Provide continuous ECG monitoring when magnet is used or reprogramming is to be performed.  Procedure may interfere with device function.  Magnet should be placed over device during procedure.  Per Device Clinic Standing Orders, [name], RN  4:35 PM 01/17/2024

## 2024-01-17 NOTE — Progress Notes (Signed)
 01/17/2024 9:53 AM   Randy Kennedy [birth year] [id number]  Referring provider: Rilla Baller, MD [address] [place],  [place] [address]  Chief Complaint  Patient presents with   Other    Hematospermia     HPI: Randy Kennedy is a 73 y.o. male referred for evaluation of hematospermia.  At his 10/25/2023 visit with Dr. Rilla was complaining of blood in the semen He states he noted this twice over a 3-53-month.  And describe the semen appearance as pink-tinged streaks Denied gross hematuria; urinalysis was normal PSA 10/25/2023 was stable at 0.44 His only voiding complaint is nocturia x 6.  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 11/2011 normal stress test performed in [place]  - see chart.   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    a.) s/p LEFT TM repair in ~1994   Heartburn    mild, controlled with pepcid    History of bilateral cataract extraction 11/2020   History of chicken pox    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 moderate, AHI 37.4 increased to 80 in REM, on BiPAP 14/10, 97% compliance >4 hrs (06/2013); b. Does not tolerate CPAP.   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 Treated with vicodin, prior tried fentanyl  and butrans.   Sinus node dysfunction (HCC)    a.) s/p Abbott Assurity MRI dual-chamber pacemaker placement 02/27/2022   Spondylolisthesis of lumbar region    Systolic murmur    Thrombocytopenia (HCC)    Wears dentures    Partial lower   Wears hearing aid in both ears    Does not wear all the time    Surgical History: Past  Surgical History:  Procedure Laterality Date   APPLICATION OF INTRAOPERATIVE CT SCAN N/A 11/27/2022   Procedure: APPLICATION OF INTRAOPERATIVE CT SCAN;  Surgeon: Clois Fret, MD;  Location: [facility];  Service: Neurosurgery;  Laterality: N/A;   CATARACT EXTRACTION W/PHACO Right 11/26/2020   Procedure: CATARACT EXTRACTION PHACO AND INTRAOCULAR LENS PLACEMENT (IOC) RIGHT VIVITY TORIC LENS 7.17 00:49.6;  Surgeon: Jaye Fallow, MD;  Location: [facility];  Service: OphthalmologY   CATARACT EXTRACTION W/PHACO Left 12/10/2020   Procedure: CATARACT EXTRACTION PHACO AND INTRAOCULAR LENS PLACEMENT (IOC) LEFT VIVITY;  Surgeon: Jaye Fallow, MD;  Location: [facility];  Service: Ophthalmology; LENS 5.3700:32.7   COLONOSCOPY  02/2011   ext hem, benign polyp, rpt 5 yrs ([place] )   COLONOSCOPY WITH PROPOFOL  N/A  12/17/2020   multiple TA, ext hem, rpt 3 yrs ([place], Corinn Skiff, MD)   LEG SURGERY  x5   left - after fall at work (3.5 stories)   MANDIBLE SURGERY  1965   jaw fracture - horse kick   MOHS SURGERY Right 07/03/2021   Ear   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   Procedure: PACEMAKER IMPLANT;  Surgeon: Inocencio Soyla Lunger, MD;  Location: [facility];  Service: Cardiovascular;  Laterality: N/A;   TRANSFORAMINAL LUMBAR INTERBODY FUSION W/ MIS 1 LEVEL N/A 11/27/2022   Procedure: L4-5 MINIMALLY INVASIVE (MIS) TRANSFORAMINAL LUMBAR INTERBODY FUSION (TLIF);  Surgeon: Clois Fret, MD;  Location: [facility];  Service: Neurosurgery;  Laterality: N/A;   TYMPANIC MEMBRANE REPAIR Left 1994   otosclerosis    Home Medications:  Allergies as of 01/17/2024       Reactions   Amlodipine  Rash   Rash in groin   Allopurinol Other (See Comments)   GI upset   Lisinopril -hydrochlorothiazide     Burning in feet.   Procardia  [nifedipine ] Other (See Comments)   Bradycardia, leg swelling   Clonidine  Derivatives Rash   Doxazosin  Mesylate Rash   Latex Rash   Nortriptyline  Rash   Uloric  [febuxostat ] Rash        Medication List        Accurate as of January 17, 2024  9:53 AM. If you have any questions, ask your nurse or doctor.          ascorbic acid 500 MG tablet Commonly known as: VITAMIN C Take 500 mg by mouth daily.   aspirin  EC 81 MG tablet Take 81 mg by mouth daily.   carvedilol  25 MG tablet Commonly known as: COREG  Take 1 tablet (25 mg total) by mouth 2 (two) times daily with a meal.   colchicine  0.6 MG tablet TAKE ONE TABLET BY MOUTH ONCE DAILY What changed:  when to take this reasons to take this   fexofenadine 180 MG tablet Commonly known as: ALLEGRA Take 180 mg by mouth every morning.   fluticasone  50 MCG/ACT nasal spray Commonly known as: FLONASE  SPRAY 2 SPRAYS INTO EACH NOSTRIL EVERY DAY What changed: See the new instructions.   gabapentin  300 MG  capsule Commonly known as: NEURONTIN  TAKE 1 CAPSULE BY MOUTH TWICE A DAY AND 2 CAPSULES AT BEDTIME What changed: See the new instructions.   hydrALAZINE  100 MG tablet Commonly known as: APRESOLINE  Take 1 tablet (100 mg total) by mouth 3 (three) times daily.   minoxidil  2.5 MG tablet Commonly known as: LONITEN  TAKE 1 TABLET BY MOUTH DAILY AT BEDTIME   montelukast  10 MG tablet Commonly known as: SINGULAIR  TAKE 1 TABLET BY MOUTH EVERYDAY AT BEDTIME   nitroGLYCERIN  0.4 MG SL  tablet Commonly known as: NITROSTAT  Place 1 tablet (0.4 mg total) under the tongue every 5 (five) minutes as needed for chest pain.   pravastatin  40 MG tablet Commonly known as: PRAVACHOL  Take 1 tablet (40 mg total) by mouth daily. What changed: when to take this   probenecid  500 MG tablet Commonly known as: BENEMID  TAKE 1/2 TABLET TWICE A DAY BY MOUTH   spironolactone  25 MG tablet Commonly known as: ALDACTONE  Take 1 tablet (25 mg total) by mouth daily.   valsartan  320 MG tablet Commonly known as: DIOVAN  Take 1 tablet (320 mg total) by mouth daily.        Allergies:  Allergies  Allergen Reactions   Amlodipine  Rash    Rash in groin   Allopurinol Other (See Comments)    GI upset   Lisinopril -Hydrochlorothiazide      Burning in feet.   Procardia  [Nifedipine ] Other (See Comments)    Bradycardia, leg swelling   Clonidine  Derivatives Rash   Doxazosin  Mesylate Rash   Latex Rash   Nortriptyline  Rash   Uloric  [Febuxostat ] Rash    Family History: Family History  Problem Relation Age of Onset   Cancer Mother        rectal   Hypertension Mother    Cancer Father 89       prostate   Cancer Paternal Uncle        prostate   Diabetes Neg Hx    Stroke Neg Hx    CAD Neg Hx     Social History:  reports that he quit smoking about 45 years ago. His smoking use included pipe and cigarettes. He started smoking about 49 years ago. He has a 4 pack-year smoking history. He has never used smokeless  tobacco. He reports that he does not drink alcohol and does not use drugs.   Physical Exam: BP 130/80   Pulse 74   Ht 5' 7 (1.702 m)   Wt 220 lb (99.8 kg)   BMI 34.46 kg/m   Constitutional:  Alert and oriented, No acute distress. HEENT: [place] AT Respiratory: Normal respiratory effort, no increased work of breathing. GU: Prostate 35 g, smooth without nodules Psychiatric: Normal mood and affect.    Assessment & Plan:    1.  Hematospermia Resolved He had a normal UA, low and stable PSA and benign DRE We discussed hematospermia is a common condition and rarely indicative of significant pathology He will call for recurrent/increasing hematospermia  2.  Nocturia We discussed untreated sleep apnea is a common cause of nocturia   Randy JAYSON Barba, MD  [facility] [address], [address] [place], [place] [address] [phone]

## 2024-01-17 NOTE — Telephone Encounter (Signed)
 He is cleared by cardiology to proceed tomorrow.

## 2024-01-17 NOTE — Progress Notes (Signed)
 Remote pacemaker transmission.

## 2024-01-18 ENCOUNTER — Encounter: Payer: Self-pay | Admitting: Neurosurgery

## 2024-01-18 ENCOUNTER — Ambulatory Visit
Admission: RE | Admit: 2024-01-18 | Discharge: 2024-01-18 | Disposition: A | Discharge status: Home / Self Care | Attending: Neurosurgery | Admitting: Neurosurgery

## 2024-01-18 ENCOUNTER — Other Ambulatory Visit: Payer: Self-pay

## 2024-01-18 ENCOUNTER — Ambulatory Visit: Payer: Self-pay | Admitting: Urgent Care

## 2024-01-18 ENCOUNTER — Encounter
Admission: RE | Disposition: A | Discharge status: Home / Self Care | Payer: Self-pay | Source: Home / Self Care | Attending: Neurosurgery

## 2024-01-18 DIAGNOSIS — M1990 Unspecified osteoarthritis, unspecified site: Secondary | ICD-10-CM | POA: Diagnosis not present

## 2024-01-18 DIAGNOSIS — G8929 Other chronic pain: Secondary | ICD-10-CM | POA: Insufficient documentation

## 2024-01-18 DIAGNOSIS — R29898 Other symptoms and signs involving the musculoskeletal system: Secondary | ICD-10-CM | POA: Diagnosis present

## 2024-01-18 DIAGNOSIS — Z950 Presence of cardiac pacemaker: Secondary | ICD-10-CM | POA: Insufficient documentation

## 2024-01-18 DIAGNOSIS — G4730 Sleep apnea, unspecified: Secondary | ICD-10-CM | POA: Insufficient documentation

## 2024-01-18 DIAGNOSIS — Z87891 Personal history of nicotine dependence: Secondary | ICD-10-CM | POA: Insufficient documentation

## 2024-01-18 DIAGNOSIS — I509 Heart failure, unspecified: Secondary | ICD-10-CM | POA: Diagnosis not present

## 2024-01-18 DIAGNOSIS — G5603 Carpal tunnel syndrome, bilateral upper limbs: Secondary | ICD-10-CM | POA: Diagnosis not present

## 2024-01-18 DIAGNOSIS — K219 Gastro-esophageal reflux disease without esophagitis: Secondary | ICD-10-CM | POA: Insufficient documentation

## 2024-01-18 DIAGNOSIS — G5602 Carpal tunnel syndrome, left upper limb: Secondary | ICD-10-CM | POA: Diagnosis not present

## 2024-01-18 DIAGNOSIS — Z01818 Encounter for other preprocedural examination: Secondary | ICD-10-CM

## 2024-01-18 DIAGNOSIS — I110 Hypertensive heart disease with heart failure: Secondary | ICD-10-CM | POA: Diagnosis not present

## 2024-01-18 HISTORY — DX: Polyneuropathy, unspecified: G62.9

## 2024-01-18 HISTORY — DX: Unspecified malignant neoplasm of skin of right ear and external auricular canal: C44.202

## 2024-01-18 HISTORY — DX: Unspecified right bundle-branch block: I45.10

## 2024-01-18 HISTORY — DX: Long term (current) use of aspirin: Z79.82

## 2024-01-18 HISTORY — PX: CARPAL TUNNEL RELEASE: SHX101

## 2024-01-18 HISTORY — DX: Carpal tunnel syndrome, unspecified upper limb: G56.00

## 2024-01-18 LAB — CUP PACEART INCLINIC DEVICE CHECK
Battery Remaining Longevity: 103 mo
Battery Voltage: 3.01 V
Brady Statistic RA Percent Paced: 99.68 %
Brady Statistic RV Percent Paced: 2.2 %
Date Time Interrogation Session: 20250623144500
Implantable Lead Connection Status: 753985
Implantable Lead Connection Status: 753985
Implantable Lead Implant Date: 20230804
Implantable Lead Implant Date: 20230804
Implantable Lead Location: 753859
Implantable Lead Location: 753860
Implantable Pulse Generator Implant Date: 20230804
Lead Channel Impedance Value: 450 Ohm
Lead Channel Impedance Value: 525 Ohm
Lead Channel Pacing Threshold Amplitude: 0.75 V
Lead Channel Pacing Threshold Amplitude: 0.75 V
Lead Channel Pacing Threshold Amplitude: 1 V
Lead Channel Pacing Threshold Amplitude: 1 V
Lead Channel Pacing Threshold Pulse Width: 0.5 ms
Lead Channel Pacing Threshold Pulse Width: 0.5 ms
Lead Channel Pacing Threshold Pulse Width: 0.5 ms
Lead Channel Pacing Threshold Pulse Width: 0.5 ms
Lead Channel Sensing Intrinsic Amplitude: 1.5 mV
Lead Channel Sensing Intrinsic Amplitude: 12 mV
Lead Channel Setting Pacing Amplitude: 1.125
Lead Channel Setting Pacing Amplitude: 2.375
Lead Channel Setting Pacing Pulse Width: 0.5 ms
Lead Channel Setting Sensing Sensitivity: 2 mV
Pulse Gen Model: 2272
Pulse Gen Serial Number: 8101269

## 2024-01-18 SURGERY — CARPAL TUNNEL RELEASE
Anesthesia: General | Site: Wrist | Laterality: Left

## 2024-01-18 MED ORDER — PROPOFOL 500 MG/50ML IV EMUL
INTRAVENOUS | Status: DC | PRN
  Administered 2024-01-18: 75 ug/kg/min via INTRAVENOUS

## 2024-01-18 MED ORDER — MIDAZOLAM HCL 2 MG/2ML IJ SOLN
INTRAMUSCULAR | Status: DC | PRN
  Administered 2024-01-18: 2 mg via INTRAVENOUS

## 2024-01-18 MED ORDER — LIDOCAINE HCL (CARDIAC) PF 100 MG/5ML IV SOSY
PREFILLED_SYRINGE | INTRAVENOUS | Status: DC | PRN
  Administered 2024-01-18: 50 mg via INTRAVENOUS

## 2024-01-18 MED ORDER — ACETAMINOPHEN 10 MG/ML IV SOLN: 1000.0000 mg | Freq: Once | INTRAVENOUS | Status: DC | PRN

## 2024-01-18 MED ORDER — FENTANYL CITRATE (PF) 100 MCG/2ML IJ SOLN: 25.0000 ug | INTRAMUSCULAR | Status: DC | PRN

## 2024-01-18 MED ORDER — PROPOFOL 10 MG/ML IV BOLUS
INTRAVENOUS | Status: AC
  Filled 2024-01-18: qty 20

## 2024-01-18 MED ORDER — ACETAMINOPHEN 10 MG/ML IV SOLN
INTRAVENOUS | Status: AC
  Filled 2024-01-18: qty 100

## 2024-01-18 MED ORDER — 0.9 % SODIUM CHLORIDE (POUR BTL) OPTIME
TOPICAL | Status: DC | PRN
  Administered 2024-01-18: 500 mL

## 2024-01-18 MED ORDER — ACETAMINOPHEN 10 MG/ML IV SOLN
INTRAVENOUS | Status: DC | PRN
  Administered 2024-01-18: 1000 mg via INTRAVENOUS

## 2024-01-18 MED ORDER — ORAL CARE MOUTH RINSE: 15.0000 mL | Freq: Once | OROMUCOSAL | Status: AC

## 2024-01-18 MED ORDER — LACTATED RINGERS IV SOLN: INTRAVENOUS | Status: DC

## 2024-01-18 MED ORDER — CEFAZOLIN SODIUM-DEXTROSE 2-4 GM/100ML-% IV SOLN
INTRAVENOUS | Status: AC
  Filled 2024-01-18: qty 100

## 2024-01-18 MED ORDER — DEXAMETHASONE SODIUM PHOSPHATE 10 MG/ML IJ SOLN
INTRAMUSCULAR | Status: AC
  Filled 2024-01-18: qty 1

## 2024-01-18 MED ORDER — DROPERIDOL 2.5 MG/ML IJ SOLN: 0.6250 mg | Freq: Once | INTRAMUSCULAR | Status: DC | PRN

## 2024-01-18 MED ORDER — SEVOFLURANE IN SOLN
RESPIRATORY_TRACT | Status: AC
  Filled 2024-01-18: qty 250

## 2024-01-18 MED ORDER — FENTANYL CITRATE (PF) 100 MCG/2ML IJ SOLN
INTRAMUSCULAR | Status: DC | PRN
  Administered 2024-01-18: 25 ug via INTRAVENOUS

## 2024-01-18 MED ORDER — CHLORHEXIDINE GLUCONATE 0.12 % MT SOLN
OROMUCOSAL | Status: AC
  Filled 2024-01-18: qty 15

## 2024-01-18 MED ORDER — OXYCODONE HCL 5 MG/5ML PO SOLN: 5.0000 mg | Freq: Once | ORAL | Status: AC | PRN

## 2024-01-18 MED ORDER — OXYCODONE HCL 5 MG PO TABS
5.0000 mg | ORAL_TABLET | Freq: Once | ORAL | Status: AC | PRN
  Administered 2024-01-18: 5 mg via ORAL

## 2024-01-18 MED ORDER — DEXAMETHASONE SODIUM PHOSPHATE 10 MG/ML IJ SOLN
INTRAMUSCULAR | Status: DC | PRN
Start: 2024-01-18 — End: 2024-01-18
  Administered 2024-01-18: 10 mg via INTRAVENOUS

## 2024-01-18 MED ORDER — FENTANYL CITRATE (PF) 100 MCG/2ML IJ SOLN
INTRAMUSCULAR | Status: AC
Start: 2024-01-18 — End: 2024-01-18
  Filled 2024-01-18: qty 2

## 2024-01-18 MED ORDER — LIDOCAINE HCL (PF) 1 % IJ SOLN
INTRAMUSCULAR | Status: AC
Start: 2024-01-18 — End: 2024-01-18
  Filled 2024-01-18: qty 30

## 2024-01-18 MED ORDER — LIDOCAINE HCL 1 % IJ SOLN
INTRAMUSCULAR | Status: DC | PRN
  Administered 2024-01-18: 5 mL

## 2024-01-18 MED ORDER — LIDOCAINE HCL (PF) 2 % IJ SOLN
INTRAMUSCULAR | Status: AC
  Filled 2024-01-18: qty 5

## 2024-01-18 MED ORDER — HYDROCODONE-ACETAMINOPHEN 5-325 MG PO TABS: 1.0000 | ORAL_TABLET | Freq: Four times a day (QID) | ORAL | 0 refills | Status: AC | PRN

## 2024-01-18 MED ORDER — ONDANSETRON HCL 4 MG/2ML IJ SOLN
INTRAMUSCULAR | Status: AC
  Filled 2024-01-18: qty 2

## 2024-01-18 MED ORDER — MIDAZOLAM HCL 2 MG/2ML IJ SOLN
INTRAMUSCULAR | Status: AC
  Filled 2024-01-18: qty 2

## 2024-01-18 MED ORDER — OXYCODONE HCL 5 MG PO TABS
ORAL_TABLET | ORAL | Status: AC
  Filled 2024-01-18: qty 1

## 2024-01-18 MED ORDER — DOCUSATE SODIUM 100 MG PO CAPS: 100.0000 mg | ORAL_CAPSULE | Freq: Every day | ORAL | 0 refills | Status: AC | PRN

## 2024-01-18 MED ORDER — CHLORHEXIDINE GLUCONATE 0.12 % MT SOLN
15.0000 mL | Freq: Once | OROMUCOSAL | Status: AC
  Administered 2024-01-18: 15 mL via OROMUCOSAL

## 2024-01-18 MED ORDER — CEFAZOLIN SODIUM-DEXTROSE 2-4 GM/100ML-% IV SOLN
2.0000 g | Freq: Once | INTRAVENOUS | Status: AC
  Administered 2024-01-18: 2 g via INTRAVENOUS

## 2024-01-18 SURGICAL SUPPLY — 21 items
BNDG ADH 1X3 SHEER STRL LF (GAUZE/BANDAGES/DRESSINGS) ×1 IMPLANT
BNDG ADH 2 X3.75 FABRIC TAN LF (GAUZE/BANDAGES/DRESSINGS) IMPLANT
BRUSH SCRUB EZ 4% CHG (MISCELLANEOUS) ×1 IMPLANT
CHLORAPREP W/TINT 26 (MISCELLANEOUS) ×1 IMPLANT
COVER PROBE FLX POLY STRL (MISCELLANEOUS) ×1 IMPLANT
DERMABOND ADVANCED .7 DNX12 (GAUZE/BANDAGES/DRESSINGS) ×1 IMPLANT
DRAPE EXTREMITY 106X87X128.5 (DRAPES) ×1 IMPLANT
DRAPE IMP U-DRAPE 54X76 (DRAPES) ×1 IMPLANT
DRAPE SHEET LG 3/4 BI-LAMINATE (DRAPES) ×1 IMPLANT
GLOVE BIOGEL PI IND STRL 8 (GLOVE) ×1 IMPLANT
GLOVE SRG 8 PF TXTR STRL LF DI (GLOVE) ×1 IMPLANT
GLOVE SURG SYN 7.5 E (GLOVE) ×1 IMPLANT
GLOVE SURG SYN 7.5 PF PI (GLOVE) ×1 IMPLANT
GOWN SRG XL LVL 3 NONREINFORCE (GOWNS) ×1 IMPLANT
KIT TURNOVER KIT A (KITS) ×1 IMPLANT
NDL HYPO 25X1 1.5 SAFETY (NEEDLE) ×1 IMPLANT
NEEDLE HYPO 25X1 1.5 SAFETY (NEEDLE) ×1 IMPLANT
NS IRRIG 500ML POUR BTL (IV SOLUTION) ×1 IMPLANT
PACK BASIN MINOR ARMC (MISCELLANEOUS) ×1 IMPLANT
STOCKINETTE IMPERVIOUS 9X36 MD (GAUZE/BANDAGES/DRESSINGS) ×1 IMPLANT
ULTRAGLIDE CTR (BLADE) ×1 IMPLANT

## 2024-01-18 NOTE — Anesthesia Postprocedure Evaluation (Signed)
 Anesthesia Post Note  Patient: Randy Kennedy  Procedure(s) Performed: CARPAL TUNNEL RELEASE (Left: Wrist)  Patient location during evaluation: PACU Anesthesia Type: General Level of consciousness: awake and alert Pain management: pain level controlled Vital Signs Assessment: post-procedure vital signs reviewed and stable Respiratory status: spontaneous breathing, nonlabored ventilation and respiratory function stable Cardiovascular status: blood pressure returned to baseline and stable Postop Assessment: no apparent nausea or vomiting Anesthetic complications: no   No notable events documented.   Last Vitals:  Vitals:   01/18/24 0845 01/18/24 0900  BP: 136/83 132/81  Pulse: (!) 59 (!) 59  Resp: 16 15  Temp:    SpO2: 95% 94%    Last Pain:  Vitals:   01/18/24 0900  TempSrc:   PainSc: 0-No pain                 Camellia Merilee Louder

## 2024-01-18 NOTE — Anesthesia Procedure Notes (Signed)
 Procedure Name: MAC Date/Time: 01/18/2024 7:16 AM  Performed by: Lorrene Camelia LABOR, CRNAPre-anesthesia Checklist: Patient identified, Emergency Drugs available, Suction available and Patient being monitored Patient Re-evaluated:Patient Re-evaluated prior to induction Oxygen Delivery Method: Simple face mask Preoxygenation: Pre-oxygenation with 100% oxygen Induction Type: IV induction

## 2024-01-18 NOTE — Discharge Instructions (Signed)
 Your surgeon has performed an operation on the nerves in your upper extremity. Many times, patients feel better immediately after surgery and can "overdo it." Even if you feel well, it is important that you follow these activity guidelines. If you do not let your nerve heal properly from the surgery, you can increase the chance of return of your symptoms. The following are instructions to help in your recovery once you have been discharged from the hospital.  It is normal for your nerves to feel increased "tingling" after surgery or a sensation that the nerve is "waking up".  This generally will resolve in a short period of time, within 1 to 2 weeks.  * It is ok to take NSAIDs after surgery.  Activity    Increase physical activity slowly as tolerated.  Taking short walks is encouraged, but avoid strenuous exercise. Do not jog, run, bicycle, lift weights, or participate in any other exercises unless specifically allowed by your doctor. Avoid prolonged sitting, including car rides.  For the first few days after surgery, avoid any use of the extremity more than a glass of water or for eating.  Specific instructions were given to you based off of your specific procedure.  You should not drive until no longer taking any new pain medication.  You may shower three days after your surgery.  After showering, lightly dab your incision dry. Do not take a tub bath or go swimming for 3 weeks, or until approved by your doctor at your follow-up appointment.  If you smoke, we strongly recommend that you quit.  Smoking has been proven to interfere with normal healing in your back and will dramatically reduce the success rate of your surgery. Please contact [facility] (800-QUIT-NOW) and use the resources at www.[facility].com for assistance in stopping smoking.  Surgical Incision   If you have a dressing on your incision, you may remove it three days after your surgery. Keep your incision area clean and dry.  Your  sutures are under your skin and your wound is covered with surgical glue.  The glue should begin to peel away within about a week.   Diet            You may return to your usual diet. Be sure to stay hydrated.  When to Contact us  Although your surgery and recovery will likely be uneventful, you may have some residual numbness, aches, and pains in your back and/or legs. This is normal and should improve in the next few weeks.  However, should you experience any of the following, contact us immediately: New numbness or weakness Pain that is progressively getting worse, and is not relieved by your pain medications or rest Bleeding, redness, swelling, pain, or drainage from surgical incision Chills or flu-like symptoms Fever greater than 101.0 F (38.3 C) Problems with bowel or bladder functions Difficulty breathing or shortness of breath Warmth, tenderness, or swelling in your calf  Contact Information How to contact us:  If you have any questions/concerns before or after surgery, you can reach [place] at [phone], or you can send a mychart message. We can be reached by phone or mychart 8am-4pm, Monday-Friday.  *Please note: Calls after 4pm are forwarded to a third party answering service. Mychart messages are not routinely monitored during evenings, weekends, and holidays. Please call our office to contact the answering service for urgent concerns during non-business hours.

## 2024-01-18 NOTE — Interval H&P Note (Signed)
 History and Physical Interval Note:  01/18/2024 7:04 AM  Randy Kennedy  has presented today for surgery, with the diagnosis of G56.03 Bilateral carpal tunnel syndrome R29.898 Hand weakness.  The various methods of treatment have been discussed with the patient and family. After consideration of risks, benefits and other options for treatment, the patient has consented to  Procedure(s) with comments: CARPAL TUNNEL RELEASE (Left) - MIS LEFT CARPAL TUNNELTUNNEL RELEASE WITH ULTRASOUND, POSSIBLE CONVERTION TO OPEN as a surgical intervention.  The patient's history has been reviewed, patient examined, no change in status, stable for surgery.  I have reviewed the patient's chart and labs.  Questions were answered to the patient's satisfaction.    Heart and ungs clear    Randy Kennedy

## 2024-01-18 NOTE — Transfer of Care (Signed)
 Immediate Anesthesia Transfer of Care Note  Patient: Randy Kennedy  Procedure(s) Performed: CARPAL TUNNEL RELEASE (Left: Wrist)  Patient Location: PACU  Anesthesia Type:General  Level of Consciousness: drowsy and patient cooperative  Airway & Oxygen Therapy: Patient Spontanous Breathing and Patient connected to face mask oxygen  Post-op Assessment: Report given to RN and Post -op Vital signs reviewed and stable  Post vital signs: Reviewed and stable  Last Vitals:  Vitals Value Taken Time  BP 104/65 01/18/24 07:39  Temp 36.2 C 01/18/24 07:39  Pulse 59 01/18/24 07:39  Resp 16 01/18/24 07:39  SpO2 99 % 01/18/24 07:39  Vitals shown include unfiled device data.  Last Pain:  Vitals:   01/18/24 0739  TempSrc:   PainSc: 0-No pain         Complications: No notable events documented.

## 2024-01-18 NOTE — Op Note (Signed)
 Indications: Patient with a history of median neuropathy at the wrist with hand weakness refractory to conservative management.  Findings: Severe compression of the median nerve at the transverse carpal ligament  Preoperative Diagnosis:G56.03 Bilateral carpal tunnel syndrome R29.898 Hand weakness  Postoperative Diagnosis: G56.03 Bilateral carpal tunnel syndromeR29.898 Hand weakness   Postoperative Diagnosis: same   EBL: Minimal IVF: See anesthesia report Drains: none Disposition:Stable to PACU Complications: none  No foley catheter was placed.   Preoperative Note: patient with a history of progressive left median neuropathy with hand weakness refractory to conservative management.  They had tried rest, padding, and watchful waiting but had continued progressive symptoms.  Given the progression of her median neuropathy plan was made for median nerve decompression  Risk of surgery is discussed and include: Infection, bleeding, wound healing issues, pillar pain nerve injury, pain, failure to relieve the symptoms, need for further surgery.  Procedure:  1) left sided carpal tunnel decompression with ultrasound guidance   Procedure: After obtaining informed consent, the patient taken to the operating room, placed in supine position, monitored anesthesia care was induced.  They were given preoperative antibiotics.  Prepped and draped in the usual fashion.  Comprehensive timeout was performed verifying the patient's name, MRN, planned procedure.  An ultrasound was used with a sterile probe.  We used this to mark out our safety points including the interval between the ulnar artery and median nerve.  We identified the motor branch as well as the first sensory branch which were both in safe position.  We also identified the vascular arcade which was in safe positioning as well.  At this point we placed a block with Marcaine  without epinephrine .  We blocked the skin where the incision would be,  the superficial sensory median nerve, as well as the transverse carpal ligament.  Under ultrasound guidance we utilized the local anesthetic to perform a hydrodissection of the nerve from the transverse carpal ligament.  We then prepped the sonexs ultra CTR knife on the back table while the anesthetic set in.  We performed a small linear incision approximately 2 to 3 mm.  We then utilized a Statistician under ultrasound guidance to identify the underside of the transverse carpal ligament.  The fat and connective tissue was dissected off the underside.  We could feel that this was quite thickened and calcified.  Causing severe compression.  Once we had a clear tract we then placed the ultrasound-guided knife into the incision and advanced it through the carpal tunnel.  We verified the safety zones including the median nerve which did not significantly cross over the knife.  We are able to see the first sensory branch which was not crossing the knife.  The artery was also in a safe place.  At this point we divided the transverse carpal ligament under ultrasound guidance, to get a full release it took 2 passes.  The nerve relaxed laterally and was well decompressed.  After the 2 passes we placed a [place] 4 into the wound and were able to feel a complete dissection of the transverse carpal ligament.  We then irrigated, we got meticulous hemostasis.  Skin glue was placed on the incision and a Band-Aid was placed on top once this was dried.  No immediate complications.  Sponge and pattie counts were correct at the end of the procedure.   I performed the procedure without an assistant surgeon  Penne MICAEL Sharps, MD/MSCR

## 2024-01-18 NOTE — Anesthesia Preprocedure Evaluation (Addendum)
 Anesthesia Evaluation  Patient identified by MRN, date of birth, ID band Patient awake    Reviewed: Allergy & Precautions, H&P , NPO status , Patient's Chart, lab work & pertinent test results  Airway Mallampati: III  TM Distance: >3 FB Neck ROM: full    Dental no notable dental hx.    Pulmonary sleep apnea , former smoker   Pulmonary exam normal        Cardiovascular Exercise Tolerance: Good hypertension, +CHF (Diastolic dysfunction)  Normal cardiovascular exam+ dysrhythmias (Sinus node dysfunction) + pacemaker ([employer] Assurity MRI dual-chamber pacemaker)      Neuro/Psych  Neuromuscular disease  negative psych ROS   GI/Hepatic negative GI ROS, Neg liver ROS,,,  Endo/Other  negative endocrine ROS    Renal/GU negative Renal ROS  negative genitourinary   Musculoskeletal   Abdominal  (+) + obese  Peds  Hematology negative hematology ROS (+)   Anesthesia Other Findings Past Medical History: No date: Allergic rhinitis No date: Arthritis No date: Bradycardia No date: 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 11/2011 normal stress test performed in [place]  - see               chart. 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     Comment:  a.) s/p LEFT TM repair in ~1994 No date: Heartburn     Comment:  mild,  controlled with pepcid  11/2020: History of bilateral cataract extraction No date: History of chicken pox No date: History of hepatitis B     Comment:  as child (17 years of age), no sequelae No date: HLD (hyperlipidemia) No date: HTN (hypertension)     Comment:  difficult to control - clonidine  and beta blockers               caused bradycardia No date: Leg edema 05/13/2022: Lobar pneumonia (HCC) No date: Long-term use of aspirin  therapy No date: Morbid obesity (HCC) No date: Motion sickness     Comment:  Most moving vehicles 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 moderate, AHI 37.4 increased to 80 in REM, on BiPAP               14/10, 97% compliance >4 hrs (06/2013); b. Does not               tolerate CPAP. No date: Peripheral neuropathy No date: Pneumonia 02/27/2022: Presence of cardiac pacemaker     Comment:  a.) [employer] Assurity MRI dual-chamber pacemaker (SN:               [id number] ) No date: RBBB No date: RBBB (right bundle  branch block) No date: RLS (restless legs syndrome) 12/14/2014: RSD lower limb     Comment:  Reviewed prior workup (saw pain management at [facility], [place]): chronic back pain with RLS + RSD               L leg with severe L post-traumatic ankle joint arthosis,               no mention of peripheral neuropathy. Treated with               vicodin, prior tried fentanyl  and butrans. No date: Sinus node dysfunction (HCC)     Comment:  a.) s/p Abbott Assurity MRI dual-chamber pacemaker               placement 02/27/2022 No date: Spondylolisthesis of lumbar region No date: Systolic murmur No date: Thrombocytopenia (HCC) No date: Wears dentures     Comment:  Partial lower No date: Wears hearing aid in both ears     Comment:  Does not wear all the time  Past Surgical History: 11/27/2022: APPLICATION OF INTRAOPERATIVE CT SCAN; N/A      Comment:  Procedure: APPLICATION OF INTRAOPERATIVE CT SCAN;                Surgeon: Clois Fret, MD;  Location: [facility];                Service: Neurosurgery;  Laterality: N/A; 11/26/2020: CATARACT EXTRACTION W/PHACO; Right     Comment:  Procedure: CATARACT EXTRACTION PHACO AND INTRAOCULAR               LENS PLACEMENT (IOC) RIGHT VIVITY TORIC LENS 7.17               00:49.6;  Surgeon: Jaye Fallow, MD;  Location:               [facility];  Service: OphthalmologY 12/10/2020: CATARACT EXTRACTION W/PHACO; Left     Comment:  Procedure: CATARACT EXTRACTION PHACO AND INTRAOCULAR               LENS PLACEMENT (IOC) LEFT VIVITY;  Surgeon: Jaye Fallow, MD;  Location: [facility];  Service:               Ophthalmology; LENS 5.3700:32.7 02/2011: COLONOSCOPY     Comment:  ext hem, benign polyp, rpt 5 yrs ([place] ) 12/17/2020: COLONOSCOPY WITH PROPOFOL ; N/A     Comment:  multiple TA, ext hem, rpt 3 yrs ([place], Corinn Skiff,               MD) x5: LEG SURGERY     Comment:  left - after fall at work (3.5 stories) 1965: MANDIBLE SURGERY     Comment:  jaw fracture - horse kick 07/03/2021: MOHS SURGERY; Right     Comment:  Ear 02/27/2022: PACEMAKER IMPLANT; N/A     Comment:  Procedure: PACEMAKER IMPLANT;  Surgeon: Inocencio Soyla Lunger, MD;  Location: [facility];  Service:               Cardiovascular;  Laterality: N/A; 11/27/2022: TRANSFORAMINAL LUMBAR INTERBODY FUSION W/ MIS 1 LEVEL; N/A     Comment:  Procedure: L4-5 MINIMALLY INVASIVE (MIS) TRANSFORAMINAL  LUMBAR INTERBODY FUSION (TLIF);  Surgeon: Clois Fret, MD;  Location: [facility];  Service: Neurosurgery;              Laterality: N/A; 1994: TYMPANIC MEMBRANE REPAIR; Left     Comment:  otosclerosis     Reproductive/Obstetrics negative OB ROS                             Anesthesia Physical Anesthesia Plan  ASA:  3  Anesthesia Plan: General   Post-op Pain Management: Minimal or no pain anticipated and Regional block*   Induction: Intravenous  PONV Risk Score and Plan: Propofol  infusion and TIVA  Airway Management Planned: Natural Airway  Additional Equipment:   Intra-op Plan:   Post-operative Plan:   Informed Consent: I have reviewed the patients History and Physical, chart, labs and discussed the procedure including the risks, benefits and alternatives for the proposed anesthesia with the patient or authorized representative who has indicated his/her understanding and acceptance.     Dental Advisory Given  Plan Discussed with: CRNA and Surgeon  Anesthesia Plan Comments:         Anesthesia Quick Evaluation

## 2024-01-18 NOTE — Progress Notes (Signed)
 While in PACU, patient was noted to have bleeding from surgical site, changed saturated bandage, and notified Dr. Penne, he applied more dermabond. This RN applied another bandage and wrapped loosely with Kerlix and paper tape once dermabond was dried/ set.

## 2024-01-19 ENCOUNTER — Encounter: Payer: Self-pay | Admitting: Neurosurgery

## 2024-01-19 ENCOUNTER — Ambulatory Visit: Payer: Self-pay | Admitting: Cardiology

## 2024-01-20 ENCOUNTER — Encounter: Payer: Self-pay | Admitting: Urgent Care

## 2024-01-21 ENCOUNTER — Ambulatory Visit: Admitting: Neurology

## 2024-01-21 DIAGNOSIS — R202 Paresthesia of skin: Secondary | ICD-10-CM | POA: Diagnosis not present

## 2024-01-21 DIAGNOSIS — M5416 Radiculopathy, lumbar region: Secondary | ICD-10-CM

## 2024-01-21 DIAGNOSIS — G629 Polyneuropathy, unspecified: Secondary | ICD-10-CM

## 2024-01-21 NOTE — Procedures (Signed)
 [facility]  [address], [address]  [place], [place] [address] Tel: [phone] Fax: [phone] Test Date:  01/21/2024  Patient: Randy Kennedy DOB: 1951/07/17 Physician: Tonita Blanch, DO  Sex: Male Height: 5' 7 Ref Phys: Glade Boys, DEVONNA  ID#: [id number]   Technician:    History: This is a 73 year old man s/p L4-L5 TLIF/PSF referred for evaluation of left leg pain and weakness.  NCV & EMG Findings: Extensive electrodiagnostic testing of the left lower extremity and additional studies of the right shows:  Bilateral sural and superficial peroneal sensory responses are absent. Bilateral peroneal motor responses are absent at the extensor digitorum brevis, and normal at the tibialis anterior.  Bilateral tibial motor responses show reduced amplitude. Tibial H reflex study on the right is prolonged, and normal on the left. Chronic motor axonal loss changes are seen affecting the anterior tibialis, flexor digitorum longus, rectus femoris, and adductor longus muscles, worse on the left.  There is no evidence of accompanying active denervation.   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   ___________________________ Tonita Blanch, DO    Nerve Conduction Studies   Stim Site NR Peak (ms) Norm Peak (ms) O-P Amp (V) Norm O-P Amp  Left Sup Peroneal Anti Sensory (Ant Lat Mall)  32 C  12 cm *NR  <4.6  >3  Right Sup Peroneal Anti Sensory (Ant Lat Mall)  32 C  12 cm *NR  <4.6  >3  Left Sural Anti Sensory (Lat Mall)  32 C  Calf *NR  <4.6  >3  Right Sural Anti Sensory (Lat Mall)  32 C  Calf *NR  <4.6  >3     Stim Site NR Onset (ms) Norm Onset (ms) O-P Amp (mV) Norm O-P Amp Site1 Site2 Delta-0 (ms) Dist (cm) Vel (m/s) Norm Vel (m/s)  Left Peroneal Motor (Ext Dig Brev)  32 C  Ankle *NR  <6.0  >2.5 B Fib Ankle  0.0  >40  B  Fib *NR     Poplt B Fib  0.0  >40  Poplt *NR            Right Peroneal Motor (Ext Dig Brev)  32 C  Ankle *NR  <6.0  >2.5 B Fib Ankle  0.0  >40  B Fib *NR     Poplt B Fib  0.0  >40  Poplt *NR            Left Peroneal TA Motor (Tib Ant)  32 C  Fib Head    3.8 <4.5 4.5 >3 Poplit Fib Head 1.4 8.0 57 >40  Poplit    5.2 <5.7 4.4         Right Peroneal TA Motor (Tib Ant)  32 C  Fib Head    2.4 <4.5 5.9 >3 Poplit Fib Head 1.4 8.0 57 >40  Poplit    3.8 <5.7 5.6         Left Tibial Motor (Abd Hall Brev)  32 C  Ankle    4.2 <6.0 *2.9 >4 Knee Ankle 8.2 41.0 50 >40  Knee    12.4  2.8         Right Tibial Motor (Abd Hall Brev)  32 C  Ankle    3.4 <6.0 *1.8 >4 Knee Ankle 10.0 45.0 45 >40  Knee    13.4  1.3          Electromyography  Side Muscle Ins.Act Fibs Fasc Recrt Amp Dur Poly Activation Comment  Right AntTibialis Nml Nml Nml *1- *1+ *1+ *1+ Nml N/A  Right Gastroc Nml Nml Nml Nml Nml Nml Nml Nml N/A  Right Flex Dig Long Nml Nml Nml *1- *1+ *1+ *1+ Nml N/A  Right RectFemoris Nml Nml Nml *2- *1+ *1+ *1+ Nml N/A  Right AdductorLong Nml Nml Nml *1- *1+ *1+ *1+ Nml N/A  Right GluteusMed Nml Nml Nml Nml Nml Nml Nml Nml N/A  Right Iliopsoas Nml Nml Nml Nml Nml Nml Nml Nml N/A  Left Gastroc Nml Nml Nml Nml Nml Nml Nml Nml N/A  Left Flex Dig Long Nml Nml Nml *2- *1+ *1+ *1+ Nml N/A  Left RectFemoris Nml Nml Nml *2- *1+ *1+ *1+ Nml N/A  Left GluteusMed Nml Nml Nml Nml Nml Nml Nml Nml N/A  Left AdductorLong Nml Nml Nml Nml Nml Nml Nml Nml N/A  Left BicepsFemS Nml Nml Nml Nml Nml Nml Nml Nml N/A  Left AntTibialis Nml Nml Nml *3- *1+ *1+ *1+ Nml N/A      Waveforms:

## 2024-01-28 NOTE — Progress Notes (Unsigned)
   REFERRING PHYSICIAN:  Rilla Baller, Md [address] [place],  [place] [address]  DOS: 01/18/24  left sided carpal tunnel decompression with ultrasound guidance   HISTORY OF PRESENT ILLNESS: Randy Kennedy is 2 status post above surgery. Given norco 5 on discharge from the hospital.   He has minimal pain in his left palm. Tingling and numbness is improved. He would like to schedule right side.    PHYSICAL EXAMINATION:  NEUROLOGICAL:  General: In no acute distress.   Awake, alert, oriented to person, place, and time.  Pupils equal round and reactive to light.  Facial tone is symmetric.    Incision c/d/I  Good ROM of hand, he can make a full fist. No gross weakness in hand. Sensation intact to light touch with good capillary refill.   Imaging:  Nothing new to review.   Assessment / Plan: Randy Kennedy is doing well s/p above surgery. Treatment options reviewed with patient and following plan made:   - Reviewed wound care.  - He can slowly return to activity as tolerated.  - Discussed with Dr. Claudene, will work on scheduling right carpal tunnel release and will call him.  - Follow up as scheduled with Dr. Claudene in 4 weeks.   Of note, he also has f/u at end of the month with Dr. Clois to discuss his lumbar CT scan and EMG.   Advised to contact the office if any questions or concerns arise.   Glade Boys PA-C Dept of Neurosurgery

## 2024-01-29 ENCOUNTER — Other Ambulatory Visit: Payer: Self-pay | Admitting: Family Medicine

## 2024-01-29 DIAGNOSIS — E785 Hyperlipidemia, unspecified: Secondary | ICD-10-CM

## 2024-01-29 DIAGNOSIS — Z125 Encounter for screening for malignant neoplasm of prostate: Secondary | ICD-10-CM

## 2024-01-29 DIAGNOSIS — D696 Thrombocytopenia, unspecified: Secondary | ICD-10-CM

## 2024-01-29 DIAGNOSIS — R208 Other disturbances of skin sensation: Secondary | ICD-10-CM

## 2024-01-29 DIAGNOSIS — M1A09X0 Idiopathic chronic gout, multiple sites, without tophus (tophi): Secondary | ICD-10-CM

## 2024-01-31 ENCOUNTER — Ambulatory Visit (INDEPENDENT_AMBULATORY_CARE_PROVIDER_SITE_OTHER): Admitting: Orthopedic Surgery

## 2024-01-31 ENCOUNTER — Encounter: Admitting: Physician Assistant

## 2024-01-31 ENCOUNTER — Encounter: Payer: Self-pay | Admitting: Orthopedic Surgery

## 2024-01-31 ENCOUNTER — Other Ambulatory Visit

## 2024-01-31 ENCOUNTER — Encounter: Payer: Self-pay | Admitting: Neurosurgery

## 2024-01-31 ENCOUNTER — Other Ambulatory Visit (INDEPENDENT_AMBULATORY_CARE_PROVIDER_SITE_OTHER)

## 2024-01-31 ENCOUNTER — Telehealth: Payer: Self-pay

## 2024-01-31 VITALS — BP 128/72 | Temp 98.0°F | Ht 67.0 in | Wt 230.0 lb

## 2024-01-31 DIAGNOSIS — R208 Other disturbances of skin sensation: Secondary | ICD-10-CM

## 2024-01-31 DIAGNOSIS — G5602 Carpal tunnel syndrome, left upper limb: Secondary | ICD-10-CM

## 2024-01-31 DIAGNOSIS — Z125 Encounter for screening for malignant neoplasm of prostate: Secondary | ICD-10-CM

## 2024-01-31 DIAGNOSIS — M1A09X0 Idiopathic chronic gout, multiple sites, without tophus (tophi): Secondary | ICD-10-CM | POA: Diagnosis not present

## 2024-01-31 DIAGNOSIS — G5601 Carpal tunnel syndrome, right upper limb: Secondary | ICD-10-CM

## 2024-01-31 DIAGNOSIS — E785 Hyperlipidemia, unspecified: Secondary | ICD-10-CM | POA: Diagnosis not present

## 2024-01-31 DIAGNOSIS — D696 Thrombocytopenia, unspecified: Secondary | ICD-10-CM | POA: Diagnosis not present

## 2024-01-31 DIAGNOSIS — Z09 Encounter for follow-up examination after completed treatment for conditions other than malignant neoplasm: Secondary | ICD-10-CM

## 2024-01-31 DIAGNOSIS — Z9889 Other specified postprocedural states: Secondary | ICD-10-CM

## 2024-01-31 LAB — LIPID PANEL
Cholesterol: 170 mg/dL (ref 0–200)
HDL: 57.5 mg/dL (ref >39.00)
LDL Cholesterol: 63 mg/dL (ref 0–99)
NonHDL: 112.67
Total CHOL/HDL Ratio: 3
Triglycerides: 247 mg/dL — ABNORMAL HIGH (ref 0.0–149.0)
VLDL: 49.4 mg/dL — ABNORMAL HIGH (ref 0.0–40.0)

## 2024-01-31 LAB — CBC WITH DIFFERENTIAL/PLATELET
Basophils Absolute: 0.1 K/uL (ref 0.0–0.1)
Basophils Relative: 1 % (ref 0.0–3.0)
Eosinophils Absolute: 0.2 K/uL (ref 0.0–0.7)
Eosinophils Relative: 3.1 % (ref 0.0–5.0)
HCT: 36.4 % — ABNORMAL LOW (ref 39.0–52.0)
Hemoglobin: 12.7 g/dL — ABNORMAL LOW (ref 13.0–17.0)
Lymphocytes Relative: 25.5 % (ref 12.0–46.0)
Lymphs Abs: 2 K/uL (ref 0.7–4.0)
MCHC: 34.8 g/dL (ref 30.0–36.0)
MCV: 90.3 fl (ref 78.0–100.0)
Monocytes Absolute: 0.7 K/uL (ref 0.1–1.0)
Monocytes Relative: 8.6 % (ref 3.0–12.0)
Neutro Abs: 4.8 K/uL (ref 1.4–7.7)
Neutrophils Relative %: 61.8 % (ref 43.0–77.0)
Platelets: 192 K/uL (ref 150.0–400.0)
RBC: 4.04 Mil/uL — ABNORMAL LOW (ref 4.22–5.81)
RDW: 13 % (ref 11.5–15.5)
WBC: 7.7 K/uL (ref 4.0–10.5)

## 2024-01-31 LAB — URIC ACID: Uric Acid, Serum: 7.1 mg/dL (ref 4.0–7.8)

## 2024-01-31 LAB — COMPREHENSIVE METABOLIC PANEL WITH GFR
ALT: 18 U/L (ref 0–53)
AST: 21 U/L (ref 0–37)
Albumin: 4.3 g/dL (ref 3.5–5.2)
Alkaline Phosphatase: 41 U/L (ref 39–117)
BUN: 22 mg/dL (ref 6–23)
CO2: 29 meq/L (ref 19–32)
Calcium: 9.1 mg/dL (ref 8.4–10.5)
Chloride: 103 meq/L (ref 96–112)
Creatinine, Ser: 1.12 mg/dL (ref 0.40–1.50)
GFR: 65.33 mL/min (ref >60.00)
Glucose, Bld: 116 mg/dL — ABNORMAL HIGH (ref 70–99)
Potassium: 4.4 meq/L (ref 3.5–5.1)
Sodium: 139 meq/L (ref 135–145)
Total Bilirubin: 0.9 mg/dL (ref 0.2–1.2)
Total Protein: 6.9 g/dL (ref 6.0–8.3)

## 2024-01-31 LAB — PSA: PSA: 0.35 ng/mL (ref 0.10–4.00)

## 2024-01-31 LAB — VITAMIN B12: Vitamin B-12: 782 pg/mL (ref 211–911)

## 2024-01-31 NOTE — Telephone Encounter (Signed)
 Left Message for patient to call back to schedule his R CTR with Dr. Claudene

## 2024-02-01 ENCOUNTER — Ambulatory Visit: Payer: Self-pay | Admitting: Family Medicine

## 2024-02-07 ENCOUNTER — Encounter: Payer: Self-pay | Admitting: Family Medicine

## 2024-02-07 ENCOUNTER — Ambulatory Visit (INDEPENDENT_AMBULATORY_CARE_PROVIDER_SITE_OTHER): Admitting: Family Medicine

## 2024-02-07 VITALS — BP 138/72 | HR 60 | Temp 98.3°F | Ht 67.0 in | Wt 242.1 lb

## 2024-02-07 DIAGNOSIS — M5442 Lumbago with sciatica, left side: Secondary | ICD-10-CM

## 2024-02-07 DIAGNOSIS — M5441 Lumbago with sciatica, right side: Secondary | ICD-10-CM | POA: Diagnosis not present

## 2024-02-07 DIAGNOSIS — M1A09X0 Idiopathic chronic gout, multiple sites, without tophus (tophi): Secondary | ICD-10-CM

## 2024-02-07 DIAGNOSIS — Z0000 Encounter for general adult medical examination without abnormal findings: Secondary | ICD-10-CM | POA: Diagnosis not present

## 2024-02-07 DIAGNOSIS — E785 Hyperlipidemia, unspecified: Secondary | ICD-10-CM

## 2024-02-07 DIAGNOSIS — G5603 Carpal tunnel syndrome, bilateral upper limbs: Secondary | ICD-10-CM

## 2024-02-07 DIAGNOSIS — Z7189 Other specified counseling: Secondary | ICD-10-CM

## 2024-02-07 DIAGNOSIS — G894 Chronic pain syndrome: Secondary | ICD-10-CM | POA: Diagnosis not present

## 2024-02-07 DIAGNOSIS — J3089 Other allergic rhinitis: Secondary | ICD-10-CM

## 2024-02-07 DIAGNOSIS — G90522 Complex regional pain syndrome I of left lower limb: Secondary | ICD-10-CM

## 2024-02-07 DIAGNOSIS — D696 Thrombocytopenia, unspecified: Secondary | ICD-10-CM

## 2024-02-07 DIAGNOSIS — Z950 Presence of cardiac pacemaker: Secondary | ICD-10-CM

## 2024-02-07 DIAGNOSIS — Z0001 Encounter for general adult medical examination with abnormal findings: Secondary | ICD-10-CM

## 2024-02-07 DIAGNOSIS — I10 Essential (primary) hypertension: Secondary | ICD-10-CM

## 2024-02-07 DIAGNOSIS — G8929 Other chronic pain: Secondary | ICD-10-CM

## 2024-02-07 DIAGNOSIS — M5416 Radiculopathy, lumbar region: Secondary | ICD-10-CM | POA: Diagnosis not present

## 2024-02-07 DIAGNOSIS — G4733 Obstructive sleep apnea (adult) (pediatric): Secondary | ICD-10-CM

## 2024-02-07 DIAGNOSIS — S81802A Unspecified open wound, left lower leg, initial encounter: Secondary | ICD-10-CM | POA: Insufficient documentation

## 2024-02-07 MED ORDER — FLUTICASONE PROPIONATE 50 MCG/ACT NA SUSP: NASAL | 3 refills | Status: DC

## 2024-02-07 MED ORDER — MONTELUKAST SODIUM 10 MG PO TABS: 10.0000 mg | ORAL_TABLET | Freq: Every day | ORAL | 3 refills | Status: DC

## 2024-02-07 MED ORDER — COLCHICINE 0.6 MG PO TABS: 0.6000 mg | ORAL_TABLET | Freq: Every day | ORAL | 3 refills | Status: DC | PRN

## 2024-02-07 MED ORDER — GABAPENTIN 300 MG PO CAPS: ORAL_CAPSULE | ORAL | 3 refills | Status: DC

## 2024-02-07 MED ORDER — PROBENECID 500 MG PO TABS: ORAL_TABLET | ORAL | 3 refills | Status: DC

## 2024-02-07 NOTE — Assessment & Plan Note (Signed)
 Preventative protocols reviewed and updated unless pt declined. Discussed healthy diet and lifestyle.

## 2024-02-07 NOTE — Assessment & Plan Note (Signed)
 Given prolonged nature of wound and poor healing will refer to wound clinic - pt does not have location preference.

## 2024-02-07 NOTE — Assessment & Plan Note (Signed)
 Has seen PM&R, on chronic narcotic.  Continues gabapentin  300 bid with 600-900 at night.

## 2024-02-07 NOTE — Assessment & Plan Note (Signed)
 Sees [facility] EP

## 2024-02-07 NOTE — Assessment & Plan Note (Addendum)
 Discussed weight gain trend. He may be interested in bariatric medication - recommend 4-6 wks of calorie deficit diet then return for f/u, discussion on weight loss medication options.  Obesity complicated by comorbidities of HTN, HLD, gout, OA

## 2024-02-07 NOTE — Progress Notes (Signed)
 Ph: [phone] Fax: [phone]   Patient ID: Randy Kennedy, male    DOB: 11/15/1950, 73 y.o.   MRN: [id number]  This visit was conducted in person.  BP 138/72   Pulse 60   Temp 98.3 F (36.8 C) (Oral)   Ht 5' 7 (1.702 m)   Wt 242 lb 2 oz (109.8 kg)   SpO2 98%   BMI 37.92 kg/m    CC: CPE  Subjective:   HPI: Randy Kennedy is a 73 y.o. male presenting on 02/07/2024 for Annual Exam   Saw health advisor 09/2023 for medicare wellness visit. Note reviewed.   No results found.  Flowsheet Row Office Visit from 10/25/2023 in [facility] [place]  PHQ-2 Total Score 1       10/25/2023   10:48 AM 10/25/2023   10:23 AM 10/04/2023   11:38 AM 06/14/2023   12:19 PM 11/16/2022    9:45 AM  Fall Risk   Falls in the past year? 1 0 0 1 1  Number falls in past yr: 0  0 0 1  Injury with Fall?   0 0 0  Risk for fall due to :   No Fall Risks Other (Comment)   Follow up   Education provided;Falls prevention discussed Falls evaluation completed     Recently completed L CTS release, pending R side.  Poorly healing wound to left lateral ankle - present for 5 months. Has been using neosporin antibiotic ointment  Preventative: COLONOSCOPY WITH PROPOFOL  12/17/2020 - multiple TA, ext hem, rpt 3 yrs (Vanga, Corinn Skiff, MD) - discussed due for repeat Prostate cancer screening - rec screening given fmhx (father and uncle) - sees Contractor yearly.  Lung cancer screening - doesn't qualify  Flu shot - has not completed recently  COVID vaccine J&J 10/2019, no booster Td - 2014 Prevnar-13 2018, pneumovax 06/2018  Shingles shot - 04/2013  Shingrix - discussed  Advanced directive: scanned 10/2022. Wife [name] is HCPOA. Does not want prolonged life support if terminal condition. Follow health care agent. Previously discussed would be full code.  Seat belt use discussed Sunscreen use and skin screen discussed. No changing moles on skin.  Ex smoker quit 1980 Alcohol -  rare Dentist - every 1-2 years  Eye exam yearly  Bowel - no constipation  Bladder - no incontinence   Caffeine: 2 cans coke/day Lives with wife and grown son   Has 6 children and 18 grandchildren  Occupation: retired, was Administrator, arts.    On disability for chronic pain Edu: MBA Activity: no regular exercise due to pain Deit: good water , fruits/vegetables daily     Relevant past medical, surgical, family and social history reviewed and updated as indicated. Interim medical history since our last visit reviewed. Allergies and medications reviewed and updated. Outpatient Medications Prior to Visit  Medication Sig Dispense Refill   aspirin  EC 81 MG tablet Take 81 mg by mouth daily.     carvedilol  (COREG ) 25 MG tablet Take 1 tablet (25 mg total) by mouth 2 (two) times daily with a meal. 180 tablet 3   fexofenadine (ALLEGRA) 180 MG tablet Take 180 mg by mouth every morning.     hydrALAZINE  (APRESOLINE ) 100 MG tablet Take 1 tablet (100 mg total) by mouth 3 (three) times daily. 270 tablet 3   minoxidil  (LONITEN ) 2.5 MG tablet TAKE 1 TABLET BY MOUTH DAILY AT BEDTIME 90 tablet 2   nitroGLYCERIN  (NITROSTAT ) 0.4 MG SL tablet  Place 1 tablet (0.4 mg total) under the tongue every 5 (five) minutes as needed for chest pain. 25 tablet 4   pravastatin  (PRAVACHOL ) 40 MG tablet Take 1 tablet (40 mg total) by mouth daily. (Patient taking differently: Take 40 mg by mouth at bedtime.) 90 tablet 3   spironolactone  (ALDACTONE ) 25 MG tablet Take 1 tablet (25 mg total) by mouth daily. 90 tablet 3   valsartan  (DIOVAN ) 320 MG tablet Take 1 tablet (320 mg total) by mouth daily. 90 tablet 3   vitamin C (ASCORBIC ACID) 500 MG tablet Take 500 mg by mouth daily.     colchicine  0.6 MG tablet TAKE ONE TABLET BY MOUTH ONCE DAILY (Patient taking differently: Take 0.6 mg by mouth daily as needed (gout attacks).) 90 tablet 3   fluticasone  (FLONASE ) 50 MCG/ACT nasal spray SPRAY 2 SPRAYS INTO EACH NOSTRIL EVERY DAY  (Patient taking differently: 1 spray as needed.) 48 mL 0   gabapentin  (NEURONTIN ) 300 MG capsule TAKE 1 CAPSULE BY MOUTH TWICE A DAY AND 2 CAPSULES AT BEDTIME (Patient taking differently: Take 900-1,200 mg by mouth at bedtime.) 360 capsule 1   montelukast  (SINGULAIR ) 10 MG tablet TAKE 1 TABLET BY MOUTH EVERYDAY AT BEDTIME 90 tablet 0   probenecid  (BENEMID ) 500 MG tablet TAKE 1/2 TABLET TWICE A DAY BY MOUTH 90 tablet 1   No facility-administered medications prior to visit.     Per HPI unless specifically indicated in ROS section below Review of Systems  Constitutional:  Negative for activity change, appetite change, chills, fatigue, fever and unexpected weight change.  HENT:  Negative for hearing loss.   Eyes:  Negative for visual disturbance.  Respiratory:  Positive for wheezing (allergies). Negative for cough, chest tightness and shortness of breath.   Cardiovascular:  Positive for leg swelling (left chronic). Negative for chest pain and palpitations.  Gastrointestinal:  Negative for abdominal distention, abdominal pain, blood in stool, constipation, diarrhea, nausea and vomiting.  Genitourinary:  Negative for difficulty urinating and hematuria.  Musculoskeletal:  Negative for arthralgias, myalgias and neck pain.  Skin:  Negative for rash.  Neurological:  Negative for dizziness, seizures, syncope and headaches.  Hematological:  Negative for adenopathy. Does not bruise/bleed easily.  Psychiatric/Behavioral:  Negative for dysphoric mood. The patient is not nervous/anxious.     Objective:  BP 138/72   Pulse 60   Temp 98.3 F (36.8 C) (Oral)   Ht 5' 7 (1.702 m)   Wt 242 lb 2 oz (109.8 kg)   SpO2 98%   BMI 37.92 kg/m   Wt Readings from Last 3 Encounters:  02/07/24 242 lb 2 oz (109.8 kg)  01/31/24 230 lb (104.3 kg)  01/17/24 220 lb (99.8 kg)      Physical Exam Vitals and nursing note reviewed.  Constitutional:      General: He is not in acute distress.    Appearance: Normal  appearance. He is well-developed. He is not ill-appearing.     Comments: Ambulates with cane  HENT:     Head: Normocephalic and atraumatic.     Right Ear: Hearing, tympanic membrane, ear canal and external ear normal.     Left Ear: Hearing, tympanic membrane, ear canal and external ear normal.     Mouth/Throat:     Mouth: Mucous membranes are moist.     Pharynx: Oropharynx is clear. No oropharyngeal exudate or posterior oropharyngeal erythema.  Eyes:     General: No scleral icterus.    Extraocular Movements: Extraocular movements intact.  Conjunctiva/sclera: Conjunctivae normal.     Pupils: Pupils are equal, round, and reactive to light.  Neck:     Thyroid : No thyroid  mass or thyromegaly.     Vascular: No carotid bruit.  Cardiovascular:     Rate and Rhythm: Normal rate and regular rhythm.     Pulses: Normal pulses.          Radial pulses are 2+ on the right side and 2+ on the left side.     Heart sounds: Normal heart sounds. No murmur heard. Pulmonary:     Effort: Pulmonary effort is normal. No respiratory distress.     Breath sounds: Normal breath sounds. No wheezing, rhonchi or rales.  Abdominal:     General: Abdomen is protuberant. Bowel sounds are normal. There is no distension.     Palpations: Abdomen is soft. There is no mass.     Tenderness: There is no abdominal tenderness. There is no guarding or rebound.     Hernia: No hernia is present.  Musculoskeletal:        General: Normal range of motion.     Cervical back: Normal range of motion and neck supple.     Right lower leg: No edema.     Left lower leg: No edema.  Lymphadenopathy:     Cervical: No cervical adenopathy.  Skin:    General: Skin is warm and dry.     Findings: Wound present. No rash.     Comments: Left lateral ankle with poorly healing wound 1.3cm x0.9cm witout surrounding erythema  Neurological:     General: No focal deficit present.     Mental Status: He is alert and oriented to person, place, and  time.  Psychiatric:        Mood and Affect: Mood normal.        Behavior: Behavior normal.        Thought Content: Thought content normal.        Judgment: Judgment normal.        Results for orders placed or performed in visit on 01/31/24  Vitamin B12   Collection Time: 01/31/24 12:40 PM  Result Value Ref Range   Vitamin B-12 782 211 - 911 pg/mL  Uric acid   Collection Time: 01/31/24 12:40 PM  Result Value Ref Range   Uric Acid, Serum 7.1 4.0 - 7.8 mg/dL  PSA   Collection Time: 01/31/24 12:40 PM  Result Value Ref Range   PSA 0.35 0.10 - 4.00 ng/mL  CBC with Differential/Platelet   Collection Time: 01/31/24 12:40 PM  Result Value Ref Range   WBC 7.7 4.0 - 10.5 K/uL   RBC 4.04 (L) 4.22 - 5.81 Mil/uL   Hemoglobin 12.7 (L) 13.0 - 17.0 g/dL   HCT 63.5 (L) 60.9 - 47.9 %   MCV 90.3 78.0 - 100.0 fl   MCHC 34.8 30.0 - 36.0 g/dL   RDW 86.9 88.4 - 84.4 %   Platelets 192.0 Repeated and verified X2. 150.0 - 400.0 K/uL   Neutrophils Relative % 61.8 43.0 - 77.0 %   Lymphocytes Relative 25.5 12.0 - 46.0 %   Monocytes Relative 8.6 3.0 - 12.0 %   Eosinophils Relative 3.1 0.0 - 5.0 %   Basophils Relative 1.0 0.0 - 3.0 %   Neutro Abs 4.8 1.4 - 7.7 K/uL   Lymphs Abs 2.0 0.7 - 4.0 K/uL   Monocytes Absolute 0.7 0.1 - 1.0 K/uL   Eosinophils Absolute 0.2 0.0 - 0.7 K/uL   Basophils  Absolute 0.1 0.0 - 0.1 K/uL  Comprehensive metabolic panel with GFR   Collection Time: 01/31/24 12:40 PM  Result Value Ref Range   Sodium 139 135 - 145 mEq/L   Potassium 4.4 3.5 - 5.1 mEq/L   Chloride 103 96 - 112 mEq/L   CO2 29 19 - 32 mEq/L   Glucose, Bld 116 (H) 70 - 99 mg/dL   BUN 22 6 - 23 mg/dL   Creatinine, Ser 8.87 0.40 - 1.50 mg/dL   Total Bilirubin 0.9 0.2 - 1.2 mg/dL   Alkaline Phosphatase 41 39 - 117 U/L   AST 21 0 - 37 U/L   ALT 18 0 - 53 U/L   Total Protein 6.9 6.0 - 8.3 g/dL   Albumin 4.3 3.5 - 5.2 g/dL   GFR 34.66 >39.99 mL/min   Calcium 9.1 8.4 - 10.5 mg/dL  Lipid panel   Collection  Time: 01/31/24 12:40 PM  Result Value Ref Range   Cholesterol 170 0 - 200 mg/dL   Triglycerides 752.9 (H) 0.0 - 149.0 mg/dL   HDL 42.49 >60.99 mg/dL   VLDL 50.5 (H) 0.0 - 59.9 mg/dL   LDL Cholesterol 63 0 - 99 mg/dL   Total CHOL/HDL Ratio 3    NonHDL 112.67     Assessment & Plan:   Problem List Items Addressed This Visit     Encounter for general adult medical examination with abnormal findings - Primary (Chronic)   Preventative protocols reviewed and updated unless pt declined. Discussed healthy diet and lifestyle.       Advanced care planning/counseling discussion (Chronic)   Advanced directive: scanned 10/2022. Wife [name] is HCPOA. Does not want prolonged life support if terminal condition. Follow health care agent. Previously discussed would be full code.       HTN (hypertension)   Chronic difficult to control hypertension now followed by cardiology Dr Raford and pharmacy clinic appreciate their care. Latest BP readings well controlled-  continue current regimen.       Dyslipidemia   Chronic on pravastatin . Reviewed latest FLP, dietary recommendations provided.  The 10-year ASCVD risk score (Arnett DK, et al., 2019) is: 25.4%   Values used to calculate the score:     Age: 55 years     Clincally relevant sex: Male     Is Non-Hispanic African American: No     Diabetic: No     Tobacco smoker: No     Systolic Blood Pressure: 138 mmHg     Is BP treated: Yes     HDL Cholesterol: 57.5 mg/dL     Total Cholesterol: 170 mg/dL       OSA (obstructive sleep apnea)   Declines treatment of this.       Gout   Continues probenecid  500mg  1/2 tab bid and vit C 500mg  daily with PRN colchicine .  Urate levels trending up - but no recent gout flare noted Allopurinol intolerance.       Relevant Medications   probenecid  (BENEMID ) 500 MG tablet   RSD lower limb   Has seen PM&R, on chronic narcotic.  Continues gabapentin  300 bid with 600-900 at night.       Relevant Medications    gabapentin  (NEURONTIN ) 300 MG capsule   Thrombocytopenia (HCC)   Plt count now normal.       Severe obesity (BMI 35.0-39.9) with comorbidity (HCC)   Discussed weight gain trend. He may be interested in bariatric medication - recommend 4-6 wks of calorie deficit diet then return for  f/u, discussion on weight loss medication options.  Obesity complicated by comorbidities of HTN, HLD, gout, OA      Perennial allergic rhinitis   Continues flonase , singulair  and allegra      Relevant Medications   fluticasone  (FLONASE ) 50 MCG/ACT nasal spray   montelukast  (SINGULAIR ) 10 MG tablet   Chronic pain disorder   Relevant Medications   gabapentin  (NEURONTIN ) 300 MG capsule   Chronic radicular pain of lower back   Relevant Medications   gabapentin  (NEURONTIN ) 300 MG capsule   Status cardiac pacemaker   Sees [facility]      Chronic bilateral low back pain with bilateral sciatica   Relevant Medications   gabapentin  (NEURONTIN ) 300 MG capsule   Bilateral carpal tunnel syndrome   s/p L CTR this year, discussing possibly proceeding with R side      Relevant Medications   gabapentin  (NEURONTIN ) 300 MG capsule   Leg wound, left, initial encounter   Given prolonged nature of wound and poor healing will refer to wound clinic - pt does not have location preference.       Relevant Orders   Ambulatory referral to Wound Clinic     Meds ordered this encounter  Medications   fluticasone  (FLONASE ) 50 MCG/ACT nasal spray    Sig: SPRAY 2 SPRAYS INTO EACH NOSTRIL EVERY DAY    Dispense:  48 mL    Refill:  3   gabapentin  (NEURONTIN ) 300 MG capsule    Sig: Take 1 capsule (300 mg total) by mouth 2 (two) times daily AND 2-3 capsules (600-900 mg total) at bedtime.    Dispense:  480 capsule    Refill:  3   montelukast  (SINGULAIR ) 10 MG tablet    Sig: Take 1 tablet (10 mg total) by mouth at bedtime.    Dispense:  90 tablet    Refill:  3   probenecid  (BENEMID ) 500 MG tablet    Sig: TAKE 1/2  TABLET TWICE A DAY BY MOUTH    Dispense:  90 tablet    Refill:  3   colchicine  0.6 MG tablet    Sig: Take 1 tablet (0.6 mg total) by mouth daily as needed (gout flare).    Dispense:  30 tablet    Refill:  3    Orders Placed This Encounter  Procedures   Ambulatory referral to Wound Clinic    Referral Priority:   Routine    Referral Type:   Consultation    Referral Reason:   Specialty Services Required    Requested Specialty:   Wound Care    Number of Visits Requested:   1    Patient Instructions  Let us  know when ready for repeat colonoscopy as you're due - may need new referral - let me know where you'd like to go.  If interested, check with pharmacy about new 2 shot shingles series (shingrix).  I will refer you to wound clinic  Good to see you today  Return as needed or in 1 year for next physical  Return in 4-6 weeks for weight loss medication discussion  Follow up plan: Return in about 1 year (around 02/06/2025) for annual exam, prior fasting for blood work, medicare wellness visit.  Anton Blas, MD

## 2024-02-07 NOTE — Assessment & Plan Note (Addendum)
 Chronic difficult to control hypertension now followed by cardiology Dr Raford and pharmacy clinic appreciate their care. Latest BP readings well controlled-  continue current regimen.

## 2024-02-07 NOTE — Patient Instructions (Addendum)
 Let us  know when ready for repeat colonoscopy as you're due - may need new referral - let me know where you'd like to go.  If interested, check with pharmacy about new 2 shot shingles series (shingrix).  I will refer you to wound clinic  Good to see you today  Return as needed or in 1 year for next physical  Return in 4-6 weeks for weight loss medication discussion

## 2024-02-07 NOTE — Assessment & Plan Note (Deleted)
 Continues probenecid  500mg  1/2 tab bid and vit C 500mg  daily with PRN colchicine .  Urate levels trending up - but no recent gout flare noted Allopurinol intolerance.

## 2024-02-07 NOTE — Assessment & Plan Note (Addendum)
 Plt count now normal.

## 2024-02-07 NOTE — Assessment & Plan Note (Signed)
 Declines treatment of this.

## 2024-02-07 NOTE — Assessment & Plan Note (Signed)
 s/p L CTR this year, discussing possibly proceeding with R side

## 2024-02-07 NOTE — Assessment & Plan Note (Signed)
 Continues probenecid  500mg  1/2 tab bid and vit C 500mg  daily with PRN colchicine .  Urate levels trending up - but no recent gout flare noted Allopurinol intolerance.

## 2024-02-07 NOTE — Assessment & Plan Note (Signed)
 Advanced directive: scanned 10/2022. Wife [name] is HCPOA. Does not want prolonged life support if terminal condition. Follow health care agent. Previously discussed would be full code.

## 2024-02-07 NOTE — Assessment & Plan Note (Signed)
 Continues flonase , singulair  and allegra

## 2024-02-07 NOTE — Assessment & Plan Note (Signed)
 Chronic on pravastatin . Reviewed latest FLP, dietary recommendations provided.  The 10-year ASCVD risk score (Arnett DK, et al., 2019) is: 25.4%   Values used to calculate the score:     Age: 73 years     Clincally relevant sex: Male     Is Non-Hispanic African American: No     Diabetic: No     Tobacco smoker: No     Systolic Blood Pressure: 138 mmHg     Is BP treated: Yes     HDL Cholesterol: 57.5 mg/dL     Total Cholesterol: 170 mg/dL

## 2024-02-14 ENCOUNTER — Other Ambulatory Visit: Payer: Self-pay

## 2024-02-14 ENCOUNTER — Encounter: Payer: Self-pay | Admitting: Family Medicine

## 2024-02-14 DIAGNOSIS — Z1211 Encounter for screening for malignant neoplasm of colon: Secondary | ICD-10-CM

## 2024-02-14 DIAGNOSIS — Z01818 Encounter for other preprocedural examination: Secondary | ICD-10-CM

## 2024-02-14 NOTE — Addendum Note (Signed)
 Addended by: Zissel Biederman on: 02/14/2024 11:32 AM   Modules accepted: Orders

## 2024-02-14 NOTE — Telephone Encounter (Signed)
 Patient would like to schedule for 7.31  I told patient that I will give that date to the nurse and he states that we can send him a mychart message if we need anything else.

## 2024-02-14 NOTE — Telephone Encounter (Signed)
 Planned surgery: right carpal tunnel release with ultrasound guidance   Surgery date: 02/24/24 at [facility]: [address], [place], [place] [address]) - you will find out your arrival time the business day before your surgery.   Pre-op appointment at [facility] Pre-admit Testing: you will receive a call with a date/time for this appointment. If you are scheduled for an in person appointment, Pre-admit Testing is located on the first floor of the [facility] building, [address] [facility] [address], [address]. During this appointment, they will advise you which medications you can take the morning of surgery, and which medications you will need to hold for surgery. Labs (such as blood work, EKG) may be done at your pre-op appointment. You are not required to fast for these labs. Should you need to change your pre-op appointment, please call Pre-admit testing at [phone].    Blood thinners:   Aspirin  81mg :  OK to stay on aspirin  81mg    Surgical clearance: we received clearance from [facility] on 01/17/24    How to contact us :  If you have any questions/concerns before or after surgery, you can reach us  at [phone], or you can send a mychart message. We can be reached by phone or mychart 8am-4pm, Monday-Friday.  *Please note: Calls after 4pm are forwarded to a third party answering service. Mychart messages are not routinely monitored during evenings, weekends, and holidays. Please call our office to contact the answering service for urgent concerns during non-business hours.     If you have FMLA/disability paperwork, please drop it off or fax it to [phone]   Appointments/FMLA & disability paperwork: [employer] [employer] [facility] Registered Nurse/Surgery scheduler: Thorvald Orsino, RN Certified Medical Assistants: Don, CMA, Elenor, CMA, & Damien, CMA Physician Assistants: Lyle Decamp, PA-C, Edsel Goods, PA-C & Glade Boys, PA-C Surgeons:  Penne Sharps, MD & Reeves Daisy, MD   [facility] PREADMIT TESTING VISIT and SURGERY INFORMATION SHEET   Now that surgery has been scheduled you can anticipate several phone calls from [facility] services. A pharmacy technician will call you to verify your current list of medications taken at home.               The Pre-Service Center will call to verify your insurance information and to give you billing estimates and information.             The Preadmit Testing Office will be calling to schedule a visit to obtain information for the anesthesia team and provide instructions on preparation for surgery.  What can you expect for the Preadmit Testing Visit: Appointments may be scheduled in-person or by telephone.  If a telephone visit is scheduled, you may be asked to come into the office to have lab tests or other studies performed.   This visit will not be completed any greater than 14 days prior to your surgery.  If your surgery has been scheduled for a future date, please do not be alarmed if we have not contacted you to schedule an appointment more than a month prior to the surgery date.    Please be prepared to provide the following information during this appointment:            -Personal medical history                                               -  Medication and allergy list            -Any history of problems with anesthesia              -Recent lab work or diagnostic studies            -Please notify us  of any needs we should be aware of to provide the best care possible           -You will be provided with instructions on how to prepare for your surgery.    On The Day of Surgery:  You must have a driver to take you home after surgery, you will be asked not to drive for 24 hours following surgery.  Taxi, Gisele and non-medical transport will not be acceptable means of transportation unless you have a responsible individual who will be traveling with  you.  Visitors in the surgical area:   2 people will be able to visit you in your room once your preparation for surgery has been completed. During surgery, your visitors will be asked to wait in the Surgery Waiting Area.  It is not a requirement for them to stay, if they prefer to leave and come back.  Your visitor(s) will be given an update once the surgery has been completed.  No visitors are allowed in the initial recovery room to respect patient privacy and safety.  Once you are more awake and transfer to the secondary recovery area, or are transferred to an inpatient room, visitors will again be able to see you.  To respect and protect your privacy: We will ask on the day of surgery who your driver will be and what the contact number for that individual will be. We will ask if it is okay to share information with this individual, or if there is an alternative individual that we, or the surgeon, should contact to provide updates and information. If family or friends come to the surgical information desk requesting information about you, who you have not listed with us , no information will be given.   It may be helpful to designate someone as the main contact who will be responsible for updating your other friends and family.    PREADMIT TESTING OFFICE: [phone] SAME DAY SURGERY: [phone] We look forward to caring for you before and throughout the process of your surgery.

## 2024-02-15 ENCOUNTER — Encounter: Payer: Self-pay | Admitting: Family Medicine

## 2024-02-17 ENCOUNTER — Other Ambulatory Visit: Payer: Self-pay

## 2024-02-17 ENCOUNTER — Encounter
Admission: RE | Admit: 2024-02-17 | Discharge: 2024-02-17 | Disposition: A | Discharge status: Home / Self Care | Source: Ambulatory Visit | Attending: Neurosurgery | Admitting: Neurosurgery

## 2024-02-17 ENCOUNTER — Ambulatory Visit: Admitting: Neurosurgery

## 2024-02-17 ENCOUNTER — Encounter: Payer: Self-pay | Admitting: Neurosurgery

## 2024-02-17 VITALS — BP 130/72 | Ht 67.0 in | Wt 240.4 lb

## 2024-02-17 DIAGNOSIS — M961 Postlaminectomy syndrome, not elsewhere classified: Secondary | ICD-10-CM

## 2024-02-17 DIAGNOSIS — G629 Polyneuropathy, unspecified: Secondary | ICD-10-CM

## 2024-02-17 HISTORY — DX: Angina pectoris, unspecified: I20.9

## 2024-02-17 NOTE — Patient Instructions (Signed)

## 2024-02-17 NOTE — Addendum Note (Signed)
 Addended by: Glema Takaki W on: 02/17/2024 04:28 PM   Modules accepted: Orders

## 2024-02-17 NOTE — Progress Notes (Signed)
   REFERRING PHYSICIAN:  Rilla Baller, Md [address] [place],  [place] [address]  DOS:  11/27/22   L4-L5 TLIF/PSF   HISTORY OF PRESENT ILLNESS: Randy Kennedy is status post L4-L5 TLIF/PSF.   He continues to have issues with back and leg discomfort.  PHYSICAL EXAMINATION:  General: Patient is well developed, well nourished, calm, collected, and in no apparent distress.   NEUROLOGICAL:  General: In no acute distress.   Awake, alert, oriented to person, place, and time.  Pupils equal round and reactive to light.  Facial tone is symmetric.     Strength: Walking with assistance   ROS (Neurologic):  Negative except as noted above  IMAGING: No complications noted  CT - showed healing through the disc space  EMG NCV 01/21/2024 NCV & EMG Findings: Extensive electrodiagnostic testing of the left lower extremity and additional studies of the right shows:  Bilateral sural and superficial peroneal sensory responses are absent. Bilateral peroneal motor responses are absent at the extensor digitorum brevis, and normal at the tibialis anterior.  Bilateral tibial motor responses show reduced amplitude. Tibial H reflex study on the right is prolonged, and normal on the left. Chronic motor axonal loss changes are seen affecting the anterior tibialis, flexor digitorum longus, rectus femoris, and adductor longus muscles, worse on the left.  There is no evidence of accompanying active denervation.     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     ___________________________ Tonita Blanch, DO  ASSESSMENT/PLAN:  Randy Kennedy is doing fair s/p above surgery.  Unfortunately, he has not had his good result as we were hoping for.  He continues to have chronic discomfort and has symptoms of neuropathy in his feet.  He now has confirmed  neuropathy in his legs.  He also has postlaminectomy syndrome.  I think he is a candidate to consider spinal cord stimulation.  I will make the appropriate referrals.  I spent a total of 10 minutes in this patient's care today. This time was spent reviewing pertinent records including imaging studies, obtaining and confirming history, performing a directed evaluation, formulating and discussing my recommendations, and documenting the visit within the medical record.   Reeves Daisy, MD  Department of neurosurgery

## 2024-02-17 NOTE — Patient Instructions (Addendum)
 Your procedure is scheduled on: 02/24/24 - Thursday Report to the Registration Desk on the 1st floor of the [facility]. To find out your arrival time, please call [phone] between 1PM - 3PM on: 02/23/24 - Wednesday If your arrival time is 6:00 am, do not arrive before that time as the [facility] entrance doors do not open until 6:00 am.  REMEMBER: Instructions that are not followed completely may result in serious medical risk, up to and including death; or upon the discretion of your surgeon and anesthesiologist your surgery may need to be rescheduled.  Do not eat food after midnight the night before surgery.  No gum chewing or hard candies.  You may however, drink CLEAR liquids up to 2 hours before you are scheduled to arrive for your surgery. Do not drink anything within 2 hours of your scheduled arrival time.  Clear liquids include: - water   - apple juice without pulp - gatorade (not RED colors) - black coffee or tea (Do NOT add milk or creamers to the coffee or tea) Do NOT drink anything that is not on this list.   One week prior to surgery: Stop Anti-inflammatories (NSAIDS) such as Advil, Aleve, Ibuprofen, Motrin, Naproxen, Naprosyn and Aspirin  based products such as Excedrin, Goody's Powder, BC Powder. You may take Tylenol  if needed for pain up until the day of surgery.  Stop ANY OVER THE COUNTER supplements until after surgery : Vitamin C   ON THE DAY OF SURGERY ONLY TAKE THESE MEDICATIONS WITH SIPS OF WATER :  carvedilol  (COREG )  gabapentin  (NEURONTIN )  hydrALAZINE  (APRESOLINE )  probenecid  (BENEMID )  fluticasone  (FLONASE )    No Alcohol for 24 hours before or after surgery.  No Smoking including e-cigarettes for 24 hours before surgery.  No chewable tobacco products for at least 6 hours before surgery.  No nicotine patches on the day of surgery.  Do not use any recreational drugs for at least a week (preferably 2 weeks) before your surgery.  Please be  advised that the combination of cocaine and anesthesia may have negative outcomes, up to and including death. If you test positive for cocaine, your surgery will be cancelled.  On the morning of surgery brush your teeth with toothpaste and water , you may rinse your mouth with mouthwash if you wish. Do not swallow any toothpaste or mouthwash.  Do not wear jewelry, make-up, hairpins, clips or nail polish.  For welded (permanent) jewelry: bracelets, anklets, waist bands, etc.  Please have this removed prior to surgery.  If it is not removed, there is a chance that hospital personnel will need to cut it off on the day of surgery.  Do not wear lotions, powders, or perfumes.   Do not shave body hair from the neck down 48 hours before surgery.  Contact lenses, hearing aids and dentures may not be worn into surgery.  Do not bring valuables to the hospital. [facility] is not responsible for any missing/lost belongings or valuables.   Notify your doctor if there is any change in your medical condition (cold, fever, infection).  Wear comfortable clothing (specific to your surgery type) to the hospital.  After surgery, you can help prevent lung complications by doing breathing exercises.  Take deep breaths and cough every 1-2 hours. Your doctor may order a device called an Incentive Spirometer to help you take deep breaths.  When coughing or sneezing, hold a pillow firmly against your incision with both hands. This is called "splinting." Doing this helps protect your  incision. It also decreases belly discomfort.  If you are being admitted to the hospital overnight, leave your suitcase in the car. After surgery it may be brought to your room.  In case of increased patient census, it may be necessary for you, the patient, to continue your postoperative care in the Same Day Surgery department.  If you are being discharged the day of surgery, you will not be allowed to drive home. You will need a  responsible individual to drive you home and stay with you for 24 hours after surgery.   If you are taking public transportation, you will need to have a responsible individual with you.  Please call the Pre-admissions Testing Dept. at [phone] if you have any questions about these instructions.  Surgery Visitation Policy:  Patients having surgery or a procedure may have two visitors.  Children under the age of 42 must have an adult with them who is not the patient.  Inpatient Visitation:    Visiting hours are 7 a.m. to 8 p.m. Up to four visitors are allowed at one time in a patient room. The visitors may rotate out with other people during the day.  One visitor age 42 or older may stay with the patient overnight and must be in the room by 8 p.m.   Merchandiser, retail to address health-related social needs:  [email or web address]     Preparing for Surgery with CHLORHEXIDINE  GLUCONATE (CHG) Soap  Chlorhexidine  Gluconate (CHG) Soap  o An antiseptic cleaner that kills germs and bonds with the skin to continue killing germs even after washing  o Used for showering the night before surgery and morning of surgery  Before surgery, you can play an important role by reducing the number of germs on your skin.  CHG (Chlorhexidine  gluconate) soap is an antiseptic cleanser which kills germs and bonds with the skin to continue killing germs even after washing.  Please do not use if you have an allergy to CHG or antibacterial soaps. If your skin becomes reddened/irritated stop using the CHG.  1. Shower the NIGHT BEFORE SURGERY and the MORNING OF SURGERY with CHG soap.  2. If you choose to wash your hair, wash your hair first as usual with your normal shampoo.  3. After shampooing, rinse your hair and body thoroughly to remove the shampoo.  4. Use CHG as you would any other liquid soap. You can apply CHG directly to the skin and wash gently with a scrungie or a  clean washcloth.  5. Apply the CHG soap to your body only from the neck down. Do not use on open wounds or open sores. Avoid contact with your eyes, ears, mouth, and genitals (private parts). Wash face and genitals (private parts) with your normal soap.  6. Wash thoroughly, paying special attention to the area where your surgery will be performed.  7. Thoroughly rinse your body with warm water .  8. Do not shower/wash with your normal soap after using and rinsing off the CHG soap.  9. Pat yourself dry with a clean towel.  10. Wear clean pajamas to bed the night before surgery.  12. Place clean sheets on your bed the night of your first shower and do not sleep with pets.  13. Shower again with the CHG soap on the day of surgery prior to arriving at the hospital.  14. Do not apply any deodorants/lotions/powders.  15. Please wear clean clothes to the hospital.

## 2024-02-21 NOTE — Addendum Note (Signed)
 Addended by: RILLA BALLER on: 02/21/2024 04:25 PM   Modules accepted: Orders

## 2024-02-21 NOTE — Telephone Encounter (Signed)
 See subsequent mychart message - he requests [facility]

## 2024-02-22 ENCOUNTER — Encounter: Payer: Self-pay | Admitting: Family Medicine

## 2024-02-22 ENCOUNTER — Ambulatory Visit (INDEPENDENT_AMBULATORY_CARE_PROVIDER_SITE_OTHER): Admitting: Family Medicine

## 2024-02-22 DIAGNOSIS — R739 Hyperglycemia, unspecified: Secondary | ICD-10-CM | POA: Diagnosis not present

## 2024-02-22 DIAGNOSIS — G4733 Obstructive sleep apnea (adult) (pediatric): Secondary | ICD-10-CM

## 2024-02-22 DIAGNOSIS — R7303 Prediabetes: Secondary | ICD-10-CM | POA: Diagnosis not present

## 2024-02-22 DIAGNOSIS — G894 Chronic pain syndrome: Secondary | ICD-10-CM

## 2024-02-22 LAB — POCT GLYCOSYLATED HEMOGLOBIN (HGB A1C): Hemoglobin A1C: 6 % — AB (ref 4.0–5.6)

## 2024-02-22 MED ORDER — TIRZEPATIDE-WEIGHT MANAGEMENT 2.5 MG/0.5ML ~~LOC~~ SOLN: 2.5000 mg | SUBCUTANEOUS | 0 refills | Status: DC

## 2024-02-22 MED ORDER — TIRZEPATIDE-WEIGHT MANAGEMENT 5 MG/0.5ML ~~LOC~~ SOLN
5.0000 mg | SUBCUTANEOUS | 2 refills | Status: DC
Start: 2024-03-21 — End: 2024-02-24

## 2024-02-22 NOTE — Progress Notes (Addendum)
 Ph: [phone] Fax: [phone]   Patient ID: Randy Kennedy, male    DOB: 07-Sep-1950, 73 y.o.   MRN: [id number]  This visit was conducted in person.  BP 130/78   Pulse 60   Temp 98.1 F (36.7 C) (Oral)   Ht 5' 7 (1.702 m)   Wt 241 lb 6 oz (109.5 kg)   SpO2 99%   BMI 37.80 kg/m    CC: discuss weight loss medication  Subjective:   HPI: Randy Kennedy is a 73 y.o. male presenting on 02/22/2024 for Weight Loss (Weight loss meds)   Still working on scheduling wound clinic appt.   Starting weight: 241 lbs Today's weight same   Has never been on bariatric medication.  He is interested in this.  Not interested in bariatric surgery.   Discussed bariatric medication options including phentermine, contrave, injectable GLP1/GIPs.   24 hour recall: 7:30am breakfast - 2 multi-grain slices of toast with butter with cup of coffee with creamer  3pm lunch - potato salad and 2 hot dogs with buns with mustard/relish/onions, drank 1 glass OJ with diet soda, piece of tootsie roll  Diet iced tea several glasses throughout the day  8pm snack - piece of muenster cheese   Activity regimen: Limited due to arthritis, chronic leg and back pain and now CTS - s/p L repair, pending treatment of R wrist.    Lab Results  Component Value Date   HGBA1C 6.0 (A) 02/22/2024        Relevant past medical, surgical, family and social history reviewed and updated as indicated. Interim medical history since our last visit reviewed. Allergies and medications reviewed and updated. Outpatient Medications Prior to Visit  Medication Sig Dispense Refill   aspirin  EC 81 MG tablet Take 81 mg by mouth daily.     carvedilol  (COREG ) 25 MG tablet Take 1 tablet (25 mg total) by mouth 2 (two) times daily with a meal. 180 tablet 3   colchicine  0.6 MG tablet Take 1 tablet (0.6 mg total) by mouth daily as needed (gout flare). 30 tablet 3   fexofenadine (ALLEGRA) 180 MG tablet Take 180 mg by mouth every  morning.     fluticasone  (FLONASE ) 50 MCG/ACT nasal spray SPRAY 2 SPRAYS INTO EACH NOSTRIL EVERY DAY 48 mL 3   gabapentin  (NEURONTIN ) 300 MG capsule Take 1 capsule (300 mg total) by mouth 2 (two) times daily AND 2-3 capsules (600-900 mg total) at bedtime. 480 capsule 3   hydrALAZINE  (APRESOLINE ) 100 MG tablet Take 1 tablet (100 mg total) by mouth 3 (three) times daily. 270 tablet 3   minoxidil  (LONITEN ) 2.5 MG tablet TAKE 1 TABLET BY MOUTH DAILY AT BEDTIME 90 tablet 2   montelukast  (SINGULAIR ) 10 MG tablet Take 1 tablet (10 mg total) by mouth at bedtime. 90 tablet 3   nitroGLYCERIN  (NITROSTAT ) 0.4 MG SL tablet Place 1 tablet (0.4 mg total) under the tongue every 5 (five) minutes as needed for chest pain. 25 tablet 4   pravastatin  (PRAVACHOL ) 40 MG tablet Take 1 tablet (40 mg total) by mouth daily. (Patient taking differently: Take 40 mg by mouth at bedtime.) 90 tablet 3   probenecid  (BENEMID ) 500 MG tablet TAKE 1/2 TABLET TWICE A DAY BY MOUTH 90 tablet 3   spironolactone  (ALDACTONE ) 25 MG tablet Take 1 tablet (25 mg total) by mouth daily. 90 tablet 3   valsartan  (DIOVAN ) 320 MG tablet Take 1 tablet (320 mg total) by mouth daily. 90 tablet 3  vitamin C (ASCORBIC ACID) 500 MG tablet Take 500 mg by mouth daily.     No facility-administered medications prior to visit.     Per HPI unless specifically indicated in ROS section below Review of Systems  Objective:  BP 130/78   Pulse 60   Temp 98.1 F (36.7 C) (Oral)   Ht 5' 7 (1.702 m)   Wt 241 lb 6 oz (109.5 kg)   SpO2 99%   BMI 37.80 kg/m   Wt Readings from Last 3 Encounters:  02/22/24 241 lb 6 oz (109.5 kg)  02/17/24 240 lb 6 oz (109 kg)  02/07/24 242 lb 2 oz (109.8 kg)      Physical Exam Vitals and nursing note reviewed.  Constitutional:      Appearance: Normal appearance. He is not ill-appearing.  HENT:     Head: Normocephalic and atraumatic.  Eyes:     Extraocular Movements: Extraocular movements intact.     Pupils: Pupils  are equal, round, and reactive to light.  Neck:     Thyroid : No thyroid  mass or thyromegaly.  Cardiovascular:     Rate and Rhythm: Normal rate and regular rhythm.     Pulses: Normal pulses.     Heart sounds: Normal heart sounds. No murmur heard. Pulmonary:     Effort: Pulmonary effort is normal. No respiratory distress.     Breath sounds: Normal breath sounds. No wheezing, rhonchi or rales.  Musculoskeletal:     Cervical back: Normal range of motion and neck supple.     Right lower leg: No edema.     Left lower leg: No edema.  Skin:    General: Skin is warm and dry.     Findings: No rash.  Neurological:     Mental Status: He is alert.  Psychiatric:        Mood and Affect: Mood normal.        Behavior: Behavior normal.       Results for orders placed or performed in visit on 02/22/24  POCT glycosylated hemoglobin (Hb A1C)   Collection Time: 02/22/24 12:22 PM  Result Value Ref Range   Hemoglobin A1C 6.0 (A) 4.0 - 5.6 %   HbA1c POC (<> result, manual entry)     HbA1c, POC (prediabetic range)     HbA1c, POC (controlled diabetic range)      Assessment & Plan:   Problem List Items Addressed This Visit     OSA (obstructive sleep apnea)   Unable to tolerate any CPAP masks - remains off treatment. Sleeps in recliner.       Severe obesity (BMI 35.0-39.9) with comorbidity (HCC) - Primary   Patient is interested in GLP1RA / GIP. Reviewed mechanism of action of medication as well as side effects and adverse events to watch for including nausea, diarrhea, constipation, pancreatitis. No fmhx medullary thyroid  cancer or MEN2. Discussed titration schedule for medication. Will start zepbound . Discussed need for regular visits for weight management to monitor medication effect and tolerance and weight loss, rec return 1 month after starting medication.   Obesity complicated by comorbidities of OSA, HTN, OA      Relevant Medications   tirzepatide  (ZEPBOUND ) 2.5 MG/0.5ML injection vial    tirzepatide  5 MG/0.5ML injection vial (Start on 03/21/2024)   Chronic pain disorder   Prediabetes   Reviewed A1c remaining in prediabetes range - encouraged limiting added sugar in diet.  Mediterranean diet handout provided.  Consider referral to local DPP at [facility].  Other Visit Diagnoses       Hyperglycemia       Relevant Orders   POCT glycosylated hemoglobin (Hb A1C) (Completed)        Meds ordered this encounter  Medications   tirzepatide  (ZEPBOUND ) 2.5 MG/0.5ML injection vial    Sig: Inject 2.5 mg into the skin once a week.    Dispense:  2 mL    Refill:  0   tirzepatide  5 MG/0.5ML injection vial    Sig: Inject 5 mg into the skin once a week.    Dispense:  2 mL    Refill:  2    Orders Placed This Encounter  Procedures   POCT glycosylated hemoglobin (Hb A1C)    Patient Instructions  Continue healthy diet choices.  Look at [employer] provided today.  Price out Zepbound  - sent to [employer] may call them to price out [phone]  Return in 4-6 weeks for weight management visit  Follow up plan: Return in about 6 weeks (around 04/04/2024), or if symptoms worsen or fail to improve, for follow up visit.  Anton Blas, MD

## 2024-02-22 NOTE — Assessment & Plan Note (Addendum)
 Reviewed A1c remaining in prediabetes range - encouraged limiting added sugar in diet.  Mediterranean diet handout provided.  Consider referral to local DPP at [facility].

## 2024-02-22 NOTE — Assessment & Plan Note (Signed)
 Unable to tolerate any CPAP masks - remains off treatment. Sleeps in recliner.

## 2024-02-22 NOTE — Patient Instructions (Addendum)
 Continue healthy diet choices.  Look at [employer] provided today.  Price out Zepbound  - sent to [employer] may call them to price out [phone]  Return in 4-6 weeks for weight management visit

## 2024-02-22 NOTE — Assessment & Plan Note (Addendum)
 Patient is interested in GLP1RA / GIP. Reviewed mechanism of action of medication as well as side effects and adverse events to watch for including nausea, diarrhea, constipation, pancreatitis. No fmhx medullary thyroid  cancer or MEN2. Discussed titration schedule for medication. Will start zepbound . Discussed need for regular visits for weight management to monitor medication effect and tolerance and weight loss, rec return 1 month after starting medication.   Obesity complicated by comorbidities of OSA, HTN, OA

## 2024-02-23 ENCOUNTER — Telehealth: Payer: Self-pay | Admitting: Internal Medicine

## 2024-02-23 MED ORDER — CEFAZOLIN SODIUM-DEXTROSE 2-4 GM/100ML-% IV SOLN
2.0000 g | INTRAVENOUS | Status: AC
  Administered 2024-02-24: 2 g via INTRAVENOUS

## 2024-02-23 MED ORDER — CEFAZOLIN IN SODIUM CHLORIDE 2-0.9 GM/100ML-% IV SOLN
2.0000 g | Freq: Once | INTRAVENOUS | Status: DC
  Filled 2024-02-23: qty 100

## 2024-02-23 MED ORDER — CHLORHEXIDINE GLUCONATE 0.12 % MT SOLN
15.0000 mL | Freq: Once | OROMUCOSAL | Status: AC
  Administered 2024-02-24: 15 mL via OROMUCOSAL

## 2024-02-23 MED ORDER — ORAL CARE MOUTH RINSE: 15.0000 mL | Freq: Once | OROMUCOSAL | Status: AC

## 2024-02-23 MED ORDER — LACTATED RINGERS IV SOLN: INTRAVENOUS | Status: DC

## 2024-02-23 NOTE — Telephone Encounter (Signed)
 Good Morning Dr. Federico,  DOD for today 7/30 AM    Patient called stating that his PCP Dr. Rilla sent a referral for him to have a colonoscopy. Upon looking in patients chart, he had a colonoscopy at  [facility] in 2022. Patient states he tried to schedule there, but was told it would be December before he could have the procedure. Dr. Rilla advised him that he did not want patient to wait that long and sent the referral to us . Patient states he does have family history of colon cancer and is not having any symptoms. Patients records are in [facility], will you please review and advise on scheduling patient?  Thank you.

## 2024-02-23 NOTE — Telephone Encounter (Signed)
 Okay to schedule for direct colonoscopy for polyp surveillance.  Colonoscopy 12/17/20: Excellent prep. - Five 3 to 6 mm polyps in the transverse colon, in the ascending colon and in the cecum, removed with a cold snare. Resected and retrieved. - Non- bleeding external hemorrhoids. Path: A. COLON POLYP X2, CECUM; COLD SNARE:  - TUBULAR ADENOMA, MULTIPLE FRAGMENTS.  - NEGATIVE FOR HIGH-GRADE DYSPLASIA AND MALIGNANCY.  B. COLON POLYP, ASCENDING; COLD SNARE:  - TUBULAR ADENOMA.  - NEGATIVE FOR HIGH-GRADE DYSPLASIA AND MALIGNANCY.  C. COLON POLYP X2, TRANSVERSE; COLD SNARE:  - TUBULAR ADENOMA, TWO FRAGMENTS.  - NEGATIVE FOR HIGH-GRADE DYSPLASIA AND MALIGNANCY.

## 2024-02-24 ENCOUNTER — Ambulatory Visit: Payer: Self-pay | Admitting: Urgent Care

## 2024-02-24 ENCOUNTER — Encounter
Admission: RE | Disposition: A | Discharge status: Home / Self Care | Payer: Self-pay | Source: Home / Self Care | Attending: Neurosurgery

## 2024-02-24 ENCOUNTER — Ambulatory Visit
Admission: RE | Admit: 2024-02-24 | Discharge: 2024-02-24 | Disposition: A | Discharge status: Home / Self Care | Attending: Neurosurgery | Admitting: Neurosurgery

## 2024-02-24 ENCOUNTER — Other Ambulatory Visit: Payer: Self-pay | Admitting: Family Medicine

## 2024-02-24 ENCOUNTER — Other Ambulatory Visit: Payer: Self-pay

## 2024-02-24 ENCOUNTER — Encounter: Payer: Self-pay | Admitting: Neurosurgery

## 2024-02-24 DIAGNOSIS — G8929 Other chronic pain: Secondary | ICD-10-CM | POA: Insufficient documentation

## 2024-02-24 DIAGNOSIS — I10 Essential (primary) hypertension: Secondary | ICD-10-CM | POA: Diagnosis not present

## 2024-02-24 DIAGNOSIS — Z950 Presence of cardiac pacemaker: Secondary | ICD-10-CM | POA: Diagnosis not present

## 2024-02-24 DIAGNOSIS — M109 Gout, unspecified: Secondary | ICD-10-CM | POA: Insufficient documentation

## 2024-02-24 DIAGNOSIS — Z01818 Encounter for other preprocedural examination: Secondary | ICD-10-CM

## 2024-02-24 DIAGNOSIS — Z87891 Personal history of nicotine dependence: Secondary | ICD-10-CM | POA: Diagnosis not present

## 2024-02-24 DIAGNOSIS — G5601 Carpal tunnel syndrome, right upper limb: Secondary | ICD-10-CM | POA: Insufficient documentation

## 2024-02-24 DIAGNOSIS — G5603 Carpal tunnel syndrome, bilateral upper limbs: Secondary | ICD-10-CM

## 2024-02-24 DIAGNOSIS — Z6837 Body mass index (BMI) 37.0-37.9, adult: Secondary | ICD-10-CM | POA: Diagnosis not present

## 2024-02-24 DIAGNOSIS — G4730 Sleep apnea, unspecified: Secondary | ICD-10-CM | POA: Diagnosis not present

## 2024-02-24 DIAGNOSIS — E669 Obesity, unspecified: Secondary | ICD-10-CM | POA: Diagnosis not present

## 2024-02-24 DIAGNOSIS — I495 Sick sinus syndrome: Secondary | ICD-10-CM | POA: Insufficient documentation

## 2024-02-24 HISTORY — PX: CARPAL TUNNEL RELEASE: SHX101

## 2024-02-24 HISTORY — PX: OPERATIVE ULTRASOUND: SHX5996

## 2024-02-24 SURGERY — CARPAL TUNNEL RELEASE
Anesthesia: General | Laterality: Right

## 2024-02-24 MED ORDER — HYDROCODONE-ACETAMINOPHEN 5-325 MG PO TABS: 1.0000 | ORAL_TABLET | Freq: Four times a day (QID) | ORAL | 0 refills | Status: AC | PRN

## 2024-02-24 MED ORDER — FENTANYL CITRATE (PF) 100 MCG/2ML IJ SOLN
INTRAMUSCULAR | Status: DC | PRN
  Administered 2024-02-24 (×4): 25 ug via INTRAVENOUS

## 2024-02-24 MED ORDER — BUPIVACAINE LIPOSOME 1.3 % IJ SUSP
INTRAMUSCULAR | Status: AC
  Filled 2024-02-24: qty 10

## 2024-02-24 MED ORDER — BUPIVACAINE-EPINEPHRINE (PF) 0.5% -1:200000 IJ SOLN
INTRAMUSCULAR | Status: AC
  Filled 2024-02-24: qty 10

## 2024-02-24 MED ORDER — TIRZEPATIDE-WEIGHT MANAGEMENT 2.5 MG/0.5ML ~~LOC~~ SOLN: 2.5000 mg | SUBCUTANEOUS | 0 refills | Status: DC

## 2024-02-24 MED ORDER — OXYCODONE HCL 5 MG PO TABS
5.0000 mg | ORAL_TABLET | Freq: Once | ORAL | Status: AC | PRN
  Administered 2024-02-24: 5 mg via ORAL

## 2024-02-24 MED ORDER — FENTANYL CITRATE (PF) 100 MCG/2ML IJ SOLN
INTRAMUSCULAR | Status: AC
  Filled 2024-02-24: qty 2

## 2024-02-24 MED ORDER — PROPOFOL 1000 MG/100ML IV EMUL
INTRAVENOUS | Status: AC
  Filled 2024-02-24: qty 100

## 2024-02-24 MED ORDER — CHLORHEXIDINE GLUCONATE 0.12 % MT SOLN
OROMUCOSAL | Status: AC
  Filled 2024-02-24: qty 15

## 2024-02-24 MED ORDER — LIDOCAINE HCL (CARDIAC) PF 100 MG/5ML IV SOSY
PREFILLED_SYRINGE | INTRAVENOUS | Status: DC | PRN
  Administered 2024-02-24: 60 mg via INTRAVENOUS

## 2024-02-24 MED ORDER — ONDANSETRON HCL 4 MG/2ML IJ SOLN
INTRAMUSCULAR | Status: DC | PRN
  Administered 2024-02-24: 4 mg via INTRAVENOUS

## 2024-02-24 MED ORDER — MIDAZOLAM HCL 2 MG/2ML IJ SOLN
INTRAMUSCULAR | Status: AC
  Filled 2024-02-24: qty 2

## 2024-02-24 MED ORDER — ONDANSETRON HCL 4 MG/2ML IJ SOLN: 4.0000 mg | Freq: Once | INTRAMUSCULAR | Status: DC | PRN

## 2024-02-24 MED ORDER — TIRZEPATIDE-WEIGHT MANAGEMENT 5 MG/0.5ML ~~LOC~~ SOLN: 5.0000 mg | SUBCUTANEOUS | 2 refills | Status: DC

## 2024-02-24 MED ORDER — METHYLPREDNISOLONE ACETATE 40 MG/ML IJ SUSP
INTRAMUSCULAR | Status: AC
Start: 2024-02-24 — End: 2024-02-24
  Filled 2024-02-24: qty 1

## 2024-02-24 MED ORDER — ACETAMINOPHEN 10 MG/ML IV SOLN: 1000.0000 mg | Freq: Once | INTRAVENOUS | Status: DC | PRN

## 2024-02-24 MED ORDER — 0.9 % SODIUM CHLORIDE (POUR BTL) OPTIME
TOPICAL | Status: DC | PRN
  Administered 2024-02-24: 75 mL

## 2024-02-24 MED ORDER — OXYCODONE HCL 5 MG/5ML PO SOLN: 5.0000 mg | Freq: Once | ORAL | Status: AC | PRN

## 2024-02-24 MED ORDER — FENTANYL CITRATE (PF) 100 MCG/2ML IJ SOLN: 25.0000 ug | INTRAMUSCULAR | Status: DC | PRN

## 2024-02-24 MED ORDER — MIDAZOLAM HCL 2 MG/2ML IJ SOLN
INTRAMUSCULAR | Status: DC | PRN
  Administered 2024-02-24: 2 mg via INTRAVENOUS

## 2024-02-24 MED ORDER — PROPOFOL 500 MG/50ML IV EMUL
INTRAVENOUS | Status: DC | PRN
Start: 2024-02-24 — End: 2024-02-24
  Administered 2024-02-24: 40 ug/kg/min via INTRAVENOUS

## 2024-02-24 MED ORDER — OXYCODONE HCL 5 MG PO TABS
ORAL_TABLET | ORAL | Status: AC
Start: 2024-02-24 — End: 2024-02-24
  Filled 2024-02-24: qty 1

## 2024-02-24 MED ORDER — LIDOCAINE HCL (PF) 1 % IJ SOLN
INTRAMUSCULAR | Status: AC
Start: 2024-02-24 — End: 2024-02-24
  Filled 2024-02-24: qty 30

## 2024-02-24 MED ORDER — LACTATED RINGERS IV SOLN: INTRAVENOUS | Status: DC

## 2024-02-24 MED ORDER — CEFAZOLIN SODIUM-DEXTROSE 2-4 GM/100ML-% IV SOLN
INTRAVENOUS | Status: AC
  Filled 2024-02-24: qty 100

## 2024-02-24 MED ORDER — SODIUM CHLORIDE (PF) 0.9 % IJ SOLN
INTRAMUSCULAR | Status: AC
  Filled 2024-02-24: qty 10

## 2024-02-24 MED ORDER — LIDOCAINE HCL 1 % IJ SOLN
INTRAMUSCULAR | Status: DC | PRN
  Administered 2024-02-24: 6 mL

## 2024-02-24 SURGICAL SUPPLY — 20 items
BNDG ADH 1X3 SHEER STRL LF (GAUZE/BANDAGES/DRESSINGS) ×1 IMPLANT
BNDG ADH 2 X3.75 FABRIC TAN LF (GAUZE/BANDAGES/DRESSINGS) IMPLANT
BRUSH SCRUB EZ 4% CHG (MISCELLANEOUS) ×1 IMPLANT
CHLORAPREP W/TINT 26 (MISCELLANEOUS) ×1 IMPLANT
COVER PROBE FLX POLY STRL (MISCELLANEOUS) ×1 IMPLANT
DERMABOND ADVANCED .7 DNX12 (GAUZE/BANDAGES/DRESSINGS) ×1 IMPLANT
DRAPE EXTREMITY 106X87X128.5 (DRAPES) ×1 IMPLANT
DRAPE IMP U-DRAPE 54X76 (DRAPES) ×1 IMPLANT
DRAPE SHEET LG 3/4 BI-LAMINATE (DRAPES) ×1 IMPLANT
GLOVE BIOGEL PI IND STRL 8 (GLOVE) ×1 IMPLANT
GLOVE SRG 8 PF TXTR STRL LF DI (GLOVE) ×1 IMPLANT
GLOVE SURG SYN 7.5 PF PI (GLOVE) ×1 IMPLANT
GOWN SRG XL LVL 3 NONREINFORCE (GOWNS) ×1 IMPLANT
KIT TURNOVER KIT A (KITS) ×1 IMPLANT
NDL HYPO 25X1 1.5 SAFETY (NEEDLE) ×1 IMPLANT
NEEDLE HYPO 25X1 1.5 SAFETY (NEEDLE) ×1 IMPLANT
NS IRRIG 500ML POUR BTL (IV SOLUTION) ×1 IMPLANT
PACK BASIN MINOR ARMC (MISCELLANEOUS) ×1 IMPLANT
STOCKINETTE IMPERVIOUS 9X36 MD (GAUZE/BANDAGES/DRESSINGS) ×1 IMPLANT
ULTRAGLIDE CTR (BLADE) ×1 IMPLANT

## 2024-02-24 NOTE — Progress Notes (Signed)
 Referring Physician:  Rilla Baller, MD [address] [place],  [place] [address]  Primary Physician:  Rilla Baller, MD  History of Present Illness: 02/24/24  [name] is here today with a chief complaint of severe bilateral carpal tunnel syndrome.  He has been dealing with this for over 3 years.  He is well-known to our service for multiple spine issues.  Had a previous left sided procedure done, here for the right   Review of Systems:  A 10 point review of systems is negative, except for the pertinent positives and negatives detailed in the HPI.  Past Medical History: Past Medical History:  Diagnosis Date   Allergic rhinitis    Arthritis    Bradycardia    Chest pain    a. 11/2011 normal stress test performed in [place]  - see chart.   Chronic fatigue    Chronic low back pain with bilateral sciatica    Chronic pain    L ankle and bilateral hips (remote fracture s/p surgeries), lower back (told has herniated disc)   Closed avulsion fracture of right talus 06/2016   COVID-19 06/2020   Diastolic dysfunction 02/26/2022   a.) TTE 02/26/2022: EF 60-65%, mod RVE, mod LAE, sev RAE, AoV sclerosis, G2DD   Dyspnea    Eczema    Erectile dysfunction 09/14/2012   Bertrum disease    Gout 2007   Hearing loss    otosclerosis   Heartburn    mild, controlled with pepcid    History of chicken pox    History of hepatitis B    as child (51 years of age), no sequelae   HLD (hyperlipidemia)    HTN (hypertension)    difficult to control - clonidine  and beta blockers caused bradycardia   Leg edema    Lobar pneumonia (HCC) 05/13/2022   Morbid obesity (HCC)    Motion sickness    Most moving vehicles   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 moderate, AHI 37.4 increased to 80 in REM, on BiPAP 14/10, 97% compliance >4 hrs (06/2013); b. Does not tolerate CPAP.   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 Treated with vicodin, prior tried fentanyl  and butrans.   Sinus node dysfunction (HCC)    a.) s/p Abbott Assurity MRI dual-chamber pacemaker placement 02/27/2022   Spondylolisthesis of lumbar region    Systolic murmur    Thrombocytopenia (HCC)    Wears dentures    Partial lower   Wears hearing aid in both ears    Does not wear all the time    Past Surgical History: Past Surgical History:  Procedure Laterality Date   APPLICATION OF INTRAOPERATIVE CT SCAN N/A 11/27/2022   Procedure: APPLICATION OF INTRAOPERATIVE CT SCAN;  Surgeon: Clois Fret, MD;  Location: [facility];  Service: Neurosurgery;  Laterality: N/A;   CATARACT EXTRACTION W/PHACO Right 11/26/2020   Procedure: CATARACT EXTRACTION PHACO AND INTRAOCULAR LENS PLACEMENT (IOC) RIGHT VIVITY TORIC LENS 7.17 00:49.6;  Surgeon: Jaye Fallow, MD;  Location: [facility];  Service: Ophthalmology;  Laterality: Right;  Latex Sleep Apnea   CATARACT EXTRACTION W/PHACO Left 12/10/2020   Procedure: CATARACT EXTRACTION PHACO AND INTRAOCULAR LENS PLACEMENT (IOC) LEFT VIVITY;  Surgeon: Jaye Fallow, MD;  Location: [facility];  Service: Ophthalmology;  Laterality: Left;  Latex Sleep apnea 5.37 00:32.7   COLONOSCOPY  02/2011   ext hem, benign polyp, rpt 5 yrs ([place] )   COLONOSCOPY WITH PROPOFOL  N/A 12/17/2020   multiple TA, ext hem, rpt 3 yrs (Vanga, Corinn Skiff, MD)   LEG SURGERY  x5   left - after fall at work (3.5 stories)   MANDIBLE SURGERY  1965   jaw fracture - horse kick   MOHS SURGERY Right 07/03/2021   Ear   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   Procedure:  PACEMAKER IMPLANT;  Surgeon: Inocencio Soyla Lunger, MD;  Location: [facility];  Service: Cardiovascular;  Laterality: N/A;   TRANSFORAMINAL LUMBAR INTERBODY FUSION W/ MIS 1 LEVEL N/A 11/27/2022   Procedure: L4-5 MINIMALLY INVASIVE (MIS) TRANSFORAMINAL LUMBAR INTERBODY FUSION (TLIF);  Surgeon: Clois Fret, MD;  Location: [facility];  Service: Neurosurgery;  Laterality: N/A;   TYMPANIC MEMBRANE REPAIR Left 1994   otosclerosis    Allergies: Allergies as of 01/03/2024 - Review Complete 12/13/2023  Allergen Reaction Noted   Amlodipine  Rash 06/24/2021   Allopurinol Other (See Comments) 09/13/2012   Lisinopril -hydrochlorothiazide   02/16/2013   Procardia  [nifedipine ] Other (See Comments) 07/12/2013   Clonidine  derivatives Rash 02/25/2022   Doxazosin  mesylate Rash 10/07/2017   Latex Rash 11/14/2020   Nortriptyline  Rash 07/01/2018   Uloric  [febuxostat ] Rash 05/07/2017    Medications:  Current Outpatient Medications:    aspirin  EC 81 MG tablet, Take 81 mg by mouth daily., Disp: , Rfl:    carvedilol  (COREG ) 25 MG tablet, Take 1 tablet (25 mg total) by mouth 2 (two) times daily with a meal., Disp: 180 tablet, Rfl: 3   colchicine  0.6 MG tablet, TAKE ONE TABLET BY MOUTH ONCE DAILY, Disp: 90 tablet, Rfl: 3   fexofenadine (ALLEGRA) 180 MG tablet, Take 180 mg by mouth every morning., Disp: , Rfl:    fluticasone  (FLONASE ) 50 MCG/ACT nasal spray, SPRAY 2 SPRAYS INTO EACH NOSTRIL EVERY DAY, Disp: 48 mL, Rfl: 0   gabapentin  (NEURONTIN ) 300 MG capsule, TAKE 1 CAPSULE BY MOUTH TWICE A DAY AND 2 CAPSULES AT BEDTIME, Disp: 360 capsule, Rfl: 1   hydrALAZINE  (APRESOLINE ) 100 MG tablet, Take 1 tablet (100 mg total) by mouth 3 (three) times daily., Disp: 270 tablet, Rfl: 3   minoxidil  (LONITEN ) 2.5 MG tablet, TAKE 1 TABLET BY MOUTH DAILY AT BEDTIME, Disp: 90 tablet, Rfl: 2   montelukast  (SINGULAIR ) 10 MG tablet, TAKE 1 TABLET BY MOUTH EVERYDAY AT BEDTIME, Disp: 90 tablet, Rfl: 0   nitroGLYCERIN   (NITROSTAT ) 0.4 MG SL tablet, Place 1 tablet (0.4 mg total) under the tongue every 5 (five) minutes as needed for chest pain., Disp: 25 tablet, Rfl: 4   pravastatin  (PRAVACHOL ) 40 MG tablet, Take 1 tablet (40 mg total) by mouth daily., Disp: 90 tablet, Rfl: 3   probenecid  (BENEMID ) 500 MG tablet, TAKE 1/2 TABLET TWICE A DAY BY MOUTH, Disp: 90 tablet, Rfl: 1   spironolactone  (ALDACTONE ) 25 MG tablet, Take 1 tablet (25 mg total) by mouth daily., Disp: 90 tablet, Rfl: 3   valsartan  (DIOVAN ) 320 MG tablet, Take 1 tablet (320 mg total) by mouth daily., Disp: 90 tablet, Rfl: 3   vitamin C (ASCORBIC ACID) 500 MG tablet, Take 500 mg by mouth  daily., Disp: , Rfl:   Social History: Social History   Tobacco Use   Smoking status: Former    Current packs/day: 0.00    Average packs/day: 1 pack/day for 4.0 years (4.0 ttl pk-yrs)    Types: Pipe, Cigarettes    Start date: 07/27/1974    Quit date: 07/27/1978    Years since quitting: 45.4   Smokeless tobacco: Never  Vaping Use   Vaping status: Never Used  Substance Use Topics   Alcohol use: No    Alcohol/week: 0.0 standard drinks of alcohol   Drug use: No    Family Medical History: Family History  Problem Relation Age of Onset   Cancer Mother        rectal   Hypertension Mother    Cancer Father 37       prostate   Cancer Paternal Uncle        prostate   Diabetes Neg Hx    Stroke Neg Hx    CAD Neg Hx     Physical Examination: There were no vitals filed for this visit.  General: Patient is in no apparent distress. Attention to examination is appropriate.  Neck:   Supple.  Full range of motion.  Respiratory: Patient is breathing without any difficulty.   NEUROLOGICAL:     Awake, alert, oriented to person, place, and time.  Speech is clear and fluent.   Cranial Nerves: Pupils equal round and reactive to light.  Facial tone is symmetric. Shoulder shrug is symmetric. Tongue protrusion is midline.  There is no pronator drift.  Motor Exam:   He shows mild approximately 4+ weakness in the bilateral median innervated thenar muscles with sparing of his ulnar innervated intrinsic hand muscles.  He does not have major wasting noted.  He has positive reverse Phalen sign and positive carpal compression test.  He has splitting of his ring finger sensory wise.   Medical Decision Making  Electrodiagnostics:  [facility]  [address] [place], [address]  [place], [place] [address] Tel: [phone] Fax: [phone] Test Date:  11/19/2023   Patient: Randy Kennedy DOB: 1951/02/01 Physician: Tonita Blanch, DO  Sex: Male Height: 5' 7 Ref Phys: Glade Boys, DEVONNA  ID#: [id number]     Technician:      History: This is a 73 year old male referred for evaluation of numbness and tingling involving both hands.   NCV & EMG Findings: Extensive electrodiagnostic testing of the right upper extremity and additional studies of the left shows:  Bilateral median sensory responses show prolonged latency (R5.8, L6.2 ms) and reduced amplitude (R5.5, L4.1 V).  Bilateral ulnar sensory responses are within normal limits. Bilateral median motor responses show prolonged latency (R5.1, L5.8 ms).  Bilateral ulnar motor responses are within normal limits. Chronic motor axonal loss changes are seen affecting bilateral abductor pollicis brevis muscles, without accompanying active denervation.   Impression: Bilateral median neuropathy at or distal to the wrist, consistent with a clinical diagnosis of carpal tunnel syndrome.  Overall, these findings are moderate-to-severe in degree electrically.     ___________________________ Tonita Blanch, DO  I have personally reviewed the images and electrodiagnostics and agree with the above interpretation.  Assessment and Plan: Mr. Hoglund is a pleasant 73 y.o. male with severe bilateral carpal tunnel syndrome left worse than right. He had the left decompressed and is now here for the right side.   On  physical examination he has mild weakness in his median innervated intrinsic musculature.  He does have decree  sensation as well subjectively approximately 70 to 80% loss.  His provocative signs are positive for carpal tunnel tests.  His EMG demonstrated moderately severe carpal tunnel bilaterally.  He is here today for right sided decompression  Thank you for involving me in the care of this patient.    Penne MICAEL Sharps MD/MSCR Neurosurgery - Peripheral Nerve Surgery

## 2024-02-24 NOTE — Anesthesia Postprocedure Evaluation (Signed)
 Anesthesia Post Note  Patient: Randy Kennedy  Procedure(s) Performed: CARPAL TUNNEL RELEASE (Right) US  INTRAOPERATIVE (Right)  Patient location during evaluation: PACU Anesthesia Type: General Level of consciousness: awake and alert, oriented and patient cooperative Pain management: pain level controlled Vital Signs Assessment: post-procedure vital signs reviewed and stable Respiratory status: spontaneous breathing, nonlabored ventilation and respiratory function stable Cardiovascular status: blood pressure returned to baseline and stable Postop Assessment: adequate PO intake Anesthetic complications: no   No notable events documented.   Last Vitals:  Vitals:   02/24/24 0815 02/24/24 0825  BP: 121/64 117/65  Pulse: (!) 59 (!) 59  Resp: 14 16  Temp: (!) 36.3 C (!) 36.1 C  SpO2: 95% 96%    Last Pain:  Vitals:   02/24/24 0825  TempSrc: Temporal  PainSc: 0-No pain                 Charli Liberatore

## 2024-02-24 NOTE — Transfer of Care (Signed)
 Immediate Anesthesia Transfer of Care Note  Patient: Randy Kennedy  Procedure(s) Performed: CARPAL TUNNEL RELEASE (Right) US  INTRAOPERATIVE (Right)  Patient Location: PACU  Anesthesia Type:MAC  Level of Consciousness: drowsy  Airway & Oxygen Therapy: Patient Spontanous Breathing and Patient connected to nasal cannula oxygen  Post-op Assessment: Report given to RN and Post -op Vital signs reviewed and stable  Post vital signs: Reviewed and stable  Last Vitals:  Vitals Value Taken Time  BP 115/59 02/24/24 07:51  Temp 36.7 C 02/24/24 07:51  Pulse 60 02/24/24 07:54  Resp 13 02/24/24 07:54  SpO2 96 % 02/24/24 07:54  Vitals shown include unfiled device data.  Last Pain:  Vitals:   02/24/24 0751  TempSrc:   PainSc: Asleep         Complications: No notable events documented.

## 2024-02-24 NOTE — Interval H&P Note (Signed)
 History and Physical Interval Note:  02/24/2024 7:02 AM  Randy Kennedy  has presented today for surgery, with the diagnosis of Right carpal tunnel syndrome.  The various methods of treatment have been discussed with the patient and family. After consideration of risks, benefits and other options for treatment, the patient has consented to  Procedure(s) with comments: CARPAL TUNNEL RELEASE (Right) - RIGHT CARPAL TUNNEL RELEASE WITH ULTRASOUND GUIDANCE US  INTRAOPERATIVE (Right) as a surgical intervention.  The patient's history has been reviewed, patient examined, no change in status, stable for surgery.  I have reviewed the patient's chart and labs.  Questions were answered to the patient's satisfaction.    Heart and lungs clear   Penne LELON Sharps

## 2024-02-24 NOTE — Discharge Instructions (Signed)
 Your surgeon has performed an operation on the nerves in your upper extremity. Many times, patients feel better immediately after surgery and can "overdo it." Even if you feel well, it is important that you follow these activity guidelines. If you do not let your nerve heal properly from the surgery, you can increase the chance of return of your symptoms. The following are instructions to help in your recovery once you have been discharged from the hospital.  It is normal for your nerves to feel increased "tingling" after surgery or a sensation that the nerve is "waking up".  This generally will resolve in a short period of time, within 1 to 2 weeks.  * It is ok to take NSAIDs after surgery.  Activity    Increase physical activity slowly as tolerated.  Taking short walks is encouraged, but avoid strenuous exercise. Do not jog, run, bicycle, lift weights, or participate in any other exercises unless specifically allowed by your doctor. Avoid prolonged sitting, including car rides.  For the first few days after surgery, avoid any use of the extremity more than a glass of water or for eating.  Specific instructions were given to you based off of your specific procedure.  You should not drive until no longer taking any new pain medication.  You may shower three days after your surgery.  After showering, lightly dab your incision dry. Do not take a tub bath or go swimming for 3 weeks, or until approved by your doctor at your follow-up appointment.  If you smoke, we strongly recommend that you quit.  Smoking has been proven to interfere with normal healing in your back and will dramatically reduce the success rate of your surgery. Please contact [facility] (800-QUIT-NOW) and use the resources at www.[facility].com for assistance in stopping smoking.  Surgical Incision   If you have a dressing on your incision, you may remove it three days after your surgery. Keep your incision area clean and dry.  Your  sutures are under your skin and your wound is covered with surgical glue.  The glue should begin to peel away within about a week.   Diet            You may return to your usual diet. Be sure to stay hydrated.  When to Contact us  Although your surgery and recovery will likely be uneventful, you may have some residual numbness, aches, and pains in your back and/or legs. This is normal and should improve in the next few weeks.  However, should you experience any of the following, contact us immediately: New numbness or weakness Pain that is progressively getting worse, and is not relieved by your pain medications or rest Bleeding, redness, swelling, pain, or drainage from surgical incision Chills or flu-like symptoms Fever greater than 101.0 F (38.3 C) Problems with bowel or bladder functions Difficulty breathing or shortness of breath Warmth, tenderness, or swelling in your calf  Contact Information How to contact us:  If you have any questions/concerns before or after surgery, you can reach [place] at [phone], or you can send a mychart message. We can be reached by phone or mychart 8am-4pm, Monday-Friday.  *Please note: Calls after 4pm are forwarded to a third party answering service. Mychart messages are not routinely monitored during evenings, weekends, and holidays. Please call our office to contact the answering service for urgent concerns during non-business hours.

## 2024-02-24 NOTE — H&P (View-Only) (Signed)
 Referring Physician:  Rilla Baller, MD [address] [place],  [place] [address]  Primary Physician:  Rilla Baller, MD  History of Present Illness: 02/24/24  Mr. Randell Teare is here today with a chief complaint of severe bilateral carpal tunnel syndrome.  He has been dealing with this for over 3 years.  He is well-known to our service for multiple spine issues.  Had a previous left sided procedure done, here for the right   Review of Systems:  A 10 point review of systems is negative, except for the pertinent positives and negatives detailed in the HPI.  Past Medical History: Past Medical History:  Diagnosis Date   Allergic rhinitis    Arthritis    Bradycardia    Chest pain    a. 11/2011 normal stress test performed in [place]  - see chart.   Chronic fatigue    Chronic low back pain with bilateral sciatica    Chronic pain    L ankle and bilateral hips (remote fracture s/p surgeries), lower back (told has herniated disc)   Closed avulsion fracture of right talus 06/2016   COVID-19 06/2020   Diastolic dysfunction 02/26/2022   a.) TTE 02/26/2022: EF 60-65%, mod RVE, mod LAE, sev RAE, AoV sclerosis, G2DD   Dyspnea    Eczema    Erectile dysfunction 09/14/2012   Bertrum disease    Gout 2007   Hearing loss    otosclerosis   Heartburn    mild, controlled with pepcid    History of chicken pox    History of hepatitis B    as child (51 years of age), no sequelae   HLD (hyperlipidemia)    HTN (hypertension)    difficult to control - clonidine  and beta blockers caused bradycardia   Leg edema    Lobar pneumonia (HCC) 05/13/2022   Morbid obesity (HCC)    Motion sickness    Most moving vehicles   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 moderate, AHI 37.4 increased to 80 in REM, on BiPAP 14/10, 97% compliance >4 hrs (06/2013); b. Does not tolerate CPAP.   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 Treated with vicodin, prior tried fentanyl  and butrans.   Sinus node dysfunction (HCC)    a.) s/p Abbott Assurity MRI dual-chamber pacemaker placement 02/27/2022   Spondylolisthesis of lumbar region    Systolic murmur    Thrombocytopenia (HCC)    Wears dentures    Partial lower   Wears hearing aid in both ears    Does not wear all the time    Past Surgical History: Past Surgical History:  Procedure Laterality Date   APPLICATION OF INTRAOPERATIVE CT SCAN N/A 11/27/2022   Procedure: APPLICATION OF INTRAOPERATIVE CT SCAN;  Surgeon: Clois Fret, MD;  Location: [facility];  Service: Neurosurgery;  Laterality: N/A;   CATARACT EXTRACTION W/PHACO Right 11/26/2020   Procedure: CATARACT EXTRACTION PHACO AND INTRAOCULAR LENS PLACEMENT (IOC) RIGHT VIVITY TORIC LENS 7.17 00:49.6;  Surgeon: Jaye Fallow, MD;  Location: [facility];  Service: Ophthalmology;  Laterality: Right;  Latex Sleep Apnea   CATARACT EXTRACTION W/PHACO Left 12/10/2020   Procedure: CATARACT EXTRACTION PHACO AND INTRAOCULAR LENS PLACEMENT (IOC) LEFT VIVITY;  Surgeon: Jaye Fallow, MD;  Location: [facility];  Service: Ophthalmology;  Laterality: Left;  Latex Sleep apnea 5.37 00:32.7   COLONOSCOPY  02/2011   ext hem, benign polyp, rpt 5 yrs ([place] )   COLONOSCOPY WITH PROPOFOL  N/A 12/17/2020   multiple TA, ext hem, rpt 3 yrs (Vanga, Corinn Skiff, MD)   LEG SURGERY  x5   left - after fall at work (3.5 stories)   MANDIBLE SURGERY  1965   jaw fracture - horse kick   MOHS SURGERY Right 07/03/2021   Ear   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   Procedure:  PACEMAKER IMPLANT;  Surgeon: Inocencio Soyla Lunger, MD;  Location: [facility];  Service: Cardiovascular;  Laterality: N/A;   TRANSFORAMINAL LUMBAR INTERBODY FUSION W/ MIS 1 LEVEL N/A 11/27/2022   Procedure: L4-5 MINIMALLY INVASIVE (MIS) TRANSFORAMINAL LUMBAR INTERBODY FUSION (TLIF);  Surgeon: Clois Fret, MD;  Location: [facility];  Service: Neurosurgery;  Laterality: N/A;   TYMPANIC MEMBRANE REPAIR Left 1994   otosclerosis    Allergies: Allergies as of 01/03/2024 - Review Complete 12/13/2023  Allergen Reaction Noted   Amlodipine  Rash 06/24/2021   Allopurinol Other (See Comments) 09/13/2012   Lisinopril -hydrochlorothiazide   02/16/2013   Procardia  [nifedipine ] Other (See Comments) 07/12/2013   Clonidine  derivatives Rash 02/25/2022   Doxazosin  mesylate Rash 10/07/2017   Latex Rash 11/14/2020   Nortriptyline  Rash 07/01/2018   Uloric  [febuxostat ] Rash 05/07/2017    Medications:  Current Outpatient Medications:    aspirin  EC 81 MG tablet, Take 81 mg by mouth daily., Disp: , Rfl:    carvedilol  (COREG ) 25 MG tablet, Take 1 tablet (25 mg total) by mouth 2 (two) times daily with a meal., Disp: 180 tablet, Rfl: 3   colchicine  0.6 MG tablet, TAKE ONE TABLET BY MOUTH ONCE DAILY, Disp: 90 tablet, Rfl: 3   fexofenadine (ALLEGRA) 180 MG tablet, Take 180 mg by mouth every morning., Disp: , Rfl:    fluticasone  (FLONASE ) 50 MCG/ACT nasal spray, SPRAY 2 SPRAYS INTO EACH NOSTRIL EVERY DAY, Disp: 48 mL, Rfl: 0   gabapentin  (NEURONTIN ) 300 MG capsule, TAKE 1 CAPSULE BY MOUTH TWICE A DAY AND 2 CAPSULES AT BEDTIME, Disp: 360 capsule, Rfl: 1   hydrALAZINE  (APRESOLINE ) 100 MG tablet, Take 1 tablet (100 mg total) by mouth 3 (three) times daily., Disp: 270 tablet, Rfl: 3   minoxidil  (LONITEN ) 2.5 MG tablet, TAKE 1 TABLET BY MOUTH DAILY AT BEDTIME, Disp: 90 tablet, Rfl: 2   montelukast  (SINGULAIR ) 10 MG tablet, TAKE 1 TABLET BY MOUTH EVERYDAY AT BEDTIME, Disp: 90 tablet, Rfl: 0   nitroGLYCERIN   (NITROSTAT ) 0.4 MG SL tablet, Place 1 tablet (0.4 mg total) under the tongue every 5 (five) minutes as needed for chest pain., Disp: 25 tablet, Rfl: 4   pravastatin  (PRAVACHOL ) 40 MG tablet, Take 1 tablet (40 mg total) by mouth daily., Disp: 90 tablet, Rfl: 3   probenecid  (BENEMID ) 500 MG tablet, TAKE 1/2 TABLET TWICE A DAY BY MOUTH, Disp: 90 tablet, Rfl: 1   spironolactone  (ALDACTONE ) 25 MG tablet, Take 1 tablet (25 mg total) by mouth daily., Disp: 90 tablet, Rfl: 3   valsartan  (DIOVAN ) 320 MG tablet, Take 1 tablet (320 mg total) by mouth daily., Disp: 90 tablet, Rfl: 3   vitamin C (ASCORBIC ACID) 500 MG tablet, Take 500 mg by mouth  daily., Disp: , Rfl:   Social History: Social History   Tobacco Use   Smoking status: Former    Current packs/day: 0.00    Average packs/day: 1 pack/day for 4.0 years (4.0 ttl pk-yrs)    Types: Pipe, Cigarettes    Start date: 07/27/1974    Quit date: 07/27/1978    Years since quitting: 45.4   Smokeless tobacco: Never  Vaping Use   Vaping status: Never Used  Substance Use Topics   Alcohol use: No    Alcohol/week: 0.0 standard drinks of alcohol   Drug use: No    Family Medical History: Family History  Problem Relation Age of Onset   Cancer Mother        rectal   Hypertension Mother    Cancer Father 37       prostate   Cancer Paternal Uncle        prostate   Diabetes Neg Hx    Stroke Neg Hx    CAD Neg Hx     Physical Examination: There were no vitals filed for this visit.  General: Patient is in no apparent distress. Attention to examination is appropriate.  Neck:   Supple.  Full range of motion.  Respiratory: Patient is breathing without any difficulty.   NEUROLOGICAL:     Awake, alert, oriented to person, place, and time.  Speech is clear and fluent.   Cranial Nerves: Pupils equal round and reactive to light.  Facial tone is symmetric. Shoulder shrug is symmetric. Tongue protrusion is midline.  There is no pronator drift.  Motor Exam:   He shows mild approximately 4+ weakness in the bilateral median innervated thenar muscles with sparing of his ulnar innervated intrinsic hand muscles.  He does not have major wasting noted.  He has positive reverse Phalen sign and positive carpal compression test.  He has splitting of his ring finger sensory wise.   Medical Decision Making  Electrodiagnostics:  [facility]  [address] [place], [address]  [place], [place] [address] Tel: [phone] Fax: [phone] Test Date:  11/19/2023   Patient: Randy Kennedy DOB: 1951/02/01 Physician: Tonita Blanch, DO  Sex: Male Height: 5' 7 Ref Phys: Glade Boys, DEVONNA  ID#: [id number]     Technician:      History: This is a 73 year old male referred for evaluation of numbness and tingling involving both hands.   NCV & EMG Findings: Extensive electrodiagnostic testing of the right upper extremity and additional studies of the left shows:  Bilateral median sensory responses show prolonged latency (R5.8, L6.2 ms) and reduced amplitude (R5.5, L4.1 V).  Bilateral ulnar sensory responses are within normal limits. Bilateral median motor responses show prolonged latency (R5.1, L5.8 ms).  Bilateral ulnar motor responses are within normal limits. Chronic motor axonal loss changes are seen affecting bilateral abductor pollicis brevis muscles, without accompanying active denervation.   Impression: Bilateral median neuropathy at or distal to the wrist, consistent with a clinical diagnosis of carpal tunnel syndrome.  Overall, these findings are moderate-to-severe in degree electrically.     ___________________________ Tonita Blanch, DO  I have personally reviewed the images and electrodiagnostics and agree with the above interpretation.  Assessment and Plan: Mr. Hoglund is a pleasant 73 y.o. male with severe bilateral carpal tunnel syndrome left worse than right. He had the left decompressed and is now here for the right side.   On  physical examination he has mild weakness in his median innervated intrinsic musculature.  He does have decree  sensation as well subjectively approximately 70 to 80% loss.  His provocative signs are positive for carpal tunnel tests.  His EMG demonstrated moderately severe carpal tunnel bilaterally.  He is here today for right sided decompression  Thank you for involving me in the care of this patient.    Penne MICAEL Sharps MD/MSCR Neurosurgery - Peripheral Nerve Surgery

## 2024-02-24 NOTE — Progress Notes (Signed)
 ERx tirzepatide  as it appears pharmacy didn't receive prior Rx

## 2024-02-24 NOTE — Op Note (Signed)
 Indications: Patient with a history of median neuropathy at the wrist with hand weakness refractory to conservative management.  Findings: Severe compression of the median nerve at the transverse carpal ligament  Preoperative Diagnosis:Right carpal tunnel syndrome  Postoperative Diagnosis: Right carpal tunnel syndrome   Postoperative Diagnosis: same   EBL: Minimal IVF: See anesthesia report Drains: none Disposition:Stable to PACU Complications: none  No foley catheter was placed.   Preoperative Note: patient with a history of progressive right median neuropathy with hand weakness refractory to conservative management.  They had tried rest, padding, and watchful waiting but had continued progressive symptoms.  Given the progression of her median neuropathy plan was made for median nerve decompression  Risk of surgery is discussed and include: Infection, bleeding, wound healing issues, pillar pain nerve injury, pain, failure to relieve the symptoms, need for further surgery.  Procedure:  1) right sided carpal tunnel decompression with ultrasound guidance   Procedure: After obtaining informed consent, the patient taken to the operating room, placed in supine position, monitored anesthesia care was induced.  They were given preoperative antibiotics.  Prepped and draped in the usual fashion.  Comprehensive timeout was performed verifying the patient's name, MRN, planned procedure.  An ultrasound was used with a sterile probe.  We used this to mark out our safety points including the interval between the ulnar artery and median nerve.  We identified the motor branch as well as the first sensory branch which were both in safe position.  We also identified the vascular arcade which was in safe positioning as well.  At this point we placed a block with Marcaine  without epinephrine .  We blocked the skin where the incision would be, the superficial sensory median nerve, as well as the transverse  carpal ligament.  Under ultrasound guidance we utilized the local anesthetic to perform a hydrodissection of the nerve from the transverse carpal ligament.  We then prepped the sonexs ultra CTR knife on the back table while the anesthetic set in.  We performed a small linear incision approximately 2 to 3 mm.  We then utilized a Statistician under ultrasound guidance to identify the underside of the transverse carpal ligament.  The fat and connective tissue was dissected off the underside.  We could feel that this was quite thickened and calcified.  Causing severe compression.  Once we had a clear tract we then placed the ultrasound-guided knife into the incision and advanced it through the carpal tunnel.  We verified the safety zones including the median nerve which did not significantly cross over the knife.  We are able to see the first sensory branch which was not crossing the knife.  The artery was also in a safe place.  At this point we divided the transverse carpal ligament under ultrasound guidance, to get a full release it took 2 passes.  The nerve relaxed laterally and was well decompressed.  After the 2 passes we placed a Penfield 4 into the wound and were able to feel a complete dissection of the transverse carpal ligament.  We then irrigated, we got meticulous hemostasis.  Skin glue was placed on the incision and a Band-Aid was placed on top once this was dried.  No immediate complications.  Sponge and pattie counts were correct at the end of the procedure.   I performed the procedure without an assistant surgeon  Penne MICAEL Sharps, MD/MSCR

## 2024-02-24 NOTE — Anesthesia Preprocedure Evaluation (Addendum)
 Anesthesia Evaluation  Patient identified by MRN, date of birth, ID band Patient awake    Reviewed: Allergy & Precautions, NPO status , Patient's Chart, lab work & pertinent test results  History of Anesthesia Complications Negative for: history of anesthetic complications  Airway Mallampati: IV   Neck ROM: Full    Dental  (+) Missing   Pulmonary sleep apnea , former smoker (quit 1980)   Pulmonary exam normal breath sounds clear to auscultation       Cardiovascular hypertension, Normal cardiovascular exam+ pacemaker (SSS)  Rhythm:Regular Rate:Normal  ECG 08/30/23:  Atrial-paced rhythm with prolonged AV conduction Right bundle branch block Minimal voltage criteria for LVH, may be normal variant ( R in aVL ) T wave abnormality, consider lateral ischemia When compared with ECG of 28-Feb-2022 10:44, No significant change was found   Neuro/Psych Chronic pain; HOH    GI/Hepatic 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  Hypertension:  afternoon dose of hydralazine .  Was well-controlled this morning.elevated today.  He has not taken his afternoon dose of hydralazine .  Was well-controlled this morning.   3.  Preoperative evaluation: Patient has chronic shortness of breath but no chest pain.  His activity is limited due to his back pain.  He has plans for upcoming carpal tunnel surgery.  Ejection fraction was normal in 2023.  He would be at intermediate risk for an intermediate risk procedure.  No further cardiac testing is necessary at this time.    Disposition:   Follow up with EP APP 24 months   Reproductive/Obstetrics                              Anesthesia Physical Anesthesia Plan  ASA: 3  Anesthesia Plan: General   Post-op Pain Management:    Induction: Intravenous  PONV Risk Score and Plan: 2 and Propofol  infusion, TIVA and Treatment may vary due to age or medical condition  Airway Management Planned: Natural Airway  Additional Equipment:   Intra-op Plan:   Post-operative Plan:   Informed Consent: I have reviewed the patients History and Physical, chart, labs and discussed the procedure including the risks, benefits and alternatives for the proposed anesthesia with the patient or authorized representative who has indicated his/her understanding and acceptance.       Plan Discussed with: CRNA  Anesthesia Plan Comments: (LMA/GETA backup discussed.  Patient consented for risks of anesthesia including but not limited to:  - adverse reactions to medications - damage to eyes, teeth, lips or other oral mucosa - nerve damage due to positioning  - sore throat or hoarseness - damage to heart, brain, nerves, lungs, other parts of body or loss of life  Informed patient about role of CRNA in peri- and intra-operative care.  Patient voiced understanding.)         Anesthesia Quick Evaluation

## 2024-02-25 ENCOUNTER — Encounter: Payer: Self-pay | Admitting: Neurosurgery

## 2024-02-25 ENCOUNTER — Encounter: Payer: Self-pay | Admitting: Internal Medicine

## 2024-02-25 NOTE — Telephone Encounter (Signed)
 Patient has been scheduled for colonoscopy on 9/10 at 10:30 and PV on 8/27 at 1:00

## 2024-02-28 ENCOUNTER — Encounter: Admitting: Neurosurgery

## 2024-02-28 ENCOUNTER — Ambulatory Visit: Admitting: Family Medicine

## 2024-02-29 ENCOUNTER — Encounter (HOSPITAL_BASED_OUTPATIENT_CLINIC_OR_DEPARTMENT_OTHER): Attending: General Surgery | Admitting: General Surgery

## 2024-02-29 DIAGNOSIS — R7303 Prediabetes: Secondary | ICD-10-CM | POA: Diagnosis not present

## 2024-02-29 DIAGNOSIS — L97822 Non-pressure chronic ulcer of other part of left lower leg with fat layer exposed: Secondary | ICD-10-CM | POA: Insufficient documentation

## 2024-02-29 DIAGNOSIS — E669 Obesity, unspecified: Secondary | ICD-10-CM | POA: Insufficient documentation

## 2024-02-29 DIAGNOSIS — G629 Polyneuropathy, unspecified: Secondary | ICD-10-CM | POA: Insufficient documentation

## 2024-02-29 DIAGNOSIS — R600 Localized edema: Secondary | ICD-10-CM | POA: Diagnosis not present

## 2024-03-06 ENCOUNTER — Encounter (HOSPITAL_BASED_OUTPATIENT_CLINIC_OR_DEPARTMENT_OTHER): Admitting: General Surgery

## 2024-03-06 DIAGNOSIS — L97822 Non-pressure chronic ulcer of other part of left lower leg with fat layer exposed: Secondary | ICD-10-CM | POA: Diagnosis not present

## 2024-03-08 ENCOUNTER — Encounter: Admitting: Physician Assistant

## 2024-03-09 ENCOUNTER — Ambulatory Visit (INDEPENDENT_AMBULATORY_CARE_PROVIDER_SITE_OTHER): Payer: PPO

## 2024-03-09 DIAGNOSIS — I495 Sick sinus syndrome: Secondary | ICD-10-CM

## 2024-03-09 LAB — CUP PACEART REMOTE DEVICE CHECK
Battery Remaining Longevity: 103 mo
Battery Remaining Percentage: 84 %
Battery Voltage: 3.01 V
Brady Statistic AP VP Percent: 2.2 %
Brady Statistic AP VS Percent: 98 %
Brady Statistic AS VP Percent: 1 %
Brady Statistic AS VS Percent: 1 %
Brady Statistic RA Percent Paced: 99 %
Brady Statistic RV Percent Paced: 2.2 %
Date Time Interrogation Session: 20250814023612
Implantable Lead Connection Status: 753985
Implantable Lead Connection Status: 753985
Implantable Lead Implant Date: 20230804
Implantable Lead Implant Date: 20230804
Implantable Lead Location: 753859
Implantable Lead Location: 753860
Implantable Pulse Generator Implant Date: 20230804
Lead Channel Impedance Value: 460 Ohm
Lead Channel Impedance Value: 580 Ohm
Lead Channel Pacing Threshold Amplitude: 0.625 V
Lead Channel Pacing Threshold Amplitude: 0.75 V
Lead Channel Pacing Threshold Pulse Width: 0.5 ms
Lead Channel Pacing Threshold Pulse Width: 0.5 ms
Lead Channel Sensing Intrinsic Amplitude: 12 mV
Lead Channel Sensing Intrinsic Amplitude: 3.3 mV
Lead Channel Setting Pacing Amplitude: 0.875
Lead Channel Setting Pacing Amplitude: 1.75 V
Lead Channel Setting Pacing Pulse Width: 0.5 ms
Lead Channel Setting Sensing Sensitivity: 2 mV
Pulse Gen Model: 2272
Pulse Gen Serial Number: 8101269

## 2024-03-11 ENCOUNTER — Ambulatory Visit: Payer: Self-pay | Admitting: Cardiology

## 2024-03-13 ENCOUNTER — Encounter (HOSPITAL_BASED_OUTPATIENT_CLINIC_OR_DEPARTMENT_OTHER): Admitting: General Surgery

## 2024-03-13 DIAGNOSIS — I872 Venous insufficiency (chronic) (peripheral): Secondary | ICD-10-CM | POA: Diagnosis not present

## 2024-03-13 DIAGNOSIS — L97822 Non-pressure chronic ulcer of other part of left lower leg with fat layer exposed: Secondary | ICD-10-CM | POA: Diagnosis not present

## 2024-03-14 ENCOUNTER — Other Ambulatory Visit: Payer: Self-pay | Admitting: Family Medicine

## 2024-03-15 MED ORDER — TIRZEPATIDE-WEIGHT MANAGEMENT 5 MG/0.5ML ~~LOC~~ SOLN: 5.0000 mg | SUBCUTANEOUS | 1 refills | Status: DC

## 2024-03-20 ENCOUNTER — Ambulatory Visit (INDEPENDENT_AMBULATORY_CARE_PROVIDER_SITE_OTHER): Admitting: Physician Assistant

## 2024-03-20 ENCOUNTER — Encounter: Payer: Self-pay | Admitting: Physician Assistant

## 2024-03-20 VITALS — BP 126/82 | Ht 67.0 in | Wt 240.0 lb

## 2024-03-20 DIAGNOSIS — Z09 Encounter for follow-up examination after completed treatment for conditions other than malignant neoplasm: Secondary | ICD-10-CM

## 2024-03-20 DIAGNOSIS — G5601 Carpal tunnel syndrome, right upper limb: Secondary | ICD-10-CM

## 2024-03-20 DIAGNOSIS — R200 Anesthesia of skin: Secondary | ICD-10-CM

## 2024-03-20 NOTE — Progress Notes (Signed)
   REFERRING PHYSICIAN:  Rilla Baller, Md [address] [place],  [place] [address]  DOS:  02/24/24, R US  guided carpal tunnel release 01/18/24, L US  guided CTR  HISTORY OF PRESENT ILLNESS: Randy Kennedy is status post right ultrasound-guided carpal tunnel release 1 month ago.  He is doing very well.  He still has some residual numbness, but has had improvement compared to baseline.  He does not have significant back and leg pain discomfort, of note previous L4-5 TLIF and is currently being worked up for possible stimulator placement.     PHYSICAL EXAMINATION:  General: Patient is well developed, well nourished, calm, collected, and in no apparent distress.   NEUROLOGICAL:  General: In no acute distress.   Awake, alert, oriented to person, place, and time.  Pupils equal round and reactive to light.  Facial tone is symmetric.     Strength in bilateral hands are improved from baseline. Incisions well appearing  Strength: Walking with cane   ROS (Neurologic):  Negative except as noted above  IMAGING: No complications noted  CT - showed healing through the disc space  EMG NCV 01/21/2024 NCV & EMG Findings: Extensive electrodiagnostic testing of the left lower extremity and additional studies of the right shows:  Bilateral sural and superficial peroneal sensory responses are absent. Bilateral peroneal motor responses are absent at the extensor digitorum brevis, and normal at the tibialis anterior.  Bilateral tibial motor responses show reduced amplitude. Tibial H reflex study on the right is prolonged, and normal on the left. Chronic motor axonal loss changes are seen affecting the anterior tibialis, flexor digitorum longus, rectus femoris, and adductor longus muscles, worse on the left.  There is no evidence of accompanying active denervation.     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     ___________________________ Tonita Blanch, DO  ASSESSMENT/PLAN:  Randy Kennedy iis status post right ultrasound-guided carpal tunnel release 1 month ago.  He is doing very well.  He still has some residual numbness, but has had improvement compared to baseline.  He does not have significant back and leg pain discomfort, of note previous L4-5 TLIF and is currently being worked up for possible stimulator placement for postlaminectomy syndrome.  Lyle Decamp, PA-C Dept of Neurosurgery

## 2024-03-22 ENCOUNTER — Ambulatory Visit (AMBULATORY_SURGERY_CENTER)

## 2024-03-22 VITALS — Ht 67.0 in | Wt 219.0 lb

## 2024-03-22 DIAGNOSIS — Z800 Family history of malignant neoplasm of digestive organs: Secondary | ICD-10-CM

## 2024-03-22 DIAGNOSIS — Z860100 Personal history of colon polyps, unspecified: Secondary | ICD-10-CM

## 2024-03-22 MED ORDER — NA SULFATE-K SULFATE-MG SULF 17.5-3.13-1.6 GM/177ML PO SOLN: 1.0000 | Freq: Once | ORAL | 0 refills | Status: AC

## 2024-03-22 NOTE — Progress Notes (Signed)
 No egg or soy allergy known to patient  No issues known to pt with past sedation with any surgeries or procedures Patient denies ever being told they had issues or difficulty with intubation  No FH of Malignant Hyperthermia  Pt is on diet pills; Taking Zepbound  and hold instructions provided Pt is not on  home 02  Pt is not on blood thinners  Pt denies issues with constipation  No A fib or A flutter Have any cardiac testing pending--No Pt can ambulate with a cane ; states he can transfer and dress himself without assistance Pt denies use of chewing tobacco Discussed diabetic I weight loss medication holds Discussed NSAID holds Checked BMI Pt instructed to use [employer] or GoodRx for a price reduction on prep  Patient's chart reviewed by Norleen Schillings CNRA prior to previsit and patient appropriate for the [facility].  Pre visit completed and red dot placed by patient's name on their procedure day (on provider's schedule).

## 2024-03-23 ENCOUNTER — Encounter: Payer: Self-pay | Admitting: Internal Medicine

## 2024-03-24 ENCOUNTER — Ambulatory Visit (HOSPITAL_COMMUNITY)
Admission: RE | Admit: 2024-03-24 | Discharge: 2024-03-24 | Disposition: A | Discharge status: Home / Self Care | Source: Ambulatory Visit | Attending: Neurosurgery | Admitting: Neurosurgery

## 2024-03-24 DIAGNOSIS — G629 Polyneuropathy, unspecified: Secondary | ICD-10-CM

## 2024-03-24 DIAGNOSIS — M961 Postlaminectomy syndrome, not elsewhere classified: Secondary | ICD-10-CM

## 2024-03-24 NOTE — Progress Notes (Addendum)
 Patient was monitored by this RN during MRI scan due to presence of a pacemaker. Cardiac rhythm was continuously monitored throughout the procedure. Prior to the start of the scan, the pacemaker was placed in MRI-safe mode by the MRI technician and/or pacemaker representative. Following the completion of the scan, the device was returned to its pre-MRI settings. Neurological status and orientation post-procedure were unchanged from baseline.

## 2024-03-24 NOTE — CV Procedure (Signed)
  Device system confirmed to be MRI conditional, with implant date > 6 weeks ago, and no evidence of abandoned or epicardial leads in review of most recent CXR  Device last cleared by EP Provider: Prentice Passey 03/24/24- Pt had MRI in May 2025, cleared to proceed with MRI  Clearance is good through for 1 year as long as parameters remain stable at time of check. If pt undergoes a cardiac device procedure during that time, they should be re-cleared.   Tachy-therapies to be programmed off if applicable with device back to pre-MRI settings after completion of exam.  [employer] will be present for programming for the MRI.   Izetta CHRISTELLA Linen, RT  03/24/2024 1:36 PM

## 2024-03-28 ENCOUNTER — Encounter (HOSPITAL_BASED_OUTPATIENT_CLINIC_OR_DEPARTMENT_OTHER): Attending: General Surgery | Admitting: General Surgery

## 2024-03-28 DIAGNOSIS — G629 Polyneuropathy, unspecified: Secondary | ICD-10-CM | POA: Diagnosis not present

## 2024-03-28 DIAGNOSIS — L97822 Non-pressure chronic ulcer of other part of left lower leg with fat layer exposed: Secondary | ICD-10-CM | POA: Diagnosis not present

## 2024-03-28 DIAGNOSIS — F5101 Primary insomnia: Secondary | ICD-10-CM | POA: Diagnosis not present

## 2024-03-28 DIAGNOSIS — R609 Edema, unspecified: Secondary | ICD-10-CM | POA: Insufficient documentation

## 2024-03-28 DIAGNOSIS — I872 Venous insufficiency (chronic) (peripheral): Secondary | ICD-10-CM | POA: Diagnosis not present

## 2024-03-28 DIAGNOSIS — E669 Obesity, unspecified: Secondary | ICD-10-CM | POA: Diagnosis not present

## 2024-03-31 ENCOUNTER — Encounter: Payer: Self-pay | Admitting: Family Medicine

## 2024-03-31 ENCOUNTER — Ambulatory Visit: Admitting: Family Medicine

## 2024-03-31 DIAGNOSIS — E66811 Obesity, class 1: Secondary | ICD-10-CM

## 2024-03-31 NOTE — Patient Instructions (Addendum)
 You are doing well! Increase zepbound  to 5mg  weekly next dose - take this for 2 months, let us  know if any trouble tolerating this.  Return in 6 weeks for weight management follow up visit.

## 2024-03-31 NOTE — Assessment & Plan Note (Signed)
 Tolerating zepbound  2.5mg  well, with 18 lb weight loss over the past 1 month. Planned increase to 5mg  weekly, then return in 6 wks for weight management f/u visit.  Reviewed importance of strength training to preserve muscle mass in zepbound  use.

## 2024-03-31 NOTE — Progress Notes (Signed)
 Ph: [phone] Fax: [phone]   Patient ID: Randy Kennedy, male    DOB: 05/19/1951, 73 y.o.   MRN: [id number]  This visit was conducted in person.  BP 130/68   Pulse 61   Temp 97.9 F (36.6 C) (Oral)   Ht 5' 7 (1.702 m)   Wt 223 lb (101.2 kg)   SpO2 97%   BMI 34.93 kg/m    CC: 4-6 wk weight management f/u Subjective:   HPI: Randy Kennedy is a 73 y.o. male presenting on 03/31/2024 for Medical Management of Chronic Issues (Here for 4-6 wk wt mgmt f/u.)   Starting weight: 241 lbs, BMI 37.8 Last weight: 241 lbs Today's weight 223 lbs  Zepbound  started 02/22/2024, continues 2.5mg  weekly, with plan to start 5mg  weekly dose.  Tolerating well without nausea, diarrhea, constipation, epigastric pain.   24 hour recall: Not done He's changed diet - has significant increased protein intake in diet for wound healing.  Continues drinking water .   Activity regimen: Activity significantly limited due to limitations from chronic back pain   Recently completed carpal tunnel release L side 12/2023, R side 02/24/2024 by Dr Penne Sharps - wounds healed up well.  Completed NCS/EMG 12/2023 - discussing possible spinal cord stimulator for post-laminectomy syndrome (Neurosurgery).   Upcoming colonoscopy.      Relevant past medical, surgical, family and social history reviewed and updated as indicated. Interim medical history since our last visit reviewed. Allergies and medications reviewed and updated. Outpatient Medications Prior to Visit  Medication Sig Dispense Refill   aspirin  EC 81 MG tablet Take 81 mg by mouth daily.     carvedilol  (COREG ) 25 MG tablet Take 1 tablet (25 mg total) by mouth 2 (two) times daily with a meal. 180 tablet 3   colchicine  0.6 MG tablet Take 1 tablet (0.6 mg total) by mouth daily as needed (gout flare). 30 tablet 3   fexofenadine (ALLEGRA) 180 MG tablet Take 180 mg by mouth every morning.     fluticasone  (FLONASE ) 50 MCG/ACT nasal spray SPRAY 2  SPRAYS INTO EACH NOSTRIL EVERY DAY 48 mL 3   gabapentin  (NEURONTIN ) 300 MG capsule Take 1 capsule (300 mg total) by mouth 2 (two) times daily AND 2-3 capsules (600-900 mg total) at bedtime. 480 capsule 3   hydrALAZINE  (APRESOLINE ) 100 MG tablet Take 1 tablet (100 mg total) by mouth 3 (three) times daily. 270 tablet 3   minoxidil  (LONITEN ) 2.5 MG tablet TAKE 1 TABLET BY MOUTH DAILY AT BEDTIME 90 tablet 2   montelukast  (SINGULAIR ) 10 MG tablet Take 1 tablet (10 mg total) by mouth at bedtime. 90 tablet 3   nitroGLYCERIN  (NITROSTAT ) 0.4 MG SL tablet Place 1 tablet (0.4 mg total) under the tongue every 5 (five) minutes as needed for chest pain. 25 tablet 4   pravastatin  (PRAVACHOL ) 40 MG tablet Take 1 tablet (40 mg total) by mouth daily. 90 tablet 3   probenecid  (BENEMID ) 500 MG tablet TAKE 1/2 TABLET TWICE A DAY BY MOUTH 90 tablet 3   spironolactone  (ALDACTONE ) 25 MG tablet Take 1 tablet (25 mg total) by mouth daily. 90 tablet 3   [START ON 04/12/2024] tirzepatide  5 MG/0.5ML injection vial Inject 5 mg into the skin once a week. 2 mL 1   valsartan  (DIOVAN ) 320 MG tablet Take 1 tablet (320 mg total) by mouth daily. 90 tablet 3   vitamin C (ASCORBIC ACID) 500 MG tablet Take 500 mg by mouth daily.  ZEPBOUND  2.5 MG/0.5ML injection vial INJECT 0.5 ML (2.5 MG) UNDER THE SKIN ONCE WEEKLY (0.5ML= 50 UNITS) 2 mL 0   No facility-administered medications prior to visit.     Per HPI unless specifically indicated in ROS section below Review of Systems  Objective:  BP 130/68   Pulse 61   Temp 97.9 F (36.6 C) (Oral)   Ht 5' 7 (1.702 m)   Wt 223 lb (101.2 kg)   SpO2 97%   BMI 34.93 kg/m   Wt Readings from Last 3 Encounters:  03/31/24 223 lb (101.2 kg)  03/22/24 219 lb (99.3 kg)  03/20/24 240 lb (108.9 kg)      Physical Exam Vitals and nursing note reviewed.  Constitutional:      Appearance: Normal appearance. He is not ill-appearing.  HENT:     Head: Normocephalic and atraumatic.      Mouth/Throat:     Mouth: Mucous membranes are moist.     Pharynx: Oropharynx is clear. No oropharyngeal exudate or posterior oropharyngeal erythema.  Eyes:     Extraocular Movements: Extraocular movements intact.     Pupils: Pupils are equal, round, and reactive to light.  Cardiovascular:     Rate and Rhythm: Normal rate and regular rhythm.     Pulses: Normal pulses.     Heart sounds: Normal heart sounds. No murmur heard. Pulmonary:     Effort: Pulmonary effort is normal. No respiratory distress.     Breath sounds: Normal breath sounds. No wheezing, rhonchi or rales.  Musculoskeletal:     Right lower leg: No edema.     Left lower leg: No edema.  Skin:    General: Skin is warm and dry.     Findings: No rash.  Neurological:     Mental Status: He is alert.  Psychiatric:        Mood and Affect: Mood normal.        Behavior: Behavior normal.         Assessment & Plan:   Problem List Items Addressed This Visit     Obesity, Class I, BMI 30-34.9   Tolerating zepbound  2.5mg  well, with 18 lb weight loss over the past 1 month. Planned increase to 5mg  weekly, then return in 6 wks for weight management f/u visit.  Reviewed importance of strength training to preserve muscle mass in zepbound  use.       Morbid obesity (HCC) - Primary   Baseline BMI 37.8 (02/22/2024)        No orders of the defined types were placed in this encounter.   No orders of the defined types were placed in this encounter.   Patient Instructions  You are doing well! Increase zepbound  to 5mg  weekly next dose - take this for 2 months, let us  know if any trouble tolerating this.  Return in 6 weeks for weight management follow up visit.   Follow up plan: Return in about 6 weeks (around 05/12/2024) for follow up visit.  Anton Blas, MD

## 2024-03-31 NOTE — Assessment & Plan Note (Signed)
 Baseline BMI 37.8 (02/22/2024)

## 2024-04-01 ENCOUNTER — Encounter: Payer: Self-pay | Admitting: Neurosurgery

## 2024-04-05 ENCOUNTER — Encounter: Admitting: Internal Medicine

## 2024-04-05 ENCOUNTER — Encounter: Admitting: Neurosurgery

## 2024-04-10 ENCOUNTER — Ambulatory Visit (INDEPENDENT_AMBULATORY_CARE_PROVIDER_SITE_OTHER): Admitting: Neurosurgery

## 2024-04-10 ENCOUNTER — Encounter (HOSPITAL_BASED_OUTPATIENT_CLINIC_OR_DEPARTMENT_OTHER): Admitting: General Surgery

## 2024-04-10 ENCOUNTER — Encounter: Payer: Self-pay | Admitting: Neurosurgery

## 2024-04-10 VITALS — BP 104/60 | Temp 97.9°F | Ht 67.0 in | Wt 217.5 lb

## 2024-04-10 DIAGNOSIS — G5602 Carpal tunnel syndrome, left upper limb: Secondary | ICD-10-CM

## 2024-04-10 DIAGNOSIS — I872 Venous insufficiency (chronic) (peripheral): Secondary | ICD-10-CM | POA: Diagnosis not present

## 2024-04-10 DIAGNOSIS — Z9889 Other specified postprocedural states: Secondary | ICD-10-CM

## 2024-04-10 DIAGNOSIS — R200 Anesthesia of skin: Secondary | ICD-10-CM

## 2024-04-10 DIAGNOSIS — Z09 Encounter for follow-up examination after completed treatment for conditions other than malignant neoplasm: Secondary | ICD-10-CM

## 2024-04-10 DIAGNOSIS — L97822 Non-pressure chronic ulcer of other part of left lower leg with fat layer exposed: Secondary | ICD-10-CM | POA: Diagnosis not present

## 2024-04-10 DIAGNOSIS — G5603 Carpal tunnel syndrome, bilateral upper limbs: Secondary | ICD-10-CM

## 2024-04-10 DIAGNOSIS — G5601 Carpal tunnel syndrome, right upper limb: Secondary | ICD-10-CM

## 2024-04-10 NOTE — Progress Notes (Signed)
   REFERRING PHYSICIAN:  Rilla Baller, Md [address] [place],  [place] [address]  DOS:  02/24/24, R US  guided carpal tunnel release 01/18/24, L US  guided CTR  HISTORY OF PRESENT ILLNESS: Randy Kennedy is status post right ultrasound-guided carpal tunnel release.  Overall he states that his nighttime symptoms have improved significantly.  He is able to use his cane again.  Overall he is happy with his improvement.  He does continue to have some mild symptoms however these are much better than they were preoperatively.   PHYSICAL EXAMINATION:  General: Patient is well developed, well nourished, calm, collected, and in no apparent distress.   NEUROLOGICAL:  General: In no acute distress.   Awake, alert, oriented to person, place, and time.  Pupils equal round and reactive to light.  Facial tone is symmetric.     Strength in bilateral hands are improved from baseline. Incisions well appearing  Strength: Walking with cane   ROS (Neurologic):  Negative except as noted above  IMAGING: No complications noted  CT - showed healing through the disc space  EMG NCV 01/21/2024 NCV & EMG Findings: Extensive electrodiagnostic testing of the left lower extremity and additional studies of the right shows:  Bilateral sural and superficial peroneal sensory responses are absent. Bilateral peroneal motor responses are absent at the extensor digitorum brevis, and normal at the tibialis anterior.  Bilateral tibial motor responses show reduced amplitude. Tibial H reflex study on the right is prolonged, and normal on the left. Chronic motor axonal loss changes are seen affecting the anterior tibialis, flexor digitorum longus, rectus femoris, and adductor longus muscles, worse on the left.  There is no evidence of accompanying active denervation.     Impression: Chronic sensorimotor axonal polyneuropathy affecting the lower extremities. Superimposed chronic L4 radiculopathy affecting  bilateral lower extremities, worse on the left.     ___________________________ Tonita Blanch, DO  ASSESSMENT/PLAN:  Randy Kennedy status post bilateral ultrasound-guided carpal tunnel release.  Overall he is doing significantly better.  He states that his hands have improved significantly although he does continue to have some mild persistent symptoms.  At this point given his moderate to severe diagnosis I let him know that it is likely that he would continue to have some residual symptoms.  Overall he is happy with his outcome and feels that he has had a significant improvement.  Randy MICAEL Sharps, MD Dept of Neurosurgery

## 2024-04-11 ENCOUNTER — Other Ambulatory Visit: Payer: Self-pay | Admitting: Student in an Organized Health Care Education/Training Program

## 2024-04-11 ENCOUNTER — Ambulatory Visit: Admitting: Student in an Organized Health Care Education/Training Program

## 2024-04-11 ENCOUNTER — Ambulatory Visit
Admission: RE | Admit: 2024-04-11 | Discharge: 2024-04-11 | Disposition: A | Discharge status: Home / Self Care | Source: Ambulatory Visit | Attending: Student in an Organized Health Care Education/Training Program | Admitting: Student in an Organized Health Care Education/Training Program

## 2024-04-11 ENCOUNTER — Encounter: Payer: Self-pay | Admitting: Student in an Organized Health Care Education/Training Program

## 2024-04-11 VITALS — BP 114/71 | HR 60 | Temp 97.2°F | Resp 16 | Ht 67.0 in | Wt 217.0 lb

## 2024-04-11 DIAGNOSIS — G8929 Other chronic pain: Secondary | ICD-10-CM | POA: Diagnosis not present

## 2024-04-11 DIAGNOSIS — R52 Pain, unspecified: Secondary | ICD-10-CM | POA: Insufficient documentation

## 2024-04-11 DIAGNOSIS — G894 Chronic pain syndrome: Secondary | ICD-10-CM | POA: Insufficient documentation

## 2024-04-11 DIAGNOSIS — M961 Postlaminectomy syndrome, not elsewhere classified: Secondary | ICD-10-CM | POA: Insufficient documentation

## 2024-04-11 DIAGNOSIS — M5416 Radiculopathy, lumbar region: Secondary | ICD-10-CM

## 2024-04-11 NOTE — Progress Notes (Signed)
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, Heartburn, Hepatitis, History of bilateral cataract extraction (11/2020), History of chicken pox, History of hepatitis B, HLD (hyperlipidemia), HTN (hypertension), Leg edema, Lobar pneumonia (HCC) (05/13/2022), Long-term use of aspirin  therapy, Morbid obesity (HCC), Motion sickness, Narcolepsy (2006), Obesity, OSA (obstructive sleep apnea) (11/2011 sleep study), Peripheral neuropathy, Pneumonia, Presence of cardiac pacemaker (02/27/2022), RBBB, RBBB (right bundle branch block), RLS (restless legs syndrome), RSD lower limb (12/14/2014), Sinus node  dysfunction (HCC), Sleep apnea (2008), Spondylolisthesis of lumbar region, Systolic murmur, Thrombocytopenia (HCC), Wears dentures, and Wears hearing aid in both ears. Family: family history includes Cancer in his mother and paternal uncle; Cancer (age of onset: 15) in his father; Colon cancer in his mother; Heart attack in his maternal aunt; Hypertension in his father, mother, and sister.  Past Surgical History:  Procedure Laterality Date   APPLICATION OF INTRAOPERATIVE CT SCAN N/A 11/27/2022   Procedure: APPLICATION OF INTRAOPERATIVE CT SCAN;  Surgeon: Clois Fret, MD;  Location: [facility];  Service: Neurosurgery;  Laterality: N/A;   CARPAL TUNNEL RELEASE Left 01/18/2024   Procedure: CARPAL TUNNEL RELEASE;  Surgeon: Claudene Penne ORN, MD;  Location: [facility];  Service: Neurosurgery;  Laterality: Left;  MIS LEFT CARPAL TUNNELTUNNEL RELEASE WITH ULTRASOUND, POSSIBLE CONVERTION TO OPEN   CARPAL TUNNEL RELEASE Right 02/24/2024   Procedure: CARPAL TUNNEL RELEASE;  Surgeon: Claudene Penne ORN, MD;  Location: [facility];  Service: Neurosurgery;  Laterality: Right;  RIGHT CARPAL TUNNEL RELEASE WITH ULTRASOUND GUIDANCE   CATARACT EXTRACTION W/PHACO Right 11/26/2020   Procedure: CATARACT EXTRACTION PHACO AND INTRAOCULAR LENS PLACEMENT (IOC) RIGHT VIVITY TORIC LENS 7.17 00:49.6;  Surgeon: Jaye Fallow, MD;  Location: [facility];  Service: OphthalmologY   CATARACT EXTRACTION W/PHACO Left 12/10/2020   Procedure: CATARACT EXTRACTION PHACO AND INTRAOCULAR LENS PLACEMENT (IOC) LEFT VIVITY;  Surgeon: Jaye Fallow, MD;  Location: [facility];  Service: Ophthalmology; LENS 5.3700:32.7   COLONOSCOPY  02/2011   ext hem, benign polyp, rpt 5 yrs ([place] )   COLONOSCOPY WITH PROPOFOL  N/A 12/17/2020   multiple TA, ext hem, rpt 3 yrs ([place], Corinn Skiff, MD)   FRACTURE SURGERY  1985   INSERT / REPLACE / REMOVE PACEMAKER  Sept 2023   LEG SURGERY  x5   left - after fall at work (3.5 stories)    MANDIBLE SURGERY  1965   jaw fracture - horse kick   MOHS SURGERY Right 07/03/2021   Ear  OPERATIVE ULTRASOUND Right 02/24/2024   Procedure: US  INTRAOPERATIVE;  Surgeon: Claudene Penne ORN, MD;  Location: [facility];  Service: Neurosurgery;  Laterality: Right;   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   Procedure: PACEMAKER IMPLANT;  Surgeon: Inocencio Soyla Lunger, MD;  [facility];  Service: Cardiovascular;  Laterality: N/A;   TRANSFORAMINAL LUMBAR INTERBODY FUSION W/ MIS 1 LEVEL N/A 11/27/2022   Procedure: L4-5 MINIMALLY INVASIVE (MIS) TRANSFORAMINAL LUMBAR INTERBODY FUSION (TLIF);  Surgeon: Clois Fret, MD;  Location: [facility];  Service: Neurosurgery;  Laterality: N/A;   TYMPANIC MEMBRANE REPAIR Left 1994   otosclerosis   Active Ambulatory Problems    Diagnosis Date Noted   GERD (gastroesophageal reflux disease)    HTN (hypertension)    Dyslipidemia    OSA (obstructive sleep apnea)    RLS (restless legs syndrome)    Gilbert disease    Systolic murmur    Gout 09/13/2012   Erectile dysfunction 09/14/2012   Leg edema 02/16/2013   Drooping eyelid 03/09/2013   History of ankle fracture 07/19/2013   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   Thrombocytopenia (HCC) 08/05/2015   Medicare annual wellness visit, subsequent 08/14/2015   Encounter for general adult medical examination with abnormal findings 08/14/2015   Advanced care planning/counseling discussion 08/14/2015   Bunion 10/22/2015   Cough 10/22/2015   Narcolepsy    Chronic fatigue 03/20/2016   Encounter for chronic pain management 10/11/2016   Obesity, Class I, BMI 30-34.9 01/07/2017   Perennial allergic rhinitis 04/20/2018   Shortness of breath 07/05/2018   Pain and swelling of elbow, left 08/04/2018   Myalgia 08/26/2020   Burning sensation of skin 08/26/2020   Chronic pain syndrome 08/26/2020   Hearing loss 10/31/2020   Open wound of right external ear 10/31/2020   Left testicular pain 10/31/2020   Family history of  colon cancer in mother    Chronic radicular lumbar pain 12/16/2021   Sinus bradycardia 02/25/2022   Status cardiac pacemaker 03/18/2022   S/P spinal fusion 11/27/2022   Spondylolisthesis of lumbar region 11/27/2022   Chronic bilateral low back pain with bilateral sciatica 11/27/2022   Chronic pain of left groin 10/25/2023   Hematospermia 10/25/2023   Callus of foot 10/25/2023   Bilateral carpal tunnel syndrome 01/03/2024   Right carpal tunnel syndrome 01/31/2024   Leg wound, left, initial encounter 02/07/2024   Prediabetes 02/22/2024   Morbid obesity (HCC) 03/31/2024   Lumbar post-laminectomy syndrome 04/11/2024   Resolved Ambulatory Problems    Diagnosis Date Noted   Opiate use    Acute bronchitis 08/02/2012   Chest tightness 02/16/2013   Bradycardia 02/16/2013   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   Dizziness 03/24/2016   Bursitis disorder 01/07/2017   Bursitis of right hip 03/31/2017   Nasal congestion 05/19/2017   Right leg pain 10/07/2017   Acute upper respiratory infection of multiple sites 12/23/2017   Lateral pain of right hip 08/29/2019   Occult blood positive stool    Acute left ankle pain 03/28/2021   Lower respiratory infection 05/13/2022   Lobar pneumonia (HCC) 05/13/2022   Pre-op evaluation 11/19/2022   Hand weakness 01/03/2024   Past Medical History:  Diagnosis Date   Allergic rhinitis    Anginal pain (HCC)    Arthritis    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    Carpal tunnel syndrome    Cataract 2015   Chest pain    Chronic low back pain with bilateral sciatica    Chronic pain  Closed avulsion fracture of right talus 06/2016   COVID-19 06/2020   Diastolic dysfunction 02/26/2022   Dyspnea    Eczema    Heartburn    Hepatitis    History of bilateral cataract extraction 11/2020   History of chicken pox    History of hepatitis B    HLD (hyperlipidemia)    Long-term use of aspirin  therapy    Motion sickness    Obesity    Peripheral neuropathy     Pneumonia    Presence of cardiac pacemaker 02/27/2022   RBBB    RBBB (right bundle branch block)    Sinus node dysfunction (HCC)    Sleep apnea 2008   Wears dentures    Wears hearing aid in both ears    Constitutional Exam  General appearance: Well nourished, well developed, and well hydrated. In no apparent acute distress Vitals:   04/11/24 0920  BP: 114/71  Pulse: 60  Resp: 16  Temp: (!) 97.2 F (36.2 C)  SpO2: 97%  Weight: 217 lb (98.4 kg)  Height: 5' 7 (1.702 m)   BMI Assessment: Estimated body mass index is 33.99 kg/m as calculated from the following:   Height as of this encounter: 5' 7 (1.702 m).   Weight as of this encounter: 217 lb (98.4 kg).  BMI interpretation table: BMI level Category Range association with higher incidence of chronic pain  <18 kg/m2 Underweight   18.5-24.9 kg/m2 Ideal body weight   25-29.9 kg/m2 Overweight Increased incidence by 20%  30-34.9 kg/m2 Obese (Class I) Increased incidence by 68%  35-39.9 kg/m2 Severe obesity (Class II) Increased incidence by 136%  >40 kg/m2 Extreme obesity (Class III) Increased incidence by 254%   Patient's current BMI Ideal Body weight  Body mass index is 33.99 kg/m. Ideal body weight: 66.1 kg (145 lb 11.6 oz) Adjusted ideal body weight: 79 kg (174 lb 3.8 oz)   BMI Readings from Last 4 Encounters:  04/11/24 33.99 kg/m  04/10/24 34.07 kg/m  03/31/24 34.93 kg/m  03/22/24 34.30 kg/m   Wt Readings from Last 4 Encounters:  04/11/24 217 lb (98.4 kg)  04/10/24 217 lb 8 oz (98.7 kg)  03/31/24 223 lb (101.2 kg)  03/22/24 219 lb (99.3 kg)    Psych/Mental status: Alert, oriented x 3 (person, place, & time)       Eyes: PERLA Respiratory: No evidence of acute respiratory distress  Lumbar Spine Area Exam  Skin & Axial Inspection: No masses, redness, or swelling Alignment: Symmetrical Functional ROM: Pain restricted ROM affecting both sides Stability: No instability detected Muscle Tone/Strength:  Functionally intact. No obvious neuro-muscular anomalies detected. Sensory (Neurological): Neurogenic pain pattern Left Palpation: No palpable anomalies        Gait & Posture Assessment  Ambulation: Limited Gait: Antalgic gait (limping) Posture: Difficulty standing up straight, due to pain  Lower Extremity Exam    Side: Right lower extremity  Side: Left lower extremity  Stability: No instability observed          Stability: No instability observed          Skin & Extremity Inspection: Skin color, temperature, and hair growth are WNL. No peripheral edema or cyanosis. No masses, redness, swelling, asymmetry, or associated skin lesions. No contractures.  Skin & Extremity Inspection: Skin color, temperature, and hair growth are WNL. No peripheral edema or cyanosis. No masses, redness, swelling, asymmetry, or associated skin lesions. No contractures.  Functional ROM: Unrestricted ROM  Functional ROM: Pain restricted ROM for all joints of the lower extremity          Muscle Tone/Strength: Functionally intact. No obvious neuro-muscular anomalies detected.  Muscle Tone/Strength: Functionally intact. No obvious neuro-muscular anomalies detected.  Sensory (Neurological): Unimpaired        Sensory (Neurological): Neurogenic pain pattern        DTR: Patellar: deferred today Achilles: deferred today Plantar: deferred today  DTR: Patellar: deferred today Achilles: deferred today Plantar: deferred today  Palpation: No palpable anomalies  Palpation: No palpable anomalies    Assessment  Primary Diagnosis & Pertinent Problem List: The primary encounter diagnosis was Failed back surgical syndrome. Diagnoses of Lumbar post-laminectomy syndrome, Chronic radicular lumbar pain, and Chronic pain syndrome were also pertinent to this visit.  Visit Diagnosis (New problems to examiner): 1. Failed back surgical syndrome   2. Lumbar post-laminectomy syndrome   3. Chronic radicular lumbar pain    4. Chronic pain syndrome    Plan of Care (Initial workup plan)  The patient presents with chronic, intractable pain involving both mechanical low back pain and neurogenic components radiating into the lower extremities. Given the patient's history of failed back surgery syndrome and radiculopathy, and limited response to conservative and interventional therapies, a spinal cord stimulator (SCS) trial is being considered. While SCS is typically more effective for neuropathic and appendicular pain, we discussed that some patients may also experience relief in axial low back and hip pain. The potential benefits and risks of spinal cord stimulation were thoroughly reviewed.  The proposed plan includes a percutaneous spinal cord stimulator trial. The patient was informed that this will involve temporary placement of epidural leads connected to an external pulse generator, which will be used over a 7-day trial period. We discussed the possibility of a mid-trial in-office visit to adjust settings and optimize programming in order to give the patient the best chance of success. The patient will receive daily support from the device representative throughout the trial.  We reviewed the benefits of SCS, which include potential substantial pain relief, reduction in the use of oral pain medications including opioids, and long-term programmable therapy that can reduce reliance on repeated injections and other pain interventions. Risks were also discussed in detail and include potential surgical complications such as infection, bleeding, CSF leak, lead migration or fracture, hardware malfunction, and the possibility of either no pain relief or worsening of symptoms. The patient was advised that spinal cord stimulation is not a guaranteed solution or a "magic bullet," but rather a potentially valuable therapy in appropriately selected cases.  As part of standard protocol, pt has completed comprehensive psychosocial and  behavioral evaluation prior to the trial. Thoracic MRI wnl. We had a thorough and detailed discussion reviewing the rationale, alternatives, risks, and expected outcomes. The patient stated that all questions were answered to their satisfaction, demonstrated appropriate understanding, and expressed readiness to proceed. There were no barriers to understanding the treatment plan, and the explanation was well received. The patient is eager to move forward with the spinal cord stimulator trial once the necessary evaluation is complete.   Procedure Orders         [place] TRIAL      Orders Placed This Encounter  Procedures   [place] TRIAL    Contact medical implant company representative to notify them of the scheduled case and to make sure they will be available to provide required equipment.    Standing Status:   Future    Expected Date:  04/24/2024    Expiration Date:   04/11/2025    Scheduling Instructions:     Side: Bilateral     Level: Lumbar     Device: Medtronic     Sedation: With sedation     Timeframe: As soon as pre-approved    Where will this procedure be performed?:   [facility]    Provider-requested follow-up: Return in about 13 days (around 04/24/2024) for Medtronic SCS trial, ECT.  Future Appointments  Date Time Provider Department Center  04/24/2024  9:15 AM Marolyn Nest, MD [facility]  06/08/2024  7:50 AM [facility] DEVICE REMOTES [facility]  09/07/2024  7:50 AM [facility] DEVICE REMOTES [facility]  10/09/2024  1:00 PM [facility] ANNUAL WELLNESS VISIT 1 [facility] [address]  12/07/2024  7:50 AM [facility] DEVICE REMOTES [facility]   I discussed the assessment and treatment plan with the patient. The patient was provided an opportunity to ask questions and all were answered. The patient agreed with the plan and demonstrated an understanding of the instructions.  Patient advised to call back or seek an in-person evaluation if the symptoms or condition worsens.  Duration  of encounter: .  Total time on encounter, as per AMA guidelines included both the face-to-face and non-face-to-face time personally spent by the physician and/or other qualified health care professional(s) on the day of the encounter (includes time in activities that require the physician or other qualified health care professional and does not include time in activities normally performed by clinical staff). Physician's time may include the following activities when performed: Preparing to see the patient (e.g., pre-charting review of records, searching for previously ordered imaging, lab work, and nerve conduction tests) Review of prior analgesic pharmacotherapies. Reviewing PMP Interpreting ordered tests (e.g., lab work, imaging, nerve conduction tests) Performing post-procedure evaluations, including interpretation of diagnostic procedures Obtaining and/or reviewing separately obtained history Performing a medically appropriate examination and/or evaluation Counseling and educating the patient/family/caregiver Ordering medications, tests, or procedures Referring and communicating with other health care professionals (when not separately reported) Documenting clinical information in the electronic or other health record Independently interpreting results (not separately reported) and communicating results to the patient/ family/caregiver Care coordination (not separately reported)  Note by: Wallie Sherry, MD (TTS and AI technology used. I apologize for any typographical errors that were not detected and corrected.) Date: 04/11/2024; Time: 10:31 AM

## 2024-04-11 NOTE — Progress Notes (Signed)
 Safety precautions to be maintained throughout the outpatient stay will include: orient to surroundings, keep bed in low position, maintain call bell within reach at all times, provide assistance with transfer out of bed and ambulation.

## 2024-04-11 NOTE — Patient Instructions (Signed)
 Soap scrub Medtronic brochure

## 2024-04-20 NOTE — Progress Notes (Signed)
 Remote PPM Transmission

## 2024-04-24 ENCOUNTER — Encounter (HOSPITAL_BASED_OUTPATIENT_CLINIC_OR_DEPARTMENT_OTHER): Admitting: General Surgery

## 2024-04-24 DIAGNOSIS — I872 Venous insufficiency (chronic) (peripheral): Secondary | ICD-10-CM | POA: Diagnosis not present

## 2024-04-24 DIAGNOSIS — L97822 Non-pressure chronic ulcer of other part of left lower leg with fat layer exposed: Secondary | ICD-10-CM | POA: Diagnosis not present

## 2024-04-27 ENCOUNTER — Telehealth: Payer: Self-pay | Admitting: *Deleted

## 2024-04-27 NOTE — Telephone Encounter (Signed)
 Patient instructed regarding SCS trial on 05/17/24 at 0930. Have a driver, Do not eat anything past 2am. Take BP medication with a sip of water . Wash back night before and morning of with hibiclens . Verbalizes understanding.

## 2024-05-08 ENCOUNTER — Encounter (HOSPITAL_BASED_OUTPATIENT_CLINIC_OR_DEPARTMENT_OTHER): Attending: General Surgery | Admitting: General Surgery

## 2024-05-08 DIAGNOSIS — G629 Polyneuropathy, unspecified: Secondary | ICD-10-CM | POA: Diagnosis not present

## 2024-05-08 DIAGNOSIS — E669 Obesity, unspecified: Secondary | ICD-10-CM | POA: Insufficient documentation

## 2024-05-08 DIAGNOSIS — R600 Localized edema: Secondary | ICD-10-CM | POA: Diagnosis not present

## 2024-05-08 DIAGNOSIS — L97822 Non-pressure chronic ulcer of other part of left lower leg with fat layer exposed: Secondary | ICD-10-CM | POA: Diagnosis not present

## 2024-05-08 DIAGNOSIS — I872 Venous insufficiency (chronic) (peripheral): Secondary | ICD-10-CM | POA: Diagnosis not present

## 2024-05-11 ENCOUNTER — Encounter: Payer: Self-pay | Admitting: Family Medicine

## 2024-05-12 MED ORDER — TIRZEPATIDE-WEIGHT MANAGEMENT 5 MG/0.5ML ~~LOC~~ SOLN: 5.0000 mg | SUBCUTANEOUS | 1 refills | Status: DC

## 2024-05-13 ENCOUNTER — Other Ambulatory Visit: Payer: Self-pay | Admitting: Family Medicine

## 2024-05-16 ENCOUNTER — Encounter: Payer: Self-pay | Admitting: Family Medicine

## 2024-05-16 ENCOUNTER — Other Ambulatory Visit (HOSPITAL_COMMUNITY): Payer: Self-pay

## 2024-05-16 ENCOUNTER — Telehealth: Payer: Self-pay

## 2024-05-16 NOTE — Telephone Encounter (Signed)
 Pharmacy Patient Advocate Encounter   Received notification from RX Request Messages that prior authorization for Zepbound  2.5 is required/requested.   Insurance verification completed.   The patient is insured through [facility] ADVANTAGE/RX ADVANCE.   Per test claim: PA required; PA submitted to above mentioned insurance via Latent Key/confirmation #/EOC [id number] Status is pending

## 2024-05-17 ENCOUNTER — Other Ambulatory Visit (HOSPITAL_COMMUNITY): Payer: Self-pay

## 2024-05-17 ENCOUNTER — Ambulatory Visit (HOSPITAL_BASED_OUTPATIENT_CLINIC_OR_DEPARTMENT_OTHER): Admitting: Student in an Organized Health Care Education/Training Program

## 2024-05-17 ENCOUNTER — Ambulatory Visit
Admission: RE | Admit: 2024-05-17 | Discharge: 2024-05-17 | Disposition: A | Discharge status: Home / Self Care | Source: Ambulatory Visit | Attending: Student in an Organized Health Care Education/Training Program | Admitting: Student in an Organized Health Care Education/Training Program

## 2024-05-17 ENCOUNTER — Encounter: Payer: Self-pay | Admitting: Student in an Organized Health Care Education/Training Program

## 2024-05-17 ENCOUNTER — Other Ambulatory Visit: Payer: Self-pay | Admitting: Student in an Organized Health Care Education/Training Program

## 2024-05-17 DIAGNOSIS — M5416 Radiculopathy, lumbar region: Secondary | ICD-10-CM

## 2024-05-17 DIAGNOSIS — G8929 Other chronic pain: Secondary | ICD-10-CM | POA: Insufficient documentation

## 2024-05-17 DIAGNOSIS — G894 Chronic pain syndrome: Secondary | ICD-10-CM | POA: Diagnosis not present

## 2024-05-17 DIAGNOSIS — M961 Postlaminectomy syndrome, not elsewhere classified: Secondary | ICD-10-CM

## 2024-05-17 MED ORDER — CEFAZOLIN SODIUM 1 G IJ SOLR
INTRAMUSCULAR | Status: AC
  Filled 2024-05-17: qty 20

## 2024-05-17 MED ORDER — LIDOCAINE HCL 2 % IJ SOLN
20.0000 mL | Freq: Once | INTRAMUSCULAR | Status: AC
  Administered 2024-05-17: 400 mg

## 2024-05-17 MED ORDER — LACTATED RINGERS IV SOLN: Freq: Once | INTRAVENOUS | Status: AC

## 2024-05-17 MED ORDER — ROPIVACAINE HCL 2 MG/ML IJ SOLN
9.0000 mL | Freq: Once | INTRAMUSCULAR | Status: AC
  Administered 2024-05-17: 9 mL via PERINEURAL

## 2024-05-17 MED ORDER — ROPIVACAINE HCL 2 MG/ML IJ SOLN
INTRAMUSCULAR | Status: AC
  Filled 2024-05-17: qty 20

## 2024-05-17 MED ORDER — LIDOCAINE HCL 2 % IJ SOLN
INTRAMUSCULAR | Status: AC
  Filled 2024-05-17: qty 20

## 2024-05-17 MED ORDER — CEFAZOLIN SODIUM-DEXTROSE 2-4 GM/100ML-% IV SOLN
2.0000 g | INTRAVENOUS | Status: AC
  Administered 2024-05-17: 2 g via INTRAVENOUS

## 2024-05-17 MED ORDER — MIDAZOLAM HCL 5 MG/5ML IJ SOLN
0.5000 mg | Freq: Once | INTRAMUSCULAR | Status: AC
  Administered 2024-05-17: 2 mg via INTRAVENOUS

## 2024-05-17 MED ORDER — FENTANYL CITRATE (PF) 100 MCG/2ML IJ SOLN
INTRAMUSCULAR | Status: AC
  Filled 2024-05-17: qty 2

## 2024-05-17 MED ORDER — PREDNISONE 20 MG PO TABS
ORAL_TABLET | ORAL | 0 refills | Status: DC
Start: 2024-05-17 — End: 2024-06-13

## 2024-05-17 MED ORDER — FENTANYL CITRATE (PF) 100 MCG/2ML IJ SOLN
25.0000 ug | INTRAMUSCULAR | Status: DC | PRN
  Administered 2024-05-17: 50 ug via INTRAVENOUS

## 2024-05-17 MED ORDER — CEPHALEXIN 500 MG PO CAPS: 500.0000 mg | ORAL_CAPSULE | Freq: Four times a day (QID) | ORAL | 0 refills | Status: AC

## 2024-05-17 MED ORDER — MIDAZOLAM HCL 5 MG/5ML IJ SOLN
INTRAMUSCULAR | Status: AC
  Filled 2024-05-17: qty 5

## 2024-05-17 NOTE — Patient Instructions (Addendum)
 -We have done a Spinal Cord Stimulator Trial with MEDTRONIC  -As long as the leads are in place, do not bathe or shower. You may sponge bathe.  -While the lead is in place, please limit the bending, lifting, or twisting because the lead can move.  -The things we want to see is if your pain improves (and by what percentage), if you can do more activity (don't overdo it), and if you can use less of your as needed medicine. Do not stop long acting medicines like methadone, oxycontin , MS Contin , etc without checking with us .  -It is VERY important that you pick up the antibiotics we prescribed, Keflex , on your way home from the trial and take them as prescribed(4 times a day), starting today, for as long as the lead is in place.  -The Spina Cord Stimulator Representative will be in contact with you while the lead is in place to make sure the trial goes as well as possible.  -Please contact us  with any questions or concerns at any time during the trial.   -If you start running a fever over 100 degrees, have severe back pain, or new pain running down the legs, or drainage coming from the lead site, contact us  immediately and/or go to the emergency room.  -Please do not restart any sort of medication that can thin your blood such as Aspirin , ibuprofen, motrin, aleve, plavix, coumadin, etc. If you aren't sure, call and ask.  -We will have you return on Wed to have the lead removed. If this is successful, at that point we can go over the details about the permanent implant.    Moderate Conscious Sedation, Adult, Care After After the procedure, it is common to have: Sleepiness for a few hours. Impaired judgment for a few hours. Trouble with balance. Nausea or vomiting if you eat too soon. Follow these instructions at home: For the time period you were told by your health care provider:  Rest. Do not participate in activities where you could fall or become injured. Do not drive or use  machinery. Do not drink alcohol. Do not take sleeping pills or medicines that cause drowsiness. Do not make important decisions or sign legal documents. Do not take care of children on your own. Eating and drinking Follow instructions from your health care provider about what you may eat and drink. Drink enough fluid to keep your urine pale yellow. If you vomit: Drink clear fluids slowly and in small amounts as you are able. Clear fluids include water , ice chips, low-calorie sports drinks, and fruit juice that has water  added to it (diluted fruit juice). Eat light and bland foods in small amounts as you are able. These foods include bananas, applesauce, rice, lean meats, toast, and crackers. General instructions Take over-the-counter and prescription medicines only as told by your health care provider. Have a responsible adult stay with you for the time you are told. Do not use any products that contain nicotine or tobacco. These products include cigarettes, chewing tobacco, and vaping devices, such as e-cigarettes. If you need help quitting, ask your health care provider. Return to your normal activities as told by your health care provider. Ask your health care provider what activities are safe for you. Your health care provider may give you more instructions. Make sure you know what you can and cannot do. Contact a health care provider if: You are still sleepy or having trouble with balance after 24 hours. You feel light-headed. You vomit every  time you eat or drink. You get a rash. You have a fever. You have redness or swelling around the IV site. Get help right away if: You have trouble breathing. You start to feel confused at home. These symptoms may be an emergency. Get help right away. Call 911. Do not wait to see if the symptoms will go away. Do not drive yourself to the hospital. This information is not intended to replace advice given to you by your health care provider. Make  sure you discuss any questions you have with your health care provider. Document Revised: 01/26/2022 Document Reviewed: 01/26/2022 Elsevier Patient Education  2024 [employer].Today we did the following

## 2024-05-17 NOTE — Progress Notes (Signed)
 PROVIDER NOTE: Interpretation of information contained herein should be left to medically-trained personnel. Specific patient instructions are provided elsewhere under Patient Instructions section of medical record. This document was created in part using STT-dictation technology, any transcriptional errors that may result from this process are unintentional.  Patient: Randy Kennedy Type: Established DOB: 08-23-1950 MRN: [id number] PCP: Rilla Baller, MD  Service: Procedure DOS: 05/17/2024 Setting: Ambulatory Location: Ambulatory outpatient facility Delivery: Face-to-face Provider: Wallie Sherry, MD Specialty: Interventional Pain Management Specialty designation: 09 Location: Outpatient facility Ref. Prov.: Sherry Wallie, MD       Interventional Therapy   Primary Reason for Admission: Surgical management of chronic pain condition.   Procedure:              Type: Medtronic Trial Spinal Cord Neurostimulator Implant (Percutaneous, interlaminar, posterior epidural placement) Laterality: Bilateral (-50)  Level: Lumbar  Imaging: Fluoroscopic guidance Anesthesia: Local anesthesia (1-2% Lidocaine ) Sedation: Moderate Sedation                       DOS: 05/17/2024  Performed by: Wallie Sherry, MD  Purpose: Diagnostic. To determine if a permanent implant may be effective in controlling some or all of Randy Kennedy chronic pain symptoms.  Rationale (medical necessity): procedure needed and proper for the diagnosis and/or treatment of Randy Kennedy medical symptoms and needs. 1. Failed back surgical syndrome   2. Lumbar post-laminectomy syndrome   3. Chronic radicular lumbar pain   4. Chronic pain syndrome    NAS-11 Pain score:   Pre-procedure: 6 /10   Post-procedure: 0-No pain/10     Target: Posterior epidural space over the dorsal columns of the spinal cord. Location: Posterior intraspinal canal Region: Thoracolumbar  Approach: Translaminar percutaneous  Type of procedure:  Surgical   Position / Prep / Materials:  Position: Prone  Prep solution: ChloraPrep (2% chlorhexidine  gluconate and 70% isopropyl alcohol) Prep Area: Entire Posterior Thoracolumbar Region  Materials:  Tray: Implant tray        Needle(s):  Type: Epidural  Gauge (G): 14  Length: Regular (10cm)  Qty: 2  H&P (Pre-op Assessment):  Randy Kennedy is a 73 y.o. (year old), male patient, seen today for interventional treatment. 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  Initial Vital Signs:  Pulse/EKG Rate: 60ECG Heart Rate: 60 (nsr) Temp: (!) 97.3 F (36.3 C) Resp: 17 BP: 116/75 SpO2: 97 %  BMI: Estimated body mass index is 31.95 kg/m as calculated from the following:   Height as of this encounter: 5' 7 (1.702 m).   Weight as of this encounter: 204 lb (92.5 kg).  Risk Assessment: Allergies: Reviewed. He is allergic to amlodipine , allopurinol, lisinopril -hydrochlorothiazide , procardia  [nifedipine ], clonidine  derivatives, doxazosin  mesylate, latex, nortriptyline , and uloric  [febuxostat ].  Allergy Precautions: None required Coagulopathies: Reviewed. None identified.  Blood-thinner therapy: None at this time Active Infection(s): Reviewed. None identified. Randy Kennedy is afebrile  Site Confirmation: Randy Kennedy was asked to confirm the procedure and  laterality before marking the site, which he did. Procedure checklist: Completed  Consent: Before the procedure and under the influence of no sedative(s), amnesic(s), or anxiolytics, the patient was informed of the treatment options, risks and possible complications. To fulfill our ethical and legal obligations, as recommended by the American Medical Association's Code of Ethics, I have informed the patient of my clinical impression; the nature and purpose of the treatment or procedure; the risks, benefits, and possible complications of the intervention; the alternatives, including doing nothing; the risk(s) and benefit(s) of the alternative treatment(s) or procedure(s); and the risk(s) and benefit(s) of doing nothing.  Randy Kennedy was provided with information about the general risks and possible complications associated with most interventional procedures. These include, but are not limited to: failure to achieve desired goals, infection, bleeding, organ or nerve damage, allergic reactions, paralysis, and/or death.  In addition, he was informed of those risks and possible complications associated to this particular procedure, which include, but are not limited to: damage to the implant; failure to decrease pain; local, systemic, or serious CNS infections, intraspinal abscess with possible cord compression and paralysis, or life-threatening such as meningitis; intrathecal and/or epidural bleeding with formation of hematoma with possible spinal cord compression and permanent paralysis; organ damage; nerve injury or damage with subsequent sensory, motor, and/or autonomic system dysfunction, resulting in transient or permanent pain, numbness, and/or weakness of one or several areas of the body; allergic reactions, either minor or major life-threatening, such as anaphylactic or anaphylactoid reactions.  Furthermore, Randy Kennedy was informed of those risks and complications associated with the medications.  These include, but are not limited to: allergic reactions (i.e.: anaphylactic or anaphylactoid reactions); arrhythmia;  Hypotension/hypertension; cardiovascular collapse; respiratory depression and/or shortness of breath; swelling or edema; medication-induced neural toxicity; particulate matter embolism and blood vessel occlusion with resultant organ, and/or nervous system infarction and permanent paralysis.  Finally, he was informed that Medicine is not an exact science; therefore, there is also the possibility of unforeseen or unpredictable risks and/or possible complications that may result in a catastrophic outcome. The patient indicated having understood very clearly. We have given the patient no guarantees and we have made no promises. Enough time was given to the patient to ask questions, all of which were answered to the patient's satisfaction. Mr. Kun has indicated that he wanted to continue with the procedure. Attestation: I, the ordering provider, attest that I have discussed with the patient the benefits, risks, side-effects, alternatives, likelihood of achieving goals, and potential problems during recovery for the procedure that I have provided informed consent. Date  Time: 05/17/2024  9:25 AM  Pre-Procedure Preparation:  Monitoring: As per clinic protocol. Respiration, ETCO2, SpO2, BP, heart rate and rhythm monitor placed and checked for adequate function Safety Precautions: Patient was assessed for positional comfort and pressure points before starting the procedure. Time-out: I initiated and conducted the Time-out before starting the procedure, as per protocol. The patient was asked to participate by confirming the accuracy of the Time Out information. Verification of the correct person, site, and procedure were performed and confirmed by me, the nursing staff, and the patient. Time-out conducted as per Joint Commission's Universal Protocol (UP.01.01.01). Time: 1058 Start  Time: 1058 hrs.  Description/Narrative of Procedure:          Rationale (medical necessity): procedure needed and proper for the diagnosis and/or treatment of the patient's medical symptoms and needs. Procedural Technique Safety Precautions: Aspiration looking for blood return was conducted prior to all injections. At no point did we inject any substances, as a needle was  being advanced. No attempts were made at seeking any paresthesias. Safe injection practices and needle disposal techniques used. Medications properly checked for expiration dates. SDV (single dose vial) medications used. Description of the Procedure: Protocol guidelines were followed. The patient was assisted into a comfortable position. The target area was identified and the area prepped in the usual manner. Skin & deeper tissues infiltrated with local anesthetic. Appropriate amount of time allowed to pass for local anesthetics to take effect. The procedure needles were then advanced to the target area. Proper needle placement secured. Negative aspiration confirmed. Solution injected in intermittent fashion, asking for systemic symptoms every 0.5cc of injectate. The needles were then removed and the area cleansed, making sure to leave some of the prepping solution back to take advantage of its long term bactericidal properties.  Technical description of procedure: Availability of a responsible, adult driver, and NPO status confirmed. Informed consent was obtained after having discussed risks and possible complications. An IV was started. The patient was then taken to the fluoroscopy suite, where the patient was placed in position for the procedure, over the fluoroscopy table. The patient was then monitored in the usual manner. Fluoroscopy was manipulated to obtain the best possible view of the target. Parallex error was corrected before commencing the procedure. Once a clear view of the target had been obtained, the skin and deeper tissues  over the procedure site were infiltrated using lidocaine , loaded in a 10 cc luer-loc syringe with a 0.5 inch, 25-G needle. The introducer needle(s) was/were then inserted through the skin and deeper tissues. A paramidline approach was used to enter the posterior epidural space at a 30 angle, using "Loss-of-resistance Technique" with 3 ml of PF-NaCl (0.9% NSS). Correct needle placement was confirmed in the antero-posterior and lateral fluoroscopic views. The lead was gently introduced and manipulated under real-time fluoroscopy, constantly assessing for pain, discomfort, or paresthesias, until the tip rested at the desired level. Both sides were done in identical fashion. Electrode placement was tested until appropriate coverage was attained. Once the patient confirmed that the stimulation was over the desired area, the lead(s) was/were secured in place and the introducer needles removed. This was done under real-time fluoroscopy while observing the electrode tip to avoid unintended migration. The area was covered with a non-occlusive dressing and the patient transported to recovery for further programming.  Vitals:   05/17/24 1122 05/17/24 1136 05/17/24 1144 05/17/24 1155  BP: 121/87 112/67 114/71 111/74  Pulse:      Resp: 18  18 16   Temp:   (!) 97.3 F (36.3 C)   TempSrc:      SpO2: 96% 98% 94% 94%  Weight:      Height:        Start Time: 1058 hrs. End Time: 1122 hrs.  Neurostimulator Details:  Lead(s):  Brand: Medtronic         Epidural Access Level:  T12-L1 T12-L1  Lead implant:  Bilateral   No. of Electrodes/Lead:  8 8  Laterality:  Left Right  Location (Top electrode):  T8 T8  Model No.: [id number] Same  Length: 60cm Same  Lot No.: [id number]   Imaging Guidance (Spinal):         Type of Imaging Technique: Fluoroscopy Guidance (Spinal) Indication(s): Fluoroscopy guidance for needle placement to enhance accuracy in procedures requiring precise needle localization for  targeted delivery of medication in or near specific anatomical locations not easily accessible without such real-time imaging assistance. Exposure Time: Please see nurses notes. Contrast:  None used. Fluoroscopic Guidance: I was personally present during the use of fluoroscopy. Tunnel Vision Technique used to obtain the best possible view of the target area. Parallax error corrected before commencing the procedure. Direction-depth-direction technique used to introduce the needle under continuous pulsed fluoroscopy. Once target was reached, antero-posterior, oblique, and lateral fluoroscopic projection used confirm needle placement in all planes. Images permanently stored in EMR. Interpretation: No contrast injected. I personally interpreted the imaging intraoperatively. Adequate needle placement confirmed in multiple planes. Permanent images saved into the patient's record.  Antibiotic Prophylaxis:   Anti-infectives (From admission, onward)    Start     Dose/Rate Route Frequency Ordered Stop   05/17/24 1119  ceFAZolin  (ANCEF ) IVPB 2g/100 mL premix        2 g 200 mL/hr over 30 Minutes Intravenous 30 min pre-op 05/17/24 1119 05/17/24 1040   05/17/24 0000  cephALEXin  (KEFLEX ) 500 MG capsule        500 mg Oral 4 times daily 05/17/24 1045 05/24/24 2359      Indication(s): Implant Prophylaxis.  Post-operative Assessment:  Post-procedure Vital Signs:  Pulse/HCG Rate: 6062 Temp: (!) 97.3 F (36.3 C) Resp: 16 BP: 111/74 SpO2: 94 %  Complications: No immediate post-treatment complications observed by team, or reported by patient.  Note: The patient tolerated the entire procedure well. A repeat set of vitals were taken after the procedure and the patient was kept under observation following institutional policy, for this type of procedure. Post-procedural neurological assessment was performed, showing return to baseline, prior to discharge. The patient was provided with post-procedure  discharge instructions, including a section on how to identify potential problems. Should any problems arise concerning this procedure, the patient was given instructions to immediately contact us , at any time, without hesitation. In any case, we plan to contact the patient by telephone for a follow-up status report regarding this interventional procedure.  Comments:  No additional relevant information.  Plan of Care Orders:  No orders of the defined types were placed in this encounter.    Medications administered: We administered lidocaine , midazolam , fentaNYL , lactated ringers , ropivacaine (PF) 2 mg/mL (0.2%), and ceFAZolin .  See the medical record for exact dosing, route, and time of administration.    Follow-up plan:   Return in about 1 week (around 05/24/2024) for SCS lead pull.     Recent Visits Date Type Provider Dept  04/11/24 Office Visit Marcelino Nurse, MD [facility]  Showing recent visits within past 90 days and meeting all other requirements Today's Visits Date Type Provider Dept  05/17/24 Procedure visit Marcelino Nurse, MD [facility]  Showing today's visits and meeting all other requirements Future Appointments Date Type Provider Dept  05/24/24 Appointment Marcelino Nurse, MD [facility]  Showing future appointments within next 90 days and meeting all other requirements  Disposition: Discharge home  Discharge (Date  Time): 05/17/2024; 1156 hrs.   Primary Care Physician: Rilla Baller, MD Location: [facility] Note by: Nurse Marcelino, MD (TTS technology used. I apologize for any typographical errors that were not detected and corrected.) Date: 05/17/2024; Time: 12:24 PM

## 2024-05-17 NOTE — Telephone Encounter (Signed)
 Pharmacy Patient Advocate Encounter  Received notification from [employer] that Prior Authorization for Zepbound  2.5 has been DENIED.  Full denial letter will be uploaded to the media tab. See denial reason below.    PA #/Case ID/Reference #: # [id number]

## 2024-05-17 NOTE — Progress Notes (Signed)
 Safety precautions to be maintained throughout the outpatient stay will include: orient to surroundings, keep bed in low position, maintain call bell within reach at all times, provide assistance with transfer out of bed and ambulation.

## 2024-05-18 ENCOUNTER — Telehealth: Payer: Self-pay

## 2024-05-18 NOTE — Telephone Encounter (Signed)
 Post procedure follow up.  LM

## 2024-05-22 NOTE — Telephone Encounter (Signed)
 Please notify patient - insurance has denied refill of zepbound  saying he needs to try alternative on formulary. Please have him call his insurance to see what medications is listed as an alternative to zepbound  for weight loss, and see if zepbound  would be approved for sleep apnea indication.

## 2024-05-24 ENCOUNTER — Ambulatory Visit
Admission: RE | Admit: 2024-05-24 | Discharge: 2024-05-24 | Disposition: A | Discharge status: Home / Self Care | Source: Ambulatory Visit | Attending: Student in an Organized Health Care Education/Training Program | Admitting: Student in an Organized Health Care Education/Training Program

## 2024-05-24 ENCOUNTER — Other Ambulatory Visit: Payer: Self-pay | Admitting: Student in an Organized Health Care Education/Training Program

## 2024-05-24 ENCOUNTER — Ambulatory Visit (HOSPITAL_BASED_OUTPATIENT_CLINIC_OR_DEPARTMENT_OTHER): Admitting: Student in an Organized Health Care Education/Training Program

## 2024-05-24 ENCOUNTER — Encounter: Payer: Self-pay | Admitting: Student in an Organized Health Care Education/Training Program

## 2024-05-24 VITALS — BP 118/73 | HR 60 | Temp 97.9°F | Resp 16 | Ht 67.0 in | Wt 200.0 lb

## 2024-05-24 DIAGNOSIS — G894 Chronic pain syndrome: Secondary | ICD-10-CM

## 2024-05-24 DIAGNOSIS — G8929 Other chronic pain: Secondary | ICD-10-CM

## 2024-05-24 DIAGNOSIS — M961 Postlaminectomy syndrome, not elsewhere classified: Secondary | ICD-10-CM

## 2024-05-24 NOTE — Progress Notes (Signed)
 Safety precautions to be maintained throughout the outpatient stay will include: orient to surroundings, keep bed in low position, maintain call bell within reach at all times, provide assistance with transfer out of bed and ambulation.

## 2024-05-24 NOTE — Progress Notes (Signed)
 1008 SCS lead removal per Dr. Marcelino.. Site clear. Leads intact. Wound care instructions given.

## 2024-05-24 NOTE — Progress Notes (Signed)
 PROVIDER NOTE: Interpretation of information contained herein should be left to medically-trained personnel. Specific patient instructions are provided elsewhere under Patient Instructions section of medical record. This document was created in part using AI and STT-dictation technology, any transcriptional errors that may result from this process are unintentional.  Patient: Randy Kennedy  Service: E/M Post-op Encounter  PCP: Rilla Baller, MD  DOB: [birth year]  DOS: 05/24/2024  Provider: Wallie Sherry, MD  MRN: [id number]  Delivery: Face-to-face  Specialty: Interventional Pain Management  Type: Established Patient  Setting: Ambulatory outpatient facility  Specialty designation: 09  Referring Prov.: Rilla Baller, MD  Location: Outpatient office facility  SCS TRIAL POST-OP EVALUATION     Primary Reason(s) for Visit: Encounter for removal of temporary spinal cord stimulator lead(s) and evaluation of trial implant. CC: Back Pain  HPI  [name] is a 73 y.o. year old, male patient, who comes today for a post-procedure evaluation. He has GERD (gastroesophageal reflux disease); HTN (hypertension); Dyslipidemia; OSA (obstructive sleep apnea); RLS (restless legs syndrome); Gilbert disease; Systolic murmur; Gout; Erectile dysfunction; Leg edema; Drooping eyelid; History of ankle fracture; RSD lower limb; Thrombocytopenia; Medicare annual wellness visit, subsequent; Encounter for general adult medical examination with abnormal findings; Advanced care planning/counseling discussion; Bunion; Cough; Narcolepsy; Chronic fatigue; Encounter for chronic pain management; Obesity, Class I, BMI 30-34.9; Perennial allergic rhinitis; Shortness of breath; Pain and swelling of elbow, left; Myalgia; Burning sensation of skin; Chronic pain syndrome; Hearing loss; Open wound of right external ear; Left testicular pain; Family history of colon cancer in mother; Chronic radicular lumbar pain; Sinus bradycardia;  Status cardiac pacemaker; S/P spinal fusion; Spondylolisthesis of lumbar region; Chronic bilateral low back pain with bilateral sciatica; Chronic pain of left groin; Hematospermia; Callus of foot; Bilateral carpal tunnel syndrome; Right carpal tunnel syndrome; Leg wound, left, initial encounter; Prediabetes; Morbid obesity (HCC); and Lumbar post-laminectomy syndrome on their problem list. His primarily concern today is the Back Pain  Pain Assessment: Location: Lower Back Radiating: ltd radiaiting pain to left groin - nothing like it was Onset: More than a month ago Duration: Chronic pain Quality: Aching, Dull Severity: 6 /10 (subjective, self-reported pain score)  Effect on ADL: limits adls Timing: Constant Modifying factors: scs trial, sitting with pillow behind back BP: 118/73  HR: 60  [name] comes in today, after a SCS (Spinal Cord Stimulator) Trial Implant on 05/18/2024, to have his percutaneous, temporary neurostimulator lead(s) removed and to evaluate the trial experience to determine if a permanent implant may be effective in controlling some or all of his chronic pain symptoms.  Further details on both, my assessment(s), as well as the proposed treatment plan, please see below.  Patient endorses improvement in low back pain and left groin pain during SCS trial.  He endorses 50% pain relief and improvement in functional status during duration of SCS trial.  He would like to move forward with permanent Medtronic SCS implant  Post-operative Assessment Intra-procedural problems/complications: None observed.         Reported side-effects: None.        Post-surgical adverse reactions or complications: None reported         Laboratory Chemistry Profile   Renal Lab Results  Component Value Date   BUN 22 01/31/2024   CREATININE 1.12 01/31/2024   BCR 15 08/03/2022   GFR 65.33 01/31/2024   GFRAA >60 10/04/2017   GFRNONAA >60 01/14/2024   SPECGRAV 1.010 10/25/2023   PHUR 6.0  10/25/2023   PROTEINUR Negative 10/25/2023  Electrolytes Lab Results  Component Value Date   NA 139 01/31/2024   K 4.4 01/31/2024   CL 103 01/31/2024   CALCIUM 9.1 01/31/2024     Hepatic Lab Results  Component Value Date   AST 21 01/31/2024   ALT 18 01/31/2024   ALBUMIN 4.3 01/31/2024   ALKPHOS 41 01/31/2024     ID Lab Results  Component Value Date   STAPHAUREUS NEGATIVE 11/18/2022   MRSAPCR NEGATIVE 11/18/2022   HCVAB NEGATIVE 01/04/2017     Bone No results found for: VD25OH, CI874NY7UNU, CI6874NY7, CI7874NY7, 25OHVITD1, 25OHVITD2, 25OHVITD3, TESTOFREE, TESTOSTERONE   Endocrine Lab Results  Component Value Date   GLUCOSE 116 (H) 01/31/2024   GLUCOSEU NEGATIVE 11/18/2022   HGBA1C 6.0 (A) 02/22/2024   TSH 2.690 05/01/2022     Neuropathy Lab Results  Component Value Date   VITAMINB12 782 01/31/2024   FOLATE 19.8 09/13/2012   HGBA1C 6.0 (A) 02/22/2024     CNS No results found for: COLORCSF, APPEARCSF, RBCCOUNTCSF, WBCCSF, POLYSCSF, LYMPHSCSF, EOSCSF, PROTEINCSF, GLUCCSF, JCVIRUS, CSFOLI, IGGCSF, LABACHR, ACETBL   Inflammation (CRP: Acute  ESR: Chronic) Lab Results  Component Value Date   ESRSEDRATE 16 08/27/2020     Rheumatology Lab Results  Component Value Date   LABURIC 7.1 01/31/2024     Coagulation Lab Results  Component Value Date   INR 1.0 11/16/2022   LABPROT 10.8 11/16/2022   PLT 192.0 Repeated and verified X2. 01/31/2024     Cardiovascular Lab Results  Component Value Date   CKTOTAL 46 08/26/2020   TROPONINI <0.03 08/24/2017   HGB 12.7 (L) 01/31/2024   HCT 36.4 (L) 01/31/2024     Screening Lab Results  Component Value Date   STAPHAUREUS NEGATIVE 11/18/2022   MRSAPCR NEGATIVE 11/18/2022   HCVAB NEGATIVE 01/04/2017     Cancer No results found for: CEA, CA125, LABCA2   Allergens No results found for: ALMOND, APPLE, ASPARAGUS, AVOCADO, BANANA, BARLEY, BASIL,  BAYLEAF, GREENBEAN, LIMABEAN, WHITEBEAN, BEEFIGE, REDBEET, BLUEBERRY, BROCCOLI, CABBAGE, MELON, CARROT, CASEIN, CASHEWNUT, CAULIFLOWER, CELERY     Note: Lab results reviewed.  Recent Imaging Results  Results for orders placed during the hospital encounter of 11/27/22  DG C-Arm 1-60 Min-No Report  Narrative Fluoroscopy was utilized by the requesting physician.  No radiographic interpretation.  Interpretation Report: Fluoroscopy was used during the procedure to assist with needle guidance. The images were interpreted intraoperatively by the requesting physician.        Meds   Current Outpatient Medications:    aspirin  EC 81 MG tablet, Take 81 mg by mouth daily., Disp: , Rfl:    carvedilol  (COREG ) 25 MG tablet, Take 1 tablet (25 mg total) by mouth 2 (two) times daily with a meal., Disp: 180 tablet, Rfl: 3   cephALEXin  (KEFLEX ) 500 MG capsule, Take 1 capsule (500 mg total) by mouth 4 (four) times daily for 7 days., Disp: 28 capsule, Rfl: 0   colchicine  0.6 MG tablet, Take 1 tablet (0.6 mg total) by mouth daily as needed (gout flare)., Disp: 30 tablet, Rfl: 3   fexofenadine (ALLEGRA) 180 MG tablet, Take 180 mg by mouth every morning., Disp: , Rfl:    fluticasone  (FLONASE ) 50 MCG/ACT nasal spray, SPRAY 2 SPRAYS INTO EACH NOSTRIL EVERY DAY, Disp: 48 mL, Rfl: 3   gabapentin  (NEURONTIN ) 300 MG capsule, Take 1 capsule (300 mg total) by mouth 2 (two) times daily AND 2-3 capsules (600-900 mg total) at bedtime., Disp: 480 capsule, Rfl: 3   hydrALAZINE  (APRESOLINE ) 100 MG  tablet, Take 1 tablet (100 mg total) by mouth 3 (three) times daily., Disp: 270 tablet, Rfl: 3   minoxidil  (LONITEN ) 2.5 MG tablet, TAKE 1 TABLET BY MOUTH DAILY AT BEDTIME, Disp: 90 tablet, Rfl: 2   montelukast  (SINGULAIR ) 10 MG tablet, Take 1 tablet (10 mg total) by mouth at bedtime., Disp: 90 tablet, Rfl: 3   nitroGLYCERIN  (NITROSTAT ) 0.4 MG SL tablet, Place 1 tablet (0.4 mg total) under the tongue  every 5 (five) minutes as needed for chest pain., Disp: 25 tablet, Rfl: 4   pravastatin  (PRAVACHOL ) 40 MG tablet, Take 1 tablet (40 mg total) by mouth daily., Disp: 90 tablet, Rfl: 3   predniSONE  (DELTASONE ) 20 MG tablet, Take two tablets daily for 3 days followed by one tablet daily for 4 days, Disp: 10 tablet, Rfl: 0   probenecid  (BENEMID ) 500 MG tablet, TAKE 1/2 TABLET TWICE A DAY BY MOUTH, Disp: 90 tablet, Rfl: 3   spironolactone  (ALDACTONE ) 25 MG tablet, Take 1 tablet (25 mg total) by mouth daily., Disp: 90 tablet, Rfl: 3   tirzepatide  5 MG/0.5ML injection vial, Inject 5 mg into the skin once a week., Disp: 2 mL, Rfl: 1   valsartan  (DIOVAN ) 320 MG tablet, Take 1 tablet (320 mg total) by mouth daily., Disp: 90 tablet, Rfl: 3   vitamin C (ASCORBIC ACID) 500 MG tablet, Take 500 mg by mouth daily., Disp: , Rfl:   ROS  Constitutional: Denies any fever or chills Gastrointestinal: No reported hemesis, hematochezia, vomiting, or acute GI distress Musculoskeletal: Denies any acute onset joint swelling, redness, loss of ROM, or weakness Neurological: No reported episodes of acute onset apraxia, aphasia, dysarthria, agnosia, amnesia, paralysis, loss of coordination, or loss of consciousness  Allergies  Mr. Bracewell is allergic to amlodipine , allopurinol, lisinopril -hydrochlorothiazide , procardia  [nifedipine ], clonidine  derivatives, doxazosin  mesylate, latex, nortriptyline , and uloric  [febuxostat ].  PFSH  Drug: Mr. Jelinski  reports no history of drug use. Alcohol:  reports no history of alcohol use. Tobacco:  reports that he quit smoking about 45 years ago. His smoking use included pipe and cigarettes. He started smoking about 49 years ago. He has a 4 pack-year smoking history. He has never used smokeless tobacco. 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, Heartburn, Hepatitis, History of bilateral cataract extraction (11/2020), History of chicken pox, History of hepatitis B, HLD (hyperlipidemia), HTN (hypertension), Leg edema, Lobar pneumonia (05/13/2022), Long-term use of aspirin  therapy, Morbid obesity (HCC), Motion sickness, Narcolepsy (2006), Obesity, OSA (obstructive sleep apnea) (11/2011 sleep study), Peripheral neuropathy, Pneumonia, Presence of cardiac pacemaker (02/27/2022), RBBB, RBBB (right bundle branch block), RLS (restless legs syndrome), RSD lower limb (12/14/2014), Sinus node dysfunction (HCC), Sleep apnea (2008), Spondylolisthesis of lumbar region, Systolic murmur, Thrombocytopenia, Wears dentures, and Wears hearing aid in both ears. Surgical: Mr. Dagostino  has a past surgical history that includes Leg Surgery (x5); Tympanic membrane repair (Left, 1994); Mandible surgery (1965); Colonoscopy (02/2011); Cataract extraction w/PHACO (Right, 11/26/2020); Cataract extraction w/PHACO (Left, 12/10/2020); Colonoscopy with propofol  (N/A, 12/17/2020); Mohs surgery (Right, 07/03/2021); PACEMAKER IMPLANT (N/A, 02/27/2022); Transforaminal lumbar interbody fusion w/ mis 1 level (N/A, 11/27/2022); Application of intraoperative CT scan (N/A,  11/27/2022); Carpal tunnel release (Left, 01/18/2024); Insert / replace / remove pacemaker (Sept 2023); Fracture surgery (1985); Carpal tunnel release (Right, 02/24/2024); and Operative ultrasound (Right, 02/24/2024). Family: family history includes Cancer in his mother and paternal uncle; Cancer (age of onset: 21) in his father; Colon cancer in his mother; Heart attack in his  maternal aunt; Hypertension in his father, mother, and sister.  Postop Exam  General appearance: Afebrile. Well nourished, well developed, and well hydrated. In no apparent acute distress. Vitals:   05/24/24 0921  BP: 118/73  Pulse: 60  Resp: 16  Temp: 97.9 F (36.6 C)  SpO2: 98%  Weight: 200 lb (90.7 kg)  Height: 5' 7 (1.702 m)   BMI Assessment: Estimated body mass index is 31.32 kg/m as calculated from the following:   Height as of this encounter: 5' 7 (1.702 m).   Weight as of this encounter: 200 lb (90.7 kg).  Surgical site: Wound is healing well. No redness, tenderness, discharge, abnormal odors, or any other evidence of infection or complications.  Assessment  Primary Diagnosis & Pertinent Problem List: The primary encounter diagnosis was Failed back surgical syndrome. Diagnoses of Lumbar post-laminectomy syndrome, Chronic radicular lumbar pain, and Chronic pain syndrome were also pertinent to this visit.  Diagnosis Status  1. Failed back surgical syndrome   2. Lumbar post-laminectomy syndrome   3. Chronic radicular lumbar pain   4. Chronic pain syndrome    Controlled Controlled Controlled   Plan of Care  Referral to Dr. Katrina for permanent Medtronic implant Orders:  Orders Placed This Encounter  Procedures   Ambulatory referral to Neurosurgery    Referral Priority:   Routine    Referral Type:   Surgical    Referral Reason:   Specialty Services Required    Referred to Provider:   Clois Fret, MD    Requested Specialty:   Neurosurgery    Number of Visits Requested:   1    Medications administered: Heriberto Stmartin. Overdorf had no medications administered during this visit.  See the medical record for exact dosing, route, and time of administration.   Follow-up plan:   No follow-ups on file.     Recent Visits Date Type Provider Dept  05/17/24 Procedure visit Marcelino Nurse, MD [facility]  04/11/24 Office Visit Marcelino Nurse, MD  [facility]  Showing recent visits within past 90 days and meeting all other requirements Today's Visits Date Type Provider Dept  05/24/24 Procedure visit Marcelino Nurse, MD [facility]  Showing today's visits and meeting all other requirements Future Appointments No visits were found meeting these conditions. Showing future appointments within next 90 days and meeting all other requirements  Disposition: Discharge home  Discharge (Date  Time): 05/24/2024; 1011 hrs.   Primary Care Physician: Rilla Baller, MD Location: [facility] Note by: Nurse Marcelino, MD (TTS technology used. I apologize for any typographical errors that were not detected and corrected.) Date: 05/24/2024; Time: 10:13 AM

## 2024-05-31 NOTE — Telephone Encounter (Signed)
LMOM informing Pt of PCP recommendations.  

## 2024-06-08 ENCOUNTER — Ambulatory Visit (INDEPENDENT_AMBULATORY_CARE_PROVIDER_SITE_OTHER): Payer: PPO

## 2024-06-08 DIAGNOSIS — I495 Sick sinus syndrome: Secondary | ICD-10-CM

## 2024-06-08 LAB — CUP PACEART REMOTE DEVICE CHECK
Battery Remaining Longevity: 96 mo
Battery Remaining Percentage: 81 %
Battery Voltage: 3.01 V
Brady Statistic AP VP Percent: 2.4 %
Brady Statistic AP VS Percent: 97 %
Brady Statistic AS VP Percent: 1 %
Brady Statistic AS VS Percent: 1 %
Brady Statistic RA Percent Paced: 99 %
Brady Statistic RV Percent Paced: 2.4 %
Date Time Interrogation Session: 20251113020034
Implantable Lead Connection Status: 753985
Implantable Lead Connection Status: 753985
Implantable Lead Implant Date: 20230804
Implantable Lead Implant Date: 20230804
Implantable Lead Location: 753859
Implantable Lead Location: 753860
Implantable Pulse Generator Implant Date: 20230804
Lead Channel Impedance Value: 450 Ohm
Lead Channel Impedance Value: 490 Ohm
Lead Channel Pacing Threshold Amplitude: 0.625 V
Lead Channel Pacing Threshold Amplitude: 0.75 V
Lead Channel Pacing Threshold Pulse Width: 0.5 ms
Lead Channel Pacing Threshold Pulse Width: 0.5 ms
Lead Channel Sensing Intrinsic Amplitude: 12 mV
Lead Channel Sensing Intrinsic Amplitude: 2.4 mV
Lead Channel Setting Pacing Amplitude: 1 V
Lead Channel Setting Pacing Amplitude: 1.625
Lead Channel Setting Pacing Pulse Width: 0.5 ms
Lead Channel Setting Sensing Sensitivity: 2 mV
Pulse Gen Model: 2272
Pulse Gen Serial Number: 8101269

## 2024-06-13 ENCOUNTER — Other Ambulatory Visit: Payer: Self-pay

## 2024-06-13 ENCOUNTER — Ambulatory Visit: Admitting: Neurosurgery

## 2024-06-13 ENCOUNTER — Telehealth: Payer: Self-pay | Admitting: *Deleted

## 2024-06-13 ENCOUNTER — Ambulatory Visit: Payer: Self-pay | Admitting: Cardiology

## 2024-06-13 VITALS — BP 136/82 | Ht 67.0 in | Wt 200.2 lb

## 2024-06-13 DIAGNOSIS — Z01818 Encounter for other preprocedural examination: Secondary | ICD-10-CM

## 2024-06-13 DIAGNOSIS — M961 Postlaminectomy syndrome, not elsewhere classified: Secondary | ICD-10-CM

## 2024-06-13 NOTE — Patient Instructions (Signed)
 Please see below for information in regards to your upcoming surgery:   Planned surgery: thoracic laminectomy for spinal cord stimulator placement (Medtronic)   Surgery date: 07/05/24 at [facility] (Medical Mall: [address], [place], [place] [address]) - you will find out your arrival time the business day before your surgery.   Pre-op appointment at [facility] Pre-admit Testing: you will receive a call with a date/time for this appointment. If you are scheduled for an in person appointment, Pre-admit Testing is located on the first floor of the [facility] building, [address], [address]. During this appointment, they will advise you which medications you can take the morning of surgery, and which medications you will need to hold for surgery. Labs (such as blood work, EKG) may be done at your pre-op appointment. You are not required to fast for these labs. Should you need to change your pre-op appointment, please call Pre-admit testing at [phone].     Blood thinners:   Aspirin  81mg :  OK to stay on aspirin  81mg      Diabetes/heart failure/kidney disease/weight loss medications that require an extended hold: Per anesthesia guidelines (due to the increased risk of aspiration caused by delayed gastric emptying):  Tirzepatide  injections: hold for 7 days prior to surgery    Surgical clearance: we will send a clearance form to Dr Rilla (PCP) and [facility]. They may wish to see you in their office prior to signing the clearance form. If so, they may call you to schedule an appointment.    Common restrictions after spine surgery: No bending, lifting, or twisting ("BLT"). Avoid lifting objects heavier than 10 pounds for the first 6 weeks after surgery. Where possible, avoid household activities that involve lifting, bending, reaching, pushing, or pulling such as laundry, vacuuming, grocery shopping, and childcare. Try to arrange  for help from friends and family for these activities while you heal. Do not drive while taking prescription pain medication. Weeks 6 through 12 after surgery: avoid lifting more than 25 pounds.    How to contact us :  If you have any questions/concerns before or after surgery, you can reach us  at [phone], or you can send a mychart message. We can be reached by phone or mychart 8am-4pm, Monday-Friday.  *Please note: Calls after 4pm are forwarded to a third party answering service. Mychart messages are not routinely monitored during evenings, weekends, and holidays. Please call our office to contact the answering service for urgent concerns during non-business hours.    If you have FMLA/disability paperwork, please drop it off or fax it to [phone]   Appointments/FMLA & disability paperwork: Reche Hait, & Nichole Registered Nurse/Surgery scheduler: Miyeko Mahlum, RN & Katie, RN Certified Medical Assistants: Don, CMA, Elenor, CMA, Damien, CMA, & Auston, [place] Physician Assistants: Lyle Decamp, PA-C, Edsel Goods, PA-C & Glade Boys, PA-C Surgeons: Penne Sharps, MD & Reeves Daisy, MD   [facility] PREADMIT TESTING VISIT and SURGERY INFORMATION SHEET   Now that surgery has been scheduled you can anticipate several phone calls from [facility] services. A pharmacy technician will call you to verify your current list of medications taken at home.               The Pre-Service Center will call to verify your insurance information and to give you billing estimates and information.             The Preadmit Testing Office will be calling to schedule a visit to  obtain information for the anesthesia team and provide instructions on preparation for surgery.  What can you expect for the Preadmit Testing Visit: Appointments may be scheduled in-person or by telephone.  If a telephone visit is scheduled, you may be asked to come into the office to have lab tests or  other studies performed.   This visit will not be completed any greater than 14 days prior to your surgery.  If your surgery has been scheduled for a future date, please do not be alarmed if we have not contacted you to schedule an appointment more than a month prior to the surgery date.    Please be prepared to provide the following information during this appointment:            -Personal medical history                                               -Medication and allergy list            -Any history of problems with anesthesia              -Recent lab work or diagnostic studies            -Please notify us  of any needs we should be aware of to provide the best care possible           -You will be provided with instructions on how to prepare for your surgery.    On The Day of Surgery:  You must have a driver to take you home after surgery, you will be asked not to drive for 24 hours following surgery.  Taxi, Gisele and non-medical transport will not be acceptable means of transportation unless you have a responsible individual who will be traveling with you.  Visitors in the surgical area:   2 people will be able to visit you in your room once your preparation for surgery has been completed. During surgery, your visitors will be asked to wait in the Surgery Waiting Area.  It is not a requirement for them to stay, if they prefer to leave and come back.  Your visitor(s) will be given an update once the surgery has been completed.  No visitors are allowed in the initial recovery room to respect patient privacy and safety.  Once you are more awake and transfer to the secondary recovery area, or are transferred to an inpatient room, visitors will again be able to see you.  To respect and protect your privacy: We will ask on the day of surgery who your driver will be and what the contact number for that individual will be. We will ask if it is okay to share information with this individual, or if  there is an alternative individual that we, or the surgeon, should contact to provide updates and information. If family or friends come to the surgical information desk requesting information about you, who you have not listed with us , no information will be given.   It may be helpful to designate someone as the main contact who will be responsible for updating your other friends and family.    PREADMIT TESTING OFFICE: [phone] SAME DAY SURGERY: [phone] We look forward to caring for you before and throughout the process of your surgery.

## 2024-06-13 NOTE — H&P (View-Only) (Signed)
 REFERRING PHYSICIAN:  Marcelino Nurse, Md [address] [place],  [place] [address]  DOS:  11/27/22   L4-L5 TLIF/PSF   HISTORY OF PRESENT ILLNESS: Randy Kennedy is status post L4-L5 TLIF/PSF.   He underwent a trial for spinal cord stimulation.  He had a greater than 50% improvement in symptoms.  He also had an improvement in his ability to go about his day-to-day life.  Family History  Problem Relation Age of Onset   Colon cancer Mother    Cancer Mother        rectal   Hypertension Mother    Cancer Father 17       prostate   Hypertension Father    Hypertension Sister    Heart attack Maternal Aunt        [place]   Cancer Paternal Uncle        prostate   Diabetes Neg Hx    Stroke Neg Hx    CAD Neg Hx    Rectal cancer Neg Hx    Stomach cancer Neg Hx    Esophageal cancer Neg Hx    Social History   Tobacco Use   Smoking status: Former    Current packs/day: 0.00    Average packs/day: 1 pack/day for 4.0 years (4.0 ttl pk-yrs)    Types: Pipe, Cigarettes    Start date: 07/27/1974    Quit date: 07/27/1978    Years since quitting: 45.9   Smokeless tobacco: Never  Vaping Use   Vaping status: Never Used  Substance Use Topics   Alcohol use: No    Alcohol/week: 0.0 standard drinks of alcohol   Drug use: No    Current Meds  Medication Sig   aspirin  EC 81 MG tablet Take 81 mg by mouth daily.   carvedilol  (COREG ) 25 MG tablet Take 1 tablet (25 mg total) by mouth 2 (two) times daily with a meal.   colchicine  0.6 MG tablet Take 1 tablet (0.6 mg total) by mouth daily as needed (gout flare).   fexofenadine (ALLEGRA) 180 MG tablet Take 180 mg by mouth every morning.   fluticasone  (FLONASE ) 50 MCG/ACT nasal spray SPRAY 2 SPRAYS INTO EACH NOSTRIL EVERY DAY   gabapentin  (NEURONTIN ) 300 MG capsule Take 1 capsule (300 mg total) by mouth 2 (two) times daily AND 2-3 capsules (600-900 mg total) at bedtime.   hydrALAZINE  (APRESOLINE ) 100 MG tablet Take 1 tablet (100 mg total) by mouth 3  (three) times daily.   minoxidil  (LONITEN ) 2.5 MG tablet TAKE 1 TABLET BY MOUTH DAILY AT BEDTIME   montelukast  (SINGULAIR ) 10 MG tablet Take 1 tablet (10 mg total) by mouth at bedtime.   nitroGLYCERIN  (NITROSTAT ) 0.4 MG SL tablet Place 1 tablet (0.4 mg total) under the tongue every 5 (five) minutes as needed for chest pain.   pravastatin  (PRAVACHOL ) 40 MG tablet Take 1 tablet (40 mg total) by mouth daily.   probenecid  (BENEMID ) 500 MG tablet TAKE 1/2 TABLET TWICE A DAY BY MOUTH   spironolactone  (ALDACTONE ) 25 MG tablet Take 1 tablet (25 mg total) by mouth daily.   tirzepatide  5 MG/0.5ML injection vial Inject 5 mg into the skin once a week.   valsartan  (DIOVAN ) 320 MG tablet Take 1 tablet (320 mg total) by mouth daily.   vitamin C (ASCORBIC ACID) 500 MG tablet Take 500 mg by mouth daily.   [DISCONTINUED] predniSONE  (DELTASONE ) 20 MG tablet Take two tablets daily for 3 days followed by one tablet daily for 4 days  Allergies  Allergen Reactions   Amlodipine  Rash    Rash in groin   Allopurinol Other (See Comments)    GI upset   Lisinopril -Hydrochlorothiazide      Burning in feet.   Procardia  [Nifedipine ] Other (See Comments)    Bradycardia, leg swelling   Clonidine  Derivatives Rash   Doxazosin  Mesylate Rash   Latex Rash   Nortriptyline  Rash   Uloric  [Febuxostat ] Rash      PHYSICAL EXAMINATION:  General: Patient is well developed, well nourished, calm, collected, and in no apparent distress.   NEUROLOGICAL:  General: In no acute distress.   Awake, alert, oriented to person, place, and time.  Pupils equal round and reactive to light.  Facial tone is symmetric.     Strength: Walking with assistance  MAEW   ROS (Neurologic):  Negative except as noted above  IMAGING: No complications noted  CT - showed healing through the disc space  Thoracic MRI reviewed, no compression  EMG NCV 01/21/2024 NCV & EMG Findings: Extensive electrodiagnostic testing of the left lower  extremity and additional studies of the right shows:  Bilateral sural and superficial peroneal sensory responses are absent. Bilateral peroneal motor responses are absent at the extensor digitorum brevis, and normal at the tibialis anterior.  Bilateral tibial motor responses show reduced amplitude. Tibial H reflex study on the right is prolonged, and normal on the left. Chronic motor axonal loss changes are seen affecting the anterior tibialis, flexor digitorum longus, rectus femoris, and adductor longus muscles, worse on the left.  There is no evidence of accompanying active denervation.     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     ___________________________ Tonita Blanch, DO  ASSESSMENT/PLAN:  Randy Kennedy is doing fair s/p above surgery.  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  He had an excellent response to spinal cord stimulation.  I think he is an excellent candidate for placement of device.  Will arrange for permanent implantation.    I discussed the planned procedure at length with the patient, including the risks, benefits, alternatives, and indications. The risks discussed include but are not limited to bleeding, infection, need for reoperation, spinal fluid leak, stroke, vision loss, anesthetic complication, coma, paralysis, and even death. I also described in detail that improvement was not guaranteed.  The patient expressed understanding of these risks, and asked that we proceed with surgery. I described the surgery in layman's terms, and gave ample opportunity for questions, which were answered to the best of my ability.  I spent a total of 20 minutes in this patient's care today. This time was spent reviewing pertinent records including imaging studies, obtaining and confirming history, performing a directed  evaluation, formulating and discussing my recommendations, and documenting the visit within the medical record.   Reeves Daisy, MD  Department of neurosurgery

## 2024-06-13 NOTE — Progress Notes (Signed)
 REFERRING PHYSICIAN:  Marcelino Nurse, Md [address] [place],  [place] [address]  DOS:  11/27/22   L4-L5 TLIF/PSF   HISTORY OF PRESENT ILLNESS: Randy Kennedy is status post L4-L5 TLIF/PSF.   He underwent a trial for spinal cord stimulation.  He had a greater than 50% improvement in symptoms.  He also had an improvement in his ability to go about his day-to-day life.  Family History  Problem Relation Age of Onset   Colon cancer Mother    Cancer Mother        rectal   Hypertension Mother    Cancer Father 17       prostate   Hypertension Father    Hypertension Sister    Heart attack Maternal Aunt        [place]   Cancer Paternal Uncle        prostate   Diabetes Neg Hx    Stroke Neg Hx    CAD Neg Hx    Rectal cancer Neg Hx    Stomach cancer Neg Hx    Esophageal cancer Neg Hx    Social History   Tobacco Use   Smoking status: Former    Current packs/day: 0.00    Average packs/day: 1 pack/day for 4.0 years (4.0 ttl pk-yrs)    Types: Pipe, Cigarettes    Start date: 07/27/1974    Quit date: 07/27/1978    Years since quitting: 45.9   Smokeless tobacco: Never  Vaping Use   Vaping status: Never Used  Substance Use Topics   Alcohol use: No    Alcohol/week: 0.0 standard drinks of alcohol   Drug use: No    Current Meds  Medication Sig   aspirin  EC 81 MG tablet Take 81 mg by mouth daily.   carvedilol  (COREG ) 25 MG tablet Take 1 tablet (25 mg total) by mouth 2 (two) times daily with a meal.   colchicine  0.6 MG tablet Take 1 tablet (0.6 mg total) by mouth daily as needed (gout flare).   fexofenadine (ALLEGRA) 180 MG tablet Take 180 mg by mouth every morning.   fluticasone  (FLONASE ) 50 MCG/ACT nasal spray SPRAY 2 SPRAYS INTO EACH NOSTRIL EVERY DAY   gabapentin  (NEURONTIN ) 300 MG capsule Take 1 capsule (300 mg total) by mouth 2 (two) times daily AND 2-3 capsules (600-900 mg total) at bedtime.   hydrALAZINE  (APRESOLINE ) 100 MG tablet Take 1 tablet (100 mg total) by mouth 3  (three) times daily.   minoxidil  (LONITEN ) 2.5 MG tablet TAKE 1 TABLET BY MOUTH DAILY AT BEDTIME   montelukast  (SINGULAIR ) 10 MG tablet Take 1 tablet (10 mg total) by mouth at bedtime.   nitroGLYCERIN  (NITROSTAT ) 0.4 MG SL tablet Place 1 tablet (0.4 mg total) under the tongue every 5 (five) minutes as needed for chest pain.   pravastatin  (PRAVACHOL ) 40 MG tablet Take 1 tablet (40 mg total) by mouth daily.   probenecid  (BENEMID ) 500 MG tablet TAKE 1/2 TABLET TWICE A DAY BY MOUTH   spironolactone  (ALDACTONE ) 25 MG tablet Take 1 tablet (25 mg total) by mouth daily.   tirzepatide  5 MG/0.5ML injection vial Inject 5 mg into the skin once a week.   valsartan  (DIOVAN ) 320 MG tablet Take 1 tablet (320 mg total) by mouth daily.   vitamin C (ASCORBIC ACID) 500 MG tablet Take 500 mg by mouth daily.   [DISCONTINUED] predniSONE  (DELTASONE ) 20 MG tablet Take two tablets daily for 3 days followed by one tablet daily for 4 days  Allergies  Allergen Reactions   Amlodipine  Rash    Rash in groin   Allopurinol Other (See Comments)    GI upset   Lisinopril -Hydrochlorothiazide      Burning in feet.   Procardia  [Nifedipine ] Other (See Comments)    Bradycardia, leg swelling   Clonidine  Derivatives Rash   Doxazosin  Mesylate Rash   Latex Rash   Nortriptyline  Rash   Uloric  [Febuxostat ] Rash      PHYSICAL EXAMINATION:  General: Patient is well developed, well nourished, calm, collected, and in no apparent distress.   NEUROLOGICAL:  General: In no acute distress.   Awake, alert, oriented to person, place, and time.  Pupils equal round and reactive to light.  Facial tone is symmetric.     Strength: Walking with assistance  MAEW   ROS (Neurologic):  Negative except as noted above  IMAGING: No complications noted  CT - showed healing through the disc space  Thoracic MRI reviewed, no compression  EMG NCV 01/21/2024 NCV & EMG Findings: Extensive electrodiagnostic testing of the left lower  extremity and additional studies of the right shows:  Bilateral sural and superficial peroneal sensory responses are absent. Bilateral peroneal motor responses are absent at the extensor digitorum brevis, and normal at the tibialis anterior.  Bilateral tibial motor responses show reduced amplitude. Tibial H reflex study on the right is prolonged, and normal on the left. Chronic motor axonal loss changes are seen affecting the anterior tibialis, flexor digitorum longus, rectus femoris, and adductor longus muscles, worse on the left.  There is no evidence of accompanying active denervation.     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     ___________________________ Tonita Blanch, DO  ASSESSMENT/PLAN:  Randy Kennedy is doing fair s/p above surgery.  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  He had an excellent response to spinal cord stimulation.  I think he is an excellent candidate for placement of device.  Will arrange for permanent implantation.    I discussed the planned procedure at length with the patient, including the risks, benefits, alternatives, and indications. The risks discussed include but are not limited to bleeding, infection, need for reoperation, spinal fluid leak, stroke, vision loss, anesthetic complication, coma, paralysis, and even death. I also described in detail that improvement was not guaranteed.  The patient expressed understanding of these risks, and asked that we proceed with surgery. I described the surgery in layman's terms, and gave ample opportunity for questions, which were answered to the best of my ability.  I spent a total of 20 minutes in this patient's care today. This time was spent reviewing pertinent records including imaging studies, obtaining and confirming history, performing a directed  evaluation, formulating and discussing my recommendations, and documenting the visit within the medical record.   Randy Daisy, MD  Department of neurosurgery

## 2024-06-13 NOTE — Telephone Encounter (Signed)
   Name: Randy Kennedy  DOB: 1951/06/24  MRN: [id number]  Primary Cardiologist: Evalene Lunger, MD  Chart reviewed as part of pre-operative protocol coverage. Because of Randy Kennedy's past medical history and time since last visit, he will require a follow-up in-office visit in order to better assess preoperative cardiovascular risk.  Pre-op covering staff: - Please schedule appointment and call patient to inform them. If patient already had an upcoming appointment within acceptable timeframe, please add pre-op clearance to the appointment notes so provider is aware. - Please contact requesting surgeon's office via preferred method (i.e, phone, fax) to inform them of need for appointment prior to surgery.     Jon Nat Hails, PA  06/13/2024, 3:33 PM

## 2024-06-13 NOTE — Telephone Encounter (Signed)
 Pt returned call - please advise

## 2024-06-13 NOTE — Progress Notes (Signed)
 Remote PPM Transmission

## 2024-06-13 NOTE — Telephone Encounter (Signed)
   Pre-operative Risk Assessment    Patient Name: Randy Kennedy  DOB: 1951/01/07 MRN: [id number]   Date of last office visit: N/A Date of next office visit: N/A   Request for Surgical Clearance    Procedure:  Thoracic Laminectomy for Spinal Cord Stimulator Placement ([employer])  Date of Surgery:  Clearance 07/05/24                                Surgeon:  Reeves POUR. Clois COME, MPHS Surgeon's Group or Practice Name:  [facility] number:  [phone] Fax number:  [phone]   Type of Clearance Requested:   - Medical  - Pharmacy:  Hold Aspirin  and TIRZEPATIDE  INJECTION TO HOLD FOR 7 DAYS PRIOR TO SURGERY (PER ANESTHESIA GUIDELINES DUE TO THE INCREASED RISK OF ASPIRATION CAUSED BY DELAYED GASTRIC EMPTYING).      Type of Anesthesia:  General   Additional requests/questions:  NONE  Signed, Apolinar Essex   06/13/2024, 2:02 PM

## 2024-06-13 NOTE — Telephone Encounter (Signed)
 Tried contacting patient to schedule office visit no answer left a detailed vm to call back and schedule

## 2024-06-13 NOTE — Telephone Encounter (Signed)
 S/w the pt and he has been scheduled to see Randy Kennedy, [facility] 06/21/24 @ 8:25 in [place] office.

## 2024-06-13 NOTE — Addendum Note (Signed)
 Addended by: Mystie Ormand on: 06/13/2024 09:28 AM   Modules accepted: Orders

## 2024-06-14 ENCOUNTER — Telehealth: Payer: Self-pay

## 2024-06-14 NOTE — Telephone Encounter (Signed)
 Case discussed with EP.  No contraindication to spinal cord stimulator with pacemaker in place.

## 2024-06-14 NOTE — Telephone Encounter (Signed)
 Received surgical clearance form for patient. Placed in pending folder. Called patient set up for office visit for 12/5.

## 2024-06-20 NOTE — Progress Notes (Unsigned)
 Cardiology Office Note:    Date:  06/21/2024   ID:  Randy Kennedy, DOB 07-30-1950, MRN [id number]  PCP:  Rilla Baller, MD   [facility] Providers Cardiologist:  Evalene Lunger, MD Electrophysiologist:  Will Gladis Norton, MD     Referring MD: Rilla Baller, MD   Chief Complaint: pre-operative cardiac risk stratification  History of Present Illness:    Randy Kennedy is a 73 y.o. male with PMH of sick sinus syndrome s/p PPM 02/2022, resistant hypertension, hyperlipidemia, obesity, OSA nonadherent to CPAP/BiPAP, and multiple medication intolerances.  He had a remote nuclear stress test from 11/2011 which showed no evidence of ischemia, normal LV function. 02/2022 echocardiogram showed EF of 60-65%, G2DD, normal RV function, moderately enlarged RV, moderately dilated LA, severely dilated RA, trivial AI, and aortic valve sclerosis without stenosis. 04/2022 renal artery USD without evidence of RAS.   He was seen by general cardiology 08/2023 with reports of chronic shortness of breath and chronic pain. No cardiac testing was indicated and he was scheduled for follow-up in one year.  Today he presents for pre-operative cardiac risk stratification for thoracic laminectomy for Medtronic spinal cord stimulator implantation. He is doing well overall, denying chest pain, increased shortness of breath, palpitations, abnormal bleeding. He is taking all medications as prescribed, noting continued elevated BP at home 160s/70s. Functional status is limited by chronic back pain. No dizziness, near-syncope, or syncope. Chronic dyspnea is stable.   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 11/2011 normal stress test performed in [place]  - see chart.   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    a.) s/p LEFT TM repair in ~1994   Heartburn    mild, controlled with pepcid    Hepatitis    at age 54 years old   History of bilateral cataract extraction 11/2020   History of chicken pox    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  moderate, AHI 37.4 increased to 80 in REM, on BiPAP 14/10, 97% compliance >4 hrs (06/2013); b. Does not tolerate CPAP.   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 Treated with vicodin, prior tried  fentanyl  and butrans.   Sinus node dysfunction (HCC)    a.) s/p Abbott Assurity MRI dual-chamber pacemaker placement 02/27/2022   Sleep apnea 2008   Spondylolisthesis of lumbar region    Systolic murmur    Thrombocytopenia    Wears dentures    Partial lower   Wears hearing aid in both ears    Does not wear all the time   Past Surgical History:  Procedure Laterality Date   APPLICATION OF INTRAOPERATIVE CT SCAN N/A 11/27/2022   Procedure: APPLICATION OF INTRAOPERATIVE CT SCAN;  Surgeon: Clois Fret, MD;  Location: [facility];  Service: Neurosurgery;  Laterality: N/A;   CARPAL TUNNEL RELEASE Left 01/18/2024   Procedure: CARPAL TUNNEL RELEASE;  Surgeon: Claudene Penne ORN, MD;  Location: [facility];  Service: Neurosurgery;  Laterality: Left;  MIS LEFT CARPAL TUNNELTUNNEL RELEASE WITH ULTRASOUND, POSSIBLE CONVERTION TO OPEN   CARPAL TUNNEL RELEASE Right 02/24/2024   Procedure: CARPAL TUNNEL RELEASE;  Surgeon: Claudene Penne ORN, MD;  Location: [facility];  Service: Neurosurgery;  Laterality: Right;  RIGHT CARPAL TUNNEL RELEASE WITH ULTRASOUND GUIDANCE   CATARACT EXTRACTION W/PHACO Right 11/26/2020   Procedure: CATARACT EXTRACTION PHACO AND INTRAOCULAR LENS PLACEMENT (IOC) RIGHT VIVITY TORIC LENS 7.17 00:49.6;  Surgeon: Jaye Fallow, MD;  Location: [facility];  Service: OphthalmologY   CATARACT EXTRACTION W/PHACO Left 12/10/2020   Procedure: CATARACT EXTRACTION PHACO AND INTRAOCULAR LENS PLACEMENT (IOC) LEFT VIVITY;  Surgeon: Jaye Fallow, MD;  Location: [facility];  Service: Ophthalmology; LENS 5.3700:32.7   COLONOSCOPY  02/2011   ext hem, benign polyp, rpt 5 yrs ([place] )   COLONOSCOPY WITH PROPOFOL  N/A 12/17/2020   multiple TA, ext hem, rpt 3 yrs ([place], Corinn Skiff, MD)   FRACTURE SURGERY  1985   INSERT / REPLACE / REMOVE PACEMAKER  Sept 2023   LEG SURGERY  x5   left - after fall at work (3.5 stories)   MANDIBLE SURGERY  1965   jaw fracture - horse kick   MOHS  SURGERY Right 07/03/2021   Ear   OPERATIVE ULTRASOUND Right 02/24/2024   Procedure: US  INTRAOPERATIVE;  Surgeon: Claudene Penne ORN, MD;  Location: [facility];  Service: Neurosurgery;  Laterality: Right;   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   Procedure: PACEMAKER IMPLANT;  Surgeon: Inocencio Soyla Lunger, MD;  Location: [facility];  Service: Cardiovascular;  Laterality: N/A;   TRANSFORAMINAL LUMBAR INTERBODY FUSION W/ MIS 1 LEVEL N/A 11/27/2022   Procedure: L4-5 MINIMALLY INVASIVE (MIS) TRANSFORAMINAL LUMBAR INTERBODY FUSION (TLIF);  Surgeon: Clois Fret, MD;  Location: [facility];  Service: Neurosurgery;  Laterality: N/A;   TYMPANIC MEMBRANE REPAIR Left 1994   otosclerosis   Current Medications: Current Meds  Medication Sig   aspirin  EC 81 MG tablet Take 81 mg by mouth daily.   carvedilol  (COREG ) 25 MG tablet Take 1 tablet (  25 mg total) by mouth 2 (two) times daily with a meal.   colchicine  0.6 MG tablet Take 1 tablet (0.6 mg total) by mouth daily as needed (gout flare).   fexofenadine (ALLEGRA) 180 MG tablet Take 180 mg by mouth every morning.   fluticasone  (FLONASE ) 50 MCG/ACT nasal spray SPRAY 2 SPRAYS INTO EACH NOSTRIL EVERY DAY   gabapentin  (NEURONTIN ) 300 MG capsule Take 1 capsule (300 mg total) by mouth 2 (two) times daily AND 2-3 capsules (600-900 mg total) at bedtime.   hydrALAZINE  (APRESOLINE ) 100 MG tablet Take 1 tablet (100 mg total) by mouth 3 (three) times daily.   minoxidil  (LONITEN ) 2.5 MG tablet TAKE 1 TABLET BY MOUTH DAILY AT BEDTIME   montelukast  (SINGULAIR ) 10 MG tablet Take 1 tablet (10 mg total) by mouth at bedtime.   nitroGLYCERIN  (NITROSTAT ) 0.4 MG SL tablet Place 1 tablet (0.4 mg total) under the tongue every 5 (five) minutes as needed for chest pain.   pravastatin  (PRAVACHOL ) 40 MG tablet Take 1 tablet (40 mg total) by mouth daily.   probenecid  (BENEMID ) 500 MG tablet TAKE 1/2 TABLET TWICE A DAY BY MOUTH   tirzepatide  5 MG/0.5ML injection vial Inject 5 mg into  the skin once a week.   valsartan  (DIOVAN ) 320 MG tablet Take 1 tablet (320 mg total) by mouth daily.   vitamin C (ASCORBIC ACID) 500 MG tablet Take 500 mg by mouth daily.    Allergies:   Amlodipine , Allopurinol, Lisinopril -hydrochlorothiazide , Procardia  [nifedipine ], Clonidine  derivatives, Doxazosin  mesylate, Latex, Nortriptyline , and Uloric  [febuxostat ]   Social History   Socioeconomic History   Marital status: Married    Spouse name: Mathurin,Lynn (Spouse)   Number of children: Not on file   Years of education: Not on file   Highest education level: Master's degree (e.g., MA, MS, MEng, MEd, MSW, MBA)  Occupational History   Not on file  Tobacco Use   Smoking status: Former    Current packs/day: 0.00    Average packs/day: 1 pack/day for 4.0 years (4.0 ttl pk-yrs)    Types: Pipe, Cigarettes    Start date: 07/27/1974    Quit date: 07/27/1978    Years since quitting: 45.9   Smokeless tobacco: Never  Vaping Use   Vaping status: Never Used  Substance and Sexual Activity   Alcohol use: No    Alcohol/week: 0.0 standard drinks of alcohol   Drug use: No   Sexual activity: Not on file  Other Topics Concern   Not on file  Social History Narrative   Caffeine: 2 cans coke/day   Lives with wife and grown son    Occupation: retired, was administrator, arts.   On disability for chronic pain   Edu: MBA   Activity: no regular exercise   Deit: good water , fruits/vegetables daily   Social Drivers of Corporate Investment Banker Strain: Low Risk  (02/03/2024)   Overall Financial Resource Strain (CARDIA)    Difficulty of Paying Living Expenses: Not hard at all  Food Insecurity: No Food Insecurity (02/03/2024)   Hunger Vital Sign    Worried About Running Out of Food in the Last Year: Never true    Ran Out of Food in the Last Year: Never true  Transportation Needs: No Transportation Needs (02/03/2024)   PRAPARE - Administrator, Civil Service (Medical): No    Lack of  Transportation (Non-Medical): No  Physical Activity: Inactive (02/03/2024)   Exercise Vital Sign    Days of Exercise per Week:  0 days    Minutes of Exercise per Session: Not on file  Stress: No Stress Concern Present (02/03/2024)   [employer] of Occupational Health - Occupational Stress Questionnaire    Feeling of Stress: Not at all  Social Connections: Socially Integrated (02/03/2024)   Social Connection and Isolation Panel    Frequency of Communication with Friends and Family: Three times a week    Frequency of Social Gatherings with Friends and Family: Once a week    Attends Religious Services: 1 to 4 times per year    Active Member of [employer] or Organizations: Yes    Attends Banker Meetings: 1 to 4 times per year    Marital Status: Married    ROS:   Please see the history of present illness. All other systems reviewed and are negative.  EKGs/Labs/Other Studies Reviewed:    The following studies were reviewed today: EKG: atrial-paced rhythm 60 bpm Echo 02/26/22: LVEF 60-65%, G2DD, normal RV function, RV moderately enlarged, LA moderately dilated, RA severely dilated, no mitral regurgitation or stenosis, mild tricuspid regurgitation, tricuspid AV with trivial aortic regurgitation, mild pulmonic regurgitation Remote myocardial perfusion test: stable  EKG Interpretation Date/Time:  Wednesday June 21 2024 08:44:40 EST Ventricular Rate:  60 PR Interval:  230 QRS Duration:  156 QT Interval:  420 QTC Calculation: 420 R Axis:   -26  Text Interpretation: Atrial-paced rhythm with prolonged AV conduction baseline artifact When compared with ECG of 30-Aug-2023 10:30, Confirmed by Nola Numbers ([id number]) on 06/21/2024 8:48:54 AM    Recent Labs: 01/31/2024: ALT 18; BUN 22; Creatinine, Ser 1.12; Hemoglobin 12.7; Platelets 192.0 Repeated and verified X2.; Potassium 4.4; Sodium 139  Recent Lipid Panel    Component Value Date/Time   CHOL 170 01/31/2024 1240   CHOL 130  02/09/2012 0000   TRIG 247.0 (H) 01/31/2024 1240   TRIG 142 02/09/2012 0000   HDL 57.50 01/31/2024 1240   CHOLHDL 3 01/31/2024 1240   VLDL 49.4 (H) 01/31/2024 1240   LDLCALC 63 01/31/2024 1240   LDLCALC 47 02/09/2012 0000   LDLDIRECT 83.0 03/18/2022 1142   Risk Assessment/Calculations:    PREOPERATIVE CARDIAC RISK ASSESSMENT   Revised Cardiac Risk Index Leobardo Criteria) High Risk Surgery: no High Risk Surgery is defined as: Intraperitoneal, intrathoracic or suprainguinal vascular CAD: no CAD is defined as: history of myocardial infarction, positive exercise test, current complaint of ischemic chest pain or use of nitrate therapy, or ECG with Q waves. Patients with prior CABG surgery or PTCA are included only if they have current complaints of chest pain that is presumed to be due to ischemia.  CHF: no History of congestive heart failure, pulmonary edema or paroxsymal nocturnal dyspnea; physical examination showing bilateral rales or S3 gallop, or chest radiograph showing pulmonary vascular redistribution Cerebrovascular Disease: no Cerebrovascular Disease is defined as: history of transient ischemic attack or stroke Diabetes on Insulin: no CKD (SCr >2 mg/dL): no  Total: 0.4 Estimated Risk of Adverse Outcome with Non-cardiac Surgery: low risk Estimated Risk of Myocardial Infarction, Pulmonary Edema, Ventricular Fibrillation, Cardiac Arrest or Complete Heart Block: 4%         Physical Exam:    VS:  BP 120/70 (BP Location: Left Arm, Patient Position: Sitting, Cuff Size: Normal)   Pulse 60   Ht 5' 7 (1.702 m)   Wt 199 lb 2 oz (90.3 kg)   SpO2 98%   BMI 31.19 kg/m     Wt Readings from Last 3 Encounters:  06/21/24 199  lb 2 oz (90.3 kg)  06/13/24 200 lb 4 oz (90.8 kg)  05/24/24 200 lb (90.7 kg)    GEN: Well nourished, well developed in no acute distress HEENT: Normal NECK: No JVD, no carotid bruits LYMPHATICS: No lymphadenopathy CARDIAC: RRR, no murmurs, rubs,  gallops RESPIRATORY:  Clear to auscultation ABDOMEN: Soft, non-tender, non-distended MUSCULOSKELETAL:  No edema, no deformity  SKIN: Warm and dry NEUROLOGIC:  Alert and oriented x 3 PSYCHIATRIC:  Normal affect   ASSESSMENT & PLAN:   1. Preoperative cardiac clearance evaluation: He is doing well from a cardiac standpoint without angina symptoms. Stable chronic SOB. Per Duke Activity Status Index, he can achieve greater than 4 METs without cardiac limitation. Main limitation is due to chronic pain. Per Revised Cardiac Risk Index, he has low risk for noncardiac surgery with an estimated rate of 4% for adverse cardiac event in the periprocedural timeframe No further cardiac testing indicated.  - He may proceed with noncardiac surgery at an overall low risk without further cardiac testing  2. Sick sinus syndrome: s/p PPM 2023. Stable device interrogation 06/13/24. EKG today showing atrial-paced rhythm HR 60, stable.  - Continue following with EP   3. Hypertension: BP 120/70 today, well-controlled in the office, though reports higher at home. Question accuracy of home cuff versus subsequent rise due to chronic back pain. He reports taking all medications as prescribed. - Continue carvedilol , hydralazine , minoxidil , spironolactone , valsartan   4. Hyperlipidemia: 01/31/24 LDL 63, HDL 57, TGs 247, total 170. TGs elevated, unclear if this was a fasting lipid panel.  - Continue pravastatin  - May could benefit from fenofibrate  in the future - Managed by PCP  5. OSA: noncompliant with CPAP - Encouraged compliance  Disposition: Follow-up with Dr. Gollan or Bernardino Bring PA in 6 months.   Medication Adjustments/Labs and Tests Ordered: Current medicines are reviewed at length with the patient today.  Concerns regarding medicines are outlined above.  Orders Placed This Encounter  Procedures   EKG 12-Lead   No orders of the defined types were placed in this encounter.   Patient Instructions  Medication  Instructions:  Your physician recommends that you continue on your current medications as directed. Please refer to the Current Medication list given to you today.   *If you need a refill on your cardiac medications before your next appointment, please call your pharmacy*  Lab Work: None ordered at this time   Follow-Up: At [facility], you and your health needs are our priority.  As part of our continuing mission to provide you with exceptional heart care, our providers are all part of one team.  This team includes your primary Cardiologist (physician) and Advanced Practice Providers or APPs (Physician Assistants and Nurse Practitioners) who all work together to provide you with the care you need, when you need it.  Your next appointment:   6 month(s)  Provider:   You may see Timothy Gollan, MD or Bernardino Bring, PA-C  Other Instructions Ryan's progress note will document your clearance for surgery   Signed, Saddie GORMAN Cleaves, NP  06/21/2024 9:17 AM    [facility]

## 2024-06-21 ENCOUNTER — Ambulatory Visit: Attending: Physician Assistant

## 2024-06-21 ENCOUNTER — Encounter: Payer: Self-pay | Admitting: Physician Assistant

## 2024-06-21 VITALS — BP 120/70 | HR 60 | Ht 67.0 in | Wt 199.1 lb

## 2024-06-21 DIAGNOSIS — R001 Bradycardia, unspecified: Secondary | ICD-10-CM | POA: Diagnosis not present

## 2024-06-21 DIAGNOSIS — E782 Mixed hyperlipidemia: Secondary | ICD-10-CM | POA: Diagnosis not present

## 2024-06-21 DIAGNOSIS — I495 Sick sinus syndrome: Secondary | ICD-10-CM | POA: Diagnosis not present

## 2024-06-21 DIAGNOSIS — Z79899 Other long term (current) drug therapy: Secondary | ICD-10-CM | POA: Diagnosis not present

## 2024-06-21 DIAGNOSIS — Z01810 Encounter for preprocedural cardiovascular examination: Secondary | ICD-10-CM | POA: Diagnosis not present

## 2024-06-21 DIAGNOSIS — G4733 Obstructive sleep apnea (adult) (pediatric): Secondary | ICD-10-CM | POA: Diagnosis not present

## 2024-06-21 DIAGNOSIS — I10 Essential (primary) hypertension: Secondary | ICD-10-CM

## 2024-06-21 NOTE — Patient Instructions (Signed)
 Medication Instructions:  Your physician recommends that you continue on your current medications as directed. Please refer to the Current Medication list given to you today.   *If you need a refill on your cardiac medications before your next appointment, please call your pharmacy*  Lab Work: None ordered at this time   Follow-Up: At [facility], you and your health needs are our priority.  As part of our continuing mission to provide you with exceptional heart care, our providers are all part of one team.  This team includes your primary Cardiologist (physician) and Advanced Practice Providers or APPs (Physician Assistants and Nurse Practitioners) who all work together to provide you with the care you need, when you need it.  Your next appointment:   6 month(s)  Provider:   You may see Timothy Gollan, MD or Bernardino Bring, PA-C  Other Instructions Ryan's progress note will document your clearance for surgery

## 2024-06-28 ENCOUNTER — Inpatient Hospital Stay
Admission: RE | Admit: 2024-06-28 | Discharge: 2024-06-28 | Disposition: A | Source: Ambulatory Visit | Attending: Neurosurgery | Admitting: Neurosurgery

## 2024-06-28 ENCOUNTER — Encounter: Payer: Self-pay | Admitting: Cardiology

## 2024-06-28 ENCOUNTER — Other Ambulatory Visit: Payer: Self-pay

## 2024-06-28 DIAGNOSIS — Z01812 Encounter for preprocedural laboratory examination: Secondary | ICD-10-CM | POA: Diagnosis not present

## 2024-06-28 DIAGNOSIS — M4316 Spondylolisthesis, lumbar region: Secondary | ICD-10-CM | POA: Diagnosis not present

## 2024-06-28 DIAGNOSIS — Z01818 Encounter for other preprocedural examination: Secondary | ICD-10-CM

## 2024-06-28 HISTORY — DX: Other chronic pain: G89.29

## 2024-06-28 NOTE — Patient Instructions (Addendum)
 Your procedure is scheduled on: [facility] 07/05/24 Report to the Registration Desk on the 1st floor of the Medical Mall. To find out your arrival time, please call [phone] between 1PM - 3PM on: TUESDAY 07/04/24  If your arrival time is 6:00 am, do not arrive before that time as the Medical Mall entrance doors do not open until 6:00 am.  REMEMBER: Instructions that are not followed completely may result in serious medical risk, up to and including death; or upon the discretion of your surgeon and anesthesiologist your surgery may need to be rescheduled.  Do not eat food after midnight the night before surgery.  No gum chewing or hard candies.  You may however, drink CLEAR liquids up to 2 hours before you are scheduled to arrive for your surgery. Do not drink anything within 2 hours of your scheduled arrival time.  Clear liquids include: - water   - apple juice without pulp - gatorade (not RED colors) - black coffee or tea (Do NOT add milk or creamers to the coffee or tea)  Do NOT drink anything that is not on this list.  Stop ANY OVER THE COUNTER supplements until after surgery.  STOP tirzepatide / ZEPBOUND  7 days before surgery.   You may however, continue to take Tylenol  if needed for pain up until the day of surgery.  Continue taking all of your other prescription medications up until the day of surgery.  ON THE DAY OF SURGERY ONLY TAKE THESE MEDICATIONS WITH SIPS OF WATER :  carvedilol  (COREG )  gabapentin  (NEURONTIN )  pravastatin  (PRAVACHOL )   Use inhalers on the day of surgery and bring to the hospital.  No Alcohol for 24 hours before or after surgery.  No Smoking including e-cigarettes for 24 hours before surgery.  No chewable tobacco products for at least 6 hours before surgery.  No nicotine patches on the day of surgery.  Do not use any recreational drugs for at least a week (preferably 2 weeks) before your surgery.  Please be advised that the combination of  cocaine and anesthesia may have negative outcomes, up to and including death. If you test positive for cocaine, your surgery will be cancelled.  On the morning of surgery brush your teeth with toothpaste and water , you may rinse your mouth with mouthwash if you wish. Do not swallow any toothpaste or mouthwash.  Use CHG Soap or wipes as directed on instruction sheet.  Do not wear jewelry, make-up, hairpins, clips or nail polish.  For welded (permanent) jewelry: bracelets, anklets, waist bands, etc.  Please have this removed prior to surgery.  If it is not removed, there is a chance that hospital personnel will need to cut it off on the day of surgery.  Do not wear lotions, powders, or perfumes.   Do not shave body hair from the neck down 48 hours before surgery.  Contact lenses, hearing aids and dentures may not be worn into surgery.   Do not bring valuables to the hospital. [facility] is not responsible for any missing/lost belongings or valuables.   Bring your C-PAP to the hospital in case you may have to spend the night.   Notify your doctor if there is any change in your medical condition (cold, fever, infection).  Wear comfortable clothing (specific to your surgery type) to the hospital.  After surgery, you can help prevent lung complications by doing breathing exercises.  Take deep breaths and cough every 1-2 hours. Your doctor may order a device called an Facilities Manager  to help you take deep breaths.  If you are being discharged the day of surgery, you will not be allowed to drive home. You will need a responsible individual to drive you home and stay with you for 24 hours after surgery.   If you are taking public transportation, you will need to have a responsible individual with you.  Please call the Pre-admissions Testing Dept. at [phone] if you have any questions about these instructions.  Surgery Visitation Policy:  Patients having surgery or a procedure  may have two visitors.  Children under the age of 48 must have an adult with them who is not the patient.  Merchandiser, Retail to address health-related social needs:  [email or web address]    Pre-operative 4 CHG Bath Instructions   You can play a key role in reducing the risk of infection after surgery. Your skin needs to be as free of germs as possible. You can reduce the number of germs on your skin by washing with CHG (chlorhexidine  gluconate) soap before surgery. CHG is an antiseptic soap that kills germs and continues to kill germs even after washing.   DO NOT use if you have an allergy to chlorhexidine /CHG or antibacterial soaps. If your skin becomes reddened or irritated, stop using the CHG and notify one of our RNs at [phone].   Please shower with the CHG soap starting 4 days before surgery using the following schedule:     Please keep in mind the following:  DO NOT shave, including legs and underarms, starting the day of your first shower.   You may shave your face at any point before/day of surgery.  Place clean sheets on your bed the day you start using CHG soap. Use a clean washcloth (not used since being washed) for each shower. DO NOT sleep with pets once you start using the CHG.   CHG Shower Instructions:  If you choose to wash your hair and private area, wash first with your normal shampoo/soap.  After you use shampoo/soap, rinse your hair and body thoroughly to remove shampoo/soap residue.  Turn the water  OFF and apply about 3 tablespoons (45 ml) of CHG soap to a CLEAN washcloth.  Apply CHG soap ONLY FROM YOUR NECK DOWN TO YOUR TOES (washing for 3-5 minutes)  DO NOT use CHG soap on face, private areas, open wounds, or sores.  Pay special attention to the area where your surgery is being performed.  If you are having back surgery, having someone wash your back for you may be helpful. Wait 2 minutes after CHG soap is applied, then you may rinse  off the CHG soap.  Pat dry with a clean towel  Put on clean clothes/pajamas   If you choose to wear lotion, please use ONLY the CHG-compatible lotions on the back of this paper.     Additional instructions for the day of surgery: DO NOT APPLY any lotions, deodorants, cologne, or perfumes.   Put on clean/comfortable clothes.  Brush your teeth.  Ask your nurse before applying any prescription medications to the skin.      CHG Compatible Lotions   Aveeno Moisturizing lotion  Cetaphil Moisturizing Cream  Cetaphil Moisturizing Lotion  Clairol Herbal Essence Moisturizing Lotion, Dry Skin  Clairol Herbal Essence Moisturizing Lotion, Extra Dry Skin  Clairol Herbal Essence Moisturizing Lotion, Normal Skin  Curel Age Defying Therapeutic Moisturizing Lotion with Alpha Hydroxy  Curel Extreme Care Body Lotion  Curel Soothing Hands Moisturizing Hand Lotion  Curel Therapeutic Moisturizing  Cream, Fragrance-Free  Curel Therapeutic Moisturizing Lotion, Fragrance-Free  Curel Therapeutic Moisturizing Lotion, Original Formula  Eucerin Daily Replenishing Lotion  Eucerin Dry Skin Therapy Plus Alpha Hydroxy Crme  Eucerin Dry Skin Therapy Plus Alpha Hydroxy Lotion  Eucerin Original Crme  Eucerin Original Lotion  Eucerin Plus Crme Eucerin Plus Lotion  Eucerin TriLipid Replenishing Lotion  Keri Anti-Bacterial Hand Lotion  Keri Deep Conditioning Original Lotion Dry Skin Formula Softly Scented  Keri Deep Conditioning Original Lotion, Fragrance Free Sensitive Skin Formula  Keri Lotion Fast Absorbing Fragrance Free Sensitive Skin Formula  Keri Lotion Fast Absorbing Softly Scented Dry Skin Formula  Keri Original Lotion  Keri Skin Renewal Lotion Keri Silky Smooth Lotion  Keri Silky Smooth Sensitive Skin Lotion  Nivea Body Creamy Conditioning Oil  Nivea Body Extra Enriched Teacher, Adult Education Moisturizing Lotion Nivea Crme  Nivea Skin Firming Lotion  NutraDerm 30  Skin Lotion  NutraDerm Skin Lotion  NutraDerm Therapeutic Skin Cream  NutraDerm Therapeutic Skin Lotion  ProShield Protective Hand Cream  Provon moisturizing lotion

## 2024-06-28 NOTE — Progress Notes (Signed)
 PERIOPERATIVE PRESCRIPTION FOR IMPLANTED CARDIAC DEVICE PROGRAMMING  Patient Information: Name:  Randy Kennedy  DOB:  December 26, 1950  MRN:  [id number]    Planned Procedure: THORACIC LAMINECTOMY FOR SPINAL CORD STIMULATOR PLACEMENT   Surgeon:  Dr. Reeves Daisy, MD  Requesting device clearance: Dorise Pereyra, FNP-C  Date of Procedure: 07/05/2024  Cautery will be used.   Please route documentation back me via CHL. No need to FAX report to [facility] PAT APP. I will follow up on device clearance in CHL.  Device Information:  Clinic EP Physician:  Soyla Norton, MD   Device Type:  Pacemaker Manufacturer and Phone #:  [employer]: [phone] Pacemaker Dependent?:  Yes.   Date of Last Device Check:  06/08/2024  Normal Device Function?:  Yes.    Electrophysiologist's Recommendations:  Have magnet available. Provide continuous ECG monitoring when magnet is used or reprogramming is to be performed.  Procedure will likely interfere with device function.  Device should be programmed:  Asynchronous pacing during procedure and returned to normal programming after procedured  Per Device Clinic Standing Orders, Almarie ONEIDA Shutter, RN  2:53 PM 06/28/2024

## 2024-06-29 ENCOUNTER — Inpatient Hospital Stay: Admission: RE | Admit: 2024-06-29 | Discharge: 2024-06-29 | Attending: Neurosurgery | Admitting: Neurosurgery

## 2024-06-29 DIAGNOSIS — Z01818 Encounter for other preprocedural examination: Secondary | ICD-10-CM

## 2024-06-29 DIAGNOSIS — M4316 Spondylolisthesis, lumbar region: Secondary | ICD-10-CM

## 2024-06-29 DIAGNOSIS — I10 Essential (primary) hypertension: Secondary | ICD-10-CM

## 2024-06-29 LAB — BASIC METABOLIC PANEL WITH GFR
Anion gap: 11 (ref 5–15)
BUN: 29 mg/dL — ABNORMAL HIGH (ref 8–23)
CO2: 24 mmol/L (ref 22–32)
Calcium: 9.3 mg/dL (ref 8.9–10.3)
Chloride: 103 mmol/L (ref 98–111)
Creatinine, Ser: 0.97 mg/dL (ref 0.61–1.24)
GFR, Estimated: >60 mL/min (ref >60)
Glucose, Bld: 107 mg/dL — ABNORMAL HIGH (ref 70–99)
Potassium: 4.4 mmol/L (ref 3.5–5.1)
Sodium: 138 mmol/L (ref 135–145)

## 2024-06-29 LAB — CBC
HCT: 35.8 % — ABNORMAL LOW (ref 39.0–52.0)
Hemoglobin: 12.3 g/dL — ABNORMAL LOW (ref 13.0–17.0)
MCH: 31.2 pg (ref 26.0–34.0)
MCHC: 34.4 g/dL (ref 30.0–36.0)
MCV: 90.9 fL (ref 80.0–100.0)
Platelets: 154 K/uL (ref 150–400)
RBC: 3.94 MIL/uL — ABNORMAL LOW (ref 4.22–5.81)
RDW: 12.4 % (ref 11.5–15.5)
WBC: 7.1 K/uL (ref 4.0–10.5)
nRBC: 0 % (ref 0.0–0.2)

## 2024-06-29 LAB — SURGICAL PCR SCREEN
MRSA, PCR: NEGATIVE
Staphylococcus aureus: NEGATIVE

## 2024-06-29 LAB — TYPE AND SCREEN
ABO/RH(D): O POS
Antibody Screen: NEGATIVE

## 2024-06-30 ENCOUNTER — Encounter: Payer: Self-pay | Admitting: Family Medicine

## 2024-06-30 ENCOUNTER — Ambulatory Visit: Admitting: Family Medicine

## 2024-06-30 ENCOUNTER — Encounter: Payer: Self-pay | Admitting: Neurosurgery

## 2024-06-30 ENCOUNTER — Ambulatory Visit
Admission: RE | Admit: 2024-06-30 | Discharge: 2024-06-30 | Disposition: A | Discharge status: Home / Self Care | Source: Ambulatory Visit | Attending: Family Medicine | Admitting: Family Medicine

## 2024-06-30 VITALS — BP 130/80 | HR 66 | Temp 97.9°F | Resp 99 | Ht 66.14 in | Wt 196.6 lb

## 2024-06-30 DIAGNOSIS — I10 Essential (primary) hypertension: Secondary | ICD-10-CM

## 2024-06-30 DIAGNOSIS — E66811 Obesity, class 1: Secondary | ICD-10-CM | POA: Diagnosis not present

## 2024-06-30 DIAGNOSIS — R7303 Prediabetes: Secondary | ICD-10-CM

## 2024-06-30 DIAGNOSIS — Z01818 Encounter for other preprocedural examination: Secondary | ICD-10-CM

## 2024-06-30 DIAGNOSIS — M961 Postlaminectomy syndrome, not elsewhere classified: Secondary | ICD-10-CM | POA: Diagnosis not present

## 2024-06-30 DIAGNOSIS — Z950 Presence of cardiac pacemaker: Secondary | ICD-10-CM | POA: Diagnosis not present

## 2024-06-30 DIAGNOSIS — I517 Cardiomegaly: Secondary | ICD-10-CM | POA: Diagnosis not present

## 2024-06-30 LAB — POCT GLYCOSYLATED HEMOGLOBIN (HGB A1C): Hemoglobin A1C: 5.2 % (ref 4.0–5.6)

## 2024-06-30 MED ORDER — TIRZEPATIDE-WEIGHT MANAGEMENT 5 MG/0.5ML ~~LOC~~ SOLN: 5.0000 mg | SUBCUTANEOUS | 2 refills | Status: DC

## 2024-06-30 NOTE — Assessment & Plan Note (Addendum)
 A1c today actually in normal range.  Continue tirzepatide , hold perioperatively.

## 2024-06-30 NOTE — Progress Notes (Signed)
 Ph: [phone] Fax: [phone]   Patient ID: Randy Kennedy, male    DOB: 19-Sep-1950, 73 y.o.   MRN: [id number]  This visit was conducted in person.  BP 130/80 (BP Location: Left Arm, Patient Position: Sitting, Cuff Size: Large)   Pulse 66   Temp 97.9 F (36.6 C)   Resp (!) 99   Ht 5' 6.14 (1.68 m)   Wt 196 lb 9.6 oz (89.2 kg)   BMI 31.60 kg/m   BP Readings from Last 3 Encounters:  06/30/24 130/80  06/21/24 120/70  06/13/24 136/82   CC: preop eval  Subjective:   HPI: Randy Kennedy is a 73 y.o. 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, Carpal tunnel syndrome, Cataract (2015), Chest pain, Chronic fatigue, Chronic low back pain with bilateral sciatica, Chronic pain, Chronic radicular lumbar pain, Closed avulsion fracture of right talus (06/2016), COVID-19 (06/2020), DDD (degenerative disc disease), thoracic, Diastolic dysfunction (02/26/2022), Dyspnea, Eczema, Erectile dysfunction (09/14/2012), GERD (gastroesophageal reflux disease), Bertrum disease, Gout (2007), 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, History of bilateral cataract extraction (11/2020), History of chicken pox, History of hepatitis B, HLD (hyperlipidemia), HTN (hypertension), Leg edema, Lobar pneumonia (05/13/2022), Long-term use of aspirin  therapy, Morbid obesity (HCC), Motion sickness, Narcolepsy (2006), Obesity, OSA (obstructive sleep apnea) (11/2011 sleep study), Peripheral neuropathy, Pneumonia, Presence of cardiac pacemaker (02/27/2022), RBBB (right bundle branch block), RLS (restless legs syndrome), RSD  lower limb (12/14/2014), Sinus node dysfunction (HCC), Sleep apnea (2008), Spondylolisthesis of lumbar region, Systolic murmur, Thrombocytopenia, Wears dentures, and Wears hearing aid in both ears.  Planned upcoming thoracic laminectomy for [employer] spinal cord stimulator placement by Dr Clois  for spondylolisthesis and post-laminectomy syndrome. Was recommended to hold tirzepatide , but ok to continue aspirin  81mg  daily. He will not take this Sunday's tirzepatide  dose.   HTN, SSS, HLD - saw cardiology and received cardiac clearance from them on 06/21/2024. OSA - not using CPAP - anticipate improvement in this with recent weight loss.   Patient has tolerated anesthesia well in the past.  Latest surgical intervention was spinal cord stimulator trial 02/2024, prior to that CTR L 12/2023 then R 01/2024.  Denies trouble with post-op nausea/vomiting, or trouble awakening after surgery.   Denies chest pain, dyspnea, palpitations, leg swelling, HA, dizziness.  No fevers/chills, coughing, abd pain, diarrhea, or UTI symptoms.  Revised cardiac risk index score = 0  Planned high risk surgery (intraperitoneal, intrathoracic, or suprainguinal vascular): No History of ischemic heart disease: No History of congestive heart failure: No History of cerebrovascular disease: No History of preoperative insulin treatment: No History of kidney disease with creatinine >2 mg/dL: No       Relevant past medical, surgical, family and social history reviewed and updated as indicated. Interim medical history since our last visit reviewed. Allergies and medications reviewed and updated. Outpatient Medications Prior to Visit  Medication Sig Dispense Refill   aspirin  EC 81 MG tablet Take 81 mg by mouth daily.     carvedilol  (COREG ) 25 MG tablet Take 1 tablet (25 mg total) by mouth 2 (two) times daily with  a meal. 180 tablet 3   colchicine  0.6 MG tablet Take 1 tablet (0.6 mg total) by mouth daily as needed (gout flare).  30 tablet 3   fexofenadine (ALLEGRA) 180 MG tablet Take 180 mg by mouth every morning.     fluticasone  (FLONASE ) 50 MCG/ACT nasal spray SPRAY 2 SPRAYS INTO EACH NOSTRIL EVERY DAY 48 mL 3   gabapentin  (NEURONTIN ) 300 MG capsule Take 1 capsule (300 mg total) by mouth 2 (two) times daily AND 2-3 capsules (600-900 mg total) at bedtime. 480 capsule 3   hydrALAZINE  (APRESOLINE ) 100 MG tablet Take 1 tablet (100 mg total) by mouth 3 (three) times daily. 270 tablet 3   minoxidil  (LONITEN ) 2.5 MG tablet TAKE 1 TABLET BY MOUTH DAILY AT BEDTIME 90 tablet 2   montelukast  (SINGULAIR ) 10 MG tablet Take 1 tablet (10 mg total) by mouth at bedtime. 90 tablet 3   nitroGLYCERIN  (NITROSTAT ) 0.4 MG SL tablet Place 1 tablet (0.4 mg total) under the tongue every 5 (five) minutes as needed for chest pain. 25 tablet 4   pravastatin  (PRAVACHOL ) 40 MG tablet Take 1 tablet (40 mg total) by mouth daily. 90 tablet 3   probenecid  (BENEMID ) 500 MG tablet TAKE 1/2 TABLET TWICE A DAY BY MOUTH 90 tablet 3   valsartan  (DIOVAN ) 320 MG tablet Take 1 tablet (320 mg total) by mouth daily. 90 tablet 3   vitamin C (ASCORBIC ACID) 500 MG tablet Take 500 mg by mouth daily.     tirzepatide  5 MG/0.5ML injection vial Inject 5 mg into the skin once a week. 2 mL 1   No facility-administered medications prior to visit.     Per HPI unless specifically indicated in ROS section below Review of Systems  Objective:  BP 130/80 (BP Location: Left Arm, Patient Position: Sitting, Cuff Size: Large)   Pulse 66   Temp 97.9 F (36.6 C)   Resp (!) 99   Ht 5' 6.14 (1.68 m)   Wt 196 lb 9.6 oz (89.2 kg)   BMI 31.60 kg/m   Wt Readings from Last 3 Encounters:  06/30/24 196 lb 9.6 oz (89.2 kg)  06/21/24 199 lb 2 oz (90.3 kg)  06/13/24 200 lb 4 oz (90.8 kg)      Physical Exam Vitals and nursing note reviewed.  Constitutional:      Appearance: Normal appearance. He is not ill-appearing.  HENT:     Head: Normocephalic and atraumatic.      Mouth/Throat:     Mouth: Mucous membranes are moist.     Pharynx: Oropharynx is clear. No oropharyngeal exudate or posterior oropharyngeal erythema.  Eyes:     Extraocular Movements: Extraocular movements intact.     Conjunctiva/sclera: Conjunctivae normal.     Pupils: Pupils are equal, round, and reactive to light.  Cardiovascular:     Rate and Rhythm: Normal rate and regular rhythm.     Pulses: Normal pulses.     Heart sounds: Normal heart sounds. No murmur heard. Pulmonary:     Effort: Pulmonary effort is normal. No respiratory distress.     Breath sounds: Normal breath sounds. No wheezing, rhonchi or rales.  Musculoskeletal:     Cervical back: Normal range of motion and neck supple.     Right lower leg: No edema.     Left lower leg: No edema.  Skin:    General: Skin is warm and dry.     Findings: No rash.  Neurological:     Mental Status: He is  alert.  Psychiatric:        Mood and Affect: Mood normal.        Behavior: Behavior normal.       Results for orders placed or performed in visit on 06/30/24  HgB A1c   Collection Time: 06/30/24  9:04 AM  Result Value Ref Range   Hemoglobin A1C 5.2 4.0 - 5.6 %   HbA1c POC (<> result, manual entry)     HbA1c, POC (prediabetic range)     HbA1c, POC (controlled diabetic range)     Lab Results  Component Value Date   NA 138 06/29/2024   CL 103 06/29/2024   K 4.4 06/29/2024   CO2 24 06/29/2024   BUN 29 (H) 06/29/2024   CREATININE 0.97 06/29/2024   GFRNONAA >60 06/29/2024   CALCIUM 9.3 06/29/2024   ALBUMIN 4.3 01/31/2024   GLUCOSE 107 (H) 06/29/2024    Lab Results  Component Value Date   WBC 7.1 06/29/2024   HGB 12.3 (L) 06/29/2024   HCT 35.8 (L) 06/29/2024   MCV 90.9 06/29/2024   PLT 154 06/29/2024   Lab Results  Component Value Date   LABURIC 7.1 01/31/2024    Assessment & Plan:  Flu shot declined.   Problem List Items Addressed This Visit     HTN (hypertension)   Chronic, stable on current regimen of  carvedilol  25mg  bid, hydralazine  100mg  tid, minoxidil  2.5mg  nightly and valsartan  320mg  daily - will continue to monitor in setting of weight loss       Obesity, Class I, BMI 30-34.9   Congratulated on continued weight loss to date on zepbound  5mg  weekly started 01/2024, tolerating well. Hold for upcoming procedure then continue 5mg  weekly.       Status post placement of cardiac pacemaker   S/p PM placement for SSS with symptomatic bradycardia 02/2022      Pre-op evaluation - Primary   RCRI = 0 Cardiac clearance already done through cardiology last month.  He received preop eval earlier this week, labs reviewed - overall stable.  Will update A1c today (h/o prediabetes) as well as update CXR.  Anticipate adequately low risk to proceed with planned spinal procedure.  He knows to hold tirzepatide  this weekend, has been ok'd to continue aspirin  perioperatively.  Will forward today's note attn Dr Clois neurosurgery.      Relevant Orders   DG Chest 2 View   HgB A1c (Completed)   Prediabetes   A1c today actually in normal range.  Continue tirzepatide , hold perioperatively.       Morbid obesity (HCC)   Baseline BMI 37.8 (01/2024)      Relevant Medications   tirzepatide  5 MG/0.5ML injection vial   Lumbar post-laminectomy syndrome     Meds ordered this encounter  Medications   tirzepatide  5 MG/0.5ML injection vial    Sig: Inject 5 mg into the skin once a week.    Dispense:  2 mL    Refill:  2    Orders Placed This Encounter  Procedures   DG Chest 2 View    Standing Status:   Future    Number of Occurrences:   1    Expiration Date:   06/30/2025    Reason for Exam (SYMPTOM  OR DIAGNOSIS REQUIRED):   preop eval    Preferred imaging location?:   [place]   HgB A1c    Patient Instructions  Chest xray today.  A1c today Good to see you today We will forward  results attention neurosurgery Dr Katrina Congratulations on continued weight loss to date!  Return  in 3 months for weight check  Follow up plan: Return in about 3 months (around 09/28/2024), or if symptoms worsen or fail to improve, for follow up visit.  Anton Blas, MD

## 2024-06-30 NOTE — Assessment & Plan Note (Signed)
 RCRI = 0 Cardiac clearance already done through cardiology last month.  He received preop eval earlier this week, labs reviewed - overall stable.  Will update A1c today (h/o prediabetes) as well as update CXR.  Anticipate adequately low risk to proceed with planned spinal procedure.  He knows to hold tirzepatide  this weekend, has been ok'd to continue aspirin  perioperatively.  Will forward today's note attn Dr Clois neurosurgery.

## 2024-06-30 NOTE — Assessment & Plan Note (Addendum)
 Congratulated on continued weight loss to date on zepbound  5mg  weekly started 01/2024, tolerating well. Hold for upcoming procedure then continue 5mg  weekly.

## 2024-06-30 NOTE — Assessment & Plan Note (Addendum)
 S/p PM placement for SSS with symptomatic bradycardia 02/2022

## 2024-06-30 NOTE — Patient Instructions (Addendum)
 Chest xray today.  A1c today Good to see you today We will forward results attention neurosurgery Dr Katrina Congratulations on continued weight loss to date!  Return in 3 months for weight check

## 2024-06-30 NOTE — Assessment & Plan Note (Signed)
 Chronic, stable on current regimen of carvedilol  25mg  bid, hydralazine  100mg  tid, minoxidil  2.5mg  nightly and valsartan  320mg  daily - will continue to monitor in setting of weight loss

## 2024-06-30 NOTE — Progress Notes (Signed)
 Perioperative / Anesthesia Services  Pre-Admission Testing Clinical Review / Pre-Operative Anesthesia Consult  Date: 06/30/24  PATIENT DEMOGRAPHICS: Name: Randy Kennedy DOB: [birth year] MRN:   [id number]  Note: Available PAT nursing documentation and vital signs have been reviewed. Clinical nursing staff has updated patient's PMH/PSHx, current medication list, and drug allergies/intolerances to ensure complete and comprehensive history available to assist care teams in MDM as it pertains to the aforementioned surgical procedure and anticipated anesthetic course. Extensive review of available clinical information personally performed. [place] [facility] and PSHx updated with any diagnoses/procedures that  may have been inadvertently omitted during his intake with the pre-admission testing department's nursing staff.  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 male who is submitted for pre-surgical anesthesia review and clearance prior to him undergoing the above procedure.  Patient is a Former Smoker (4 pack years; quit 07/1978). 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   Patient is followed by cardiology (Gollan, MD). He was last seen in the cardiology clinic on 06/21/2024; notes reviewed. At the time of his clinic visit, patient doing well overall from a cardiovascular perspective. Patient with chronic exertional dyspnea that was reported to be stable and at baseline. Patient denied any chest pain, PND, orthopnea, palpitations, significant peripheral edema, weakness, fatigue, vertiginous symptoms, or presyncope/syncope. Patient with a past medical history significant for cardiovascular diagnoses. Documented physical exam was grossly benign, providing no evidence of acute exacerbation and/or decompensation of the patient's known cardiovascular conditions. Of note, complete cardiovascular history unavailable for review at time of consult. Patient has received care in [place] . Information gathered from patient report and from notes provided by his local cardiologist.   Patient underwent myocardial perfusion imaging study on 12/11/2011.  Study demonstrated normal left ventricular systolic function with an EF of 55%.  There was no evidence of stress-induced myocardial ischemia or arrhythmia; no scintigraphic evidence of scar.  Study determined to be normal and low risk.   Most recent TTE was performed on 02/26/2022 revealing a normal left ventricular systolic function with an EF of 60-65%.  There were no regional wall motion abnormalities. Left ventricular diastolic Doppler parameters consistent with pseudonormalization (G2DD).  Right ventricular size was moderately enlarged with normal function.  PASP normal at 32.2 mmHg.  There was moderate left and severe right atrial enlargement.  There was trivial aortic valve regurgitation. All transvalvular gradients were noted to be normal providing no evidence suggestive of valvular stenosis.   Patient with symptomatic bradycardia secondary to  sinoatrial node dysfunction.  He is status post placement of an Abbott Assurity MRI dual-chamber pacemaker on 02/27/2022.  Device is regularly interrogated by his primary cardiology team.  Last interrogation was on 06/08/2024, at which time device  was noted to be functioning properly. Cardiology indicating that patient is pacemaker dependent.   Blood pressure elevated well controlled at 120/70 mmHg on currently prescribed beta blocker (carvedilol ), vasodilator (hydralazine  + minoxidil ), and ARB (valsartan ) therapies.  Patient has a supply of short acting nitrates (NTG) to use on a as needed basis for recurrent angina/anginal equivalent symptoms; denied recent use.  Patient is on pravastatin  for his HLD diagnosis and ASCVD prevention. Patient is not diabetic. He does have an OSAH diagnosis, however is noncompliant with prescribed nocturnal PAP therapy. Functional capacity somewhat limited by back pain. With that said, patient is able to complete all of his  ADL/IADLs without cardiovascular limitation.  Per the DASI, patient is able to achieve at least 4 METS of physical activity without experiencing any significant degree of angina/anginal equivalent symptoms. No changes were made to his medication regimen during his visit with cardiology. Patient scheduled to follow-up with outpatient cardiology in 6 months or sooner if needed.  Randy Kennedy is scheduled for an elective THORACIC LAMINECTOMY FOR SPINAL CORD STIMULATOR on 07/05/2024 with Dr. Reeves Daisy, MD.  Given patient's past medical history significant for cardiovascular diagnoses, presurgical cardiac clearance was sought by the PAT team. Per cardiology, he is doing well from a cardiac standpoint without angina symptoms. Stable chronic SOB. Per Duke Activity Status Index, he can achieve greater than 4 METs without cardiac limitation. Main limitation is due to chronic pain. Per Revised Cardiac Risk Index, he has low risk for noncardiac surgery  with an estimated rate of 4% for adverse cardiac event in the periprocedural timeframe No further cardiac testing indicated.  He may proceed with noncardiac surgery at an overall LOW risk without further cardiac testing.  In review of the patient's chart, it is noted that he is on daily oral antithrombotic therapy. Given that patient's past medical history is significant for cardiovascular diagnoses, including but not limited to CAD, neurosurgery has cleared patient to continue his daily low dose ASA throughout his perioperative course. He will be asked to hold his normal dose on the day of his procedure only. Patient has been updated on these directives from his specialty care providers by the PAT team.  Patient denies previous perioperative complications with anesthesia in the past. In review his EMR, it is noted that patient underwent a general anesthetic course here at [facility] ([facility]) in 01/2024 without documented complications.   MOST RECENT VITAL SIGNS:    06/30/2024    8:35 AM 06/21/2024    8:35 AM 06/13/2024   10:31 AM  Vitals with BMI  Height 5' 6.142 5' 7   Weight 196 lbs 10 oz 199 lbs 2 oz   BMI 31.6 31.18   Systolic 130 120 863  Diastolic 80 70 82  Pulse 66 60    PROVIDERS/SPECIALISTS: NOTE: Primary physician provider listed below. Patient may have been seen by APP or partner within same practice.   PROVIDER ROLE / SPECIALTY LAST [facility]ERLEAN Daisy Reeves, MD Neurosurgery (Surgeon) 06/13/2024  Rilla Baller, MD Primary Care Provider 06/30/2024  Perla Lye, MD Cardiology 06/21/2024  Cindie Smalls, MD Electrophysiology 01/17/2024  Marcelino Nurse, MD Pain Management 05/24/2024   ALLERGIES: Allergies  Allergen Reactions   Amlodipine  Rash    Rash in groin   Allopurinol Other (See Comments)    GI upset   Lisinopril -Hydrochlorothiazide      Burning in feet.   Procardia  [Nifedipine ] Other (See Comments)    Bradycardia, leg  swelling  Clonidine  Derivatives Rash   Doxazosin  Mesylate Rash   Latex Rash    Tapes, paper tape okay    Nortriptyline  Rash   Uloric  [Febuxostat ] Rash    CURRENT HOME MEDICATIONS: No current facility-administered medications for this encounter.    aspirin  EC 81 MG tablet   carvedilol  (COREG ) 25 MG tablet   colchicine  0.6 MG tablet   fexofenadine (ALLEGRA) 180 MG tablet   fluticasone  (FLONASE ) 50 MCG/ACT nasal spray   gabapentin  (NEURONTIN ) 300 MG capsule   hydrALAZINE  (APRESOLINE ) 100 MG tablet   minoxidil  (LONITEN ) 2.5 MG tablet   montelukast  (SINGULAIR ) 10 MG tablet   nitroGLYCERIN  (NITROSTAT ) 0.4 MG SL tablet   pravastatin  (PRAVACHOL ) 40 MG tablet   probenecid  (BENEMID ) 500 MG tablet   tirzepatide  5 MG/0.5ML injection vial   valsartan  (DIOVAN ) 320 MG tablet   vitamin C (ASCORBIC ACID) 500 MG tablet   HISTORY: Past Medical History:  Diagnosis Date   Allergic rhinitis    Anginal pain    Arthritis    Bradycardia    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 11/2011 normal stress test performed in [place]  - see chart.   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 moderate, AHI 37.4 increased to 80 in REM, on BiPAP 14/10, 97% compliance >4 hrs (06/2013); b. Does not tolerate CPAP.   Peripheral neuropathy    Pneumonia    Presence of cardiac pacemaker 02/27/2022   a.) Abbott Assurity MRI dual-chamber pacemaker (SN: [id number] )   RBBB (right bundle branch block)    RLS (restless legs syndrome)    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   Reviewed prior workup (saw pain management at [facility], [place]): chronic back pain with RLS + RSD L leg with severe L post-traumatic ankle joint arthosis, no mention of peripheral neuropathy. Treated with vicodin, prior tried fentanyl  and butrans.   Sinus node dysfunction (HCC)    a.) s/p Abbott Assurity MRI dual-chamber pacemaker placement 02/27/2022   Sleep apnea 2008  Spondylolisthesis of lumbar region    Systolic murmur    Thrombocytopenia    Wears dentures    Partial lower   Wears hearing aid in both ears    Does not wear all the time   Past Surgical History:  Procedure Laterality Date   APPLICATION OF INTRAOPERATIVE CT SCAN N/A 11/27/2022   Procedure: APPLICATION OF INTRAOPERATIVE CT SCAN;  Surgeon: Clois Fret, MD;  Location: [facility] ORS;  Service: Neurosurgery;  Laterality: N/A;   CARPAL TUNNEL RELEASE Left 01/18/2024   Procedure: CARPAL TUNNEL RELEASE;  Surgeon: Claudene Penne ORN, MD;  Location: [facility] ORS;  Service: Neurosurgery;  Laterality: Left;  MIS LEFT CARPAL TUNNELTUNNEL RELEASE WITH ULTRASOUND, POSSIBLE CONVERTION TO OPEN   CARPAL TUNNEL RELEASE Right 02/24/2024   Procedure: CARPAL TUNNEL RELEASE;  Surgeon: Claudene Penne ORN,  MD;  Location: [facility] ORS;  Service: Neurosurgery;  Laterality: Right;  RIGHT CARPAL TUNNEL RELEASE WITH ULTRASOUND GUIDANCE   CATARACT EXTRACTION W/PHACO Right 11/26/2020   Procedure: CATARACT EXTRACTION PHACO AND INTRAOCULAR LENS PLACEMENT (IOC) RIGHT VIVITY TORIC LENS 7.17 00:49.6;  Surgeon: Jaye Fallow, MD;  Location: [facility];  Service: OphthalmologY   CATARACT EXTRACTION W/PHACO Left 12/10/2020   Procedure: CATARACT EXTRACTION PHACO AND INTRAOCULAR LENS PLACEMENT (IOC) LEFT VIVITY;  Surgeon: Jaye Fallow, MD;  Location: [facility];  Service: Ophthalmology; LENS 5.3700:32.7   COLONOSCOPY  02/2011   ext hem, benign polyp, rpt 5 yrs ([place] )   COLONOSCOPY WITH PROPOFOL  N/A 12/17/2020   multiple TA, ext hem, rpt 3 yrs Caprice Corinn Skiff, MD)   EYE SURGERY  2024   FRACTURE SURGERY  1985   INSERT / REPLACE / REMOVE PACEMAKER  Sept 2023   LEG SURGERY  x5   left - after fall at work (3.5 stories)   MANDIBLE SURGERY  1965   jaw fracture - horse kick   MOHS SURGERY Right 07/03/2021   Ear   OPERATIVE ULTRASOUND Right 02/24/2024   Procedure: US  INTRAOPERATIVE;  Surgeon: Claudene Penne ORN, MD;  Location: [facility] ORS;  Service: Neurosurgery;  Laterality: Right;   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   Procedure: PACEMAKER IMPLANT;  Surgeon: Inocencio Soyla Lunger, MD;  [facility];  Service: Cardiovascular;  Laterality: N/A;   SPINE SURGERY  2024   TRANSFORAMINAL LUMBAR INTERBODY FUSION W/ MIS 1 LEVEL N/A 11/27/2022   Procedure: L4-5 MINIMALLY INVASIVE (MIS) TRANSFORAMINAL LUMBAR INTERBODY FUSION (TLIF);  Surgeon: Clois Fret, MD;  Location: [facility] ORS;  Service: Neurosurgery;  Laterality: N/A;   TYMPANIC MEMBRANE REPAIR Left 1994   otosclerosis   Family History  Problem Relation Age of Onset   Colon cancer Mother    Cancer Mother        rectal   Hypertension Mother    Cancer Father 64       prostate   Hypertension Father    Hypertension Sister     Heart attack Maternal Aunt        [place]   Obesity Maternal Aunt    Cancer Paternal Uncle        prostate   Diabetes Neg Hx    Stroke Neg Hx    CAD Neg Hx    Rectal cancer Neg Hx    Stomach cancer Neg Hx    Esophageal cancer Neg Hx    Social History   Tobacco Use   Smoking status: Former    Current packs/day: 0.00    Average packs/day: 1 pack/day for 4.0 years (4.0 ttl pk-yrs)  Types: Pipe, Cigarettes    Start date: 07/27/1974    Quit date: 07/27/1978    Years since quitting: 45.9   Smokeless tobacco: Never  Substance Use Topics   Alcohol use: No    Alcohol/week: 0.0 standard drinks of alcohol   LABS:   Office Visit on 06/30/2024  Component Date Value Ref Range Status   Hemoglobin A1C 06/30/2024 5.2  4.0 - 5.6 % Final  Hospital Outpatient Visit on 06/29/2024  Component Date Value Ref Range Status   MRSA, PCR 06/29/2024 NEGATIVE  NEGATIVE Final   Staphylococcus aureus 06/29/2024 NEGATIVE  NEGATIVE Final   Comment: (NOTE) The Xpert SA Assay (FDA approved for NASAL specimens in patients 32 years of age and older), is one component of a comprehensive surveillance program. It is not intended to diagnose infection nor to guide or monitor treatment. Performed at [facility], [address] [place], [place] [address]    WBC 06/29/2024 7.1  4.0 - 10.5 K/uL Final   RBC 06/29/2024 3.94 (L)  4.22 - 5.81 MIL/uL Final   Hemoglobin 06/29/2024 12.3 (L)  13.0 - 17.0 g/dL Final   HCT 87/95/7974 35.8 (L)  39.0 - 52.0 % Final   MCV 06/29/2024 90.9  80.0 - 100.0 fL Final   MCH 06/29/2024 31.2  26.0 - 34.0 pg Final   MCHC 06/29/2024 34.4  30.0 - 36.0 g/dL Final   RDW 87/95/7974 12.4  11.5 - 15.5 % Final   Platelets 06/29/2024 154  150 - 400 K/uL Final   nRBC 06/29/2024 0.0  0.0 - 0.2 % Final   Performed at [facility], [address] [place], [place] [address]   Sodium 06/29/2024 138  135 - 145 mmol/L Final   Potassium 06/29/2024 4.4  3.5 - 5.1 mmol/L Final    Chloride 06/29/2024 103  98 - 111 mmol/L Final   CO2 06/29/2024 24  22 - 32 mmol/L Final   Glucose, Bld 06/29/2024 107 (H)  70 - 99 mg/dL Final   Glucose reference range applies only to samples taken after fasting for at least 8 hours.   BUN 06/29/2024 29 (H)  8 - 23 mg/dL Final   Creatinine, Ser 06/29/2024 0.97  0.61 - 1.24 mg/dL Final   Calcium 87/95/7974 9.3  8.9 - 10.3 mg/dL Final   GFR, Estimated 06/29/2024 >60  >60 mL/min Final   Comment: (NOTE) Calculated using the CKD-EPI Creatinine Equation (2021)    Anion gap 06/29/2024 11  5 - 15 Final   Performed at [facility], [address] [place], [place] [address]   ABO/RH(D) 06/29/2024 O POS   Final   Antibody Screen 06/29/2024 NEG   Final   Sample Expiration 06/29/2024 07/13/2024,2359   Final   Extend sample reason 06/29/2024    Final                   Value:NO TRANSFUSIONS OR PREGNANCY IN THE PAST 3 MONTHS Performed at [facility], [address] [place], [place] [address]     ECG: Date: 06/21/2024 Time ECG obtained: 0844 AM Rate: 60 bpm Rhythm: Atrial paced rhythm with prolonged AV conduction Axis (leads I and aVF): normal Intervals: PR 234 ms. QRS 160 ms. QTc 435 ms. ST segment and T wave changes: No significant ST or T wave changes Evidence of a possible, age undetermined, prior infarct:  No Comparison: Similar to previous tracing obtained on 02/032025   IMAGING / PROCEDURES: MR THORACIC SPINE WO CONTRAST performed  on 03/24/2024 Alignment: Normal Bone marrow signal: No significant abnormality Thoracic spinal cord: Normal Facet joints: No significant abnormality Intervertebral discs: No disc herniation. There is a mild disc bulge at T12-L1. Mild degenerative disc disease at T7-T8 Paraspinal tissues: No significant abnormality   CT LUMBAR SPINE WO CONTRAST performed on 12/24/2023 Status post left facetectomy, discectomy and fusion at L4-5. The central canal and foramina appear open at this  level. Hardware is intact. No evidence of loosening or other complicating feature. Moderate to moderately severe compression of the thecal sac at L3-4 by disc and epidural fat. Mild right foraminal narrowing also noted at this level. Mild central canal and right foraminal narrowing at L2-3. Aortic atherosclerosis  MR LUMBAR SPINE WO CONTRAST performed on 11/18/2023 Operative changes of anterior and posterior fusion at L4-L5. Moderate to severe canal stenoses at L3-L4 and L4-L5 secondary to disc bulging and facet arthropathy. Moderate foraminal stenoses bilaterally at L4-L5.  MR CERVICAL SPINE WO CONTRAST performed on 11/18/2023 Mild canal stenosis at C3-C4, C5-C6 and C6-C7. Severe bilateral foraminal stenosis at C6-C7.  Moderate foraminal stenosis on the left at C3-C4 and bilaterally at C5-C6.  DIAGNOSTIC RADIOGRAPHS OF CHEST 2 VIEWS performed on 11/16/2022 There is stable dual lead AICD positioning.  The cardiac silhouette is mildly enlarged and unchanged in size.  There is mild, stable prominence of the perihilar pulmonary vasculature without overt edema.  There is no evidence of an acute infiltrate, pleural effusion or pneumothorax.  The visualized skeletal structures are unremarkable.   TRANSTHORACIC ECHOCARDIOGRAM performed on 02/26/2022 Left ventricular ejection fraction, by estimation, is 60 to 65%. The left ventricle has normal function. The left ventricle has no regional wall motion abnormalities. Left ventricular diastolic parameters are consistent with Grade II diastolic dysfunction (pseudonormalization).  Right ventricular systolic function is normal. The right ventricular size is moderately enlarged. There is normal pulmonary artery systolic pressure.  Left atrial size was moderately dilated.  Right atrial size was severely dilated.  The mitral valve is normal in structure. No evidence of mitral valve regurgitation. No evidence of mitral stenosis.  The aortic valve is  tricuspid. Aortic valve regurgitation is trivial. Aortic valve sclerosis/calcification is present, without any evidence of  aortic stenosis.  The inferior vena cava is normal in size with greater than 50% respiratory variability, suggesting right atrial pressure of 3 mmHg.   IMPRESSION AND PLAN: Randy Kennedy has been referred for pre-anesthesia review and clearance prior to him undergoing the planned anesthetic and procedural courses. Available labs, pertinent testing, and imaging results were personally reviewed by me in preparation for upcoming operative/procedural course. [facility] medical record has been updated following extensive record review and patient interview with PAT staff.   This patient has been appropriately cleared by cardiology with an overall LOW risk of patient experiencing significant perioperative cardiovascular complications. Electrophysiology indicating that procedure may interfere with planned surgical procedure and are therefore recommending magnet placement over the device during the procedure.  Magnet should be removed post procedurally, which in turn will allow for the device to revert to previously programmed settings without the need for reprogramming.  Beyond normal perioperative cardiovascular monitoring, and the aforementioned magnet placement, there are no recommendations from his electrophysiology team that would prompt further discussions/recommendations from an industry representative.   Based on clinical review performed today (06/30/24), barring any significant acute changes in the patient's overall condition, it is anticipated that he will be able to proceed with the planned surgical intervention. Any acute changes in clinical  condition may necessitate his procedure being postponed and/or cancelled. Patient will meet with anesthesia team (MD and/or CRNA) on the day of his procedure for preoperative evaluation/assessment. Questions regarding anesthetic course  will be fielded at that time.   Pre-surgical instructions were reviewed with the patient during his PAT appointment, and questions were fielded to satisfaction by PAT clinical staff. He has been instructed on which medications that he will need to hold prior to surgery, as well as the ones that have been deemed safe/appropriate to take on the day of his procedure. As part of the general education provided by PAT, patient made aware both verbally and in writing, that he would need to abstain from the use of any illegal substances during his perioperative course. He was advised that failure to follow the provided instructions could necessitate case cancellation or result in serious perioperative complications up to and including death. Patient encouraged to contact PAT and/or his surgeon's office to discuss any questions or concerns that may arise prior to surgery; verbalized understanding.   Dorise Pereyra, MSN, APRN, FNP-C, CEN [facility]  Perioperative Services Nurse Practitioner Phone: [phone] Fax: [phone] 06/30/24 12:34 PM  NOTE: This note has been prepared using Dragon dictation software. Despite my best ability to proofread, there is always the potential that unintentional transcriptional errors may still occur from this process.

## 2024-06-30 NOTE — Assessment & Plan Note (Signed)
 Baseline BMI 37.8 (01/2024)

## 2024-07-01 ENCOUNTER — Ambulatory Visit: Payer: Self-pay | Admitting: Family Medicine

## 2024-07-05 ENCOUNTER — Other Ambulatory Visit: Payer: Self-pay

## 2024-07-05 ENCOUNTER — Encounter: Payer: Self-pay | Admitting: Urgent Care

## 2024-07-05 ENCOUNTER — Ambulatory Visit

## 2024-07-05 ENCOUNTER — Encounter: Payer: Self-pay | Admitting: Neurosurgery

## 2024-07-05 ENCOUNTER — Encounter: Admission: RE | Disposition: A | Payer: Self-pay | Source: Home / Self Care | Attending: Neurosurgery

## 2024-07-05 ENCOUNTER — Ambulatory Visit
Admission: RE | Admit: 2024-07-05 | Discharge: 2024-07-05 | Disposition: A | Attending: Neurosurgery | Admitting: Neurosurgery

## 2024-07-05 ENCOUNTER — Ambulatory Visit: Payer: Self-pay | Admitting: Urgent Care

## 2024-07-05 DIAGNOSIS — I10 Essential (primary) hypertension: Secondary | ICD-10-CM

## 2024-07-05 DIAGNOSIS — G4733 Obstructive sleep apnea (adult) (pediatric): Secondary | ICD-10-CM | POA: Insufficient documentation

## 2024-07-05 DIAGNOSIS — M1990 Unspecified osteoarthritis, unspecified site: Secondary | ICD-10-CM | POA: Insufficient documentation

## 2024-07-05 DIAGNOSIS — M4316 Spondylolisthesis, lumbar region: Secondary | ICD-10-CM

## 2024-07-05 DIAGNOSIS — I209 Angina pectoris, unspecified: Secondary | ICD-10-CM | POA: Diagnosis not present

## 2024-07-05 DIAGNOSIS — Z950 Presence of cardiac pacemaker: Secondary | ICD-10-CM | POA: Insufficient documentation

## 2024-07-05 DIAGNOSIS — M961 Postlaminectomy syndrome, not elsewhere classified: Secondary | ICD-10-CM | POA: Diagnosis present

## 2024-07-05 DIAGNOSIS — G8929 Other chronic pain: Secondary | ICD-10-CM | POA: Diagnosis not present

## 2024-07-05 DIAGNOSIS — I495 Sick sinus syndrome: Secondary | ICD-10-CM | POA: Diagnosis not present

## 2024-07-05 DIAGNOSIS — M109 Gout, unspecified: Secondary | ICD-10-CM | POA: Diagnosis not present

## 2024-07-05 DIAGNOSIS — Z79899 Other long term (current) drug therapy: Secondary | ICD-10-CM | POA: Insufficient documentation

## 2024-07-05 DIAGNOSIS — Z6830 Body mass index (BMI) 30.0-30.9, adult: Secondary | ICD-10-CM | POA: Insufficient documentation

## 2024-07-05 DIAGNOSIS — E785 Hyperlipidemia, unspecified: Secondary | ICD-10-CM | POA: Insufficient documentation

## 2024-07-05 DIAGNOSIS — Z01818 Encounter for other preprocedural examination: Secondary | ICD-10-CM

## 2024-07-05 DIAGNOSIS — E669 Obesity, unspecified: Secondary | ICD-10-CM | POA: Diagnosis not present

## 2024-07-05 DIAGNOSIS — Z87891 Personal history of nicotine dependence: Secondary | ICD-10-CM | POA: Diagnosis not present

## 2024-07-05 DIAGNOSIS — Z91199 Patient's noncompliance with other medical treatment and regimen due to unspecified reason: Secondary | ICD-10-CM | POA: Diagnosis not present

## 2024-07-05 HISTORY — DX: Other intervertebral disc degeneration, thoracic region: M51.34

## 2024-07-05 HISTORY — PX: THORACIC LAMINECTOMY FOR SPINAL CORD STIMULATOR: SHX6887

## 2024-07-05 SURGERY — THORACIC LAMINECTOMY FOR SPINAL CORD STIMULATOR
Anesthesia: General

## 2024-07-05 MED ORDER — SURGIFLO WITH THROMBIN (HEMOSTATIC MATRIX KIT) OPTIME
TOPICAL | Status: DC | PRN
  Administered 2024-07-05: 1 via TOPICAL

## 2024-07-05 MED ORDER — ESMOLOL HCL 100 MG/10ML IV SOLN
INTRAVENOUS | Status: AC
  Filled 2024-07-05: qty 10

## 2024-07-05 MED ORDER — PHENYLEPHRINE HCL-NACL 20-0.9 MG/250ML-% IV SOLN
INTRAVENOUS | Status: AC
  Filled 2024-07-05: qty 250

## 2024-07-05 MED ORDER — DEXAMETHASONE SOD PHOSPHATE PF 10 MG/ML IJ SOLN
INTRAMUSCULAR | Status: DC | PRN
  Administered 2024-07-05: 10 mg via INTRAVENOUS

## 2024-07-05 MED ORDER — PROPOFOL 1000 MG/100ML IV EMUL
INTRAVENOUS | Status: AC
  Filled 2024-07-05: qty 100

## 2024-07-05 MED ORDER — REMIFENTANIL HCL 1 MG IV SOLR
INTRAVENOUS | Status: AC
  Filled 2024-07-05: qty 1000

## 2024-07-05 MED ORDER — OXYCODONE HCL 5 MG PO TABS
5.0000 mg | ORAL_TABLET | Freq: Once | ORAL | Status: AC | PRN
  Administered 2024-07-05: 5 mg via ORAL

## 2024-07-05 MED ORDER — LIDOCAINE HCL (CARDIAC) PF 100 MG/5ML IV SOSY
PREFILLED_SYRINGE | INTRAVENOUS | Status: DC | PRN
  Administered 2024-07-05: 100 mg via INTRAVENOUS

## 2024-07-05 MED ORDER — PHENYLEPHRINE HCL-NACL 20-0.9 MG/250ML-% IV SOLN
INTRAVENOUS | Status: DC | PRN
  Administered 2024-07-05: 25 ug/min via INTRAVENOUS

## 2024-07-05 MED ORDER — SUCCINYLCHOLINE CHLORIDE 200 MG/10ML IV SOSY
PREFILLED_SYRINGE | INTRAVENOUS | Status: DC | PRN
  Administered 2024-07-05: 100 mg via INTRAVENOUS

## 2024-07-05 MED ORDER — SODIUM CHLORIDE (PF) 0.9 % IJ SOLN
INTRAMUSCULAR | Status: DC | PRN
  Administered 2024-07-05: 60 mL via INTRAMUSCULAR

## 2024-07-05 MED ORDER — BUPIVACAINE HCL (PF) 0.5 % IJ SOLN
INTRAMUSCULAR | Status: AC
  Filled 2024-07-05: qty 30

## 2024-07-05 MED ORDER — SEVOFLURANE IN SOLN
RESPIRATORY_TRACT | Status: AC
  Filled 2024-07-05: qty 250

## 2024-07-05 MED ORDER — ONDANSETRON HCL 4 MG/2ML IJ SOLN
INTRAMUSCULAR | Status: DC | PRN
  Administered 2024-07-05: 4 mg via INTRAVENOUS

## 2024-07-05 MED ORDER — IRRISEPT - 450ML BOTTLE WITH 0.05% CHG IN STERILE WATER, USP 99.95% OPTIME
TOPICAL | Status: DC | PRN
  Administered 2024-07-05: 450 mL via TOPICAL

## 2024-07-05 MED ORDER — FENTANYL CITRATE (PF) 100 MCG/2ML IJ SOLN
25.0000 ug | INTRAMUSCULAR | Status: DC | PRN
  Administered 2024-07-05 (×2): 50 ug via INTRAVENOUS

## 2024-07-05 MED ORDER — CHLORHEXIDINE GLUCONATE 0.12 % MT SOLN
15.0000 mL | Freq: Once | OROMUCOSAL | Status: AC
  Administered 2024-07-05: 15 mL via OROMUCOSAL

## 2024-07-05 MED ORDER — CEFAZOLIN SODIUM-DEXTROSE 2-4 GM/100ML-% IV SOLN
2.0000 g | Freq: Once | INTRAVENOUS | Status: AC
  Administered 2024-07-05: 2 g via INTRAVENOUS

## 2024-07-05 MED ORDER — ORAL CARE MOUTH RINSE: 15.0000 mL | Freq: Once | OROMUCOSAL | Status: AC

## 2024-07-05 MED ORDER — ONDANSETRON HCL 4 MG/2ML IJ SOLN
INTRAMUSCULAR | Status: AC
  Filled 2024-07-05: qty 2

## 2024-07-05 MED ORDER — ROCURONIUM BROMIDE 10 MG/ML (PF) SYRINGE
PREFILLED_SYRINGE | INTRAVENOUS | Status: AC
  Filled 2024-07-05: qty 10

## 2024-07-05 MED ORDER — LIDOCAINE HCL (PF) 2 % IJ SOLN
INTRAMUSCULAR | Status: AC
  Filled 2024-07-05: qty 5

## 2024-07-05 MED ORDER — SODIUM CHLORIDE (PF) 0.9 % IJ SOLN
INTRAMUSCULAR | Status: AC
  Filled 2024-07-05: qty 20

## 2024-07-05 MED ORDER — METHOCARBAMOL 500 MG PO TABS
500.0000 mg | ORAL_TABLET | Freq: Four times a day (QID) | ORAL | 0 refills | Status: AC | PRN
  Filled 2024-07-05: qty 120, 30d supply, fill #0

## 2024-07-05 MED ORDER — PROPOFOL 500 MG/50ML IV EMUL
INTRAVENOUS | Status: DC | PRN
  Administered 2024-07-05: 100 ug/kg/min via INTRAVENOUS

## 2024-07-05 MED ORDER — BUPIVACAINE-EPINEPHRINE (PF) 0.5% -1:200000 IJ SOLN
INTRAMUSCULAR | Status: AC
  Filled 2024-07-05: qty 30

## 2024-07-05 MED ORDER — OXYCODONE HCL 5 MG PO TABS
ORAL_TABLET | ORAL | Status: AC
  Filled 2024-07-05: qty 1

## 2024-07-05 MED ORDER — VANCOMYCIN HCL IN DEXTROSE 1-5 GM/200ML-% IV SOLN
1000.0000 mg | Freq: Once | INTRAVENOUS | Status: AC
  Administered 2024-07-05: 1000 mg via INTRAVENOUS

## 2024-07-05 MED ORDER — GLYCOPYRROLATE 0.2 MG/ML IJ SOLN
INTRAMUSCULAR | Status: AC
  Filled 2024-07-05: qty 1

## 2024-07-05 MED ORDER — PROPOFOL 10 MG/ML IV BOLUS
INTRAVENOUS | Status: DC | PRN
  Administered 2024-07-05: 150 mg via INTRAVENOUS

## 2024-07-05 MED ORDER — 0.9 % SODIUM CHLORIDE (POUR BTL) OPTIME
TOPICAL | Status: DC | PRN
  Administered 2024-07-05: 500 mL

## 2024-07-05 MED ORDER — BUPIVACAINE-EPINEPHRINE (PF) 0.5% -1:200000 IJ SOLN
INTRAMUSCULAR | Status: DC | PRN
  Administered 2024-07-05: 10 mL via PERINEURAL

## 2024-07-05 MED ORDER — POLYETHYLENE GLYCOL 3350 17 GM/SCOOP PO POWD
17.0000 g | Freq: Every day | ORAL | 0 refills | Status: AC | PRN
  Filled 2024-07-05: qty 238, 14d supply, fill #0

## 2024-07-05 MED ORDER — FENTANYL CITRATE (PF) 100 MCG/2ML IJ SOLN
INTRAMUSCULAR | Status: DC | PRN
  Administered 2024-07-05 (×2): 50 ug via INTRAVENOUS

## 2024-07-05 MED ORDER — LACTATED RINGERS IV SOLN: INTRAVENOUS | Status: DC

## 2024-07-05 MED ORDER — BUPIVACAINE LIPOSOME 1.3 % IJ SUSP
INTRAMUSCULAR | Status: AC
  Filled 2024-07-05: qty 20

## 2024-07-05 MED ORDER — VANCOMYCIN HCL IN DEXTROSE 1-5 GM/200ML-% IV SOLN
INTRAVENOUS | Status: AC
  Filled 2024-07-05: qty 200

## 2024-07-05 MED ORDER — PROPOFOL 10 MG/ML IV BOLUS
INTRAVENOUS | Status: AC
  Filled 2024-07-05: qty 60

## 2024-07-05 MED ORDER — ACETAMINOPHEN 10 MG/ML IV SOLN
INTRAVENOUS | Status: AC
  Filled 2024-07-05: qty 100

## 2024-07-05 MED ORDER — FENTANYL CITRATE (PF) 100 MCG/2ML IJ SOLN
INTRAMUSCULAR | Status: AC
  Filled 2024-07-05: qty 2

## 2024-07-05 MED ORDER — ACETAMINOPHEN 10 MG/ML IV SOLN
INTRAVENOUS | Status: DC | PRN
  Administered 2024-07-05: 1000 mg via INTRAVENOUS

## 2024-07-05 MED ORDER — CHLORHEXIDINE GLUCONATE 0.12 % MT SOLN
OROMUCOSAL | Status: AC
  Filled 2024-07-05: qty 15

## 2024-07-05 MED ORDER — SENNA 8.6 MG PO TABS
1.0000 | ORAL_TABLET | Freq: Two times a day (BID) | ORAL | 0 refills | Status: AC | PRN
  Filled 2024-07-05: qty 30, 15d supply, fill #0

## 2024-07-05 MED ORDER — SODIUM CHLORIDE 0.9 % IV SOLN
INTRAVENOUS | Status: DC | PRN
  Administered 2024-07-05: .05 ug/kg/min via INTRAVENOUS

## 2024-07-05 MED ORDER — CEFAZOLIN SODIUM-DEXTROSE 2-4 GM/100ML-% IV SOLN
INTRAVENOUS | Status: AC
  Filled 2024-07-05: qty 100

## 2024-07-05 MED ORDER — OXYCODONE HCL 5 MG PO TABS
5.0000 mg | ORAL_TABLET | ORAL | 0 refills | Status: DC | PRN
  Filled 2024-07-05: qty 30, 5d supply, fill #0

## 2024-07-05 MED ORDER — OXYCODONE HCL 5 MG/5ML PO SOLN: 5.0000 mg | Freq: Once | ORAL | Status: AC | PRN

## 2024-07-05 SURGICAL SUPPLY — 35 items
BELT PT INTERSTIM MICRO SYSTEM (MISCELLANEOUS) IMPLANT
BUR NEURO DRILL SOFT 3.0X3.8M (BURR) ×1 IMPLANT
CONTROLLER HANDSET COMM KIT (NEUROSURGERY SUPPLIES) IMPLANT
DERMABOND ADVANCED .7 DNX12 (GAUZE/BANDAGES/DRESSINGS) IMPLANT
DRAPE C ARM PK CFD 31 SPINE (DRAPES) ×1 IMPLANT
DRAPE LAPAROTOMY 100X77 ABD (DRAPES) ×1 IMPLANT
DRSG OPSITE POSTOP 4X6 (GAUZE/BANDAGES/DRESSINGS) IMPLANT
DRSG OPSITE POSTOP 4X8 (GAUZE/BANDAGES/DRESSINGS) IMPLANT
ELECTRODE REM PT RTRN 9FT ADLT (ELECTROSURGICAL) ×1 IMPLANT
FEE INTRAOP CADWELL SUPPLY NCS (MISCELLANEOUS) IMPLANT
FEE INTRAOP MONITOR IMPULS NCS (MISCELLANEOUS) IMPLANT
GLOVE BIOGEL PI IND STRL 6.5 (GLOVE) ×1 IMPLANT
GLOVE SURG SYN 6.5 PF PI (GLOVE) ×1 IMPLANT
GLOVE SURG SYN 8.5 PF PI (GLOVE) ×3 IMPLANT
GOWN SRG LRG LVL 4 IMPRV REINF (GOWNS) ×1 IMPLANT
GOWN SRG XL LVL 3 NONREINFORCE (GOWNS) ×1 IMPLANT
KIT SPINAL PRONEVIEW (KITS) ×1 IMPLANT
LAVAGE JET IRRISEPT WOUND (IRRIGATION / IRRIGATOR) ×1 IMPLANT
MANIFOLD NEPTUNE II (INSTRUMENTS) ×1 IMPLANT
MARKER SKIN DUAL TIP RULER LAB (MISCELLANEOUS) ×1 IMPLANT
NDL SAFETY ECLIP 18X1.5 (MISCELLANEOUS) ×1 IMPLANT
NEUROSTIM INCEPTIV (Neuro Prosthesis/Implant) IMPLANT
NS IRRIG 500ML POUR BTL (IV SOLUTION) ×1 IMPLANT
PACK LAMINECTOMY ARMC (PACKS) ×1 IMPLANT
PROGRAMMER AND COMM CASE (NEUROSURGERY SUPPLIES) IMPLANT
RECHARGER SYSTEM (NEUROSURGERY SUPPLIES) IMPLANT
STIMULATOR CORD SURESCAN MRI (Stimulator) IMPLANT
SURGIFLO W/THROMBIN 8M KIT (HEMOSTASIS) ×1 IMPLANT
SUT SILK 2 0SH CR/8 30 (SUTURE) ×1 IMPLANT
SUT STRATA 3-0 15 PS-2 (SUTURE) ×2 IMPLANT
SUT VIC AB 0 CT1 18XCR BRD 8 (SUTURE) ×1 IMPLANT
SUT VIC AB 2-0 CT1 18 (SUTURE) ×1 IMPLANT
SYR 10ML LL (SYRINGE) IMPLANT
SYR 30ML LL (SYRINGE) ×2 IMPLANT
TRAP FLUID SMOKE EVACUATOR (MISCELLANEOUS) ×1 IMPLANT

## 2024-07-05 NOTE — Anesthesia Postprocedure Evaluation (Signed)
 Anesthesia Post Note  Patient: Randy Kennedy  Procedure(s) Performed: THORACIC LAMINECTOMY FOR SPINAL CORD STIMULATOR  Patient location during evaluation: PACU Anesthesia Type: General Level of consciousness: awake and alert Pain management: pain level controlled Vital Signs Assessment: post-procedure vital signs reviewed and stable Respiratory status: spontaneous breathing, nonlabored ventilation, respiratory function stable and patient connected to nasal cannula oxygen Cardiovascular status: blood pressure returned to baseline and stable Postop Assessment: no apparent nausea or vomiting Anesthetic complications: no   No notable events documented.   Last Vitals:  Vitals:   07/05/24 1006 07/05/24 1023  BP: 134/82 139/82  Pulse: 60 64  Resp: 14 20  Temp: (!) 36.1 C (!) 36.2 C  SpO2: 97% 98%    Last Pain:  Vitals:   07/05/24 1035  TempSrc:   PainSc: 4                  Debby Mines

## 2024-07-05 NOTE — Interval H&P Note (Signed)
 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  The various methods of treatment have been discussed with the patient and family. After consideration of risks, benefits and other options for treatment, the patient has consented to  Procedure(s) with comments: THORACIC LAMINECTOMY FOR SPINAL CORD STIMULATOR (N/A) - THORACIC LAMINECTOMY FOR SPINAL CORD STIMULATOR PLACEMENT as a surgical intervention.  The patient's history has been reviewed, patient examined, no change in status, stable for surgery.  I have reviewed the patient's chart and labs.  Questions were answered to the patient's satisfaction.    Heart sounds normal no MRG. Chest Clear to Auscultation Bilaterally.   Ruberta Holck

## 2024-07-05 NOTE — Anesthesia Procedure Notes (Addendum)
 Procedure Name: Intubation Date/Time: 07/05/2024 7:22 AM  Performed by: Colon Morna SQUIBB, RNPre-anesthesia Checklist: Patient identified, Emergency Drugs available, Suction available and Patient being monitored Patient Re-evaluated:Patient Re-evaluated prior to induction Oxygen Delivery Method: Circle system utilized Preoxygenation: Pre-oxygenation with 100% oxygen Induction Type: IV induction Ventilation: Oral airway inserted - appropriate to patient size Laryngoscope Size: Glidescope and 3 Grade View: Grade I Tube type: Oral Tube size: 7.5 mm Number of attempts: 1 Airway Equipment and Method: Stylet and Oral airway Placement Confirmation: ETT inserted through vocal cords under direct vision, positive ETCO2 and breath sounds checked- equal and bilateral Secured at: 22 cm Tube secured with: Tape Dental Injury: Teeth and Oropharynx as per pre-operative assessment  Comments: Soft bite block placed between molars for monitoring.

## 2024-07-05 NOTE — Op Note (Signed)
 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 The patient had a successful trial for spinal cord stimulation, so was consented for placement of a permanent device   Findings: successful placement of a [employer] spinal cord stimulator.   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     Preoperative Note:    Risks of surgery discussed in clinic.   Operative Note:      The patient was then brought from the preoperative center with intravenous access established.  The patient underwent general anesthesia and endotracheal tube intubation, then was rotated on the [facility] table where all pressure points were appropriately padded.  An incision was marked with flouroscopy at T9/10, and on the left flank. The skin was then thoroughly cleansed.  Perioperative antibiotic prophylaxis was administered.  Sterile prep and drapes were then applied and a timeout was then observed.     Once this was complete an incision was opened with the use of a #10 blade knife in the midline at the thoracic incision.  The paraspinus muscled were subperiosteally dissected until the laminae of T9 and T10 were visualized. Flouroscopy was used to confirm the level. A self-retaining retractor was placed.   The rongeur was used to remove the spinous process of T9.  The drill was used to thin the bone until the ligamentum flavum was visualized.  The ligamentum was then removed and the dura visualized. This was widened until placement of the paddle lead was possible.     The lead was then advanced to the T7/8 disc space at the top of the lead.  The lead was secured with a 2-0 silk suture.     The incision on the flank was then opened and a pocket formed  until it was large enough for the pulse generator.  The tunneler was used to connect between the pocket and the incision.  The lead was inserted into the tunneler and tunneled to the flank.  The leads were attached to the IPG and impedances checked.  The leads were then tightened.  The IPG was then inserted into the pouch.   Both sites were irrigated.  The wounds were closed in layers with 0 and 2-0 vicryl.  The skin was approximated with monocryl. A sterile dressing was applied.   Monitoring was stable throughout.   Patient was then rotated back to the preoperative bed awakened from anesthesia and taken to recovery. All counts are correct in this case.   I performed the entire procedure with the assistance of Edsel Goods PA as an designer, television/film set. An assistant was required for this procedure due to the complexity.  The assistant provided assistance in tissue manipulation and suction, and was required for the successful and safe performance of the procedure. I performed the critical portions of the procedure.      Reeves Daisy MD

## 2024-07-05 NOTE — Discharge Instructions (Addendum)
 NEUROSURGERY DISCHARGE INSTRUCTIONS  Admission diagnosis: Postlaminectomy syndrome, lumbar region [M96.1]  Operative procedure: Spinal cord stimulator placement  What to do after you leave the hospital:  Recommended diet: regular diet. Increase protein intake to promote wound healing.  Recommended activity: activity as tolerated and no lifting, driving, or strenuous exercise for 2 weeks. You should walk multiple times per day  Special Instructions  No straining, no heavy lifting > 10lbs x 4 weeks.  Keep incision area clean and dry. May shower in 2 days. No baths or pools for 6 weeks.  Please remove dressing tomorrow, no need to apply a bandage afterwards  You have no sutures to remove, the skin is closed with adhesive  Please take pain medications as directed. Take a stool softener if on pain medications  *Regarding compression stockings-  Please wear day and night until you are walking a couple hundred feet three times a day.   Please Report any of the following: Nausea or Vomiting, Temperature is greater than 101.39F (38.1C) degrees, Dizziness, Abdominal Pain, Difficulty Breathing or Shortness of Breath, Inability to Eat, drink Fluids, or Take medications, Bleeding, swelling, or drainage from surgical incision sites, New numbness or weakness, and Bowel or bladder dysfunction to the neurosurgeon on call. How to contact us :  If you have any questions/concerns before or after surgery, you can reach us  at [phone], or you can send a mychart message. We can be reached by phone or mychart 8am-4pm, Monday-Friday.  *Please note: Calls after 4pm are forwarded to a third party answering service. Mychart messages are not routinely monitored during evenings, weekends, and holidays. Please call our office to contact the answering service for urgent concerns during non-business hours.   Additional Follow up appointments Please follow up with Edsel Goods PA-C as scheduled in 2-3 weeks   Please  see below for scheduled appointments:  Future Appointments  Date Time Provider Department Center  07/17/2024  9:00 AM Ulis Bottcher, PA-C [facility]  08/15/2024  8:45 AM Clois Fret, MD [facility]  09/07/2024  7:50 AM [facility] DEVICE REMOTES [facility]  09/29/2024 10:30 AM Rilla Baller, MD [facility] [address]  10/09/2024  1:00 PM [facility] ANNUAL WELLNESS VISIT 1 [facility] [address]  12/07/2024  7:50 AM [facility] DEVICE REMOTES [facility]  12/15/2024  9:15 AM Abigail Bernardino HERO, PA-C [facility] None

## 2024-07-05 NOTE — Anesthesia Preprocedure Evaluation (Signed)
 Anesthesia Evaluation  Patient identified by MRN, date of birth, ID band Patient awake    Reviewed: Allergy & Precautions, NPO status , Patient's Chart, lab work & pertinent test results  History of Anesthesia Complications Negative for: history of anesthetic complications  Airway Mallampati: IV   Neck ROM: Full    Dental  (+) Missing   Pulmonary sleep apnea , former smoker   Pulmonary exam normal breath sounds clear to auscultation       Cardiovascular hypertension, Normal cardiovascular exam+ pacemaker (SSS)  Rhythm:Regular Rate:Normal  ECG 08/30/23:  Atrial-paced rhythm with prolonged AV conduction Right bundle branch block Minimal voltage criteria for LVH, may be normal variant ( R in aVL ) T wave abnormality, consider lateral ischemia When compared with ECG of 28-Feb-2022 10:44, No significant change was found   Neuro/Psych Chronic pain; HOH    GI/Hepatic 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  Hypertension:  afternoon dose of hydralazine .  Was well-controlled this morning.elevated today.  He has not taken his afternoon dose of hydralazine .  Was well-controlled this morning.   3.  Preoperative evaluation: Patient has chronic shortness of breath but no chest pain.  His activity is limited due to his back pain.  He has plans for upcoming carpal tunnel surgery.  Ejection fraction was normal in 2023.  He would be at intermediate risk for an intermediate risk procedure.  No further cardiac testing is necessary at this time.   Disposition:    Follow up with EP APP 24 months   Reproductive/Obstetrics                              Anesthesia Physical Anesthesia Plan  ASA: 3  Anesthesia Plan: General ETT   Post-op Pain Management:    Induction: Intravenous  PONV Risk Score and Plan: 2 and Ondansetron  and Dexamethasone   Airway Management Planned: Oral ETT  Additional Equipment:   Intra-op Plan:   Post-operative Plan: Extubation in OR  Informed Consent: I have reviewed the patients History and Physical, chart, labs and discussed the procedure including the risks, benefits and alternatives for the proposed anesthesia with the patient or authorized representative who has indicated his/her understanding and acceptance.     Dental Advisory Given  Plan Discussed with: Anesthesiologist, CRNA and Surgeon  Anesthesia Plan Comments: (Patient consented for risks of anesthesia including but not limited to:  - adverse reactions to medications - damage to eyes, teeth, lips or other oral mucosa - nerve damage due to positioning  - sore throat or hoarseness - Damage to heart, brain, nerves, lungs, other parts of body or loss of life  Patient voiced understanding and assent.)         Anesthesia Quick Evaluation

## 2024-07-05 NOTE — Transfer of Care (Signed)
 Immediate Anesthesia Transfer of Care Note  Patient: Randy Kennedy  Procedure(s) Performed: THORACIC LAMINECTOMY FOR SPINAL CORD STIMULATOR  Patient Location: PACU  Anesthesia Type:General  Level of Consciousness: drowsy  Airway & Oxygen Therapy: Patient Spontanous Breathing and Patient connected to face mask oxygen  Post-op Assessment: Report given to RN and Post -op Vital signs reviewed and stable  Post vital signs: Reviewed and stable  Last Vitals:  Vitals Value Taken Time  BP 114/79 07/05/24 09:33  Temp    Pulse 60 07/05/24 09:37  Resp 11 07/05/24 09:37  SpO2 99 % 07/05/24 09:37  Vitals shown include unfiled device data.  Last Pain:  Vitals:   07/05/24 0617  TempSrc: Temporal  PainSc: 6          Complications: No notable events documented.

## 2024-07-06 ENCOUNTER — Encounter: Payer: Self-pay | Admitting: Neurosurgery

## 2024-07-10 ENCOUNTER — Other Ambulatory Visit: Payer: Self-pay | Admitting: Physician Assistant

## 2024-07-10 MED ORDER — OXYCODONE HCL 5 MG PO TABS: 5.0000 mg | ORAL_TABLET | ORAL | 0 refills | Status: AC | PRN

## 2024-07-11 NOTE — Addendum Note (Signed)
 Addendum  created 07/11/24 0658 by Duwayne Craven, CRNA   Clinical Note Signed, Intraprocedure Blocks edited, SmartForm saved

## 2024-07-17 ENCOUNTER — Encounter: Admitting: Physician Assistant

## 2024-07-24 ENCOUNTER — Other Ambulatory Visit: Payer: Self-pay

## 2024-07-24 ENCOUNTER — Encounter: Payer: Self-pay | Admitting: Neurosurgery

## 2024-07-24 ENCOUNTER — Other Ambulatory Visit (HOSPITAL_COMMUNITY): Payer: Self-pay

## 2024-07-24 ENCOUNTER — Ambulatory Visit: Admitting: Neurosurgery

## 2024-07-24 VITALS — BP 136/82 | Temp 97.7°F | Ht 67.0 in | Wt 196.0 lb

## 2024-07-24 DIAGNOSIS — Z9689 Presence of other specified functional implants: Secondary | ICD-10-CM

## 2024-07-24 DIAGNOSIS — M961 Postlaminectomy syndrome, not elsewhere classified: Secondary | ICD-10-CM

## 2024-07-24 DIAGNOSIS — Z09 Encounter for follow-up examination after completed treatment for conditions other than malignant neoplasm: Secondary | ICD-10-CM

## 2024-07-24 MED ORDER — FLUTICASONE PROPIONATE 50 MCG/ACT NA SUSP: 2.0000 | Freq: Every day | NASAL | 3 refills | Status: AC

## 2024-07-24 MED ORDER — VALSARTAN 320 MG PO TABS
320.0000 mg | ORAL_TABLET | Freq: Every day | ORAL | 3 refills | Status: AC
  Filled 2024-07-24: qty 90, 90d supply, fill #0

## 2024-07-24 MED ORDER — MONTELUKAST SODIUM 10 MG PO TABS
10.0000 mg | ORAL_TABLET | Freq: Every day | ORAL | 3 refills | Status: AC
  Filled 2024-07-26 – 2024-08-28 (×3): qty 90, 90d supply, fill #0

## 2024-07-24 MED ORDER — GABAPENTIN 300 MG PO CAPS: ORAL_CAPSULE | ORAL | 3 refills | Status: AC

## 2024-07-24 MED ORDER — COLCHICINE 0.6 MG PO TABS: 0.6000 mg | ORAL_TABLET | Freq: Every day | ORAL | 3 refills | Status: AC | PRN

## 2024-07-24 MED ORDER — PRAVASTATIN SODIUM 40 MG PO TABS
40.0000 mg | ORAL_TABLET | Freq: Every day | ORAL | 3 refills | Status: AC
  Filled 2024-07-29: qty 90, 90d supply, fill #0

## 2024-07-24 MED ORDER — HYDRALAZINE HCL 100 MG PO TABS
100.0000 mg | ORAL_TABLET | Freq: Three times a day (TID) | ORAL | 3 refills | Status: AC
  Filled 2024-07-26 – 2024-07-28 (×2): qty 270, 90d supply, fill #0

## 2024-07-24 MED ORDER — ZEPBOUND 5 MG/0.5ML ~~LOC~~ SOAJ: 5.0000 mg | SUBCUTANEOUS | 1 refills | Status: AC

## 2024-07-24 MED ORDER — PROBENECID 500 MG PO TABS: 250.0000 mg | ORAL_TABLET | Freq: Two times a day (BID) | ORAL | 3 refills | Status: AC

## 2024-07-24 NOTE — Progress Notes (Signed)
" ° °  REFERRING PHYSICIAN:  Rilla Baller, Md [address] [place],  [place] [address]  DOS: 07/05/24 thoracic spinal cord stimulator   HISTORY OF PRESENT ILLNESS: Randy Kennedy is approximately 2 weeks status post placement of SCS. he is doing well. He reports incisional pain around the battery site but is not taking any medications for pain and feels it is improving significantly since surgery   PHYSICAL EXAMINATION:  General: Patient is well developed, well nourished, calm, collected, and in no apparent distress.   NEUROLOGICAL:  General: In no acute distress.   Awake, alert, oriented to person, place, and time.    Strength:            Side Iliopsoas Quads Hamstring PF DF EHL  R 5 5 5 5 5 5   L 5 5 5 5 5 5    Incision c/d/i   ROS (Neurologic):  Negative except as noted above  IMAGING: No interval imaging to review  ASSESSMENT/PLAN:  BRANDEN SHALLENBERGER is doing well approximately 2 weeks after SCS placement. We discussed activity escalation and I have advised the patient to lift up to 10 pounds until 6 weeks after surgery, then increase up to 25 pounds until 12 weeks after surgery.  After 12 weeks post-op, the patient advised to increase activity as tolerated. he will follow up with Dr. Clois in 4 weeks or sooner should there be any questions or concerns.   Edsel Goods PA-C Department of neurosurgery    "

## 2024-07-28 ENCOUNTER — Other Ambulatory Visit: Payer: Self-pay

## 2024-07-28 ENCOUNTER — Other Ambulatory Visit (HOSPITAL_COMMUNITY): Payer: Self-pay

## 2024-07-29 ENCOUNTER — Other Ambulatory Visit: Payer: Self-pay

## 2024-07-29 ENCOUNTER — Other Ambulatory Visit (HOSPITAL_COMMUNITY): Payer: Self-pay

## 2024-07-31 ENCOUNTER — Other Ambulatory Visit: Payer: Self-pay

## 2024-08-15 ENCOUNTER — Encounter: Payer: Self-pay | Admitting: Neurosurgery

## 2024-08-15 ENCOUNTER — Ambulatory Visit: Admitting: Neurosurgery

## 2024-08-15 VITALS — BP 136/84 | Temp 98.0°F | Ht 67.0 in | Wt 196.0 lb

## 2024-08-15 DIAGNOSIS — M961 Postlaminectomy syndrome, not elsewhere classified: Secondary | ICD-10-CM

## 2024-08-15 NOTE — Progress Notes (Signed)
" ° °  REFERRING PHYSICIAN:  Rilla Baller, Md [address] [place],  [place] [address]  DOS: 07/05/24 thoracic spinal cord stimulator   HISTORY OF PRESENT ILLNESS: Randy Kennedy is status post placement of SCS. he is doing well.   The device is helping him.  He does have some discomfort around the battery.  He is having some trouble with charging.     PHYSICAL EXAMINATION:  General: Patient is well developed, well nourished, calm, collected, and in no apparent distress.   NEUROLOGICAL:  General: In no acute distress.   Awake, alert, oriented to person, place, and time.    Strength:            Side Iliopsoas Quads Hamstring PF DF EHL  R 5 5 5 5 5 5   L 5 5 5 5 5 5    Incision c/d/i   ROS (Neurologic):  Negative except as noted above  IMAGING: No interval imaging to review  ASSESSMENT/PLAN:  Randy Kennedy is doing well after SCS placement.   We will work with the reps about programming and charging.  OK to return to regular activities.  Follow up PRN  Reeves Daisy MD Department of neurosurgery    "

## 2024-08-17 ENCOUNTER — Other Ambulatory Visit: Payer: Self-pay

## 2024-08-18 ENCOUNTER — Encounter: Payer: Self-pay | Admitting: Family Medicine

## 2024-08-23 ENCOUNTER — Other Ambulatory Visit: Payer: Self-pay

## 2024-08-23 ENCOUNTER — Other Ambulatory Visit (HOSPITAL_BASED_OUTPATIENT_CLINIC_OR_DEPARTMENT_OTHER): Payer: Self-pay | Admitting: Family Medicine

## 2024-08-23 ENCOUNTER — Other Ambulatory Visit: Payer: Self-pay | Admitting: Family Medicine

## 2024-08-23 MED ORDER — MINOXIDIL 2.5 MG PO TABS
2.5000 mg | ORAL_TABLET | Freq: Every day | ORAL | 0 refills | Status: AC
  Filled 2024-08-23: qty 90, 90d supply, fill #0

## 2024-08-23 MED ORDER — CARVEDILOL 25 MG PO TABS
25.0000 mg | ORAL_TABLET | Freq: Two times a day (BID) | ORAL | 0 refills | Status: AC
  Filled 2024-08-23: qty 180, 90d supply, fill #0

## 2024-08-23 MED ORDER — MELOXICAM 15 MG PO TABS
15.0000 mg | ORAL_TABLET | Freq: Every day | ORAL | 0 refills | Status: AC | PRN
  Filled 2024-08-23: qty 30, 30d supply, fill #0

## 2024-08-23 MED ORDER — NITROGLYCERIN 0.4 MG SL SUBL
0.4000 mg | SUBLINGUAL_TABLET | SUBLINGUAL | 0 refills | Status: AC | PRN
  Filled 2024-08-23: qty 25, 5d supply, fill #0

## 2024-08-23 NOTE — Telephone Encounter (Signed)
 Copied from CRM #[id number]. Topic: Clinical - Prescription Issue >> Aug 22, 2024  4:13 PM Rea ORN wrote: Reason for CRM: Pt calling be advised when his rx will be transferred to [facility]. Pt sent a message 08/18/24 requesting: minoxidil  2.5 MG tablet carvedilol  25 MG tablet nitroGLYCERIN  0.4 MG SL tablet.  Please call back to advise,  [phone]

## 2024-08-28 ENCOUNTER — Other Ambulatory Visit (HOSPITAL_COMMUNITY): Payer: Self-pay

## 2024-08-28 NOTE — Telephone Encounter (Signed)
 Noted.

## 2024-08-29 ENCOUNTER — Other Ambulatory Visit: Payer: Self-pay

## 2024-09-29 ENCOUNTER — Ambulatory Visit: Admitting: Family Medicine

## 2024-10-09 ENCOUNTER — Ambulatory Visit

## 2024-10-11 ENCOUNTER — Ambulatory Visit

## 2024-12-15 ENCOUNTER — Ambulatory Visit: Admitting: Physician Assistant
